# Patient Record
Sex: Female | Born: 1940
Health system: Southern US, Community
[De-identification: ages and names within clinical notes are randomized; demographics above are authoritative.]

## PROBLEM LIST (undated history)

## (undated) DIAGNOSIS — G63 Polyneuropathy in diseases classified elsewhere: Secondary | ICD-10-CM

## (undated) DIAGNOSIS — M2550 Pain in unspecified joint: Secondary | ICD-10-CM

## (undated) DIAGNOSIS — E785 Hyperlipidemia, unspecified: Secondary | ICD-10-CM

## (undated) DIAGNOSIS — I10 Essential (primary) hypertension: Secondary | ICD-10-CM

## (undated) DIAGNOSIS — I639 Cerebral infarction, unspecified: Secondary | ICD-10-CM

## (undated) DIAGNOSIS — G5600 Carpal tunnel syndrome, unspecified upper limb: Secondary | ICD-10-CM

## (undated) DIAGNOSIS — E039 Hypothyroidism, unspecified: Secondary | ICD-10-CM

## (undated) DIAGNOSIS — M1990 Unspecified osteoarthritis, unspecified site: Secondary | ICD-10-CM

## (undated) DIAGNOSIS — Z860100 Personal history of colon polyps, unspecified: Secondary | ICD-10-CM

## (undated) DIAGNOSIS — K6289 Other specified diseases of anus and rectum: Secondary | ICD-10-CM

## (undated) DIAGNOSIS — M129 Arthropathy, unspecified: Secondary | ICD-10-CM

## (undated) DIAGNOSIS — K222 Esophageal obstruction: Secondary | ICD-10-CM

## (undated) DIAGNOSIS — J4489 Other specified chronic obstructive pulmonary disease: Secondary | ICD-10-CM

## (undated) DIAGNOSIS — G4762 Sleep related leg cramps: Secondary | ICD-10-CM

## (undated) DIAGNOSIS — K219 Gastro-esophageal reflux disease without esophagitis: Secondary | ICD-10-CM

## (undated) DIAGNOSIS — F411 Generalized anxiety disorder: Secondary | ICD-10-CM

## (undated) DIAGNOSIS — D126 Benign neoplasm of colon, unspecified: Secondary | ICD-10-CM

## (undated) DIAGNOSIS — C50919 Malignant neoplasm of unspecified site of unspecified female breast: Secondary | ICD-10-CM

## (undated) DIAGNOSIS — K648 Other hemorrhoids: Secondary | ICD-10-CM

## (undated) DIAGNOSIS — G4730 Sleep apnea, unspecified: Secondary | ICD-10-CM

## (undated) DIAGNOSIS — Z5189 Encounter for other specified aftercare: Secondary | ICD-10-CM

## (undated) DIAGNOSIS — J449 Chronic obstructive pulmonary disease, unspecified: Secondary | ICD-10-CM

## (undated) DIAGNOSIS — R413 Other amnesia: Secondary | ICD-10-CM

## (undated) DIAGNOSIS — H269 Unspecified cataract: Secondary | ICD-10-CM

## (undated) DIAGNOSIS — B029 Zoster without complications: Secondary | ICD-10-CM

## (undated) DIAGNOSIS — M8580 Other specified disorders of bone density and structure, unspecified site: Secondary | ICD-10-CM

## (undated) HISTORY — PX: UPPER GASTROINTESTINAL ENDOSCOPY: SHX188

## (undated) HISTORY — PX: BACK SURGERY: SHX140

## (undated) HISTORY — DX: Pain in unspecified joint: M25.50

## (undated) HISTORY — PX: THYROIDECTOMY: SHX17

## (undated) HISTORY — PX: MASTECTOMY: SHX3

## (undated) HISTORY — DX: Other specified chronic obstructive pulmonary disease: J44.89

## (undated) HISTORY — DX: Other specified disorders of bone density and structure, unspecified site: M85.80

## (undated) HISTORY — DX: Unspecified cataract: H26.9

## (undated) HISTORY — DX: Benign neoplasm of colon, unspecified: D12.6

## (undated) HISTORY — DX: Other amnesia: R41.3

## (undated) HISTORY — DX: Chronic obstructive pulmonary disease, unspecified: J44.9

## (undated) HISTORY — DX: Gastro-esophageal reflux disease without esophagitis: K21.9

## (undated) HISTORY — DX: Hypothyroidism, unspecified: E03.9

## (undated) HISTORY — DX: Other hemorrhoids: K64.8

## (undated) HISTORY — PX: TUBAL LIGATION: SHX77

## (undated) HISTORY — DX: Personal history of colon polyps, unspecified: Z86.0100

## (undated) HISTORY — DX: Personal history of colonic polyps: Z86.010

## (undated) HISTORY — DX: Generalized anxiety disorder: F41.1

## (undated) HISTORY — PX: BREAST LUMPECTOMY: SHX2

## (undated) HISTORY — DX: Carpal tunnel syndrome, unspecified upper limb: G56.00

## (undated) HISTORY — DX: Arthropathy, unspecified: M12.9

## (undated) HISTORY — DX: Zoster without complications: B02.9

## (undated) HISTORY — DX: Hyperlipidemia, unspecified: E78.5

## (undated) HISTORY — DX: Malignant neoplasm of unspecified site of unspecified female breast: C50.919

## (undated) HISTORY — PX: EYE SURGERY: SHX253

## (undated) HISTORY — DX: Other specified diseases of anus and rectum: K62.89

## (undated) HISTORY — DX: Esophageal obstruction: K22.2

## (undated) HISTORY — DX: Polyneuropathy in diseases classified elsewhere: G63

## (undated) HISTORY — PX: POLYPECTOMY: SHX149

## (undated) HISTORY — DX: Encounter for other specified aftercare: Z51.89

---
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
## 1997-06-04 ENCOUNTER — Ambulatory Visit (HOSPITAL_COMMUNITY): Admission: RE | Admit: 1997-06-04 | Discharge: 1997-06-04 | Payer: Self-pay | Admitting: Oncology

## 1997-06-17 ENCOUNTER — Ambulatory Visit (HOSPITAL_COMMUNITY): Admission: RE | Admit: 1997-06-17 | Discharge: 1997-06-17 | Payer: Self-pay | Admitting: Oncology

## 1998-01-15 ENCOUNTER — Ambulatory Visit (HOSPITAL_BASED_OUTPATIENT_CLINIC_OR_DEPARTMENT_OTHER): Admission: RE | Admit: 1998-01-15 | Discharge: 1998-01-15 | Payer: Self-pay | Admitting: *Deleted

## 1998-05-16 ENCOUNTER — Other Ambulatory Visit: Admission: RE | Admit: 1998-05-16 | Discharge: 1998-05-16 | Payer: Self-pay | Admitting: *Deleted

## 1999-06-15 ENCOUNTER — Encounter (INDEPENDENT_AMBULATORY_CARE_PROVIDER_SITE_OTHER): Payer: Self-pay | Admitting: Specialist

## 1999-06-15 ENCOUNTER — Ambulatory Visit (HOSPITAL_COMMUNITY): Admission: RE | Admit: 1999-06-15 | Discharge: 1999-06-15 | Payer: Self-pay | Admitting: Gastroenterology

## 2000-06-06 ENCOUNTER — Other Ambulatory Visit: Admission: RE | Admit: 2000-06-06 | Discharge: 2000-06-06 | Payer: Self-pay | Admitting: Family Medicine

## 2000-07-12 ENCOUNTER — Encounter: Admission: RE | Admit: 2000-07-12 | Discharge: 2000-07-12 | Payer: Self-pay | Admitting: Family Medicine

## 2000-07-12 ENCOUNTER — Ambulatory Visit (HOSPITAL_COMMUNITY): Admission: RE | Admit: 2000-07-12 | Discharge: 2000-07-12 | Payer: Self-pay | Admitting: Family Medicine

## 2000-07-12 ENCOUNTER — Encounter: Payer: Self-pay | Admitting: Family Medicine

## 2001-07-13 ENCOUNTER — Encounter: Admission: RE | Admit: 2001-07-13 | Discharge: 2001-07-13 | Payer: Self-pay | Admitting: Family Medicine

## 2001-07-13 ENCOUNTER — Encounter: Payer: Self-pay | Admitting: Family Medicine

## 2001-08-30 ENCOUNTER — Other Ambulatory Visit: Admission: RE | Admit: 2001-08-30 | Discharge: 2001-08-30 | Payer: Self-pay | Admitting: Family Medicine

## 2002-01-09 ENCOUNTER — Ambulatory Visit (HOSPITAL_COMMUNITY): Admission: RE | Admit: 2002-01-09 | Discharge: 2002-01-09 | Payer: Self-pay | Admitting: Gastroenterology

## 2002-04-10 ENCOUNTER — Encounter: Payer: Self-pay | Admitting: Family Medicine

## 2002-04-10 ENCOUNTER — Ambulatory Visit (HOSPITAL_COMMUNITY): Admission: RE | Admit: 2002-04-10 | Discharge: 2002-04-10 | Payer: Self-pay | Admitting: Family Medicine

## 2002-09-07 ENCOUNTER — Other Ambulatory Visit: Admission: RE | Admit: 2002-09-07 | Discharge: 2002-09-07 | Payer: Self-pay | Admitting: Family Medicine

## 2003-09-23 ENCOUNTER — Other Ambulatory Visit: Admission: RE | Admit: 2003-09-23 | Discharge: 2003-09-23 | Payer: Self-pay | Admitting: Family Medicine

## 2004-02-03 ENCOUNTER — Ambulatory Visit: Payer: Self-pay | Admitting: Oncology

## 2004-03-05 ENCOUNTER — Ambulatory Visit (HOSPITAL_COMMUNITY): Admission: RE | Admit: 2004-03-05 | Discharge: 2004-03-05 | Payer: Self-pay | Admitting: Family Medicine

## 2004-03-13 ENCOUNTER — Ambulatory Visit (HOSPITAL_COMMUNITY): Admission: RE | Admit: 2004-03-13 | Discharge: 2004-03-13 | Payer: Self-pay | Admitting: Family Medicine

## 2004-08-05 ENCOUNTER — Ambulatory Visit: Payer: Self-pay | Admitting: Oncology

## 2004-10-07 ENCOUNTER — Other Ambulatory Visit: Admission: RE | Admit: 2004-10-07 | Discharge: 2004-10-07 | Payer: Self-pay | Admitting: Family Medicine

## 2005-08-03 ENCOUNTER — Ambulatory Visit: Payer: Self-pay | Admitting: Oncology

## 2005-09-29 ENCOUNTER — Ambulatory Visit: Payer: Self-pay | Admitting: Oncology

## 2005-10-01 LAB — CBC WITH DIFFERENTIAL/PLATELET
BASO%: 0.3 % (ref 0.0–2.0)
EOS%: 2.7 % (ref 0.0–7.0)
HCT: 36.4 % (ref 34.8–46.6)
LYMPH%: 29.1 % (ref 14.0–48.0)
MCH: 31.4 pg (ref 26.0–34.0)
MCHC: 34.3 g/dL (ref 32.0–36.0)
MCV: 91.4 fL (ref 81.0–101.0)
MONO#: 0.4 10*3/uL (ref 0.1–0.9)
NEUT%: 60.9 % (ref 39.6–76.8)
Platelets: 199 10*3/uL (ref 145–400)

## 2005-10-01 LAB — COMPREHENSIVE METABOLIC PANEL
ALT: 14 U/L (ref 0–40)
CO2: 27 mEq/L (ref 19–32)
Creatinine, Ser: 0.63 mg/dL (ref 0.40–1.20)
Total Bilirubin: 0.5 mg/dL (ref 0.3–1.2)

## 2005-10-01 LAB — LACTATE DEHYDROGENASE: LDH: 190 U/L (ref 94–250)

## 2005-11-03 ENCOUNTER — Other Ambulatory Visit: Admission: RE | Admit: 2005-11-03 | Discharge: 2005-11-03 | Payer: Self-pay | Admitting: Family Medicine

## 2006-09-29 ENCOUNTER — Ambulatory Visit: Payer: Self-pay | Admitting: Oncology

## 2006-09-30 LAB — CBC WITH DIFFERENTIAL/PLATELET
Basophils Absolute: 0 10*3/uL (ref 0.0–0.1)
Eosinophils Absolute: 0.2 10*3/uL (ref 0.0–0.5)
HCT: 36.3 % (ref 34.8–46.6)
HGB: 12.8 g/dL (ref 11.6–15.9)
MCH: 31.1 pg (ref 26.0–34.0)
MONO#: 0.4 10*3/uL (ref 0.1–0.9)
NEUT%: 61.4 % (ref 39.6–76.8)
WBC: 5.4 10*3/uL (ref 3.9–10.0)
lymph#: 1.4 10*3/uL (ref 0.9–3.3)

## 2006-09-30 LAB — LACTATE DEHYDROGENASE: LDH: 189 U/L (ref 94–250)

## 2006-09-30 LAB — COMPREHENSIVE METABOLIC PANEL
BUN: 14 mg/dL (ref 6–23)
CO2: 25 mEq/L (ref 19–32)
Calcium: 8.8 mg/dL (ref 8.4–10.5)
Chloride: 107 mEq/L (ref 96–112)
Creatinine, Ser: 0.57 mg/dL (ref 0.40–1.20)
Glucose, Bld: 97 mg/dL (ref 70–99)

## 2007-01-08 ENCOUNTER — Encounter: Payer: Self-pay | Admitting: Gastroenterology

## 2007-02-07 ENCOUNTER — Ambulatory Visit: Payer: Self-pay | Admitting: Gastroenterology

## 2007-02-07 LAB — CONVERTED CEMR LAB
ALT: 17 units/L (ref 0–35)
AST: 23 units/L (ref 0–37)
BUN: 15 mg/dL (ref 6–23)
Basophils Absolute: 0.1 10*3/uL (ref 0.0–0.1)
Bilirubin, Direct: 0.2 mg/dL (ref 0.0–0.3)
Calcium: 9.6 mg/dL (ref 8.4–10.5)
Creatinine, Ser: 0.6 mg/dL (ref 0.4–1.2)
Folate: >20 ng/mL
Glucose, Bld: 89 mg/dL (ref 70–99)
HCT: 39.7 % (ref 36.0–46.0)
Hemoglobin: 13.1 g/dL (ref 12.0–15.0)
Iron: 72 ug/dL (ref 42–145)
Monocytes Absolute: 0.5 10*3/uL (ref 0.2–0.7)
Monocytes Relative: 7.8 % (ref 3.0–11.0)
Neutrophils Relative %: 53.6 % (ref 43.0–77.0)
Platelets: 190 10*3/uL (ref 150–400)
Sodium: 140 meq/L (ref 135–145)
Transferrin: 261 mg/dL (ref >=212.0)

## 2007-02-15 ENCOUNTER — Ambulatory Visit: Payer: Self-pay | Admitting: Gastroenterology

## 2007-09-27 ENCOUNTER — Ambulatory Visit: Payer: Self-pay | Admitting: Oncology

## 2007-09-29 LAB — CBC WITH DIFFERENTIAL/PLATELET
BASO%: 0.6 % (ref 0.0–2.0)
LYMPH%: 35.6 % (ref 14.0–48.0)
MCHC: 34.3 g/dL (ref 32.0–36.0)
MCV: 90.5 fL (ref 81.0–101.0)
MONO%: 8.5 % (ref 0.0–13.0)
Platelets: 184 10*3/uL (ref 145–400)
RBC: 4.19 10*6/uL (ref 3.70–5.32)
RDW: 13.7 % (ref 11.3–14.5)
WBC: 4.1 10*3/uL (ref 3.9–10.0)

## 2007-09-29 LAB — COMPREHENSIVE METABOLIC PANEL
ALT: 15 U/L (ref 0–35)
AST: 19 U/L (ref 0–37)
Alkaline Phosphatase: 39 U/L (ref 39–117)
Sodium: 140 mEq/L (ref 135–145)
Total Bilirubin: 0.8 mg/dL (ref 0.3–1.2)
Total Protein: 6.7 g/dL (ref 6.0–8.3)

## 2007-10-16 ENCOUNTER — Encounter: Admission: RE | Admit: 2007-10-16 | Discharge: 2007-10-16 | Payer: Self-pay | Admitting: Orthopedic Surgery

## 2007-11-06 ENCOUNTER — Ambulatory Visit: Payer: Self-pay | Admitting: Cardiology

## 2007-12-13 ENCOUNTER — Ambulatory Visit: Payer: Self-pay | Admitting: Cardiology

## 2008-05-18 ENCOUNTER — Encounter: Payer: Self-pay | Admitting: Gastroenterology

## 2008-05-20 ENCOUNTER — Encounter: Payer: Self-pay | Admitting: Gastroenterology

## 2008-05-21 ENCOUNTER — Telehealth: Payer: Self-pay | Admitting: Gastroenterology

## 2008-05-22 DIAGNOSIS — R51 Headache: Secondary | ICD-10-CM | POA: Insufficient documentation

## 2008-05-22 DIAGNOSIS — E785 Hyperlipidemia, unspecified: Secondary | ICD-10-CM | POA: Insufficient documentation

## 2008-05-22 DIAGNOSIS — R1319 Other dysphagia: Secondary | ICD-10-CM | POA: Insufficient documentation

## 2008-05-22 DIAGNOSIS — K648 Other hemorrhoids: Secondary | ICD-10-CM | POA: Insufficient documentation

## 2008-05-22 DIAGNOSIS — K219 Gastro-esophageal reflux disease without esophagitis: Secondary | ICD-10-CM | POA: Insufficient documentation

## 2008-05-22 DIAGNOSIS — Z860100 Personal history of colon polyps, unspecified: Secondary | ICD-10-CM | POA: Insufficient documentation

## 2008-05-22 DIAGNOSIS — C50412 Malignant neoplasm of upper-outer quadrant of left female breast: Secondary | ICD-10-CM

## 2008-05-22 DIAGNOSIS — R519 Headache, unspecified: Secondary | ICD-10-CM | POA: Insufficient documentation

## 2008-05-24 ENCOUNTER — Ambulatory Visit: Payer: Self-pay | Admitting: Gastroenterology

## 2008-05-24 DIAGNOSIS — M129 Arthropathy, unspecified: Secondary | ICD-10-CM | POA: Insufficient documentation

## 2008-05-24 DIAGNOSIS — J449 Chronic obstructive pulmonary disease, unspecified: Secondary | ICD-10-CM

## 2008-05-24 DIAGNOSIS — K5900 Constipation, unspecified: Secondary | ICD-10-CM | POA: Insufficient documentation

## 2008-05-24 DIAGNOSIS — F411 Generalized anxiety disorder: Secondary | ICD-10-CM | POA: Insufficient documentation

## 2008-05-24 DIAGNOSIS — M818 Other osteoporosis without current pathological fracture: Secondary | ICD-10-CM | POA: Insufficient documentation

## 2008-05-29 ENCOUNTER — Ambulatory Visit: Payer: Self-pay | Admitting: Gastroenterology

## 2008-09-25 ENCOUNTER — Ambulatory Visit: Payer: Self-pay | Admitting: Oncology

## 2008-09-27 ENCOUNTER — Encounter: Payer: Self-pay | Admitting: Gastroenterology

## 2008-09-27 ENCOUNTER — Telehealth (INDEPENDENT_AMBULATORY_CARE_PROVIDER_SITE_OTHER): Payer: Self-pay | Admitting: *Deleted

## 2008-09-27 LAB — COMPREHENSIVE METABOLIC PANEL
AST: 19 U/L (ref 0–37)
Albumin: 4.5 g/dL (ref 3.5–5.2)
Alkaline Phosphatase: 46 U/L (ref 39–117)
Calcium: 9.2 mg/dL (ref 8.4–10.5)
Chloride: 106 mEq/L (ref 96–112)
Glucose, Bld: 78 mg/dL (ref 70–99)
Potassium: 4.3 mEq/L (ref 3.5–5.3)
Sodium: 140 mEq/L (ref 135–145)
Total Protein: 6.8 g/dL (ref 6.0–8.3)

## 2008-09-27 LAB — CBC WITH DIFFERENTIAL/PLATELET
Eosinophils Absolute: 0.2 10*3/uL (ref 0.0–0.5)
HGB: 12.5 g/dL (ref 11.6–15.9)
MCV: 89.7 fL (ref 79.5–101.0)
MONO#: 0.5 10*3/uL (ref 0.1–0.9)
MONO%: 9.3 % (ref 0.0–14.0)
NEUT#: 2.8 10*3/uL (ref 1.5–6.5)
RBC: 4.01 10*6/uL (ref 3.70–5.45)
RDW: 13.6 % (ref 11.2–14.5)
WBC: 4.9 10*3/uL (ref 3.9–10.3)
lymph#: 1.5 10*3/uL (ref 0.9–3.3)

## 2008-11-22 ENCOUNTER — Ambulatory Visit (HOSPITAL_BASED_OUTPATIENT_CLINIC_OR_DEPARTMENT_OTHER): Admission: RE | Admit: 2008-11-22 | Discharge: 2008-11-22 | Payer: Self-pay | Admitting: Family Medicine

## 2008-11-24 ENCOUNTER — Ambulatory Visit: Payer: Self-pay | Admitting: Internal Medicine

## 2009-03-25 ENCOUNTER — Encounter: Admission: RE | Admit: 2009-03-25 | Discharge: 2009-03-25 | Payer: Self-pay | Admitting: Family Medicine

## 2009-05-08 ENCOUNTER — Ambulatory Visit: Payer: Self-pay | Admitting: Oncology

## 2009-05-09 ENCOUNTER — Encounter: Payer: Self-pay | Admitting: Gastroenterology

## 2009-05-23 ENCOUNTER — Encounter (HOSPITAL_COMMUNITY): Admission: RE | Admit: 2009-05-23 | Discharge: 2009-08-21 | Payer: Self-pay | Admitting: Oncology

## 2009-09-25 ENCOUNTER — Ambulatory Visit: Payer: Self-pay | Admitting: Oncology

## 2009-10-27 ENCOUNTER — Ambulatory Visit: Payer: Self-pay | Admitting: Oncology

## 2009-10-27 ENCOUNTER — Encounter: Payer: Self-pay | Admitting: Gastroenterology

## 2009-10-27 LAB — COMPREHENSIVE METABOLIC PANEL
AST: 25 U/L (ref 0–37)
BUN: 19 mg/dL (ref 6–23)
CO2: 27 mEq/L (ref 19–32)
Chloride: 102 mEq/L (ref 96–112)
Potassium: 4.1 mEq/L (ref 3.5–5.3)
Total Protein: 6.8 g/dL (ref 6.0–8.3)

## 2009-10-27 LAB — CBC WITH DIFFERENTIAL/PLATELET
BASO%: 0.5 % (ref 0.0–2.0)
Basophils Absolute: 0 10*3/uL (ref 0.0–0.1)
HCT: 39.2 % (ref 34.8–46.6)
HGB: 13.4 g/dL (ref 11.6–15.9)
MCH: 31.2 pg (ref 25.1–34.0)
MCV: 91.3 fL (ref 79.5–101.0)
MONO%: 7.1 % (ref 0.0–14.0)
NEUT#: 3.5 10*3/uL (ref 1.5–6.5)
RDW: 13.5 % (ref 11.2–14.5)
lymph#: 1.6 10*3/uL (ref 0.9–3.3)

## 2010-01-13 ENCOUNTER — Encounter
Admission: RE | Admit: 2010-01-13 | Discharge: 2010-01-13 | Payer: Self-pay | Source: Home / Self Care | Attending: Orthopedic Surgery | Admitting: Orthopedic Surgery

## 2010-02-03 NOTE — Letter (Signed)
Summary: [facility]  [facility]   Imported By: Sherian Rein 11/13/2009 08:45:32  _____________________________________________________________________  External Attachment:    Type:   Image     Comment:   External Document

## 2010-02-03 NOTE — Letter (Signed)
[facility]   Imported By: Sherian Rein 06/04/2009 14:57:47  _____________________________________________________________________  External Attachment:    Type:   Image     Comment:   External Document

## 2010-02-18 ENCOUNTER — Ambulatory Visit: Payer: Medicare Other | Attending: Orthopedic Surgery | Admitting: Physical Therapy

## 2010-02-18 DIAGNOSIS — R5381 Other malaise: Secondary | ICD-10-CM | POA: Insufficient documentation

## 2010-02-18 DIAGNOSIS — M25619 Stiffness of unspecified shoulder, not elsewhere classified: Secondary | ICD-10-CM | POA: Insufficient documentation

## 2010-02-18 DIAGNOSIS — R293 Abnormal posture: Secondary | ICD-10-CM | POA: Insufficient documentation

## 2010-02-18 DIAGNOSIS — IMO0001 Reserved for inherently not codable concepts without codable children: Secondary | ICD-10-CM | POA: Insufficient documentation

## 2010-02-18 DIAGNOSIS — M25519 Pain in unspecified shoulder: Secondary | ICD-10-CM | POA: Insufficient documentation

## 2010-02-26 ENCOUNTER — Ambulatory Visit: Payer: Medicare Other | Admitting: Physical Therapy

## 2010-03-05 ENCOUNTER — Ambulatory Visit: Payer: Medicare Other | Attending: Orthopedic Surgery | Admitting: Physical Therapy

## 2010-03-05 DIAGNOSIS — R293 Abnormal posture: Secondary | ICD-10-CM | POA: Insufficient documentation

## 2010-03-05 DIAGNOSIS — M25519 Pain in unspecified shoulder: Secondary | ICD-10-CM | POA: Insufficient documentation

## 2010-03-05 DIAGNOSIS — M25619 Stiffness of unspecified shoulder, not elsewhere classified: Secondary | ICD-10-CM | POA: Insufficient documentation

## 2010-03-05 DIAGNOSIS — IMO0001 Reserved for inherently not codable concepts without codable children: Secondary | ICD-10-CM | POA: Insufficient documentation

## 2010-03-05 DIAGNOSIS — R5381 Other malaise: Secondary | ICD-10-CM | POA: Insufficient documentation

## 2010-04-28 ENCOUNTER — Other Ambulatory Visit: Payer: Self-pay | Admitting: Family Medicine

## 2010-04-28 DIAGNOSIS — Z1231 Encounter for screening mammogram for malignant neoplasm of breast: Secondary | ICD-10-CM

## 2010-05-18 ENCOUNTER — Other Ambulatory Visit: Payer: Self-pay | Admitting: Family Medicine

## 2010-05-18 ENCOUNTER — Ambulatory Visit
Admission: RE | Admit: 2010-05-18 | Discharge: 2010-05-18 | Disposition: A | Discharge status: Home / Self Care | Payer: Medicare Other | Source: Ambulatory Visit | Attending: Family Medicine | Admitting: Family Medicine

## 2010-05-18 DIAGNOSIS — Z1231 Encounter for screening mammogram for malignant neoplasm of breast: Secondary | ICD-10-CM

## 2010-05-19 NOTE — Assessment & Plan Note (Signed)
[facility]                            CARDIOLOGY OFFICE NOTE   NAME:[name], [name]                         MRN:          [id number]  DATE:12/13/2007                            DOB:          02-12-1940    PRIMARY CARE PHYSICIAN:  [name], MD   REASON FOR PRESENTATION:  Evaluate the patient with bradycardia and  fatigue.   HISTORY OF PRESENT ILLNESS:  The patient presents for followup of the  above.  At his last appointment, I did stop her atenolol to see if the  above symptoms would improve.  She states she is still fatigued.  However, she is not having any of the weak episodes which she was  having.  She thinks she is clearly improved.  Her blood pressure  actually has been going up.  She thinks it is averaging in the 166s  systolic and mid 40J diastolic.  She has not been having any  palpitations, presyncope, or syncope.  She has not been having any chest  discomfort, neck or arm discomfort.  She has had no new shortness of  breath.  Denies any PND or orthopnea.  She says that she will fall  asleep very quickly.  She does snore at night.   PAST MEDICAL HISTORY:  Hypothyroidism, breast cancer treated in 1988  with radiation chemotherapy and surgery, hyperlipidemia,  gastroesophageal reflux disease, hypertension recently diagnosed, back  surgery, mastectomy, thyroid resection as a teenager, and tubal  ligation.   ALLERGIES/INTOLERANCE:  DEMEROL, SULFA, and LIPITOR.   MEDICATIONS:  1. Synthroid 150 mcg daily.  2. Aspirin 81 mg daily.  3. Hydrochlorothiazide 12.5 mg daily.  4. Crestor 5 mg daily.  5. Actonel.  6. Calcium.  7. Fish oil.  8. Multivitamin.   REVIEW OF SYSTEMS:  As stated in the HPI and otherwise negative for  other systems.   PHYSICAL EXAMINATION:  GENERAL:  The patient is pleasant and in no  distress.  VITAL SIGNS:  Blood pressure 130/86, heart rate 73 and regular, weight  173 pounds, and body mass index 26.  HEENT:  Eyes unremarkable; pupils equal, round, and reactive to light;  fundi not visualized; oral mucosa unremarkable.  NECK:  No jugular venous distention at 45 degrees; carotid upstroke  brisk and symmetric; no bruits, no thyromegaly.  LYMPHATICS:  No cervical, axillary, or inguinal adenopathy.  LUNGS:  Clear to auscultation bilaterally.  BACK:  No costovertebral angle tenderness.  CHEST:  Unremarkable.  HEART:  PMI not displaced or sustained; S1 and S2 within normal limits;  no S3, no S4; no clicks, no rubs, no murmurs.  ABDOMEN:  Flat; positive bowel sounds, normal in frequency and pitch; no  bruits, no rebound, no guarding, no midline pulsatile mass; no  organomegaly.  SKIN:  No rashes, no nodules.  EXTREMITIES:  Pulses 2+ throughout; no edema, no cyanosis, and no  clubbing.  NEURO:  Oriented to person, place, and time; cranial nerves II through  XII grossly intact; motor grossly intact.   ASSESSMENT AND PLAN:  1. Weakness and bradycardia.  She is fatigued but  she is no longer      having the weak spells.  Her heart rate is increased.  I think      discontinuing the atenolol did help.  However, as expected, she has      had increase in her blood pressure.  2. Hypertension.  I will add lisinopril 10 mg daily.  We talked about      cough and angioedema.  She will get her BMET in 2 weeks.  3. Dyslipidemia per Dr. Christell Constant.  4. Fatigue.  The patient has fatigue.  She has daytime somnolence.      She snores.  Given all of this, a sleep study is indicated.  5. Hypothyroidism per Dr. Christell Constant.  6. Followup.  I would like to see her back in about 6 weeks or sooner      if needed.     Rollene Rotunda, MD, [facility]  Electronically Signed    JH/MedQ  DD: 12/13/2007  DT: 12/14/2007  Job #: [id number]   cc:   [name], M.D.

## 2010-05-19 NOTE — Assessment & Plan Note (Signed)
[facility]                            CARDIOLOGY OFFICE NOTE   NAME:[name], [name]                         MRN:          [id number]  DATE:11/06/2007                            DOB:          May 28, 1940    PRIMARY:  Leslie Penna, MD   REASON FOR PRESENTATION:  Bradycardia, fatigue.   HISTORY OF PRESENT ILLNESS:  The patient is a pleasant 70 year old white  female who I saw in 2004 for chest discomfort.  She had a stress  perfusion study which was negative at that time with an EF of 62%.  Apparently, she had some chest discomfort earlier this Spring and was  sent to [facility].  There she had a stress  perfusion study which she reports was normal.  She also had an  echocardiogram and said there were no significant abnormalities, though  I do not have these reports.  She was being considered for a sleep study  though I am not sure of the cause other than fatigue.   She is now requested to come back to [place].  She is most bothered by  feeling some episodes of weakness.  These will happen sporadically.  She  will feel weak all over.  The legs will be weak.  Seems to happen at  rest and is not reproducible.  She has not had any syncope, though about  a month ago she felt like she could have passed out.  Again, these are  sporadic.  Not happy in the particular time and the day.  They are  happening couple times a week.  She does not feel any rapid heart rates  though she will have an occasional skip.  She has been noted to have  persistently low heart rates.  Her blood pressures have actually been  creeping up.   The patient is active trying to walk and do yard work.  She is fatigued,  but denies any chest pressure, neck, or arm discomfort.  She has had no  shortness breath, PND, or orthopnea.   PAST MEDICAL HISTORY:  Hypothyroidism, breast cancer, diagnosed in 1998,  treated with chemotherapy, radiation, and surgery,  hyperlipidemia,  gastroesophageal reflux disease, hypertension recently.   PAST SURGICAL HISTORY:  Back surgery, mastectomy, thyroid resection as a  teenager, tubal ligation.   ALLERGIES:  Intolerance to DEMEROL, SULFA, and LIPITOR.   MEDICATIONS:  1. Synthroid 150 mcg daily.  2. Aspirin 81 mg daily.  3. Atenolol 25 mg b.i.d.  4. Crestor 5 mg daily.  5. Actonel 150 mg daily.  6. Calcium.  7. Fish oil.  8. Multivitamin.   SOCIAL HISTORY:  The patient is retired.  She is divorced.  She has 2  children.  She has never smoked cigarettes.  She does not drink alcohol.   FAMILY HISTORY:  Noncontributory for early coronary artery disease.   REVIEW OF SYSTEMS:  As stated in the HPI and otherwise negative for  other systems.   PHYSICAL EXAMINATION:  GENERAL:  The patient is in no distress.  VITAL SIGNS:  Blood pressure 156/78,  heart rate 58 and regular, weight  177 pounds, body mass index 26.  HEENT:  Eyes unremarkable.  Pupils equal, round, and reactive to light,  fundi within normal limits, oral mucosa unremarkable.  NECK:  No jugular venous distention 45 degrees, carotid upstroke brisk  and symmetric, no bruits, no thyromegaly.  LYMPHATICS:  No cervical, axillary, inguinal adenopathy.  LUNGS:  Clear to auscultation bilaterally.  BACK:  No costovertebral angle tenderness.  CHEST:  Unremarkable.  HEART:  PMI not displaced or sustained, S1 and S2 within normal limits,  no S3, no S4, no clicks, no rubs, no murmurs.  ABDOMEN:  Flat, positive bowel sounds normal in frequency and pitch, no  bruits, rebound, guarding, or midline pulsatile mass.  No hepatomegaly,  no splenomegaly.  SKIN:  No rashes, no nodules.  EXTREMITIES:  2+ pulses throughout, no edema, no cyanosis, no clubbing.  NEURO:  Oriented to person, place, and time.  Cranial nerves II through  XII grossly intact, motor grossly intact.   EKG sinus rhythm, rate 57, axis within normal limits, intervals within  normal limits,  no acute ST-wave changes.   ASSESSMENT AND PLAN:  1. Weakness and bradycardia.  The first thing to do is get rid of her      beta-blocker.  She is going to need something for blood pressure.      We will adjust as below.  When she has been off the beta-blocker      for about a month, if she is still weak, I will apply a monitor.      She will call me in the interim to let me know if she is improving      or not.  I will also check her labs done at [facility] to      make sure these include her thyroid, magnesium, and potassium.  2. Hypertension.  Her blood pressure is running slightly high.  I am      going to start hydrochlorothiazide 12.5 mg daily.  The patient back      will order a BMET.  She is going to increase potassium containing      foods.  3. Dyslipidemia per Dr. Christell Constant.  4. Hypothyroidism.  She says this has been followed and she is      euthyroid.  I will confirm this      by looking at her labs as above.  5. Followup.  We will see the patient back in [place] 1 month or      sooner if needed.     Rollene Rotunda, MD, [facility]  Electronically Signed    JH/MedQ  DD: 11/06/2007  DT: 11/07/2007  Job #: [id number]   cc:   [name], M.D.

## 2010-05-19 NOTE — Assessment & Plan Note (Signed)
[facility]                         GASTROENTEROLOGY OFFICE NOTE   NAME:[name], Agresti                         MRN:          [id number]  DATE:02/07/2007                            DOB:          1940/04/03    [name] is a 70 year old white female retiree referred through the  courtesy of Dr. Christell Constant for evaluation of chronic acid reflux with recent-  onset dysphagia.   [name] has had years of acid reflux managed with over-the-counter  Zantac.  For the past 6 months she has had recurrent episodes of rather  severe chest pain and has been evaluated by Dr. Domingo Sep in [place]  with a nuclear stress test and cardiac evaluation.  This has Spence been  normal.  She has been felt to have acid reflux and has recently been on  Prilosec with some mild improvement.  However, she continues with  regurgitation, acid reflux and pain radiating to her back.  She has had  progressive dysphagia for solids but has not had a food impaction.  She  had no anorexia or weight loss.  She does have mild chronic constipation  and occasionally will use laxatives.  She has been followed by [name].  [name], M.D., and has had several colonoscopies and due for follow-up  exam at this time.  She denies melena or hematochezia or any specific  food intolerances.  She gives no history of gallbladder disease or  hepatobiliary problems, prior hepatitis or pancreatitis.   PAST MEDICAL HISTORY:  1. The patient had breast carcinoma and prior mastectomy with chemo-      radiation therapy in 1996.  She is followed by Dr. Arline Spence and has      been in remission for some time.  2. She does have chronic sinusitis and headaches.  3. Hypercholesterolemia.  4. Had a thyroidectomy some 50 years ago and has been on chronic      Synthroid therapy.   MEDICATIONS:  1. Synthroid 150 mcg a day.  2. Atenolol 50 mg a day.  3. Aspirin 81 mg a day.  4. Actonel 35 mg a week.  5. Zantac 75 mg p.r.n.  6.  Prilosec 20 mg a day.   She in the past has had reactions to DEMEROL, SULFA, and STATINS.   FAMILY HISTORY:  Remarkable for breast cancer.  There are no other known  familial illnesses.   SOCIAL HISTORY:  She is divorced and lives by herself and is retired.  Does not smoke or use ethanol.   REVIEW OF SYSTEMS:  Remarkable for some minor complaints such as chronic  low back pain, shortness of breath on exertion, mild urinary  incontinence.  She has not menstruated since 1996.  She denies current  cardiovascular or pulmonary complaints otherwise.  Review of systems  otherwise noncontributory.   EXAMINATION:  She is a healthy-appearing white female appearing younger  than her stated age.  She is 5 feet 9-1/2 and weighs 177 pounds.  Blood pressure 150/84 and  pulse is 56 and regular.  I cannot appreciate stigmata of chronic liver disease, thyromegaly or  lymphadenopathy.  CHEST:  Clear.  She is in a regular rhythm without murmurs, gallops or rubs.  There is no hepatosplenomegaly, abdominal masses or tenderness.  Bowel  sounds are normal.  Both extremities are unremarkable.  Mental status is clear.  RECTAL:  Deferred.   ASSESSMENT:  1. Chronic gastroesophageal reflux disease with probable peptic      stricture of the esophagus, rule out Barrett's mucosa.  2. History of chronic functional constipation.  3. History of recurrent colon polyps.  4. Chronic thyroid dysfunction with previous thyroidectomy, with      chronic Synthroid use.  5. Status post mastectomy for breast carcinoma with chemo-radiation in      1996.  6. History of mild osteoporosis, on Actonel.   RECOMMENDATIONS:  1. Reflux regimen reviewed with the patient and we will place her on      Aciphex 20 mg twice a day 30 minutes before breakfast and supper.  2. Outpatient endoscopy and probable dilatation.  3. Follow up colonoscopy exam at her convenience.  4. Check screening laboratory parameters as baseline.  5.  Continue other medications as per Dr. Christell Constant.     [name]. Leslie Motto, MD, [name], FAGA  Electronically Signed    DRP/MedQ  DD: 02/07/2007  DT: 02/08/2007  Job #: [id number]   cc:   [name], M.D.  Samul Dada, M.D.

## 2010-05-22 NOTE — Op Note (Signed)
   NAME:  [name], [name]                          ACCOUNT NO.:  [id number]   MEDICAL RECORD NO.:  [id number]                   PATIENT TYPE:  AMB   LOCATION:  ENDO                                 FACILITY:  [facility]   PHYSICIAN:  John C. Madilyn Fireman, M.D.                 DATE OF BIRTH:  1940/06/27   DATE OF PROCEDURE:  01/09/2002  DATE OF DISCHARGE:                                 OPERATIVE REPORT   PROCEDURE:  Colonoscopy.   INDICATION FOR PROCEDURE:  Heme-positive stools in a patient with history of  adenomatous colon polyps.   DESCRIPTION OF PROCEDURE:  The patient was placed in the left lateral  decubitus position and placed on the pulse monitor with continuous low-flow  oxygen delivered by nasal cannula.  She was sedated with 25 mcg IV fentanyl  and 4 mg IV Versed in addition to the medicine given for the previous EGD.  The Olympus video colonoscope was inserted into the rectum and advanced to  the cecum, confirmed by transillumination at McBurney's point and  visualization of the ileocecal valve and appendiceal orifice.  The prep was  excellent.  The cecum, ascending, transverse, descending, and sigmoid colon  all appeared normal with no masses, polyps, diverticula, or other mucosal  abnormalities.  The rectum likewise appeared normal, and retroflexed view of  the anus revealed no obvious internal hemorrhoids.  The colonoscope was then  withdrawn, and the patient returned to the recovery room in stable  condition.  She tolerated the procedure well, and there were no immediate  complications.   IMPRESSION:  Normal colonoscopy.   PLAN:  Repeat colonoscopy in five years.                                               John C. Madilyn Fireman, M.D.    JCH/MEDQ  D:  01/09/2002  T:  01/09/2002  Job:  [id number]   cc:   [name], M.D.  [address] [place]  [place] [address]  Fax: [phone]

## 2010-05-22 NOTE — Op Note (Signed)
   NAME:  [name], [name]                          ACCOUNT NO.:  [id number]   MEDICAL RECORD NO.:  [id number]                   PATIENT TYPE:  AMB   LOCATION:  ENDO                                 FACILITY:  [facility]   PHYSICIAN:  John C. Madilyn Fireman, M.D.                 DATE OF BIRTH:  1940/08/21   DATE OF PROCEDURE:  01/09/2002  DATE OF DISCHARGE:                                 OPERATIVE REPORT   PROCEDURE:  Esophagogastroduodenoscopy.   INDICATION FOR PROCEDURE:  Heme-positive stools.   DESCRIPTION OF PROCEDURE:  The patient was placed in the left lateral  decubitus position and placed on the pulse monitor with continuous low-flow  oxygen delivered by nasal cannula.  She was sedated with 75 mcg IV fentanyl  and 6 mg IV Versed.  The Olympus video endoscope was advanced under direct  vision into the oropharynx and esophagus.  The esophagus was straight and of  normal caliber with the squamocolumnar line at 38 cm above a 1 cm hiatal  hernia.  There was no visible ring, stricture, esophagitis, or other  abnormality of the GE junction.  The stomach was entered, and a small amount  of liquid secretions were suctioned from the fundus.  Retroflexed view of  the cardia confirmed the small hiatal hernia and was otherwise unremarkable.  The fundus and body appeared normal.  The antrum showed some diffuse  erythema and granularity with no focal erosions or exudate consistent with  mild antral gastritis.  A CLOtest was obtained.  The pylorus was nondeformed  and easily allowed passage of the endoscope tip in the duodenum.  Both the  bulb and second portion were well-inspected and appeared to be within normal  limits.  The scope was then withdrawn, and the patient returned to the  recovery room in stable condition.  She tolerated the procedure well, and  there were no immediate complications.   IMPRESSION:  1. Mild antral gastritis.  2. A 1 cm hiatal hernia.   PLAN:  Await CLOtest and will proceed  with colonoscopy as scheduled.                                               John C. Madilyn Fireman, M.D.    JCH/MEDQ  D:  01/09/2002  T:  01/09/2002  Job:  [id number]   cc:   [name], M.D.  [address] [place]  [place] [address]  Fax: [phone]

## 2010-05-22 NOTE — Op Note (Signed)
[facility]  Patient:    [name], [name]                       MRN: [id number] Proc. Date: 06/15/99 Adm. Date:  [id number] Disc. Date: [id number] Attending:  Louie Bun CC:         Monica Becton, M.D.             Samul Dada, M.D.                           Operative Report  PROCEDURE:  Colonoscopy with polypectomy.  INDICATIONS:  History of adenomatous colon polyps on initial colonoscopy three years ago.  DESCRIPTION OF PROCEDURE:  The patient was placed in the left lateral decubitus  position and placed on the pulse monitor with continuous low flow oxygen delivered by nasal cannula.  She was sedated with 75 mcg of IV fentanyl and 6 mg of IV Versed. The Olympus video colonoscope was inserted into the rectum and advanced to the cecum, confirmed by transillumination of McBurneys and visualization of the  ileocecal valve and appendiceal orifice.  The prep was excellent.  The cecum appeared normal.  In the ascending colon, there was seen 8 x 4 mm sessile polyp  which was fulgurated by hot biopsy.  About 5 cm distal to this in the distal ascending colon was seen a 1 cm sessile polyp which was removed by snare.  The remainder of the ascending, transverse, descending, sigmoid, and rectum appeared normal without further polyps, masses, diverticuli, or other mucosal abnormalities. Retroflexed view of the anus revealed no obvious internal hemorrhoids.  The colonoscope was then withdrawn and the patient returned to the recovery room in  stable condition.  She tolerated the procedure well and there were no immediate  complications.  IMPRESSION:  Two ascending colon polyps removed with a combination of snare and  hot biopsy.  PLAN:  Await histology and will probably recommend repeat colonoscopy in three years. DD:  06/15/99 TD:  06/17/99 Job: [id number]

## 2010-06-16 ENCOUNTER — Other Ambulatory Visit: Payer: Self-pay | Admitting: Family Medicine

## 2010-06-16 DIAGNOSIS — R1011 Right upper quadrant pain: Secondary | ICD-10-CM

## 2010-06-18 LAB — IFOBT (OCCULT BLOOD): IFOBT: NEGATIVE

## 2010-06-30 ENCOUNTER — Ambulatory Visit (INDEPENDENT_AMBULATORY_CARE_PROVIDER_SITE_OTHER): Payer: Medicare Other | Admitting: Gastroenterology

## 2010-06-30 ENCOUNTER — Encounter: Payer: Self-pay | Admitting: Gastroenterology

## 2010-06-30 ENCOUNTER — Ambulatory Visit
Admission: RE | Admit: 2010-06-30 | Discharge: 2010-06-30 | Disposition: A | Discharge status: Home / Self Care | Payer: Medicare Other | Source: Ambulatory Visit | Attending: Family Medicine | Admitting: Family Medicine

## 2010-06-30 DIAGNOSIS — H9201 Otalgia, right ear: Secondary | ICD-10-CM

## 2010-06-30 DIAGNOSIS — R1011 Right upper quadrant pain: Secondary | ICD-10-CM

## 2010-06-30 DIAGNOSIS — H9209 Otalgia, unspecified ear: Secondary | ICD-10-CM

## 2010-06-30 DIAGNOSIS — R1310 Dysphagia, unspecified: Secondary | ICD-10-CM

## 2010-06-30 MED ORDER — HYOSCYAMINE SULFATE 0.125 MG SL SUBL: 0.1250 mg | SUBLINGUAL_TABLET | SUBLINGUAL | Status: AC | PRN

## 2010-06-30 NOTE — Patient Instructions (Signed)
We have made you an appt with Dr Hermelinda Medicus on 07/14/2010 you need to arrive at 12:45pm for a 1:00pm appt. His office number is [phone]. The address is [address] [place] [address]. Make sure you take a complete medication list, your insurance card and a copay if you have one.  Your Ferne Coe is scheduled for 07/03/2010 at [facility] you should arrive at 8:45am for a 9:00am appt and have nothing to eat or drink after midnight.  I have sent in Levsin to your pharmacy please take one by mouth as needed for abdominal pain. Stop by the front desk and a make a follow up appt to come back and see Dr Jarold Motto in 3 weeks.

## 2010-06-30 NOTE — Progress Notes (Signed)
This is a 70-year-old Caucasian female with continued dysphagia for solids and liquids. Been treated vigorously for acid reflux without response, and denies true reflux symptoms, and had a previous negative endoscopy several years ago. She also complains of pain in her right ear radiating into her right neck area. She otherwise has minor medical problem is doing well without anorexia or weight loss. She denies lower gastrointestinal or hepatobiliary complaints. Plan gallbladder ultrasound today which showed one 11 mm gallstone. I cannot get any symptoms of symptomatic cholelithiasis.   Current Medications, Allergies, Past Medical History, Past Surgical History, Family History and Social History were reviewed in [employer] record.  Pertinent Review of Systems Negative   Physical Exam: Awake alert no acute distress. Examination of the neck and oropharynx is unremarkable. Chest is clear cardiac exam is unremarkable. There is no hepatosplenomegaly, abdominal masses or tenderness. Bowel sounds are normal. There no gross neurological deficits. Peripheral extremities unremarkable. Mental status is normal.    Assessment and Plan: Dysphagia for solids and liquids certainly suggest a neuromuscular dysfunction of her oropharynx or upper esophagus. I've scheduled cine esophagram-barium swallow and ENT referral. She may need esophageal manometry and 24-hour pH probe testing. In the interim I will continue Prevacid with when necessary antacid use.  Please copy her primary care physician, referring physician, and pertinent subspecialists. Encounter Diagnoses  Name Primary?  Marland Kitchen Dysphagia Yes  . Right ear pain

## 2010-07-03 ENCOUNTER — Ambulatory Visit (HOSPITAL_COMMUNITY)
Admission: RE | Admit: 2010-07-03 | Discharge: 2010-07-03 | Disposition: A | Discharge status: Home / Self Care | Payer: Medicare Other | Source: Ambulatory Visit | Attending: Gastroenterology | Admitting: Gastroenterology

## 2010-07-03 DIAGNOSIS — K449 Diaphragmatic hernia without obstruction or gangrene: Secondary | ICD-10-CM | POA: Insufficient documentation

## 2010-07-03 DIAGNOSIS — R1310 Dysphagia, unspecified: Secondary | ICD-10-CM | POA: Insufficient documentation

## 2010-07-06 ENCOUNTER — Telehealth: Payer: Self-pay | Admitting: *Deleted

## 2010-07-06 NOTE — Telephone Encounter (Signed)
lmom for pt to call back

## 2010-07-06 NOTE — Telephone Encounter (Signed)
Message copied by Florene Glen on Mon Jul 06, 2010  3:54 PM ------      Message from: Mardella Layman      Created: Fri Jul 03, 2010  3:08 PM       NEEDS MANOMETRY AND 24H PH

## 2010-07-07 ENCOUNTER — Telehealth: Payer: Self-pay | Admitting: *Deleted

## 2010-07-07 NOTE — Telephone Encounter (Signed)
Pt scheduled for ESO. MANOMETRY and 24HR PH PROBE for 08/03/10 at 9am at [facility], npo after midnight, hold Prevacid. Arlys John Booking # [phone]. Notified pt and mailed her the instructions; pt stated understanding.

## 2010-07-07 NOTE — Telephone Encounter (Signed)
Message copied by Florene Glen on Tue Jul 07, 2010  2:43 PM ------      Message from: Jarold Motto, DAVID R      Created: Fri Jul 03, 2010  3:08 PM       NEEDS MANOMETRY AND 24H PH

## 2010-07-23 ENCOUNTER — Encounter: Payer: Self-pay | Admitting: Gastroenterology

## 2010-07-23 ENCOUNTER — Ambulatory Visit (INDEPENDENT_AMBULATORY_CARE_PROVIDER_SITE_OTHER): Payer: Medicare Other | Admitting: Gastroenterology

## 2010-07-23 DIAGNOSIS — R1310 Dysphagia, unspecified: Secondary | ICD-10-CM

## 2010-07-23 DIAGNOSIS — K5901 Slow transit constipation: Secondary | ICD-10-CM

## 2010-07-23 DIAGNOSIS — K219 Gastro-esophageal reflux disease without esophagitis: Secondary | ICD-10-CM

## 2010-07-23 DIAGNOSIS — R51 Headache: Secondary | ICD-10-CM

## 2010-07-23 NOTE — Progress Notes (Signed)
History of Present Illness: This is a 70 year old Caucasian female with right facial pain being evaluated by Dr. Hermelinda Medicus , ENT. The patient does have postherpetic neuralgia, and has a planned CT scan of her head. Her other problems revolve around continued dysphagia despite a negative endoscopy and barium swallow. She is on chronic PPI therapy for GERD, and denies acid reflux symptoms, anorexia or weight loss. Previous ultrasound the abdomen showed a small 11 mm gallstone it is felt to be asymptomatic. Her constipation is managed with MiraLax 8 ounces at bedtime. She is on thyroid replacement therapy and a variety of multivitamins. There is no history of anorexia, weight loss, other neurological symptomatology.    Current Medications, Allergies, Past Medical History, Past Surgical History, Family History and Social History were reviewed in [employer] record.   Assessment and plan: Mild dysphagia for pills probably reflecting a nonspecific esophageal motility disorder. Parents while it did not show any evidence of achalasia. I've scheduled her for esophageal manometry and 24-hour pH probe testing. She is to continue her ENT-neurological evaluation with Dr. Haroldine Laws. She may need neurological referral for consideration of Neurontin or Lyrica trials for her postherpetic neuralgia in her face.   Please copy her primary care physician, referring physician, and pertinent subspecialists. No diagnosis found.

## 2010-07-23 NOTE — Patient Instructions (Signed)
Please keep your scheduled appointment for esophageal manometry. We will make further disposition after that test has been completed.

## 2010-07-29 ENCOUNTER — Ambulatory Visit
Admission: RE | Admit: 2010-07-29 | Discharge: 2010-07-29 | Disposition: A | Discharge status: Home / Self Care | Payer: Medicare Other | Source: Ambulatory Visit | Attending: Otolaryngology | Admitting: Otolaryngology

## 2010-07-29 ENCOUNTER — Other Ambulatory Visit: Payer: Self-pay | Admitting: Otolaryngology

## 2010-08-03 ENCOUNTER — Ambulatory Visit (HOSPITAL_COMMUNITY)
Admission: RE | Admit: 2010-08-03 | Discharge: 2010-08-03 | Disposition: A | Discharge status: Home / Self Care | Payer: Medicare Other | Source: Ambulatory Visit | Attending: Gastroenterology | Admitting: Gastroenterology

## 2010-08-03 DIAGNOSIS — Z5309 Procedure and treatment not carried out because of other contraindication: Secondary | ICD-10-CM | POA: Insufficient documentation

## 2010-08-03 DIAGNOSIS — R1310 Dysphagia, unspecified: Secondary | ICD-10-CM | POA: Insufficient documentation

## 2011-01-01 ENCOUNTER — Other Ambulatory Visit: Payer: Self-pay | Admitting: Urology

## 2011-01-21 ENCOUNTER — Other Ambulatory Visit: Payer: Self-pay | Admitting: Physical Medicine and Rehabilitation

## 2011-01-21 DIAGNOSIS — M549 Dorsalgia, unspecified: Secondary | ICD-10-CM

## 2011-01-29 ENCOUNTER — Other Ambulatory Visit: Payer: Medicare Other

## 2011-02-02 ENCOUNTER — Ambulatory Visit
Admission: RE | Admit: 2011-02-02 | Discharge: 2011-02-02 | Disposition: A | Discharge status: Home / Self Care | Payer: Medicare Other | Source: Ambulatory Visit | Attending: Physical Medicine and Rehabilitation | Admitting: Physical Medicine and Rehabilitation

## 2011-02-02 DIAGNOSIS — M549 Dorsalgia, unspecified: Secondary | ICD-10-CM

## 2011-02-02 MED ORDER — GADOBENATE DIMEGLUMINE 529 MG/ML IV SOLN
15.0000 mL | Freq: Once | INTRAVENOUS | Status: AC | PRN
  Administered 2011-02-02: 15 mL via INTRAVENOUS

## 2011-03-27 ENCOUNTER — Emergency Department (HOSPITAL_COMMUNITY): Payer: Medicare Other

## 2011-03-27 ENCOUNTER — Emergency Department (HOSPITAL_COMMUNITY)
Admission: EM | Admit: 2011-03-27 | Discharge: 2011-03-27 | Disposition: A | Discharge status: Home / Self Care | Payer: Medicare Other | Attending: Emergency Medicine | Admitting: Emergency Medicine

## 2011-03-27 ENCOUNTER — Encounter (HOSPITAL_COMMUNITY): Payer: Self-pay | Admitting: Emergency Medicine

## 2011-03-27 DIAGNOSIS — IMO0002 Reserved for concepts with insufficient information to code with codable children: Secondary | ICD-10-CM | POA: Insufficient documentation

## 2011-03-27 DIAGNOSIS — R296 Repeated falls: Secondary | ICD-10-CM | POA: Insufficient documentation

## 2011-03-27 DIAGNOSIS — S73101A Unspecified sprain of right hip, initial encounter: Secondary | ICD-10-CM

## 2011-03-27 DIAGNOSIS — M79609 Pain in unspecified limb: Secondary | ICD-10-CM | POA: Insufficient documentation

## 2011-03-27 LAB — URINE MICROSCOPIC-ADD ON

## 2011-03-27 LAB — URINALYSIS, ROUTINE W REFLEX MICROSCOPIC
Nitrite: NEGATIVE
Protein, ur: NEGATIVE mg/dL
Specific Gravity, Urine: <1.005 — ABNORMAL LOW (ref 1.005–1.030)
Urobilinogen, UA: 0.2 mg/dL (ref 0.0–1.0)

## 2011-03-27 MED ORDER — ACETAMINOPHEN-CODEINE #3 300-30 MG PO TABS: 1.0000 | ORAL_TABLET | Freq: Four times a day (QID) | ORAL | Status: AC | PRN

## 2011-03-27 MED ORDER — ONDANSETRON HCL 4 MG PO TABS: 4.0000 mg | ORAL_TABLET | Freq: Four times a day (QID) | ORAL | Status: AC

## 2011-03-27 NOTE — ED Provider Notes (Signed)
Pt well appearing no distress Reports recent mechanical fall with pain in right LE No deformity noted BP 116/68  Pulse 74  Temp(Src) 97.6 F (36.4 C) (Oral)  Resp 20  Ht 5' 9.5" (1.765 m)  Wt 170 lb (77.111 kg)  BMI 24.74 kg/m2  SpO2 100% Xray pending at this time  Joya Gaskins, MD 03/27/11 1312

## 2011-03-27 NOTE — ED Notes (Signed)
Pt states she tripped and fell last week and the last couple of days has been c/o rt lower leg pain.

## 2011-03-27 NOTE — Discharge Instructions (Signed)
Hip Pain The hips join the upper legs to the lower pelvis. The bones, cartilage, tendons, and muscles of the hip joint perform a lot of work each day holding your body weight and allowing you to move around. Hip pain is a common symptom. It can range from a minor ache to severe pain on 1 or both hips. Pain may be felt on the inside of the hip joint near the groin, or the outside near the buttocks and upper thigh. There may be swelling or stiffness as well. It occurs more often when a person walks or performs activity. There are many reasons hip pain can develop. CAUSES  It is important to work with your caregiver to identify the cause since many conditions can impact the bones, cartilage, muscles, and tendons of the hips. Causes for hip pain include:  Broken (fractured) bones.   Separation of the thighbone from the hip socket (dislocation).   Torn cartilage of the hip joint.   Swelling (inflammation) of a tendon (tendonitis), the sac within the hip joint (bursitis), or a joint.   A weakening in the abdominal wall (hernia), affecting the nerves to the hip.   Arthritis in the hip joint or lining of the hip joint.   Pinched nerves in the back, hip, or upper thigh.   A bulging disc in the spine (herniated disc).   Rarely, bone infection or cancer.  DIAGNOSIS  The location of your hip pain will help your caregiver understand what may be causing the pain. A diagnosis is based on your medical history, your symptoms, results from your physical exam, and results from diagnostic tests. Diagnostic tests may include X-ray exams, a computerized magnetic scan (magnetic resonance imaging, MRI), or bone scan. TREATMENT  Treatment will depend on the cause of your hip pain. Treatment may include:  Limiting activities and resting until symptoms improve.   Crutches or other walking supports (a cane or brace).   Ice, elevation, and compression.   Physical therapy or home exercises.   Shoe inserts or  special shoes.   Losing weight.   Medications to reduce pain.   Undergoing surgery.  HOME CARE INSTRUCTIONS   Only take over-the-counter or prescription medicines for pain, discomfort, or fever as directed by your caregiver.   Put ice on the injured area:   Put ice in a plastic bag.   Place a towel between your skin and the bag.   Leave the ice on for 15 to 20 minutes at a time, 3 to 4 times a day.   Keep your leg raised (elevated) when possible to lessen swelling.   Avoid activities that cause pain.   Follow specific exercises as directed by your caregiver.   Sleep with a pillow between your legs on your most comfortable side.   Record how often you have hip pain, the location of the pain, and what it feels like. This information may be helpful to you and your caregiver.   Ask your caregiver about returning to work or sports and whether you should drive.   Follow up with your caregiver for further exams, therapy, or testing as directed.  SEEK MEDICAL CARE IF:   Your pain or swelling continues or worsens after 1 week.   You are feeling unwell or have chills.   You have increasing difficulty with walking.   You have a loss of sensation or other new symptoms.   You have questions or concerns.  SEEK IMMEDIATE MEDICAL CARE IF:   You   cannot put weight on the affected hip.   You have fallen.   You have a sudden increase in pain and swelling in your hip.   You have a fever.  MAKE SURE YOU:   Understand these instructions.   Will watch your condition.   Will get help right away if you are not doing well or get worse.  Document Released: 06/10/2009 Document Revised: 12/10/2010 Document Reviewed: 06/10/2009 [facility] Patient Information 2012 [place], [place].Hip Injury You have a been diagnosed with a hip injury. Falls can cause fractures to the pelvis and hip as well as very painful bruises. These are called 'hip pointers'. Muscle injuries, arthritis, sciatica, and  bursitis and can also cause severe pelvic or hip pain. An x-ray exam will usually show a fractured bone, but sometimes other studies like a CT scan or MRI may be needed to be certain about the diagnosis. All painful hip injuries should be treated like there might be a fracture until they are better or determined not to be fractures. Rest in bed over the next 3-4 days, or as long as you have severe pain. You should not bear weight on your injured hip. Use crutches or a walker. Ice packs can be applied to the injury site for 20 minutes every 2-4 hours for several days. Pain medicine may be needed to help you rest, especially at night.  A follow-up exam and further studies to determine the cause of your pain are important if your symptoms do not improve rapidly over the next week.  SEEK IMMEDIATE MEDICAL CARE IF:  You re-injure your hip.   You develop more pain, a fever, inability to walk, or other problems.  MAKE SURE YOU:   Understand these instructions.   Will watch your condition.   Will get help right away if you are not doing well or get worse.  Document Released: 01/29/2004 Document Revised: 12/10/2010 Document Reviewed: 04/11/2008 [facility] Patient Information 2012 [place], [place].

## 2011-04-02 ENCOUNTER — Emergency Department (HOSPITAL_COMMUNITY): Payer: Medicare Other

## 2011-04-02 ENCOUNTER — Emergency Department (HOSPITAL_COMMUNITY)
Admission: EM | Admit: 2011-04-02 | Discharge: 2011-04-02 | Disposition: A | Discharge status: Home / Self Care | Payer: Medicare Other | Attending: Emergency Medicine | Admitting: Emergency Medicine

## 2011-04-02 ENCOUNTER — Encounter (HOSPITAL_COMMUNITY): Payer: Self-pay | Admitting: General Practice

## 2011-04-02 DIAGNOSIS — K219 Gastro-esophageal reflux disease without esophagitis: Secondary | ICD-10-CM | POA: Insufficient documentation

## 2011-04-02 DIAGNOSIS — R51 Headache: Secondary | ICD-10-CM | POA: Insufficient documentation

## 2011-04-02 DIAGNOSIS — Z79899 Other long term (current) drug therapy: Secondary | ICD-10-CM | POA: Insufficient documentation

## 2011-04-02 DIAGNOSIS — R42 Dizziness and giddiness: Secondary | ICD-10-CM | POA: Insufficient documentation

## 2011-04-02 DIAGNOSIS — Z7982 Long term (current) use of aspirin: Secondary | ICD-10-CM | POA: Insufficient documentation

## 2011-04-02 DIAGNOSIS — I10 Essential (primary) hypertension: Secondary | ICD-10-CM | POA: Insufficient documentation

## 2011-04-02 DIAGNOSIS — J449 Chronic obstructive pulmonary disease, unspecified: Secondary | ICD-10-CM | POA: Insufficient documentation

## 2011-04-02 DIAGNOSIS — E785 Hyperlipidemia, unspecified: Secondary | ICD-10-CM | POA: Insufficient documentation

## 2011-04-02 DIAGNOSIS — E039 Hypothyroidism, unspecified: Secondary | ICD-10-CM | POA: Insufficient documentation

## 2011-04-02 DIAGNOSIS — J4489 Other specified chronic obstructive pulmonary disease: Secondary | ICD-10-CM | POA: Insufficient documentation

## 2011-04-02 DIAGNOSIS — G500 Trigeminal neuralgia: Secondary | ICD-10-CM

## 2011-04-02 DIAGNOSIS — Z853 Personal history of malignant neoplasm of breast: Secondary | ICD-10-CM | POA: Insufficient documentation

## 2011-04-02 LAB — COMPREHENSIVE METABOLIC PANEL
ALT: 18 U/L (ref 0–35)
AST: 24 U/L (ref 0–37)
Albumin: 4.1 g/dL (ref 3.5–5.2)
CO2: 29 mEq/L (ref 19–32)
Calcium: 9.8 mg/dL (ref 8.4–10.5)
Creatinine, Ser: 0.63 mg/dL (ref 0.50–1.10)
GFR calc non Af Amer: 89 mL/min — ABNORMAL LOW (ref >90)
Sodium: 137 mEq/L (ref 135–145)
Total Protein: 6.9 g/dL (ref 6.0–8.3)

## 2011-04-02 LAB — DIFFERENTIAL
Basophils Absolute: 0 10*3/uL (ref 0.0–0.1)
Basophils Relative: 0 % (ref 0–1)
Eosinophils Absolute: 0.2 10*3/uL (ref 0.0–0.7)
Eosinophils Relative: 3 % (ref 0–5)
Monocytes Absolute: 0.5 10*3/uL (ref 0.1–1.0)

## 2011-04-02 LAB — CBC
HCT: 39.5 % (ref 36.0–46.0)
MCH: 31.3 pg (ref 26.0–34.0)
MCHC: 34.7 g/dL (ref 30.0–36.0)
MCV: 90.2 fL (ref 78.0–100.0)
Platelets: 237 10*3/uL (ref 150–400)
RDW: 13.1 % (ref 11.5–15.5)
WBC: 7 10*3/uL (ref 4.0–10.5)

## 2011-04-02 MED ORDER — GABAPENTIN 300 MG PO CAPS: 300.0000 mg | ORAL_CAPSULE | Freq: Every day | ORAL | Status: DC

## 2011-04-02 NOTE — Discharge Instructions (Signed)

## 2011-04-02 NOTE — ED Notes (Signed)
Patient transported to CT 

## 2011-04-02 NOTE — Consult Note (Signed)
Requesting physician: Dr. Geoffery Lyons  Primary Care Physician: Rudi Heap, MD, MD  Reason for consultation: Headache, dizziness, evaluate for potential admission.   History of Present Illness: Very pleasant 71 y/o white woman with PMH significant for hypothyroidism, COPD, GERD and breast cancer presents to the hospital today for the above mentioned complaints. These episodes have been happening for about 1 year, but have gotten a lot worse over the winter. She says that they are very difficult to describe, but that several times a week she will have severe, sharp pain most times over her right ear going down into her jaw and sometimes around the eye that will only last for a few seconds. This only happens over the right side. She has noticed that they have become more frequent during this winter when she has been outside. She finally ended up going to the dentist because the pain in her jaw was so severe and after several visits they ended up doing a root canal in one of her teeth. Pain persisted and she returned to her dentist who told her that her pain was not coming from her teeth. Her PCP later referred her to an ENT, who told her that the problem was not in her ears either. She had an upcoming appointment with a neurologist in [facility] that she cancelled because she would prefer to keep all of her providers in [place].  When these episodes happen her blood pressure becomes very high and she feels like her heart is racing at times, especially when she is feeling very anxious. Occasionally she has become dizzy with these episodes as well. One of these episodes occurred yesterday and she finally came in to the hospital today to seek further evaluation. In the ED all of her workup has been negative, including normal labs, CT head, orthostatic vital signs. The EDP was going to send her home, however the family feels uncomfortable with this and is requesting a second opinion.  Allergies:     Allergies  Allergen Reactions  . Atorvastatin Other (See Comments)    Muscle cramps  . Meperidine Hcl Nausea And Vomiting    Makes me 'deathly' sick  . Statins     REACTION: Patient cannot tolerate due cramps  . Sulfonamide Derivatives Other (See Comments)    Unknown reaction      Past Medical History  Diagnosis Date  . Arthropathy, unspecified, site unspecified   . Anxiety state, unspecified   . Chronic airway obstruction, not elsewhere classified   . Headache   . Other and unspecified hyperlipidemia   . Malignant neoplasm of breast (female), unspecified site   . Unspecified hypothyroidism   . Personal history of colonic polyps     adenomatous  . Internal hemorrhoids without mention of complication   . Other dysphagia   . Esophageal reflux   . Pain in joint, multiple sites   . Anal or rectal pain   . Shingles   . Hiatal hernia   . Esophageal stricture     Past Surgical History  Procedure Date  . Back surgery     X2  . Mastectomy     left  . Breast lumpectomy   . Thyroidectomy   . Tubal ligation     Scheduled Meds:   Continuous Infusions:   PRN Meds:.    Social History:  reports that she has never smoked. She has never used smokeless tobacco. She reports that she does not drink alcohol or use illicit drugs.  History reviewed. No pertinent family history.  Review of Systems:  Negative except as mentioned in HPI.  Physical Exam: Blood pressure 136/81, pulse 71, temperature 98.7 F (37.1 C), temperature source Oral, resp. rate 16, weight 77.111 kg (170 lb), SpO2 96.00%. Gen: AAOx3, no acute distress. HEENT: [place], AT, PERRL, EOMI, moist mucous membranes. Neck: supple, no JVD, no lymphadenopathy, no bruits, no goiter. CV: RRR, no M/R/G. Lungs: CTA B Abdomen: S/NT/ND/+BS/no masses or organomegaly noted. Ext: no C/C/E, positive pedal pulses. Neuro: grossly intact and nonfocal.  Labs on Admission:  Results for orders placed during the hospital  encounter of 04/02/11 (from the past 48 hour(s))  CBC     Status: Normal   Collection Time   04/02/11 12:01 PM      Component Value Range Comment   WBC 7.0  4.0 - 10.5 (K/uL)    RBC 4.38  3.87 - 5.11 (MIL/uL)    Hemoglobin 13.7  12.0 - 15.0 (g/dL)    HCT 45.4  09.8 - 11.9 (%)    MCV 90.2  78.0 - 100.0 (fL)    MCH 31.3  26.0 - 34.0 (pg)    MCHC 34.7  30.0 - 36.0 (g/dL)    RDW 14.7  82.9 - 56.2 (%)    Platelets 237  150 - 400 (K/uL)   DIFFERENTIAL     Status: Normal   Collection Time   04/02/11 12:01 PM      Component Value Range Comment   Neutrophils Relative 69  43 - 77 (%)    Neutro Abs 4.8  1.7 - 7.7 (K/uL)    Lymphocytes Relative 21  12 - 46 (%)    Lymphs Abs 1.5  0.7 - 4.0 (K/uL)    Monocytes Relative 7  3 - 12 (%)    Monocytes Absolute 0.5  0.1 - 1.0 (K/uL)    Eosinophils Relative 3  0 - 5 (%)    Eosinophils Absolute 0.2  0.0 - 0.7 (K/uL)    Basophils Relative 0  0 - 1 (%)    Basophils Absolute 0.0  0.0 - 0.1 (K/uL)   COMPREHENSIVE METABOLIC PANEL     Status: Abnormal   Collection Time   04/02/11 12:01 PM      Component Value Range Comment   Sodium 137  135 - 145 (mEq/L)    Potassium 4.3  3.5 - 5.1 (mEq/L)    Chloride 102  96 - 112 (mEq/L)    CO2 29  19 - 32 (mEq/L)    Glucose, Bld 93  70 - 99 (mg/dL)    BUN 15  6 - 23 (mg/dL)    Creatinine, Ser 1.30  0.50 - 1.10 (mg/dL)    Calcium 9.8  8.4 - 10.5 (mg/dL)    Total Protein 6.9  6.0 - 8.3 (g/dL)    Albumin 4.1  3.5 - 5.2 (g/dL)    AST 24  0 - 37 (U/L)    ALT 18  0 - 35 (U/L)    Alkaline Phosphatase 53  39 - 117 (U/L)    Total Bilirubin 0.5  0.3 - 1.2 (mg/dL)    GFR calc non Af Amer 89 (*) >90 (mL/min)    GFR calc Af Amer >90  >90 (mL/min)     Radiological Exams on Admission: Ct Head Wo Contrast  04/02/2011  *RADIOLOGY REPORT*  Clinical Data: Nausea, dizziness, right facial/arm numbness  CT HEAD WITHOUT CONTRAST  Technique:  Contiguous axial images were obtained from the base of  the skull through the vertex without  contrast.  Comparison: None.  Findings: No evidence of parenchymal hemorrhage or extra-axial fluid collection. No mass lesion, mass effect, or midline shift.  No CT evidence of acute infarction.  Cerebral volume is age appropriate.  No ventriculomegaly.  Mild intracranial atherosclerosis.  The visualized paranasal sinuses are essentially clear. The mastoid air cells are unopacified.  No evidence of calvarial fracture.  IMPRESSION: No evidence of acute intracranial abnormality.  Original Report Authenticated By: Charline Bills, M.D.    Assessment/Plan  1. Trigeminal Neuralgia: I think this diagnosis most likely explains her episodes. The fact that they have worsened in the winter with exposure to cold air is also key in this diagnosis. She needs to followup with neurology. She would likely benefit from carbamazepine or a similar anticonvulsant, but I would feel more comfortable if neurology prescribed this. In the meantime, I will give her a prescription for neurontin. I believe it is safe to discharge her home from the ED today. Patient knows about importance of close followup with neurology. I have also given her some patient information from [employer] to Date about Trigeminal Neuralgia, and after she has read it, she really thinks this is her diagnosis as well.  Time Spent on Consultation: 60 minutes.  HERNANDEZ ACOSTA,Shuayb Schepers [facility]  [phone] 04/02/2011, 4:04 PM

## 2011-04-02 NOTE — ED Provider Notes (Addendum)
History     CSN: [id number]  Arrival date & time 04/02/11  1126   First MD Initiated Contact with Patient 04/02/11 1147      Chief Complaint  Patient presents with  . Dizziness    Nauses, dizziness noon yesterday, Bp 190s, Vomiting, Hip Pain, Took Asiprin and Ibuprofen and felt better, TODAY- leg sore, head/jaw pain "sharp pain from temple, 141/88 HR 70s, 9am acute onset of right side weakness, dizziness, nausea    (Consider location/radiation/quality/duration/timing/severity/associated sxs/prior treatment) HPI Comments: Patient states for the past year she has episodes of headache, dizziness, hypertension.  She has seen ent for this but did not give reason.  Has an upcoming appointment with Neurology.    Patient is a 71 y.o. female presenting with headaches. The history is provided by the patient.  Headache  This is a recurrent problem. The current episode started 1 to 2 hours ago. Episode frequency: once weekly or so. The problem has been resolved. Associated with: Pain in the right side of the head.    Past Medical History  Diagnosis Date  . Arthropathy, unspecified, site unspecified   . Anxiety state, unspecified   . Chronic airway obstruction, not elsewhere classified   . Headache   . Other and unspecified hyperlipidemia   . Malignant neoplasm of breast (female), unspecified site   . Unspecified hypothyroidism   . Personal history of colonic polyps     adenomatous  . Internal hemorrhoids without mention of complication   . Other dysphagia   . Esophageal reflux   . Pain in joint, multiple sites   . Anal or rectal pain   . Shingles   . Hiatal hernia   . Esophageal stricture     Past Surgical History  Procedure Date  . Back surgery     X2  . Mastectomy     left  . Breast lumpectomy   . Thyroidectomy   . Tubal ligation     History reviewed. No pertinent family history.  History  Substance Use Topics  . Smoking status: Never Smoker   . Smokeless tobacco:  Never Used  . Alcohol Use: No    OB History    Grav Para Term Preterm Abortions TAB SAB Ect Mult Living                  Review of Systems  Neurological: Positive for headaches.  All other systems reviewed and are negative.    Allergies  Atorvastatin; Meperidine hcl; Statins; and Sulfonamide derivatives  Home Medications   Current Outpatient Rx  Name Route Sig Dispense Refill  . ACETAMINOPHEN-CODEINE #3 300-30 MG PO TABS Oral Take 1-2 tablets by mouth every 6 (six) hours as needed for pain. 24 tablet 0  . ASPIRIN 81 MG PO TABS Oral Take 81 mg by mouth daily.      Marland Kitchen CALCIUM CARBONATE-VITAMIN D 500-125 MG-UNIT PO TABS Oral Take 1 capsule by mouth daily.      Marland Kitchen VITAMIN D3 2000 UNITS PO CAPS Oral Take 2,000 Units by mouth daily.      Marland Kitchen EZETIMIBE 10 MG PO TABS Oral Take 10 mg by mouth daily.      Marland Kitchen HYDROCHLOROTHIAZIDE 12.5 MG PO CAPS Oral Take 12.5 mg by mouth daily.      Marland Kitchen LANSOPRAZOLE 15 MG PO CPDR Oral Take 15 mg by mouth as needed. For heartburn    . LEVOTHYROXINE SODIUM 125 MCG PO TABS Oral Take 125 mcg by mouth daily.      Marland Kitchen  MULTIVITAMINS PO CAPS Oral Take 1 capsule by mouth daily.      Marland Kitchen FISH OIL 1000 MG PO CAPS Oral Take 1 capsule by mouth daily.      Marland Kitchen ONDANSETRON HCL 4 MG PO TABS Oral Take 1 tablet (4 mg total) by mouth every 6 (six) hours. Prn nausea 12 tablet 0  . POLYETHYLENE GLYCOL 3350 PO POWD Oral Take 17 g by mouth daily.        BP 136/70  Pulse 67  Temp(Src) 98.7 F (37.1 C) (Oral)  Resp 17  Wt 170 lb (77.111 kg)  SpO2 100%  Physical Exam  Nursing note and vitals reviewed. Constitutional: She is oriented to person, place, and time. She appears well-developed and well-nourished. No distress.  HENT:  Head: Normocephalic and atraumatic.  Right Ear: External ear normal.  Left Ear: External ear normal.  Neck: Normal range of motion. Neck supple.  Cardiovascular: Normal rate and regular rhythm.  Exam reveals no gallop and no friction rub.   No murmur  heard. Pulmonary/Chest: Effort normal and breath sounds normal. No respiratory distress. She has no wheezes.  Abdominal: Soft. Bowel sounds are normal. She exhibits no distension. There is no tenderness.  Musculoskeletal: Normal range of motion.  Neurological: She is alert and oriented to person, place, and time. No cranial nerve deficit. She exhibits normal muscle tone. Coordination normal.  Skin: Skin is warm and dry. She is not diaphoretic.    ED Course  Procedures (including critical care time)   Labs Reviewed  CBC  DIFFERENTIAL  COMPREHENSIVE METABOLIC PANEL   No results found.   No diagnosis found.    MDM  The CT looks okay.  There are no abnormalities with the labs.  I discussed this situation with the family and they are very uncomfortable with her returning home.  I have consulted medicine for their opinion regarding whether she requires admission or not.    She was evaluated by Medicine and they suspect trigeminal neuralgia.  They will prescribe neurontin and she will be discharged as I had originally planned.        Geoffery Lyons, MD 04/02/11 1356  Geoffery Lyons, MD 04/03/11 0111

## 2011-04-02 NOTE — ED Notes (Signed)
MD at bedside. 

## 2011-04-02 NOTE — ED Notes (Signed)
Pt returned from CT °

## 2011-04-02 NOTE — ED Notes (Signed)
ZOX:[id number]<AV> Expected date:04/02/11<BR> Expected time:<BR> Means of arrival:<BR> Comments:<BR> EMS AC - n/v/breast cancer

## 2011-04-22 NOTE — ED Provider Notes (Signed)
History     CSN: [id number]  Arrival date & time 03/27/11  1059   First MD Initiated Contact with Patient 03/27/11 1156      Chief Complaint  Patient presents with  . Leg Pain    (Consider location/radiation/quality/duration/timing/severity/associated sxs/prior treatment) HPI Comments: Patient c/o pain to the right hip and leg for several days.  States pain began after she fell, landing on her hip.  Pain is worse with weight bearing and movement and improves with rest.  She denies numbness or weakness, back or neck pain or LOC.  Patient is a 71 y.o. female presenting with leg pain. The history is provided by the patient.  Leg Pain  The incident occurred more than 1 week ago. The incident occurred at home. The injury mechanism was a fall. The pain is present in the right hip and right leg. The quality of the pain is described as aching. The pain is moderate. The pain has been constant since onset. Pertinent negatives include no numbness, no inability to bear weight, no loss of motion, no muscle weakness, no loss of sensation and no tingling. She reports no foreign bodies present. The symptoms are aggravated by activity, palpation and bearing weight. She has tried nothing for the symptoms. The treatment provided no relief.    Past Medical History  Diagnosis Date  . Arthropathy, unspecified, site unspecified   . Anxiety state, unspecified   . Chronic airway obstruction, not elsewhere classified   . Headache   . Other and unspecified hyperlipidemia   . Malignant neoplasm of breast (female), unspecified site   . Unspecified hypothyroidism   . Personal history of colonic polyps     adenomatous  . Internal hemorrhoids without mention of complication   . Other dysphagia   . Esophageal reflux   . Pain in joint, multiple sites   . Anal or rectal pain   . Shingles   . Hiatal hernia   . Esophageal stricture     Past Surgical History  Procedure Date  . Back surgery     X2  .  Mastectomy     left  . Breast lumpectomy   . Thyroidectomy   . Tubal ligation     History reviewed. No pertinent family history.  History  Substance Use Topics  . Smoking status: Never Smoker   . Smokeless tobacco: Never Used  . Alcohol Use: No    OB History    Grav Para Term Preterm Abortions TAB SAB Ect Mult Living                  Review of Systems  Constitutional: Negative for fever.  HENT: Negative for neck pain and neck stiffness.   Musculoskeletal: Positive for arthralgias. Negative for myalgias, back pain and joint swelling.  Skin: Negative.   Neurological: Negative for tingling, weakness, numbness and headaches.  All other systems reviewed and are negative.    Allergies  Atorvastatin; Meperidine hcl; Statins; and Sulfonamide derivatives  Home Medications   Current Outpatient Rx  Name Route Sig Dispense Refill  . ASPIRIN 81 MG PO TABS Oral Take 81 mg by mouth daily.      Marland Kitchen CALCIUM CARBONATE-VITAMIN D 500-125 MG-UNIT PO TABS Oral Take 1 capsule by mouth daily.      Marland Kitchen VITAMIN D3 2000 UNITS PO CAPS Oral Take 2,000 Units by mouth daily.      Marland Kitchen EZETIMIBE 10 MG PO TABS Oral Take 10 mg by mouth daily.      Marland Kitchen  HYDROCHLOROTHIAZIDE 12.5 MG PO CAPS Oral Take 12.5 mg by mouth daily.      Marland Kitchen LANSOPRAZOLE 15 MG PO CPDR Oral Take 15 mg by mouth as needed. For heartburn    . LEVOTHYROXINE SODIUM 125 MCG PO TABS Oral Take 125 mcg by mouth daily.      . MULTIVITAMINS PO CAPS Oral Take 1 capsule by mouth daily.      Marland Kitchen FISH OIL 1000 MG PO CAPS Oral Take 1 capsule by mouth daily.      Marland Kitchen POLYETHYLENE GLYCOL 3350 PO POWD Oral Take 17 g by mouth daily.      . ASPIRIN 325 MG PO TABS Oral Take 750 mg by mouth daily.    Marland Kitchen GABAPENTIN 300 MG PO CAPS Oral Take 1 capsule (300 mg total) by mouth at bedtime. 30 capsule 1  . GABAPENTIN 300 MG PO CAPS Oral Take 1 capsule (300 mg total) by mouth at bedtime. 30 capsule 1  . IBUPROFEN 200 MG PO TABS Oral Take 400 mg by mouth every 6 (six) hours  as needed.      BP 117/72  Pulse 77  Temp(Src) 97.6 F (36.4 C) (Oral)  Resp 18  Ht 5' 9.5" (1.765 m)  Wt 170 lb (77.111 kg)  BMI 24.74 kg/m2  SpO2 100%  Physical Exam  Nursing note and vitals reviewed. Constitutional: She is oriented to person, place, and time. She appears well-developed and well-nourished. No distress.  HENT:  Head: Normocephalic and atraumatic.  Neck: Normal range of motion. Neck supple.  Cardiovascular: Normal rate, regular rhythm and intact distal pulses.   No murmur heard. Pulmonary/Chest: Effort normal and breath sounds normal.  Musculoskeletal: She exhibits tenderness. She exhibits no edema.       Right hip: She exhibits tenderness. She exhibits normal range of motion, normal strength, no bony tenderness, no swelling, no crepitus, no deformity and no laceration.       Legs: Neurological: She is alert and oriented to person, place, and time. No cranial nerve deficit or sensory deficit. She exhibits normal muscle tone. Coordination normal.  Reflex Scores:      Patellar reflexes are 2+ on the right side and 2+ on the left side.      Achilles reflexes are 2+ on the right side and 2+ on the left side. Skin: Skin is warm and dry.    ED Course  Procedures (including critical care time)  Results for orders placed during the hospital encounter of 03/27/11  URINALYSIS, ROUTINE W REFLEX MICROSCOPIC      Component Value Range   Color, Urine YELLOW  YELLOW    APPearance CLEAR  CLEAR    Specific Gravity, Urine <1.005 (*) 1.005 - 1.030    pH 6.5  5.0 - 8.0    Glucose, UA NEGATIVE  NEGATIVE (mg/dL)   Hgb urine dipstick NEGATIVE  NEGATIVE    Bilirubin Urine NEGATIVE  NEGATIVE    Ketones, ur NEGATIVE  NEGATIVE (mg/dL)   Protein, ur NEGATIVE  NEGATIVE (mg/dL)   Urobilinogen, UA 0.2  0.0 - 1.0 (mg/dL)   Nitrite NEGATIVE  NEGATIVE    Leukocytes, UA SMALL (*) NEGATIVE   URINE MICROSCOPIC-ADD ON      Component Value Range   Squamous Epithelial / LPF RARE  RARE      WBC, UA 3-6  <3 (WBC/hpf)    Dg Pelvis 1-2 Views  03/27/2011  *RADIOLOGY REPORT*  Clinical Data: Fall 1 week ago.  Hip pain, especially with weightbearing.  PELVIS - 1-2 VIEW  Comparison: Right hip films same date.  Lumbar spine films of 05/23/2009.  Findings: Both femoral heads are located.  Mild joint space narrowing involves the weightbearing surface of bilateral hips. Sacroiliac joints are symmetric.  Degenerative disc disease is eccentric left L4-L5.  IMPRESSION: No acute osseous abnormality.  Original Report Authenticated By: Consuello Bossier, M.D.   Dg Hip Complete Right  03/27/2011  *RADIOLOGY REPORT*  Clinical Data: Trauma 1 week ago with pain.  RIGHT HIP - COMPLETE 2+ VIEW  Comparison: Pelvic films same date  Findings: No acute fracture.  Soft tissue calcification projects lateral of the greater trochanter of the proximal femur.  Mild joint space and involves the weightbearing surface of the hip.  IMPRESSION: No acute osseous abnormality.  Original Report Authenticated By: Consuello Bossier, M.D.    1. Sprain of right hip       MDM    Patient is ambulates with a slow but steady gait.  No focal neuro deficits.  Agrees to f/u with her PMD or return here if needed   Patient / Family / Caregiver understand and agree with initial ED impression and plan with expectations set for ED visit. Pt stable in ED with no significant deterioration in condition. Pt feels improved after observation and/or treatment in ED.     Eileen Kangas L. Lindzy Rupert, [place] 04/22/11 1353

## 2011-04-23 NOTE — ED Provider Notes (Signed)
Medical screening examination/treatment/procedure(s) were conducted as a shared visit with non-physician practitioner(s) and myself.  I personally evaluated the patient during the encounter   Joya Gaskins, MD 04/23/11 [id number]

## 2011-05-12 ENCOUNTER — Other Ambulatory Visit: Payer: Self-pay | Admitting: Family Medicine

## 2011-05-12 DIAGNOSIS — Z139 Encounter for screening, unspecified: Secondary | ICD-10-CM

## 2011-05-20 ENCOUNTER — Ambulatory Visit (HOSPITAL_COMMUNITY)
Admission: RE | Admit: 2011-05-20 | Discharge: 2011-05-20 | Disposition: A | Discharge status: Home / Self Care | Payer: Medicare Other | Source: Ambulatory Visit | Attending: Family Medicine | Admitting: Family Medicine

## 2011-05-20 DIAGNOSIS — Z1231 Encounter for screening mammogram for malignant neoplasm of breast: Secondary | ICD-10-CM | POA: Insufficient documentation

## 2011-05-20 DIAGNOSIS — Z139 Encounter for screening, unspecified: Secondary | ICD-10-CM

## 2012-04-13 ENCOUNTER — Other Ambulatory Visit: Payer: Self-pay | Admitting: Family Medicine

## 2012-04-14 NOTE — Telephone Encounter (Signed)
Patient last seen for chronic health check on 10-27-11. Last acute visit on 01-10-12. Please advise. No follow up appt currently scheuled. Thank you

## 2012-04-14 NOTE — Telephone Encounter (Signed)
Please give patient an appointment to be seen when possible.

## 2012-04-25 ENCOUNTER — Telehealth: Payer: Self-pay | Admitting: Family Medicine

## 2012-04-26 ENCOUNTER — Ambulatory Visit (INDEPENDENT_AMBULATORY_CARE_PROVIDER_SITE_OTHER): Payer: Medicare Other

## 2012-04-26 DIAGNOSIS — E538 Deficiency of other specified B group vitamins: Secondary | ICD-10-CM

## 2012-04-26 MED ORDER — CYANOCOBALAMIN 1000 MCG/ML IJ SOLN
1000.0000 ug | Freq: Once | INTRAMUSCULAR | Status: AC
  Administered 2012-04-26: 1000 ug via INTRAMUSCULAR

## 2012-04-26 NOTE — Telephone Encounter (Signed)
Bilateral lower extremity swelling and tingling since last week.  No improvement with elevation.   Denies dyspnea.   Takes HCTZ 12.5mg  daily.   Has follow-up appt with Dr. Christell Constant on 05/17/12.  She wanted to know if there is anything else that can be done.

## 2012-04-27 NOTE — Telephone Encounter (Signed)
Returned [employer].Klingel Gross not available i advised she would call her back

## 2012-04-28 NOTE — Telephone Encounter (Signed)
LMOM

## 2012-05-01 ENCOUNTER — Other Ambulatory Visit: Payer: Self-pay | Admitting: Family Medicine

## 2012-05-01 DIAGNOSIS — Z139 Encounter for screening, unspecified: Secondary | ICD-10-CM

## 2012-05-03 NOTE — Telephone Encounter (Signed)
LMOM

## 2012-05-17 ENCOUNTER — Encounter: Payer: Self-pay | Admitting: Family Medicine

## 2012-05-17 ENCOUNTER — Ambulatory Visit (INDEPENDENT_AMBULATORY_CARE_PROVIDER_SITE_OTHER): Payer: Medicare Other | Admitting: Family Medicine

## 2012-05-17 VITALS — BP 127/79 | HR 86 | Temp 97.0°F | Ht 69.5 in | Wt 174.2 lb

## 2012-05-17 DIAGNOSIS — G629 Polyneuropathy, unspecified: Secondary | ICD-10-CM

## 2012-05-17 DIAGNOSIS — E785 Hyperlipidemia, unspecified: Secondary | ICD-10-CM

## 2012-05-17 DIAGNOSIS — M2550 Pain in unspecified joint: Secondary | ICD-10-CM

## 2012-05-17 DIAGNOSIS — E039 Hypothyroidism, unspecified: Secondary | ICD-10-CM

## 2012-05-17 DIAGNOSIS — G589 Mononeuropathy, unspecified: Secondary | ICD-10-CM

## 2012-05-17 DIAGNOSIS — D72829 Elevated white blood cell count, unspecified: Secondary | ICD-10-CM

## 2012-05-17 DIAGNOSIS — E559 Vitamin D deficiency, unspecified: Secondary | ICD-10-CM

## 2012-05-17 LAB — HEPATIC FUNCTION PANEL
AST: 28 U/L (ref 0–37)
Alkaline Phosphatase: 65 U/L (ref 39–117)
Bilirubin, Direct: 0.1 mg/dL (ref 0.0–0.3)
Indirect Bilirubin: 0.4 mg/dL (ref 0.0–0.9)
Total Bilirubin: 0.5 mg/dL (ref 0.3–1.2)

## 2012-05-17 LAB — BASIC METABOLIC PANEL WITH GFR
BUN: 27 mg/dL — ABNORMAL HIGH (ref 6–23)
CO2: 27 mEq/L (ref 19–32)
Calcium: 9.6 mg/dL (ref 8.4–10.5)
Chloride: 101 mEq/L (ref 96–112)
Creat: 0.59 mg/dL (ref 0.50–1.10)

## 2012-05-17 LAB — POCT CBC
Lymph, poc: 1.4 (ref 0.6–3.4)
MCH, POC: 30.7 pg (ref 27–31.2)
MCHC: 33.6 g/dL (ref 31.8–35.4)
MCV: 91.3 fL (ref 80–97)
Platelet Count, POC: 286 10*3/uL (ref 142–424)
RBC: 4.1 M/uL (ref 4.04–5.48)

## 2012-05-17 LAB — THYROID PANEL WITH TSH
Free Thyroxine Index: 4.5 — ABNORMAL HIGH (ref 1.0–3.9)
T3 Uptake: 39 % — ABNORMAL HIGH (ref 22.5–37.0)
TSH: 0.252 u[IU]/mL — ABNORMAL LOW (ref 0.350–4.500)

## 2012-05-17 NOTE — Progress Notes (Signed)
Subjective:    Patient ID: [name], female    DOB: November 30, 1940, 72 y.o.   MRN: [id number]  HPI This patient presents for recheck of multiple medical problems. No one accompanies the patient today.  Patient Active Problem List   Diagnosis Date Noted  . Trigeminal neuralgia 04/02/2011  . Dysphagia 07/23/2010  . GERD (gastroesophageal reflux disease) 07/23/2010  . Right facial pain 07/23/2010  . ANXIETY 05/24/2008  . COPD 05/24/2008  . CONSTIPATION 05/24/2008  . ARTHRITIS 05/24/2008  . IDIOPATHIC OSTEOPOROSIS 05/24/2008  . ADENOCARCINOMA, BREAST 05/22/2008  . HYPOTHYROIDISM 05/22/2008  . HYPERLIPIDEMIA 05/22/2008  . INTERNAL HEMORRHOIDS 05/22/2008  . GERD 05/22/2008  . HEADACHE 05/22/2008  . OTHER DYSPHAGIA 05/22/2008  . COLONIC POLYPS, ADENOMATOUS, HX OF 05/22/2008    In addition, she complains of allergy symptoms and myalgia. She recently we had a flare of her trigeminal neuralgia and at the same time developed an abscessed tooth on the right. She sees a neurologist in [place]. The dentist is following the abscessed tooth. She is currently on an antibiotic for the tooth.  The allergies, current medications, past medical history, surgical history, family and social history are reviewed.  Immunizations reviewed.  Health maintenance reviewed.  The following items are outstanding: T-Dap       Review of Systems  HENT: Positive for ear pain, congestion and postnasal drip.   Eyes: Positive for redness (R eye).  Respiratory: Positive for cough (dry).   Cardiovascular: Positive for leg swelling (bilateral).  Endocrine: Negative.   Genitourinary: Positive for frequency. Negative for dysuria.  Musculoskeletal: Positive for myalgias (lower legs, cramping), back pain (cervical , LBP) and arthralgias (knees).  Neurological: Negative.   Psychiatric/Behavioral: Positive for sleep disturbance (due to meds).       Objective:   Physical Exam BP 127/79  Pulse 86   Temp(Src) 97 F (36.1 C) (Oral)  Ht 5' 9.5" (1.765 m)  Wt 174 lb 3.2 oz (79.017 kg)  BMI 25.36 kg/m2  The patient appeared well nourished and normally developed, alert and oriented to time and place. Speech, behavior and judgement appear normal. Vital signs as documented.  Head exam is unremarkable. No scleral icterus or pallor noted. She has an abscessed tooth that has recently been drained her right lower gum. She had difficulty opening her mouth because of this abscess. There is head congestion bilaterally.  Neck is without jugular venous distension, thyromegally, or carotid bruits. Carotid upstrokes are brisk bilaterally. No cervical adenopathy. Lungs are clear anteriorly and posteriorly to auscultation. Normal respiratory effort. No lymph nodes palpable in the axillary regions bilaterally. Cardiac exam reveals regular rate and rhythm at 72 per minute. First and second heart sounds normal.  No murmurs, rubs or gallops.  Abdominal exam reveals normal bowl sounds, no masses, no organomegaly and no aortic enlargement. No inguinal adenopathy. Extremities are nonedematous and both femoral and pedal pulses are normal. Skin without pallor or jaundice.  Warm and dry, without rash. Neurologic exam reveals normal deep tendon reflexes and normal sensation.          Assessment & Plan:  1. Hypothyroid - Thyroid Panel With TSH  2. Neuropathy - BASIC METABOLIC PANEL WITH GFR; Standing - BASIC METABOLIC PANEL WITH GFR  3. Hyperlipemia - NMR Lipoprofile with Lipids; Standing - Hepatic function panel; Standing - NMR Lipoprofile with Lipids - Hepatic function panel  4. Arthralgia - BASIC METABOLIC PANEL WITH GFR; Standing - BASIC METABOLIC PANEL WITH GFR  5. Elevated white blood cell count -  POCT CBC; Standing - POCT CBC  6. Vitamin D deficiency - Vitamin D 25 hydroxy; Standing - Vitamin D 25 hydroxy  Patient Instructions  Consider trying Nasacort AQ over-the-counter 1-2 sprays  each nostril daily Can also use either Allegra, Zyrtec, or Claritin Always be careful and don't climb so you won't fall You continue therapeutic lifestyle changes as aggressive as possible, this means diet and exercise

## 2012-05-17 NOTE — Patient Instructions (Signed)
Consider trying Nasacort AQ over-the-counter 1-2 sprays each nostril daily Can also use either Allegra, Zyrtec, or Claritin Always be careful and don't climb so you won't fall You continue therapeutic lifestyle changes as aggressive as possible, this means diet and exercise

## 2012-05-19 LAB — NMR LIPOPROFILE WITH LIPIDS
HDL Particle Number: 35.8 umol/L (ref >=30.5)
HDL-C: 55 mg/dL (ref >=40)
Large HDL-P: 8.6 umol/L (ref >=4.8)
Triglycerides: 88 mg/dL (ref <150)

## 2012-05-25 ENCOUNTER — Ambulatory Visit (HOSPITAL_COMMUNITY): Payer: Medicare Other

## 2012-05-30 ENCOUNTER — Ambulatory Visit (HOSPITAL_COMMUNITY)
Admission: RE | Admit: 2012-05-30 | Discharge: 2012-05-30 | Disposition: A | Discharge status: Home / Self Care | Payer: Medicare Other | Source: Ambulatory Visit | Attending: Family Medicine | Admitting: Family Medicine

## 2012-05-30 DIAGNOSIS — Z1231 Encounter for screening mammogram for malignant neoplasm of breast: Secondary | ICD-10-CM | POA: Insufficient documentation

## 2012-05-30 DIAGNOSIS — Z139 Encounter for screening, unspecified: Secondary | ICD-10-CM

## 2012-06-05 ENCOUNTER — Other Ambulatory Visit: Payer: Self-pay | Admitting: Medical Oncology

## 2012-06-05 ENCOUNTER — Telehealth: Payer: Self-pay | Admitting: Family Medicine

## 2012-06-05 DIAGNOSIS — C50919 Malignant neoplasm of unspecified site of unspecified female breast: Secondary | ICD-10-CM

## 2012-06-05 NOTE — Progress Notes (Signed)
LMOM

## 2012-06-06 ENCOUNTER — Telehealth: Payer: Self-pay | Admitting: Oncology

## 2012-06-06 ENCOUNTER — Telehealth: Payer: Self-pay | Admitting: *Deleted

## 2012-06-06 NOTE — Telephone Encounter (Signed)
Pt notified of lab results Will decrease thyroid med to 1 tablet daily except 1/2 on Sunday and recheck thyroid panel in 6 weeks

## 2012-06-06 NOTE — Telephone Encounter (Signed)
S/W THE PT AND SHE IS AWARE OF HER July 25TH APPTS

## 2012-06-06 NOTE — Telephone Encounter (Signed)
Message copied by Bearl Mulberry on Tue Jun 06, 2012 10:02 AM ------      Message from: Magdalene River      Created: Mon Jun 05, 2012  3:14 PM         lmtcb- [id number] (told to ask for kay)                        ----- Message -----         From: Azalee Course, CMA         Sent: 05/25/2012   8:25 AM           To: Leilani Merl, LPN                   ------

## 2012-06-20 ENCOUNTER — Ambulatory Visit (INDEPENDENT_AMBULATORY_CARE_PROVIDER_SITE_OTHER): Payer: Medicare Other

## 2012-06-20 DIAGNOSIS — E538 Deficiency of other specified B group vitamins: Secondary | ICD-10-CM

## 2012-06-20 MED ORDER — CYANOCOBALAMIN 1000 MCG/ML IJ SOLN
1000.0000 ug | Freq: Once | INTRAMUSCULAR | Status: AC
  Administered 2012-06-20: 1000 ug via INTRAMUSCULAR

## 2012-06-20 NOTE — Patient Instructions (Signed)
Vitamin B12 Injections Every person needs vitamin B12. A deficiency develops when the body does not get enough of it. One way to overcome this is by getting B12 shots (injections). A B12 shot puts the vitamin directly into muscle tissue. This avoids any problems your body might have in absorbing it from food or a pill. In some people, the body has trouble using the vitamin correctly. This can cause a B12 deficiency. Not consuming enough of the vitamin can also cause a deficiency. Getting enough vitamin B12 can be hard for elderly people. Sometimes, they do not eat a well-balanced diet. The elderly are also more likely than younger people to have medical conditions or take medications that can lead to a deficiency. WHAT DOES VITAMIN B12 DO? Vitamin B12 does many things to help the body work right:  It helps the body make healthy red blood cells.  It helps maintain nerve cells.  It is involved in the body's process of converting food into energy (metabolism).  It is needed to make the genetic material in all cells (DNA). VITAMIN B12 FOOD SOURCES Most people get plenty of vitamin B12 through the foods they eat. It is present in:  Meat, fish, poultry, and eggs.  Milk and milk products.  It also is added when certain foods are made, including some breads, cereals and yogurts. The food is then called "fortified". CAUSES The most common causes of vitamin B12 deficiency are:  Pernicious anemia. The condition develops when the body cannot make enough healthy red blood cells. This stems from a lack of a protein made in the stomach (intrinsic factor). People without this protein cannot absorb enough vitamin B12 from food.  Malabsorption. This is when the body cannot absorb the vitamin. It can be caused by:  Pernicious anemia.  Surgery to remove part or all of the stomach can lead to malabsorption. Removal of part or all of the small intestine can also cause malabsorption.  Vegetarian diet.  People who are strict about not eating foods from animals could have trouble taking in enough vitamin B12 from diet alone.  Medications. Some medicines have been linked to B12 deficiency, such as Metformin (a drug prescribed for type 2 diabetes). Long-term use of stomach acid suppressants also can keep the vitamin from being absorbed.  Intestinal problems such as inflammatory bowel disease. If there are problems in the digestive tract, vitamin B12 may not be absorbed in good enough amounts. SYMPTOMS People who do not get enough B12 can develop problems. These can include:  Anemia. This is when the body has too few red blood cells. Red blood cells carry oxygen to the rest of the body. Without a healthy supply of red blood cells, people can feel:  Tired (fatigued).  Weak.  Severe anemia can cause:  Shortness of breath.  Dizziness.  Rapid heart rate.  Paleness.  Other Vitamin B12 deficiency symptoms include:  Diarrhea.  Numbness or tingling in the hands or feet.  Loss of appetite.  Confusion.  Sores on the tongue or in the mouth. LET YOUR CAREGIVER KNOW ABOUT:  Any allergies. It is very important to know if you are allergic or sensitive to cobalt. Vitamin B12 contains cobalt.  Any history of kidney disease.  All medications you are taking. Include prescription and over-the-counter medicines, herbs and creams.  Whether you are pregnant or breast-feeding.  If you have Leber's disease, a hereditary eye condition, vitamin B12 could make it worse. RISKS AND COMPLICATIONS Reactions to an injection are   usually temporary. They might include:  Pain at the injection site.  Redness, swelling or tenderness at the site.  Headache, dizziness or weakness.  Nausea, upset stomach or diarrhea.  Numbness or tingling.  Fever.  Joint pain.  Itching or rash. If a reaction does not go away in a short while, talk with your healthcare provider. A change in the way the shots are  given, or where they are given, might need to be made. BEFORE AN INJECTION To decide whether B12 injections are right for you, your healthcare provider will probably:  Ask about your medical history.  Ask questions about your diet.  Ask about symptoms such as:  Have you felt weak?  Do you feel unusually tired?  Do you get dizzy?  Order blood tests. These may include a test to:  Check the level of red cells in your blood.  Measure B12 levels.  Check for the presence of intrinsic factor. VITAMIN B12 INJECTIONS How often you will need a vitamin B12 injection will depend on how severe your deficiency is. This also will affect how long you will need to get them. People with pernicious anemia usually get injections for their entire life. Others might get them for a shorter period. For many people, injections are given daily or weekly for several weeks. Then, once B12 levels are normal, injections are given just once a month. If the cause of the deficiency can be fixed, the injections can be stopped. Talk with your healthcare provider about what you should expect. For an injection:  The injection site will be cleaned with an alcohol swab.  Your healthcare provider will insert a needle directly into a muscle. Most any muscle can be used. Most often, an arm muscle is used. A buttocks muscle can also be used. Many people say shots in that area are less painful.  A small adhesive bandage may be put over the injection site. It usually can be taken off in an hour or less. Injections can be given by your healthcare provider. In some cases, family members give them. Sometimes, people give them to themselves. Talk with your healthcare provider about what would be best for you. If someone other than your healthcare provider will be giving the shots, the person will need to be trained to give them correctly. HOME CARE INSTRUCTIONS   You can remove the adhesive bandage within an hour of getting a  shot.  You should be able to go about your normal activities right away.  Avoid drinking large amounts of alcohol while taking vitamin B12 shots. Alcohol can interfere with the body's use of the vitamin. SEEK MEDICAL CARE IF:   Pain, redness, swelling or tenderness at the injection site does not get better or gets worse.  Headache, dizziness or weakness does not go away.  You develop a fever of more than 100.5 F (38.1 C). SEEK IMMEDIATE MEDICAL CARE IF:   You have chest pain.  You develop shortness of breath.  You have muscle weakness that gets worse.  You develop numbness, weakness or tingling on one side or one area of the body.  You have symptoms of an allergic reaction, such as:  Hives.  Difficulty breathing.  Swelling of the lips, face, tongue or throat.  You develop a fever of more than 102.0 F (38.9 C). MAKE SURE YOU:   Understand these instructions.  Will watch your condition.  Will get help right away if you are not doing well or get worse. Document   Released: 03/19/2008 Document Revised: 03/15/2011 Document Reviewed: 03/19/2008 [employer] Patient Information 2014 [employer].  

## 2012-06-20 NOTE — Progress Notes (Signed)
Tolerated injection well. 

## 2012-07-10 ENCOUNTER — Ambulatory Visit (INDEPENDENT_AMBULATORY_CARE_PROVIDER_SITE_OTHER): Payer: Medicare Other

## 2012-07-10 ENCOUNTER — Encounter: Payer: Self-pay | Admitting: Family Medicine

## 2012-07-10 ENCOUNTER — Ambulatory Visit (INDEPENDENT_AMBULATORY_CARE_PROVIDER_SITE_OTHER): Payer: Medicare Other | Admitting: Family Medicine

## 2012-07-10 VITALS — BP 160/92 | HR 85 | Temp 97.0°F | Wt 178.0 lb

## 2012-07-10 DIAGNOSIS — R221 Localized swelling, mass and lump, neck: Secondary | ICD-10-CM

## 2012-07-10 DIAGNOSIS — R059 Cough, unspecified: Secondary | ICD-10-CM

## 2012-07-10 DIAGNOSIS — R05 Cough: Secondary | ICD-10-CM

## 2012-07-10 DIAGNOSIS — E039 Hypothyroidism, unspecified: Secondary | ICD-10-CM

## 2012-07-10 DIAGNOSIS — R220 Localized swelling, mass and lump, head: Secondary | ICD-10-CM

## 2012-07-10 LAB — POCT CBC
Granulocyte percent: 64.8 %G (ref 37–80)
HCT, POC: 38.2 % (ref 37.7–47.9)
Hemoglobin: 13.7 g/dL (ref 12.2–16.2)
Lymph, poc: 1.9 (ref 0.6–3.4)
MCH, POC: 31.8 pg — AB (ref 27–31.2)
MCHC: 35.8 g/dL — AB (ref 31.8–35.4)
MCV: 88.9 fL (ref 80–97)
MPV: 8.8 fL (ref 0–99.8)
POC Granulocyte: 4.1 (ref 2–6.9)
POC LYMPH PERCENT: 30.5 %L (ref 10–50)
Platelet Count, POC: 193 10*3/uL (ref 142–424)
RBC: 4.3 M/uL (ref 4.04–5.48)
RDW, POC: 14.6 %
WBC: 6.3 10*3/uL (ref 4.6–10.2)

## 2012-07-10 NOTE — Progress Notes (Signed)
Patient ID: [name], female   DOB: 01/20/1940, 72 y.o.   MRN: [id number] SUBJECTIVE: CC: Chief Complaint  Patient presents with  . Acute Visit    sees dr Magnus Sinning but he is retiring but has swollen area on neck and c/o hoarseness    HPI: H/o of breast cancer since 1996. Hoarseness and  Swelling and congestion of the throat. Swelling is located under the left jaw. Noticed this in the Spring with a  Red spot and  Swelling since and  Doesn't feel well. Has an appointment with oncology but wont get to see Dr Arline Asp because he is retiring. Weight gain no weight loss. No swollen glands elsewhere. Some night sweats. Cough and hoarseness. Hoarseness not new. Had radiation therapy years ago and the hoarseness was there. No chest pain.some  Chest congestion.  Past Medical History  Diagnosis Date  . Arthropathy, unspecified, site unspecified   . Anxiety state, unspecified   . Chronic airway obstruction, not elsewhere classified   . Headache(784.0)   . Other and unspecified hyperlipidemia   . Malignant neoplasm of breast (female), unspecified site   . Unspecified hypothyroidism   . Personal history of colonic polyps     adenomatous  . Internal hemorrhoids without mention of complication   . Other dysphagia   . Esophageal reflux   . Pain in joint, multiple sites   . Anal or rectal pain   . Shingles   . Hiatal hernia   . Esophageal stricture    Past Surgical History  Procedure Laterality Date  . Back surgery      X2  . Mastectomy      left  . Breast lumpectomy    . Thyroidectomy    . Tubal ligation     History   Social History  . Marital Status: Divorced    Spouse Name: N/A    Number of Children: 2  . Years of Education: N/A   Occupational History  . retired    Social History Main Topics  . Smoking status: Never Smoker   . Smokeless tobacco: Never Used  . Alcohol Use: No  . Drug Use: No  . Sexually Active: Not on file   Other Topics Concern  . Not on file    Social History Narrative  . No narrative on file   No family history on file. Current Outpatient Prescriptions on File Prior to Visit  Medication Sig Dispense Refill  . aspirin 81 MG tablet Take 81 mg by mouth daily.        . Calcium Carbonate-Vitamin D (CALCIUM 500/D) 500-125 MG-UNIT TABS Take 1 capsule by mouth daily.        . Cholecalciferol (VITAMIN D3) 2000 UNITS capsule Take 4,000 Units by mouth daily.       Marland Kitchen ezetimibe (ZETIA) 10 MG tablet Take 10 mg by mouth daily.        . hydrochlorothiazide (,MICROZIDE/HYDRODIURIL,) 12.5 MG capsule Take 12.5 mg by mouth daily.        Marland Kitchen ibuprofen (ADVIL,MOTRIN) 200 MG tablet Take 400 mg by mouth every 6 (six) hours as needed.      . lansoprazole (PREVACID) 15 MG capsule Take 15 mg by mouth as needed. For heartburn      . levothyroxine (SYNTHROID, LEVOTHROID) 125 MCG tablet Take 125 mcg by mouth daily.        Marland Kitchen lisinopril-hydrochlorothiazide (PRINZIDE,ZESTORETIC) 10-12.5 MG per tablet TAKE (1) TABLET BY MOUTH DAILY.  30 tablet  2  . Multiple Vitamin (  MULTIVITAMIN) capsule Take 1 capsule by mouth daily.        . nortriptyline (PAMELOR) 25 MG capsule Take 3 capsules by mouth at bedtime and may repeat dose one time if needed.      . Omega-3 Fatty Acids (FISH OIL) 1000 MG CAPS Take 1 capsule by mouth daily.        . polyethylene glycol powder (MIRALAX) powder Take 17 g by mouth daily.        . clindamycin (CLEOCIN) 300 MG capsule Take 1 capsule by mouth 3 (three) times daily.      Marland Kitchen gabapentin (NEURONTIN) 300 MG capsule Take 1 capsule (300 mg total) by mouth at bedtime.  30 capsule  1   No current facility-administered medications on file prior to visit.   Allergies  Allergen Reactions  . Atorvastatin Other (See Comments)    Muscle cramps  . Meperidine Hcl Nausea And Vomiting    Makes me 'deathly' sick  . Statins     REACTION: Patient cannot tolerate due cramps  . Sulfonamide Derivatives Other (See Comments)    Unknown reaction    There  is no immunization history on file for this patient. Prior to Admission medications   Medication Sig Start Date End Date Taking? Authorizing Provider  aspirin 81 MG tablet Take 81 mg by mouth daily.     Yes Historical Provider, MD  Calcium Carbonate-Vitamin D (CALCIUM 500/D) 500-125 MG-UNIT TABS Take 1 capsule by mouth daily.     Yes Historical Provider, MD  Cholecalciferol (VITAMIN D3) 2000 UNITS capsule Take 4,000 Units by mouth daily.    Yes Historical Provider, MD  ezetimibe (ZETIA) 10 MG tablet Take 10 mg by mouth daily.     Yes Historical Provider, MD  hydrochlorothiazide (,MICROZIDE/HYDRODIURIL,) 12.5 MG capsule Take 12.5 mg by mouth daily.     Yes Historical Provider, MD  ibuprofen (ADVIL,MOTRIN) 200 MG tablet Take 400 mg by mouth every 6 (six) hours as needed.   Yes Historical Provider, MD  lansoprazole (PREVACID) 15 MG capsule Take 15 mg by mouth as needed. For heartburn   Yes Historical Provider, MD  levothyroxine (SYNTHROID, LEVOTHROID) 125 MCG tablet Take 125 mcg by mouth daily.     Yes Historical Provider, MD  lisinopril-hydrochlorothiazide (PRINZIDE,ZESTORETIC) 10-12.5 MG per tablet TAKE (1) TABLET BY MOUTH DAILY. 04/13/12  Yes [name], MD  Multiple Vitamin (MULTIVITAMIN) capsule Take 1 capsule by mouth daily.     Yes Historical Provider, MD  nortriptyline (PAMELOR) 25 MG capsule Take 3 capsules by mouth at bedtime and may repeat dose one time if needed. 05/11/12  Yes Historical Provider, MD  Omega-3 Fatty Acids (FISH OIL) 1000 MG CAPS Take 1 capsule by mouth daily.     Yes Historical Provider, MD  polyethylene glycol powder (MIRALAX) powder Take 17 g by mouth daily.     Yes Historical Provider, MD  clindamycin (CLEOCIN) 300 MG capsule Take 1 capsule by mouth 3 (three) times daily. 05/15/12   Historical Provider, MD  gabapentin (NEURONTIN) 300 MG capsule Take 1 capsule (300 mg total) by mouth at bedtime. 04/02/11 05/17/12  Henderson Cloud, MD     ROS: As above in the  HPI. All other systems are stable or negative.  OBJECTIVE: APPEARANCE:  Patient in no acute distress.The patient appeared well nourished and normally developed. Acyanotic. Waist: VITAL SIGNS:BP 160/92  Pulse 85  Temp(Src) 97 F (36.1 C) (Oral)  Wt 178 lb (80.74 kg)  BMI 25.92 kg/m2 WF  BP 135/82 recheck Anxious about swelling  SKIN: warm and  Dry without overt rashes, tattoos and scars  HEAD and Neck: without JVD, Head and scalp: normal Eyes:No scleral icterus. Fundi normal, eye movements normal. Ears: Auricle normal, canal normal, Tympanic membranes normal, insufflation normal. Nose: normal Throat: normal Neck & thyroid:soft tissue swelling the left submandibular  Area.  CHEST & LUNGS: Chest wall: normal Lungs: Clear  CVS: Reveals the PMI to be normally located. Regular rhythm, First and Second Heart sounds are normal,  absence of murmurs, rubs or gallops. Peripheral vasculature: Radial pulses: normal Dorsal pedis pulses: normal Posterior pulses: normal  ABDOMEN:  Appearance: normal Benign, no organomegaly, no masses, no Abdominal Aortic enlargement. No Guarding , no rebound. No Bruits. Bowel sounds: normal  RECTAL: N/A GU: N/A  EXTREMETIES: nonedematous. Both Femoral and Pedal pulses are normal.  MUSCULOSKELETAL:  Spine: normal Joints: intact  NEUROLOGIC: oriented to time,place and person; nonfocal. Strength is normal Sensory is normal Reflexes are normal Cranial Nerves are normal.  ASSESSMENT: Cough - Plan: DG Chest 2 View  Neck mass - Plan: COMPLETE METABOLIC PANEL WITH GFR, POCT CBC, Amylase, CT Soft Tissue Neck W Wo Contrast  Unspecified hypothyroidism - Plan: TSH  elevated BP due to anxiety.  PLAN: [facility] reading (PRIMARY) by  Dr. Modesto Charon: no pulmonary lesions seen to account for cough.                          Orders Placed This Encounter  Procedures  . DG Chest 2 View    Standing Status: Future     Number of Occurrences: 1     Standing  Expiration Date: 09/09/2013    Order Specific Question:  Reason for Exam (SYMPTOM  OR DIAGNOSIS REQUIRED)    Answer:  congestion    Order Specific Question:  Preferred imaging location?    Answer:  Internal  . CT Soft Tissue Neck W Wo Contrast    Standing Status: Future     Number of Occurrences:      Standing Expiration Date: 10/10/2013    Order Specific Question:  Reason for Exam (SYMPTOM  OR DIAGNOSIS REQUIRED)    Answer:  h/o breast cancer and h/p thyroidectomy, has a left submandibular soft tissue mass.    Order Specific Question:  Preferred imaging location?    Answer:  [facility]  . TSH  . COMPLETE METABOLIC PANEL WITH GFR  . Amylase  . POCT CBC   No orders of the defined types were placed in this encounter.   Discussed with patient about need for  Further evaluation of the sift tissue  Swelling of  A couple of months in light of the history of Breast cancer and the h/o thyroidectomy with no available  Record of the thyroid pathology which was apparently lost in the past at [facility] ( per patient).  Follow up pending the CT. Patient aware to call in 1 week if she has not heard back from me.  otherwise pending the findings whether she will need to see oncology or  ENT.  Await labs and  CT results.  Kilah Drahos P. Modesto Charon, M.D.

## 2012-07-11 LAB — TSH: TSH: 0.663 u[IU]/mL (ref 0.350–4.500)

## 2012-07-11 LAB — COMPLETE METABOLIC PANEL WITH GFR
ALT: 45 U/L — ABNORMAL HIGH (ref 0–35)
AST: 30 U/L (ref 0–37)
Albumin: 4.6 g/dL (ref 3.5–5.2)
Alkaline Phosphatase: 74 U/L (ref 39–117)
BUN: 13 mg/dL (ref 6–23)
CO2: 29 mEq/L (ref 19–32)
Calcium: 9.5 mg/dL (ref 8.4–10.5)
Chloride: 103 mEq/L (ref 96–112)
Creat: 0.5 mg/dL (ref 0.50–1.10)
GFR, Est African American: >89 mL/min
GFR, Est Non African American: >89 mL/min
Glucose, Bld: 107 mg/dL — ABNORMAL HIGH (ref 70–99)
Potassium: 4.1 mEq/L (ref 3.5–5.3)
Sodium: 139 mEq/L (ref 135–145)
Total Bilirubin: 0.9 mg/dL (ref 0.3–1.2)
Total Protein: 6.9 g/dL (ref 6.0–8.3)

## 2012-07-11 LAB — AMYLASE: Amylase: 29 U/L (ref 0–105)

## 2012-07-19 ENCOUNTER — Other Ambulatory Visit: Payer: Self-pay | Admitting: Family Medicine

## 2012-07-20 ENCOUNTER — Ambulatory Visit (HOSPITAL_COMMUNITY)
Admission: RE | Admit: 2012-07-20 | Discharge: 2012-07-20 | Disposition: A | Discharge status: Home / Self Care | Payer: Medicare Other | Source: Ambulatory Visit | Attending: Family Medicine | Admitting: Family Medicine

## 2012-07-20 DIAGNOSIS — R220 Localized swelling, mass and lump, head: Secondary | ICD-10-CM | POA: Insufficient documentation

## 2012-07-20 DIAGNOSIS — R221 Localized swelling, mass and lump, neck: Secondary | ICD-10-CM

## 2012-07-20 MED ORDER — IOHEXOL 300 MG/ML  SOLN
75.0000 mL | Freq: Once | INTRAMUSCULAR | Status: AC | PRN
  Administered 2012-07-20: 75 mL via INTRAVENOUS

## 2012-07-22 NOTE — Progress Notes (Signed)
Quick Note:  Call patient. CT is negative for any mass or tumor or abnormality. Nothing further to do. ______

## 2012-07-27 ENCOUNTER — Other Ambulatory Visit: Payer: Medicare Other | Admitting: Lab

## 2012-07-28 ENCOUNTER — Telehealth: Payer: Self-pay | Admitting: Hematology and Oncology

## 2012-07-28 ENCOUNTER — Ambulatory Visit (HOSPITAL_BASED_OUTPATIENT_CLINIC_OR_DEPARTMENT_OTHER): Payer: Medicare Other | Admitting: Hematology and Oncology

## 2012-07-28 ENCOUNTER — Other Ambulatory Visit (HOSPITAL_BASED_OUTPATIENT_CLINIC_OR_DEPARTMENT_OTHER): Payer: Medicare Other | Admitting: Lab

## 2012-07-28 VITALS — BP 150/86 | HR 87 | Temp 98.0°F | Resp 18 | Ht 69.0 in | Wt 179.4 lb

## 2012-07-28 DIAGNOSIS — C50919 Malignant neoplasm of unspecified site of unspecified female breast: Secondary | ICD-10-CM

## 2012-07-28 LAB — CBC WITH DIFFERENTIAL/PLATELET
Eosinophils Absolute: 0.5 10*3/uL (ref 0.0–0.5)
HCT: 40.1 % (ref 34.8–46.6)
LYMPH%: 31.1 % (ref 14.0–49.7)
MONO#: 0.6 10*3/uL (ref 0.1–0.9)
NEUT#: 4.3 10*3/uL (ref 1.5–6.5)
NEUT%: 53.8 % (ref 38.4–76.8)
Platelets: ADEQUATE 10*3/uL (ref 145–400)
RBC: 4.43 10*6/uL (ref 3.70–5.45)
WBC: 8 10*3/uL (ref 3.9–10.3)
lymph#: 2.5 10*3/uL (ref 0.9–3.3)

## 2012-07-28 LAB — LACTATE DEHYDROGENASE (CC13): LDH: 228 U/L (ref 125–245)

## 2012-07-28 LAB — COMPREHENSIVE METABOLIC PANEL (CC13)
ALT: 45 U/L (ref 0–55)
AST: 39 U/L — ABNORMAL HIGH (ref 5–34)
Albumin: 3.9 g/dL (ref 3.5–5.0)
CO2: 25 mEq/L (ref 22–29)
Calcium: 9.5 mg/dL (ref 8.4–10.4)
Chloride: 106 mEq/L (ref 98–109)
Creatinine: 0.6 mg/dL (ref 0.6–1.1)
Potassium: 3.6 mEq/L (ref 3.5–5.1)

## 2012-07-28 NOTE — Telephone Encounter (Signed)
Gave pt appt for MD on January 2015 and mammogran on June 2015 @[facility] 

## 2012-07-28 NOTE — Progress Notes (Signed)
Patient ID: [name], female   DOB: October 30, 1940, 72 y.o.   MRN: [id number] SUBJECTIVE: CC: No chief complaint on file.  HPI: H/o of breast cancer diagnosed in 1996, stage IIB T3, N0 adenocarcinoma of the left breast. Status post left mastectomy with pathology revealing high-grade histology negative lymph node negative hormone receptors (this is from prior note, I do not have the actual pathology report for review) patient underwent adjuvant treatment with 4 cycle of Adriamycin and Cytoxan completed on October of 1996,  followed by external rotation the completed in January of 1997. Since then she remained free of disease and she has no evidence of disease recurrence. She continues to do well. Last mammogram was done on 05/28/2012 was negative. Weight gain no weight loss. No swollen glands elsewhere. No chest pain.some  Chest congestion.   Past Medical History  Diagnosis Date  . Arthropathy, unspecified, site unspecified   . Anxiety state, unspecified   . Chronic airway obstruction, not elsewhere classified   . Headache(784.0)   . Other and unspecified hyperlipidemia   . Malignant neoplasm of breast (female), unspecified site   . Unspecified hypothyroidism   . Personal history of colonic polyps     adenomatous  . Internal hemorrhoids without mention of complication   . Other dysphagia   . Esophageal reflux   . Pain in joint, multiple sites   . Anal or rectal pain   . Shingles   . Hiatal hernia   . Esophageal stricture    Past Surgical History  Procedure Laterality Date  . Back surgery      X2  . Mastectomy      left  . Breast lumpectomy    . Thyroidectomy    . Tubal ligation     History   Social History  . Marital Status: Divorced    Spouse Name: N/A    Number of Children: 2  . Years of Education: N/A   Occupational History  . retired    Social History Main Topics  . Smoking status: Never Smoker   . Smokeless tobacco: Never Used  . Alcohol Use: No  . Drug Use:  No  . Sexually Active: Not on file   Other Topics Concern  . Not on file   Social History Narrative  . No narrative on file   No family history on file. Current Outpatient Prescriptions on File Prior to Visit  Medication Sig Dispense Refill  . aspirin 81 MG tablet Take 81 mg by mouth daily.        . Calcium Carbonate-Vitamin D (CALCIUM 500/D) 500-125 MG-UNIT TABS Take 1 capsule by mouth daily.        . Cholecalciferol (VITAMIN D3) 2000 UNITS capsule Take 4,000 Units by mouth daily.       Marland Kitchen ezetimibe (ZETIA) 10 MG tablet Take 10 mg by mouth daily.        Marland Kitchen ibuprofen (ADVIL,MOTRIN) 200 MG tablet Take 400 mg by mouth every 6 (six) hours as needed.      Marland Kitchen levothyroxine (SYNTHROID, LEVOTHROID) 125 MCG tablet Take 125 mcg by mouth daily.        Marland Kitchen lisinopril-hydrochlorothiazide (PRINZIDE,ZESTORETIC) 10-12.5 MG per tablet TAKE (1) TABLET BY MOUTH DAILY.  30 tablet  3  . Multiple Vitamin (MULTIVITAMIN) capsule Take 1 capsule by mouth daily.        . nortriptyline (PAMELOR) 25 MG capsule Take 3 capsules by mouth at bedtime and may repeat dose one time if needed.      Marland Kitchen  Omega-3 Fatty Acids (FISH OIL) 1000 MG CAPS Take 1 capsule by mouth daily.        . lansoprazole (PREVACID) 15 MG capsule Take 15 mg by mouth as needed. For heartburn       No current facility-administered medications on file prior to visit.   Allergies  Allergen Reactions  . Atorvastatin Other (See Comments)    Muscle cramps  . Meperidine Hcl Nausea And Vomiting    Makes me 'deathly' sick  . Statins     REACTION: Patient cannot tolerate due cramps  . Sulfonamide Derivatives Other (See Comments)    Unknown reaction    There is no immunization history on file for this patient. Prior to Admission medications   Medication Sig Start Date End Date Taking? Authorizing Provider  aspirin 81 MG tablet Take 81 mg by mouth daily.     Yes Historical Provider, MD  Calcium Carbonate-Vitamin D (CALCIUM 500/D) 500-125 MG-UNIT TABS Take  1 capsule by mouth daily.     Yes Historical Provider, MD  Cholecalciferol (VITAMIN D3) 2000 UNITS capsule Take 4,000 Units by mouth daily.    Yes Historical Provider, MD  ezetimibe (ZETIA) 10 MG tablet Take 10 mg by mouth daily.     Yes Historical Provider, MD  hydrochlorothiazide (,MICROZIDE/HYDRODIURIL,) 12.5 MG capsule Take 12.5 mg by mouth daily.     Yes Historical Provider, MD  ibuprofen (ADVIL,MOTRIN) 200 MG tablet Take 400 mg by mouth every 6 (six) hours as needed.   Yes Historical Provider, MD  lansoprazole (PREVACID) 15 MG capsule Take 15 mg by mouth as needed. For heartburn   Yes Historical Provider, MD  levothyroxine (SYNTHROID, LEVOTHROID) 125 MCG tablet Take 125 mcg by mouth daily.     Yes Historical Provider, MD  lisinopril-hydrochlorothiazide (PRINZIDE,ZESTORETIC) 10-12.5 MG per tablet TAKE (1) TABLET BY MOUTH DAILY. 04/13/12  Yes [name], MD  Multiple Vitamin (MULTIVITAMIN) capsule Take 1 capsule by mouth daily.     Yes Historical Provider, MD  nortriptyline (PAMELOR) 25 MG capsule Take 3 capsules by mouth at bedtime and may repeat dose one time if needed. 05/11/12  Yes Historical Provider, MD  Omega-3 Fatty Acids (FISH OIL) 1000 MG CAPS Take 1 capsule by mouth daily.     Yes Historical Provider, MD  polyethylene glycol powder (MIRALAX) powder Take 17 g by mouth daily.     Yes Historical Provider, MD  clindamycin (CLEOCIN) 300 MG capsule Take 1 capsule by mouth 3 (three) times daily. 05/15/12   Historical Provider, MD  gabapentin (NEURONTIN) 300 MG capsule Take 1 capsule (300 mg total) by mouth at bedtime. 04/02/11 05/17/12  Henderson Cloud, MD     ROS: As above in the HPI. All other systems are stable or negative.  OBJECTIVE: APPEARANCE:  Patient in no acute distress.The patient appeared well nourished and normally developed. Acyanotic. Waist: VITAL SIGNS:BP 150/86  Pulse 87  Temp(Src) 98 F (36.7 C) (Oral)  Resp 18  Ht 5\' 9"  (1.753 m)  Wt 179 lb 6.4 oz  (81.375 kg)  BMI 26.48 kg/m2 WF BP 135/82 recheck Anxious about swelling  SKIN: warm and  Dry without overt rashes, tattoos and scars  HEAD and Neck: without JVD, Head and scalp: normal Neck & thyroid:soft tissue swelling the left submandibular  Area. CHEST & LUNGS: Chest wall: normal Left the breast is surgically absent is no palpable masses, right breast without mass or abnormal finding. Lungs: Clear CVS: Reveals the PMI to be normally located. Regular  rhythm, First and Second Heart sounds are normal,  absence of murmurs, rubs or gallops. ABDOMEN:  Appearance: normal Benign, no organomegaly, no masses, no Abdominal Aortic enlargement. No Guarding , no rebound. No Bruits. Bowel sounds: normal  EXTREMETIES: trace edema. Both Femoral and Pedal pulses are normal.  MUSCULOSKELETAL:  Spine: normal Joints: intact   ASSESSMENT:  Ms. Ammi Hutt is a 72 yrs old female with hx of breast cancer diagnosed in 1996, stage IIB, T3, N0, adenocarcinoma of the left breast. Status post left mastectomy with no evidence of disease relapse. Last mammogram as mentioned above was done in me as this year which was negative. Continue active surveillance with clinic visits and yearly mammography. Patient was advised to keep her clinic appointment. Ms. Poet Hineman to call us in the interim if any issues or problem arise prior to her next clinic visit.  Zachery Dakins, MD 07/28/2012 3:54 PM

## 2012-08-23 ENCOUNTER — Ambulatory Visit (INDEPENDENT_AMBULATORY_CARE_PROVIDER_SITE_OTHER): Payer: Medicare Other | Admitting: *Deleted

## 2012-08-23 DIAGNOSIS — E538 Deficiency of other specified B group vitamins: Secondary | ICD-10-CM

## 2012-08-23 MED ORDER — EZETIMIBE 10 MG PO TABS: 10.0000 mg | ORAL_TABLET | Freq: Every day | ORAL | Status: DC

## 2012-08-23 MED ORDER — VITAMIN B-12 100 MCG PO TABS: 1000.0000 ug | ORAL_TABLET | Freq: Every day | ORAL | Status: DC

## 2012-08-23 MED ORDER — CYANOCOBALAMIN 1000 MCG/ML IJ SOLN
1000.0000 ug | INTRAMUSCULAR | Status: AC
  Administered 2012-08-23 – 2013-01-16 (×5): 1000 ug via INTRAMUSCULAR

## 2012-08-23 NOTE — Patient Instructions (Signed)
Vitamin B12 Injections Every person needs vitamin B12. A deficiency develops when the body does not get enough of it. One way to overcome this is by getting B12 shots (injections). A B12 shot puts the vitamin directly into muscle tissue. This avoids any problems your body might have in absorbing it from food or a pill. In some people, the body has trouble using the vitamin correctly. This can cause a B12 deficiency. Not consuming enough of the vitamin can also cause a deficiency. Getting enough vitamin B12 can be hard for elderly people. Sometimes, they do not eat a well-balanced diet. The elderly are also more likely than younger people to have medical conditions or take medications that can lead to a deficiency. WHAT DOES VITAMIN B12 DO? Vitamin B12 does many things to help the body work right:  It helps the body make healthy red blood cells.  It helps maintain nerve cells.  It is involved in the body's process of converting food into energy (metabolism).  It is needed to make the genetic material in all cells (DNA). VITAMIN B12 FOOD SOURCES Most people get plenty of vitamin B12 through the foods they eat. It is present in:  Meat, fish, poultry, and eggs.  Milk and milk products.  It also is added when certain foods are made, including some breads, cereals and yogurts. The food is then called "fortified". CAUSES The most common causes of vitamin B12 deficiency are:  Pernicious anemia. The condition develops when the body cannot make enough healthy red blood cells. This stems from a lack of a protein made in the stomach (intrinsic factor). People without this protein cannot absorb enough vitamin B12 from food.  Malabsorption. This is when the body cannot absorb the vitamin. It can be caused by:  Pernicious anemia.  Surgery to remove part or all of the stomach can lead to malabsorption. Removal of part or all of the small intestine can also cause malabsorption.  Vegetarian diet.  People who are strict about not eating foods from animals could have trouble taking in enough vitamin B12 from diet alone.  Medications. Some medicines have been linked to B12 deficiency, such as Metformin (a drug prescribed for type 2 diabetes). Long-term use of stomach acid suppressants also can keep the vitamin from being absorbed.  Intestinal problems such as inflammatory bowel disease. If there are problems in the digestive tract, vitamin B12 may not be absorbed in good enough amounts. SYMPTOMS People who do not get enough B12 can develop problems. These can include:  Anemia. This is when the body has too few red blood cells. Red blood cells carry oxygen to the rest of the body. Without a healthy supply of red blood cells, people can feel:  Tired (fatigued).  Weak.  Severe anemia can cause:  Shortness of breath.  Dizziness.  Rapid heart rate.  Paleness.  Other Vitamin B12 deficiency symptoms include:  Diarrhea.  Numbness or tingling in the hands or feet.  Loss of appetite.  Confusion.  Sores on the tongue or in the mouth. LET YOUR CAREGIVER KNOW ABOUT:  Any allergies. It is very important to know if you are allergic or sensitive to cobalt. Vitamin B12 contains cobalt.  Any history of kidney disease.  All medications you are taking. Include prescription and over-the-counter medicines, herbs and creams.  Whether you are pregnant or breast-feeding.  If you have Leber's disease, a hereditary eye condition, vitamin B12 could make it worse. RISKS AND COMPLICATIONS Reactions to an injection are   usually temporary. They might include:  Pain at the injection site.  Redness, swelling or tenderness at the site.  Headache, dizziness or weakness.  Nausea, upset stomach or diarrhea.  Numbness or tingling.  Fever.  Joint pain.  Itching or rash. If a reaction does not go away in a short while, talk with your healthcare provider. A change in the way the shots are  given, or where they are given, might need to be made. BEFORE AN INJECTION To decide whether B12 injections are right for you, your healthcare provider will probably:  Ask about your medical history.  Ask questions about your diet.  Ask about symptoms such as:  Have you felt weak?  Do you feel unusually tired?  Do you get dizzy?  Order blood tests. These may include a test to:  Check the level of red cells in your blood.  Measure B12 levels.  Check for the presence of intrinsic factor. VITAMIN B12 INJECTIONS How often you will need a vitamin B12 injection will depend on how severe your deficiency is. This also will affect how long you will need to get them. People with pernicious anemia usually get injections for their entire life. Others might get them for a shorter period. For many people, injections are given daily or weekly for several weeks. Then, once B12 levels are normal, injections are given just once a month. If the cause of the deficiency can be fixed, the injections can be stopped. Talk with your healthcare provider about what you should expect. For an injection:  The injection site will be cleaned with an alcohol swab.  Your healthcare provider will insert a needle directly into a muscle. Most any muscle can be used. Most often, an arm muscle is used. A buttocks muscle can also be used. Many people say shots in that area are less painful.  A small adhesive bandage may be put over the injection site. It usually can be taken off in an hour or less. Injections can be given by your healthcare provider. In some cases, family members give them. Sometimes, people give them to themselves. Talk with your healthcare provider about what would be best for you. If someone other than your healthcare provider will be giving the shots, the person will need to be trained to give them correctly. HOME CARE INSTRUCTIONS   You can remove the adhesive bandage within an hour of getting a  shot.  You should be able to go about your normal activities right away.  Avoid drinking large amounts of alcohol while taking vitamin B12 shots. Alcohol can interfere with the body's use of the vitamin. SEEK MEDICAL CARE IF:   Pain, redness, swelling or tenderness at the injection site does not get better or gets worse.  Headache, dizziness or weakness does not go away.  You develop a fever of more than 100.5 F (38.1 C). SEEK IMMEDIATE MEDICAL CARE IF:   You have chest pain.  You develop shortness of breath.  You have muscle weakness that gets worse.  You develop numbness, weakness or tingling on one side or one area of the body.  You have symptoms of an allergic reaction, such as:  Hives.  Difficulty breathing.  Swelling of the lips, face, tongue or throat.  You develop a fever of more than 102.0 F (38.9 C). MAKE SURE YOU:   Understand these instructions.  Will watch your condition.  Will get help right away if you are not doing well or get worse. Document   Released: 03/19/2008 Document Revised: 03/15/2011 Document Reviewed: 03/19/2008 [employer] Patient Information 2014 [employer].  

## 2012-08-23 NOTE — Progress Notes (Signed)
Patient tolerated well.

## 2012-09-27 ENCOUNTER — Ambulatory Visit (INDEPENDENT_AMBULATORY_CARE_PROVIDER_SITE_OTHER): Payer: Medicare Other | Admitting: Family Medicine

## 2012-09-27 ENCOUNTER — Encounter: Payer: Self-pay | Admitting: Family Medicine

## 2012-09-27 VITALS — BP 157/78 | HR 67 | Temp 97.7°F | Ht 69.0 in | Wt 174.0 lb

## 2012-09-27 DIAGNOSIS — K219 Gastro-esophageal reflux disease without esophagitis: Secondary | ICD-10-CM

## 2012-09-27 DIAGNOSIS — J4489 Other specified chronic obstructive pulmonary disease: Secondary | ICD-10-CM

## 2012-09-27 DIAGNOSIS — Z780 Asymptomatic menopausal state: Secondary | ICD-10-CM

## 2012-09-27 DIAGNOSIS — G3184 Mild cognitive impairment, so stated: Secondary | ICD-10-CM

## 2012-09-27 DIAGNOSIS — E785 Hyperlipidemia, unspecified: Secondary | ICD-10-CM

## 2012-09-27 DIAGNOSIS — M129 Arthropathy, unspecified: Secondary | ICD-10-CM

## 2012-09-27 DIAGNOSIS — R413 Other amnesia: Secondary | ICD-10-CM

## 2012-09-27 DIAGNOSIS — F411 Generalized anxiety disorder: Secondary | ICD-10-CM

## 2012-09-27 DIAGNOSIS — E039 Hypothyroidism, unspecified: Secondary | ICD-10-CM

## 2012-09-27 DIAGNOSIS — J449 Chronic obstructive pulmonary disease, unspecified: Secondary | ICD-10-CM

## 2012-09-27 DIAGNOSIS — E538 Deficiency of other specified B group vitamins: Secondary | ICD-10-CM

## 2012-09-27 LAB — POCT CBC
Hemoglobin: 13.8 g/dL (ref 12.2–16.2)
MCH, POC: 30.5 pg (ref 27–31.2)
MCHC: 33.9 g/dL (ref 31.8–35.4)
MPV: 8.2 fL (ref 0–99.8)
POC Granulocyte: 3 (ref 2–6.9)
RBC: 4.5 M/uL (ref 4.04–5.48)
WBC: 5.5 10*3/uL (ref 4.6–10.2)

## 2012-09-27 NOTE — Progress Notes (Signed)
Subjective:    Patient ID: [name], female    DOB: [birth year], 72 y.o.   MRN: [id number]  HPI Pt here for follow up and management of chronic medical problems. Home health maintenance parameters it appears that she is due a T-dap and a flu shot .  Patient Active Problem List   Diagnosis Date Noted  . Trigeminal neuralgia 04/02/2011  . Dysphagia 07/23/2010  . GERD (gastroesophageal reflux disease) 07/23/2010  . Right facial pain 07/23/2010  . ANXIETY 05/24/2008  . COPD 05/24/2008  . CONSTIPATION 05/24/2008  . ARTHRITIS 05/24/2008  . IDIOPATHIC OSTEOPOROSIS 05/24/2008  . ADENOCARCINOMA, BREAST 05/22/2008  . HYPOTHYROIDISM 05/22/2008  . HYPERLIPIDEMIA 05/22/2008  . INTERNAL HEMORRHOIDS 05/22/2008  . GERD 05/22/2008  . HEADACHE 05/22/2008  . OTHER DYSPHAGIA 05/22/2008  . COLONIC POLYPS, ADENOMATOUS, HX OF 05/22/2008   Outpatient Encounter Prescriptions as of 09/27/2012  Medication Sig Dispense Refill  . aspirin 81 MG tablet Take 81 mg by mouth daily.        . Calcium Carbonate-Vitamin D (CALCIUM 500/D) 500-125 MG-UNIT TABS Take 1 capsule by mouth daily.        . Cholecalciferol (VITAMIN D3) 2000 UNITS capsule Take 4,000 Units by mouth daily.       Marland Kitchen donepezil (ARICEPT) 10 MG tablet Take 10 mg by mouth daily.      Marland Kitchen ezetimibe (ZETIA) 10 MG tablet Take 1 tablet (10 mg total) by mouth daily.  35 tablet  0  . gabapentin (NEURONTIN) 300 MG capsule Takes 1 tab in am and 2 tabs at night      . ibuprofen (ADVIL,MOTRIN) 200 MG tablet Take 400 mg by mouth every 6 (six) hours as needed.      . lansoprazole (PREVACID) 15 MG capsule Take 15 mg by mouth as needed. For heartburn      . levothyroxine (SYNTHROID, LEVOTHROID) 125 MCG tablet Take 125 mcg by mouth daily.        Marland Kitchen lisinopril-hydrochlorothiazide (PRINZIDE,ZESTORETIC) 10-12.5 MG per tablet TAKE (1) TABLET BY MOUTH DAILY.  30 tablet  3  . Multiple Vitamin (MULTIVITAMIN) capsule Take 1 capsule by mouth daily.        . nortriptyline  (PAMELOR) 25 MG capsule Take 3 capsules by mouth at bedtime and may repeat dose one time if needed.      . Omega-3 Fatty Acids (FISH OIL) 1000 MG CAPS Take 1 capsule by mouth daily.        Marland Kitchen senna-docusate (SENOKOT S) 8.6-50 MG per tablet Take 1 tablet by mouth daily as needed for constipation.       Facility-Administered Encounter Medications as of 09/27/2012  Medication Dose Route Frequency Provider Last Rate Last Dose  . cyanocobalamin ((VITAMIN B-12)) injection 1,000 mcg  1,000 mcg Intramuscular Q30 days [name], MD   1,000 mcg at 08/23/12 1659      Review of Systems  Constitutional: Negative.   HENT: Positive for postnasal drip.   Eyes: Negative.   Respiratory: Negative.   Cardiovascular: Negative.   Gastrointestinal: Negative.   Endocrine: Negative.   Genitourinary: Negative.   Musculoskeletal: Negative.   Skin: Negative.   Allergic/Immunologic: Negative.   Neurological: Positive for dizziness.  Hematological: Negative.   Psychiatric/Behavioral: Negative.    Patient still sees the neurologist in [place]. He follows her for the trigeminal neuralgia and recently got her started on Aricept. She is post mastectomy since 1996 on the left side. She still sees the oncologist in [place].    Objective:  Physical Exam  Nursing note and vitals reviewed. Constitutional: She is oriented to person, place, and time. She appears well-developed and well-nourished. No distress.  HENT:  Head: Normocephalic and atraumatic.  Right Ear: External ear normal.  Left Ear: External ear normal.  Nose: Nose normal.  Mouth/Throat: Oropharynx is clear and moist. No oropharyngeal exudate.  Eyes: Conjunctivae and EOM are normal. Pupils are equal, round, and reactive to light. Right eye exhibits no discharge. Left eye exhibits no discharge. No scleral icterus.  Neck: Normal range of motion. Neck supple. No thyromegaly present.  Cardiovascular: Normal rate, regular rhythm and normal heart  sounds.   No murmur heard. At 72 per minute  Pulmonary/Chest: Effort normal and breath sounds normal. No respiratory distress. She has no wheezes. She has no rales.  Abdominal: Soft. Bowel sounds are normal. She exhibits no mass. There is no tenderness. There is no rebound and no guarding.  Musculoskeletal: Normal range of motion. She exhibits no edema.  Lymphadenopathy:    She has no cervical adenopathy.  Neurological: She is alert and oriented to person, place, and time. She has normal reflexes.  Skin: Skin is warm and dry.  Psychiatric: She has a normal mood and affect. Her behavior is normal. Judgment and thought content normal.   BP 157/78  Pulse 67  Temp(Src) 97.7 F (36.5 C) (Oral)  Ht 5\' 9"  (1.753 m)  Wt 174 lb (78.926 kg)  BMI 25.68 kg/m2        Assessment & Plan:   1. HYPOTHYROIDISM   2. HYPERLIPIDEMIA   3. ANXIETY   4. COPD   5. GERD   6. ARTHRITIS   7. B12 deficiency   8. Postmenopausal status (age-related) (natural)   9. Mild cognitive impairment with memory loss    Orders Placed This Encounter  Procedures  . DG Bone Density    Standing Status: Future     Number of Occurrences:      Standing Expiration Date: 11/27/2013    Order Specific Question:  Reason for Exam (SYMPTOM  OR DIAGNOSIS REQUIRED)    Answer:  postmenapausal state    Order Specific Question:  Preferred imaging location?    Answer:  Internal  . Hepatic function panel  . BMP8+EGFR  . NMR, lipoprofile  . Vit D  25 hydroxy (rtn osteoporosis monitoring)  . POCT CBC   Meds ordered this encounter  Medications  . donepezil (ARICEPT) 10 MG tablet    Sig: Take 10 mg by mouth daily.   Patient Instructions  Check with insurance to see if you can get your tetanus shot as is his past due We will give you a flu shot today Continue aggressive therapeutic lifestyle changes which include diet and exercise Do not put herself at risk for falls Return to clinic as planned for her pelvic exam Do not  forget to get your eye exam   Nyra Capes MD

## 2012-09-27 NOTE — Patient Instructions (Signed)
Check with insurance to see if you can get your tetanus shot as is his past due We will give you a flu shot today Continue aggressive therapeutic lifestyle changes which include diet and exercise Do not put herself at risk for falls Return to clinic as planned for her pelvic exam Do not forget to get your eye exam

## 2012-09-29 LAB — HEPATIC FUNCTION PANEL
ALT: 22 IU/L (ref 0–32)
Alkaline Phosphatase: 62 IU/L (ref 39–117)
Bilirubin, Direct: 0.12 mg/dL (ref 0.00–0.40)
Total Bilirubin: 0.4 mg/dL (ref 0.0–1.2)
Total Protein: 6.3 g/dL (ref 6.0–8.5)

## 2012-09-29 LAB — NMR, LIPOPROFILE
Cholesterol: 243 mg/dL — ABNORMAL HIGH (ref <200)
HDL Cholesterol by NMR: 71 mg/dL (ref >=40)
LDL Particle Number: 2212 nmol/L — ABNORMAL HIGH (ref <1000)
LDL Size: 20.7 nm (ref >20.5)
Small LDL Particle Number: 886 nmol/L — ABNORMAL HIGH (ref <=527)
Triglycerides by NMR: 126 mg/dL (ref <150)

## 2012-09-29 LAB — BMP8+EGFR
Calcium: 9.3 mg/dL (ref 8.6–10.2)
Chloride: 100 mmol/L (ref 97–108)
GFR calc Af Amer: 107 mL/min/{1.73_m2} (ref >59)
GFR calc non Af Amer: 93 mL/min/{1.73_m2} (ref >59)
Potassium: 4.2 mmol/L (ref 3.5–5.2)
Sodium: 143 mmol/L (ref 134–144)

## 2012-10-06 ENCOUNTER — Other Ambulatory Visit (INDEPENDENT_AMBULATORY_CARE_PROVIDER_SITE_OTHER): Payer: Medicare Other

## 2012-10-06 ENCOUNTER — Telehealth: Payer: Self-pay | Admitting: Family Medicine

## 2012-10-06 DIAGNOSIS — Z1212 Encounter for screening for malignant neoplasm of rectum: Secondary | ICD-10-CM

## 2012-10-06 NOTE — Telephone Encounter (Signed)
I do not see Antivert on her med list in EMR or paper chart. Left message on patient's home phone for her to return my call.

## 2012-10-16 NOTE — Telephone Encounter (Signed)
Unable to reach patient by phone 

## 2012-10-17 ENCOUNTER — Encounter: Payer: Self-pay | Admitting: *Deleted

## 2012-10-18 ENCOUNTER — Other Ambulatory Visit: Payer: Medicare Other

## 2012-10-18 ENCOUNTER — Ambulatory Visit: Payer: Medicare Other

## 2012-10-19 ENCOUNTER — Ambulatory Visit: Payer: Medicare Other

## 2012-10-24 ENCOUNTER — Ambulatory Visit (INDEPENDENT_AMBULATORY_CARE_PROVIDER_SITE_OTHER): Payer: Medicare Other | Admitting: Nurse Practitioner

## 2012-10-24 ENCOUNTER — Encounter: Payer: Self-pay | Admitting: Nurse Practitioner

## 2012-10-24 VITALS — BP 165/85 | HR 89 | Temp 97.0°F | Ht 69.0 in | Wt 176.0 lb

## 2012-10-24 DIAGNOSIS — Z01419 Encounter for gynecological examination (general) (routine) without abnormal findings: Secondary | ICD-10-CM

## 2012-10-24 DIAGNOSIS — Z124 Encounter for screening for malignant neoplasm of cervix: Secondary | ICD-10-CM

## 2012-10-24 DIAGNOSIS — Z23 Encounter for immunization: Secondary | ICD-10-CM

## 2012-10-24 LAB — POCT URINALYSIS DIPSTICK
Bilirubin, UA: NEGATIVE
Ketones, UA: NEGATIVE
Nitrite, UA: NEGATIVE
Urobilinogen, UA: NEGATIVE
pH, UA: 7.5

## 2012-10-24 LAB — POCT UA - MICROSCOPIC ONLY
Casts, Ur, LPF, POC: NEGATIVE
Mucus, UA: NEGATIVE
Yeast, UA: NEGATIVE

## 2012-10-24 NOTE — Patient Instructions (Signed)

## 2012-10-24 NOTE — Addendum Note (Signed)
Addended by: Roselyn Reef on: 10/24/2012 04:35 PM   Modules accepted: Orders

## 2012-10-24 NOTE — Progress Notes (Signed)
  Subjective:    Patient ID: [name], female    DOB: 1940/05/07, 72 y.o.   MRN: [id number]  HPI [name]  Is a regular patient of Dr. Christell Constant that was sent to me today for PAP only- She recently saw him for follow up about 3 weeks ago- [facility] is doing well today with no complaints.    Review of Systems  Constitutional: Negative.   HENT: Negative.   Respiratory: Negative.   Cardiovascular: Negative.   Gastrointestinal: Negative.   Musculoskeletal: Negative.        Objective:   Physical Exam  Constitutional: She is oriented to person, place, and time. She appears well-developed and well-nourished.  HENT:  Head: Normocephalic.  Right Ear: Hearing, tympanic membrane, external ear and ear canal normal.  Left Ear: Hearing, tympanic membrane, external ear and ear canal normal.  Nose: Nose normal.  Mouth/Throat: Uvula is midline and oropharynx is clear and moist.  Eyes: Conjunctivae and EOM are normal. Pupils are equal, round, and reactive to light.  Neck: Normal range of motion and full passive range of motion without pain. Neck supple. No JVD present. Carotid bruit is not present. No mass and no thyromegaly present.  Cardiovascular: Normal rate, normal heart sounds and intact distal pulses.   No murmur heard. Pulmonary/Chest: Effort normal and breath sounds normal. Right breast exhibits no inverted nipple, no mass, no nipple discharge, no skin change and no tenderness. Left breast exhibits no inverted nipple, no mass, no nipple discharge, no skin change and no tenderness.  Abdominal: Soft. Bowel sounds are normal. She exhibits no mass. There is no tenderness.  Genitourinary: Vagina normal and uterus normal. No breast swelling, tenderness, discharge or bleeding.  bimanual exam-No adnexal masses or tenderness. Cervix parous and pink- no discharge  Musculoskeletal: Normal range of motion.  Lymphadenopathy:    She has no cervical adenopathy.  Neurological: She is alert and oriented to  person, place, and time.  Skin: Skin is warm and dry.  Psychiatric: She has a normal mood and affect. Her behavior is normal. Judgment and thought content normal.    BP 165/85  Pulse 89  Temp(Src) 97 F (36.1 C) (Oral)  Ht 5\' 9"  (1.753 m)  Wt 176 lb (79.833 kg)  BMI 25.98 kg/m2       Assessment & Plan:   1. Encounter for routine gynecological examination    Orders Placed This Encounter  Procedures  . POCT UA - Microscopic Only  . POCT urinalysis dipstick    Continue all meds Labs pending Diet and exercise encouraged Health maintenance reviewed Keep follow up appointment with Dr. Cristela Felt, FNP

## 2012-10-25 ENCOUNTER — Ambulatory Visit (INDEPENDENT_AMBULATORY_CARE_PROVIDER_SITE_OTHER): Payer: Medicare Other

## 2012-10-25 ENCOUNTER — Ambulatory Visit (INDEPENDENT_AMBULATORY_CARE_PROVIDER_SITE_OTHER): Payer: Medicare Other | Admitting: Pharmacist

## 2012-10-25 VITALS — Ht 67.0 in | Wt 176.8 lb

## 2012-10-25 DIAGNOSIS — Z780 Asymptomatic menopausal state: Secondary | ICD-10-CM

## 2012-10-25 DIAGNOSIS — M8580 Other specified disorders of bone density and structure, unspecified site: Secondary | ICD-10-CM | POA: Insufficient documentation

## 2012-10-25 DIAGNOSIS — M899 Disorder of bone, unspecified: Secondary | ICD-10-CM

## 2012-10-25 NOTE — Progress Notes (Signed)
Patient ID: [name], female   DOB: 1940/03/08, 72 y.o.   MRN: [id number] [facility] Current Height: Height: 5\' 7"  (170.2 cm)      Max Lifetime Height:  5\' 10"  Current Weight: Weight: 176 lb 12 oz (80.173 kg)       Ethnicity:Caucasian    HPI: Does pt already have a diagnosis of:  Osteopenia?  Yes Osteoporosis?  No  Back Pain?  Yes - has had two back surgeries    Kyphosis?  No Prior fracture?  No Med(s) for Osteoporosis/Osteopenia:  Calcium and viatmin D Med(s) previously tried for Osteoporosis/Osteopenia:  none                                                             PMH: Age at menopause:  72yo Hysterectomy?  No Oophorectomy?  No HRT? Yes - Former.  Type/duration: estrogen for 1 year Steroid Use?  No Thyroid med?  Yes History of cancer?  Yes - breast cancer History of digestive disorders (ie Crohn's)?  Yes - GERD.  Takes prevacid prn, occasionally esophagus stretched Current or previous eating disorders?  No Last Vitamin D Result:  45.1 (09/2012) Last GFR Result:  93 (09/2012)   FH/SH: Family history of osteoporosis?  No Parent with history of hip fracture?  No Family history of breast cancer?  No Exercise?  Yes - walking and yardwork Smoking?  No Alcohol?  No    Calcium Assessment Calcium Intake  # of servings/day  Calcium mg  Milk (8 oz) 1 - 2  x  300  = 300mg  - 600mg   Yogurt (4 oz) 0.5 x  200 = 100mg   Cheese (1 oz) 0 x  200 = 0  Other Calcium sources   250mg   Ca supplement 1 calcium and 1 MVI = 1000mg    Estimated calcium intake per day 1600mg  -1900mg     DEXA Results Date of Test T-Score for AP Spine L1-L4 T-Score for Total Left Hip T-Score for Total Right Hip  10/25/2012 -0.3 -0.4 -0.3  01/30/2007 -0.8 -0.3 0.0  03/01/2005 -1.4 -0.4 -0.1  10/01/2002 -1.6 -0.4 --   T-Score for neck of right hip was -1.2 today T-Score for neck of left hip was -1.1 today  FRAX 10 year estimate: Total FX risk:  10%  (consider medication if >/= 20%) Hip FX  risk:  1.7%  (consider medication if >/= 3%)  Assessment: Osteopenia - low estimated FRAX estimate  Recommendations: 1.  Discussed DEXA results and fracture risk 2.  recommend calcium 1200mg  daily through supplementation or diet.  3.  recommend weight bearing exercise - 30 minutes at least 4 days per week.   4.  Counseled and educated about fall risk and prevention.  Recheck DEXA:  2 years  Time spent counseling patient:  20 minutes  Henrene Pastor, PharmD, CPP

## 2012-10-25 NOTE — Patient Instructions (Signed)

## 2012-10-27 LAB — PAP IG (IMAGE GUIDED): PAP Smear Comment: 0

## 2012-11-02 ENCOUNTER — Ambulatory Visit: Payer: Medicare Other

## 2012-11-03 ENCOUNTER — Ambulatory Visit (INDEPENDENT_AMBULATORY_CARE_PROVIDER_SITE_OTHER): Payer: Medicare Other | Admitting: *Deleted

## 2012-11-03 DIAGNOSIS — E538 Deficiency of other specified B group vitamins: Secondary | ICD-10-CM

## 2012-11-23 ENCOUNTER — Other Ambulatory Visit: Payer: Self-pay | Admitting: Family Medicine

## 2012-12-06 ENCOUNTER — Ambulatory Visit (INDEPENDENT_AMBULATORY_CARE_PROVIDER_SITE_OTHER): Payer: Medicare Other | Admitting: *Deleted

## 2012-12-06 DIAGNOSIS — E538 Deficiency of other specified B group vitamins: Secondary | ICD-10-CM

## 2012-12-06 NOTE — Patient Instructions (Signed)
Vitamin B12 Injections Every person needs vitamin B12. A deficiency develops when the body does not get enough of it. One way to overcome this is by getting B12 shots (injections). A B12 shot puts the vitamin directly into muscle tissue. This avoids any problems your body might have in absorbing it from food or a pill. In some people, the body has trouble using the vitamin correctly. This can cause a B12 deficiency. Not consuming enough of the vitamin can also cause a deficiency. Getting enough vitamin B12 can be hard for elderly people. Sometimes, they do not eat a well-balanced diet. The elderly are also more likely than younger people to have medical conditions or take medications that can lead to a deficiency. WHAT DOES VITAMIN B12 DO? Vitamin B12 does many things to help the body work right:  It helps the body make healthy red blood cells.  It helps maintain nerve cells.  It is involved in the body's process of converting food into energy (metabolism).  It is needed to make the genetic material in all cells (DNA). VITAMIN B12 FOOD SOURCES Most people get plenty of vitamin B12 through the foods they eat. It is present in:  Meat, fish, poultry, and eggs.  Milk and milk products.  It also is added when certain foods are made, including some breads, cereals and yogurts. The food is then called "fortified". CAUSES The most common causes of vitamin B12 deficiency are:  Pernicious anemia. The condition develops when the body cannot make enough healthy red blood cells. This stems from a lack of a protein made in the stomach (intrinsic factor). People without this protein cannot absorb enough vitamin B12 from food.  Malabsorption. This is when the body cannot absorb the vitamin. It can be caused by:  Pernicious anemia.  Surgery to remove part or all of the stomach can lead to malabsorption. Removal of part or all of the small intestine can also cause malabsorption.  Vegetarian diet.  People who are strict about not eating foods from animals could have trouble taking in enough vitamin B12 from diet alone.  Medications. Some medicines have been linked to B12 deficiency, such as Metformin (a drug prescribed for type 2 diabetes). Long-term use of stomach acid suppressants also can keep the vitamin from being absorbed.  Intestinal problems such as inflammatory bowel disease. If there are problems in the digestive tract, vitamin B12 may not be absorbed in good enough amounts. SYMPTOMS People who do not get enough B12 can develop problems. These can include:  Anemia. This is when the body has too few red blood cells. Red blood cells carry oxygen to the rest of the body. Without a healthy supply of red blood cells, people can feel:  Tired (fatigued).  Weak.  Severe anemia can cause:  Shortness of breath.  Dizziness.  Rapid heart rate.  Paleness.  Other Vitamin B12 deficiency symptoms include:  Diarrhea.  Numbness or tingling in the hands or feet.  Loss of appetite.  Confusion.  Sores on the tongue or in the mouth. LET YOUR CAREGIVER KNOW ABOUT:  Any allergies. It is very important to know if you are allergic or sensitive to cobalt. Vitamin B12 contains cobalt.  Any history of kidney disease.  All medications you are taking. Include prescription and over-the-counter medicines, herbs and creams.  Whether you are pregnant or breast-feeding.  If you have Leber's disease, a hereditary eye condition, vitamin B12 could make it worse. RISKS AND COMPLICATIONS Reactions to an injection are   usually temporary. They might include:  Pain at the injection site.  Redness, swelling or tenderness at the site.  Headache, dizziness or weakness.  Nausea, upset stomach or diarrhea.  Numbness or tingling.  Fever.  Joint pain.  Itching or rash. If a reaction does not go away in a short while, talk with your healthcare provider. A change in the way the shots are  given, or where they are given, might need to be made. BEFORE AN INJECTION To decide whether B12 injections are right for you, your healthcare provider will probably:  Ask about your medical history.  Ask questions about your diet.  Ask about symptoms such as:  Have you felt weak?  Do you feel unusually tired?  Do you get dizzy?  Order blood tests. These may include a test to:  Check the level of red cells in your blood.  Measure B12 levels.  Check for the presence of intrinsic factor. VITAMIN B12 INJECTIONS How often you will need a vitamin B12 injection will depend on how severe your deficiency is. This also will affect how long you will need to get them. People with pernicious anemia usually get injections for their entire life. Others might get them for a shorter period. For many people, injections are given daily or weekly for several weeks. Then, once B12 levels are normal, injections are given just once a month. If the cause of the deficiency can be fixed, the injections can be stopped. Talk with your healthcare provider about what you should expect. For an injection:  The injection site will be cleaned with an alcohol swab.  Your healthcare provider will insert a needle directly into a muscle. Most any muscle can be used. Most often, an arm muscle is used. A buttocks muscle can also be used. Many people say shots in that area are less painful.  A small adhesive bandage may be put over the injection site. It usually can be taken off in an hour or less. Injections can be given by your healthcare provider. In some cases, family members give them. Sometimes, people give them to themselves. Talk with your healthcare provider about what would be best for you. If someone other than your healthcare provider will be giving the shots, the person will need to be trained to give them correctly. HOME CARE INSTRUCTIONS   You can remove the adhesive bandage within an hour of getting a  shot.  You should be able to go about your normal activities right away.  Avoid drinking large amounts of alcohol while taking vitamin B12 shots. Alcohol can interfere with the body's use of the vitamin. SEEK MEDICAL CARE IF:   Pain, redness, swelling or tenderness at the injection site does not get better or gets worse.  Headache, dizziness or weakness does not go away.  You develop a fever of more than 100.5 F (38.1 C). SEEK IMMEDIATE MEDICAL CARE IF:   You have chest pain.  You develop shortness of breath.  You have muscle weakness that gets worse.  You develop numbness, weakness or tingling on one side or one area of the body.  You have symptoms of an allergic reaction, such as:  Hives.  Difficulty breathing.  Swelling of the lips, face, tongue or throat.  You develop a fever of more than 102.0 F (38.9 C). MAKE SURE YOU:   Understand these instructions.  Will watch your condition.  Will get help right away if you are not doing well or get worse. Document   Released: 03/19/2008 Document Revised: 03/15/2011 Document Reviewed: 03/19/2008 [employer] Patient Information 2014 [employer].  

## 2012-12-06 NOTE — Progress Notes (Signed)
VITAMIN B12 INJECTION GIVEN AND TOLERATED WELL.

## 2012-12-20 ENCOUNTER — Other Ambulatory Visit: Payer: Self-pay | Admitting: Family Medicine

## 2013-01-02 ENCOUNTER — Ambulatory Visit: Payer: Medicare Other | Admitting: Family Medicine

## 2013-01-11 ENCOUNTER — Ambulatory Visit (INDEPENDENT_AMBULATORY_CARE_PROVIDER_SITE_OTHER): Payer: Medicare Other | Admitting: Family Medicine

## 2013-01-11 ENCOUNTER — Encounter: Payer: Self-pay | Admitting: Family Medicine

## 2013-01-11 VITALS — BP 157/78 | HR 92 | Temp 97.1°F | Ht 67.0 in | Wt 175.0 lb

## 2013-01-11 DIAGNOSIS — Z23 Encounter for immunization: Secondary | ICD-10-CM

## 2013-01-11 DIAGNOSIS — R490 Dysphonia: Secondary | ICD-10-CM

## 2013-01-11 DIAGNOSIS — K219 Gastro-esophageal reflux disease without esophagitis: Secondary | ICD-10-CM

## 2013-01-11 DIAGNOSIS — E039 Hypothyroidism, unspecified: Secondary | ICD-10-CM

## 2013-01-11 DIAGNOSIS — J449 Chronic obstructive pulmonary disease, unspecified: Secondary | ICD-10-CM

## 2013-01-11 DIAGNOSIS — E785 Hyperlipidemia, unspecified: Secondary | ICD-10-CM

## 2013-01-11 NOTE — Addendum Note (Signed)
Addended by: Zannie Cove on: 01/11/2013 05:12 PM   Modules accepted: Orders

## 2013-01-11 NOTE — Patient Instructions (Addendum)
Continue current medications. Continue good therapeutic lifestyle changes which include good diet and exercise. Fall precautions discussed with patient. Schedule your flu vaccine if you haven't had it yet If you are over 72 years old - you may need Prevnar 50 or the adult Pneumonia vaccine. The Prevnar vaccine may make your arm sore. We will arrange a visit for you to see the ear nose and throat specialist regarding your persistent hoarseness. Please take a copy of the chest x-ray report reviewed with the visit When your lab results are returned we would also like to take a copy of that report to your visit also Drink plenty of fluids Continue to use a cool mist humidifier Discuss with the neurologist about the possibility of reducing the Pamelor

## 2013-01-11 NOTE — Addendum Note (Signed)
Addended by: Zannie Cove on: 01/11/2013 05:05 PM   Modules accepted: Orders

## 2013-01-11 NOTE — Progress Notes (Signed)
Subjective:    Patient ID: [name], female    DOB: 1940/04/28, 73 y.o.   MRN: [id number]  HPI Pt here for follow up and management of chronic medical problems. Patient complains of some mild hoarseness and voice change. All of her health maintenance parameters appear to be up-to-date except for her knee of 18 gap and a Prevnar. She does take a try cyclic antidepressant at bedtime and this could be part of the reason for her hoarseness and her dry mouth.      Patient Active Problem List   Diagnosis Date Noted  . Osteopenia 10/25/2012  . Mild cognitive impairment with memory loss 09/27/2012  . Trigeminal neuralgia 04/02/2011  . Dysphagia 07/23/2010  . GERD (gastroesophageal reflux disease) 07/23/2010  . Right facial pain 07/23/2010  . ANXIETY 05/24/2008  . COPD 05/24/2008  . CONSTIPATION 05/24/2008  . ARTHRITIS 05/24/2008  . IDIOPATHIC OSTEOPOROSIS 05/24/2008  . ADENOCARCINOMA, BREAST 05/22/2008  . HYPOTHYROIDISM 05/22/2008  . HYPERLIPIDEMIA 05/22/2008  . INTERNAL HEMORRHOIDS 05/22/2008  . GERD 05/22/2008  . HEADACHE 05/22/2008  . OTHER DYSPHAGIA 05/22/2008  . COLONIC POLYPS, ADENOMATOUS, HX OF 05/22/2008   Outpatient Encounter Prescriptions as of 01/11/2013  Medication Sig  . aspirin 81 MG tablet Take 81 mg by mouth daily.    . Calcium Carbonate-Vitamin D (CALCIUM 500/D) 500-125 MG-UNIT TABS Take 1 capsule by mouth daily.    . Cholecalciferol (VITAMIN D3) 2000 UNITS capsule Take 4,000 Units by mouth daily.   Marland Kitchen donepezil (ARICEPT) 10 MG tablet Take 10 mg by mouth daily.  Marland Kitchen ezetimibe (ZETIA) 10 MG tablet Take 1 tablet (10 mg total) by mouth daily.  Marland Kitchen gabapentin (NEURONTIN) 300 MG capsule Takes 1 tab in am and 2 tabs at night  . ibuprofen (ADVIL,MOTRIN) 200 MG tablet Take 400 mg by mouth every 6 (six) hours as needed.  . lansoprazole (PREVACID) 15 MG capsule Take 15 mg by mouth as needed. For heartburn  . levothyroxine (SYNTHROID, LEVOTHROID) 125 MCG tablet TAKE (1) TABLET  BY MOUTH DAILY.  Marland Kitchen lisinopril-hydrochlorothiazide (PRINZIDE,ZESTORETIC) 10-12.5 MG per tablet TAKE (1) TABLET BY MOUTH DAILY.  . Multiple Vitamin (MULTIVITAMIN) capsule Take 1 capsule by mouth daily.    . nortriptyline (PAMELOR) 25 MG capsule Take 3 capsules by mouth at bedtime and may repeat dose one time if needed.  . Omega-3 Fatty Acids (FISH OIL) 1000 MG CAPS Take 1 capsule by mouth daily.    Marland Kitchen senna-docusate (SENOKOT S) 8.6-50 MG per tablet Take 1 tablet by mouth daily as needed for constipation.      Review of Systems  Constitutional: Negative.   HENT: Positive for voice change (hoarseness).   Eyes: Negative.   Respiratory: Negative.   Cardiovascular: Negative.   Gastrointestinal: Negative.   Endocrine: Negative.   Genitourinary: Negative.   Musculoskeletal: Negative.   Skin: Negative.   Allergic/Immunologic: Negative.   Neurological: Negative.   Hematological: Negative.   Psychiatric/Behavioral: Negative.        Objective:   Physical Exam  Nursing note and vitals reviewed. Constitutional: She is oriented to person, place, and time. She appears well-developed and well-nourished. No distress.  HENT:  Head: Normocephalic and atraumatic.  Right Ear: External ear normal.  Left Ear: External ear normal.  Nose: Nose normal.  Mouth/Throat: Oropharynx is clear and moist. No oropharyngeal exudate.  Eyes: Conjunctivae and EOM are normal. Pupils are equal, round, and reactive to light. Right eye exhibits no discharge. Left eye exhibits no discharge. No scleral icterus.  Neck:  Normal range of motion. Neck supple. No thyromegaly present.  No carotid bruits  Cardiovascular: Normal rate, regular rhythm, normal heart sounds and intact distal pulses.  Exam reveals no gallop and no friction rub.   No murmur heard. At 72 per minute  Pulmonary/Chest: Effort normal and breath sounds normal. No respiratory distress. She has no wheezes. She has no rales. She exhibits no tenderness.  Dry  cough  Abdominal: Soft. Bowel sounds are normal. She exhibits no mass. There is no tenderness. There is no rebound and no guarding.  Musculoskeletal: Normal range of motion. She exhibits no edema and no tenderness.  Lymphadenopathy:    She has no cervical adenopathy.  Neurological: She is alert and oriented to person, place, and time. She has normal reflexes.  Patient says her memory seems to be stable.  Skin: Skin is warm and dry. No rash noted.  Psychiatric: She has a normal mood and affect. Her behavior is normal. Judgment and thought content normal.   BP 157/78  Pulse 92  Temp(Src) 97.1 F (36.2 C) (Oral)  Ht 5\' 7"  (1.702 m)  Wt 175 lb (79.379 kg)  BMI 27.40 kg/m2        Assessment & Plan:  1. COPD  2. GERD  3. HYPERLIPIDEMIA  4. HYPOTHYROIDISM  5. Hoarseness of voice  Lab work was drawn this morning A referral will be done to the ear nose and throat specialist for her hoarseness  Patient Instructions  Continue current medications. Continue good therapeutic lifestyle changes which include good diet and exercise. Fall precautions discussed with patient. Schedule your flu vaccine if you haven't had it yet If you are over 53 years old - you may need Prevnar 33 or the adult Pneumonia vaccine. The Prevnar vaccine may make your arm sore. We will arrange a visit for you to see the ear nose and throat specialist regarding your persistent hoarseness. Please take a copy of the chest x-ray report reviewed with the visit When your lab results are returned we would also like to take a copy of that report to your visit also Drink plenty of fluids Continue to use a cool mist humidifier Discuss with the neurologist about the possibility of reducing the Pamelor    Arrie Senate MD

## 2013-01-16 ENCOUNTER — Ambulatory Visit (INDEPENDENT_AMBULATORY_CARE_PROVIDER_SITE_OTHER): Payer: Medicare Other

## 2013-01-16 DIAGNOSIS — E538 Deficiency of other specified B group vitamins: Secondary | ICD-10-CM

## 2013-01-18 ENCOUNTER — Other Ambulatory Visit: Payer: Self-pay | Admitting: Family Medicine

## 2013-01-26 ENCOUNTER — Telehealth: Payer: Self-pay | Admitting: Oncology

## 2013-01-26 NOTE — Telephone Encounter (Signed)
pt called to r/s appt to later d.t. done...pt ok adn aware

## 2013-01-29 ENCOUNTER — Ambulatory Visit: Payer: Medicare Other | Admitting: Oncology

## 2013-01-29 ENCOUNTER — Telehealth: Payer: Self-pay | Admitting: Family Medicine

## 2013-01-29 DIAGNOSIS — R490 Dysphonia: Secondary | ICD-10-CM

## 2013-01-29 NOTE — Telephone Encounter (Signed)
appt is with Dr Redmond Pulling 02/02/13 at 915 am  [place] [id number]   Busy line - cant reach pt-jhb

## 2013-02-05 ENCOUNTER — Other Ambulatory Visit (INDEPENDENT_AMBULATORY_CARE_PROVIDER_SITE_OTHER): Payer: Medicare Other

## 2013-02-05 ENCOUNTER — Ambulatory Visit: Payer: Medicare Other | Admitting: Family Medicine

## 2013-02-05 DIAGNOSIS — E559 Vitamin D deficiency, unspecified: Secondary | ICD-10-CM

## 2013-02-05 DIAGNOSIS — E785 Hyperlipidemia, unspecified: Secondary | ICD-10-CM

## 2013-02-05 DIAGNOSIS — I10 Essential (primary) hypertension: Secondary | ICD-10-CM

## 2013-02-05 LAB — POCT CBC
Granulocyte percent: 67 %G (ref 37–80)
HCT, POC: 42.2 % (ref 37.7–47.9)
Hemoglobin: 13.6 g/dL (ref 12.2–16.2)
LYMPH, POC: 1.8 (ref 0.6–3.4)
MCH: 29.4 pg (ref 27–31.2)
MCHC: 32.2 g/dL (ref 31.8–35.4)
MCV: 91.1 fL (ref 80–97)
MPV: 9.5 fL (ref 0–99.8)
PLATELET COUNT, POC: 239 10*3/uL (ref 142–424)
POC GRANULOCYTE: 4.1 (ref 2–6.9)
POC LYMPH %: 30.1 % (ref 10–50)
RBC: 4.6 M/uL (ref 4.04–5.48)
RDW, POC: 14.4 %
WBC: 6.1 10*3/uL (ref 4.6–10.2)

## 2013-02-05 NOTE — Progress Notes (Signed)
Patient came in for labs only.

## 2013-02-07 LAB — NMR, LIPOPROFILE
Cholesterol: 228 mg/dL — ABNORMAL HIGH (ref <200)
HDL Cholesterol by NMR: 56 mg/dL (ref >=40)
HDL Particle Number: 37.8 umol/L (ref >=30.5)
LDL PARTICLE NUMBER: 2474 nmol/L — AB (ref <1000)
LDL Size: 20.7 nm (ref >20.5)
LDLC SERPL CALC-MCNC: 148 mg/dL — AB (ref <100)
LP-IR Score: 39 (ref <=45)
Small LDL Particle Number: 1214 nmol/L — ABNORMAL HIGH (ref <=527)
Triglycerides by NMR: 121 mg/dL (ref <150)

## 2013-02-07 LAB — HEPATIC FUNCTION PANEL
ALBUMIN: 4.4 g/dL (ref 3.5–4.8)
ALT: 23 IU/L (ref 0–32)
AST: 25 IU/L (ref 0–40)
Alkaline Phosphatase: 67 IU/L (ref 39–117)
Bilirubin, Direct: 0.09 mg/dL (ref 0.00–0.40)
TOTAL PROTEIN: 6.2 g/dL (ref 6.0–8.5)
Total Bilirubin: 0.3 mg/dL (ref 0.0–1.2)

## 2013-02-07 LAB — BMP8+EGFR
BUN/Creatinine Ratio: 37 — ABNORMAL HIGH (ref 11–26)
BUN: 22 mg/dL (ref 8–27)
CALCIUM: 9.5 mg/dL (ref 8.7–10.3)
CO2: 26 mmol/L (ref 18–29)
Chloride: 101 mmol/L (ref 97–108)
Creatinine, Ser: 0.59 mg/dL (ref 0.57–1.00)
GFR calc Af Amer: 107 mL/min/{1.73_m2} (ref >59)
GFR calc non Af Amer: 93 mL/min/{1.73_m2} (ref >59)
Glucose: 103 mg/dL — ABNORMAL HIGH (ref 65–99)
POTASSIUM: 4.2 mmol/L (ref 3.5–5.2)
Sodium: 141 mmol/L (ref 134–144)

## 2013-02-07 LAB — VITAMIN D 25 HYDROXY (VIT D DEFICIENCY, FRACTURES): Vit D, 25-Hydroxy: 74.4 ng/mL (ref 30.0–100.0)

## 2013-03-07 ENCOUNTER — Ambulatory Visit: Payer: Medicare Other | Admitting: Oncology

## 2013-03-15 ENCOUNTER — Telehealth: Payer: Self-pay | Admitting: *Deleted

## 2013-03-15 NOTE — Telephone Encounter (Signed)
Pt called requesting an appt. i gv her the fist avail which was 05/09/13. Pt refused and stated she has been put off twice and have not been into our office in over a year. i offered her to sw the nurse and try to get a sooner appt. Pt refused and stated she will call back later...td

## 2013-03-27 ENCOUNTER — Other Ambulatory Visit: Payer: Self-pay | Admitting: *Deleted

## 2013-03-27 ENCOUNTER — Telehealth: Payer: Self-pay | Admitting: Oncology

## 2013-03-27 NOTE — Telephone Encounter (Signed)
, °

## 2013-04-03 ENCOUNTER — Telehealth: Payer: Self-pay | Admitting: Family Medicine

## 2013-04-03 NOTE — Telephone Encounter (Signed)
Please have patient come in, get EKG, and schedule visit with Dr. Percival Spanish as soon as possible

## 2013-04-03 NOTE — Telephone Encounter (Signed)
Patient needs an appointment with a provider so that the EKG can be interpreted.  Appt sheduled for 4/1 with Aleen Sells, FNP. Dr. Laurance Flatten is also requesting that we place a cardiology referral.  Patient aware.

## 2013-04-04 ENCOUNTER — Encounter: Payer: Self-pay | Admitting: General Practice

## 2013-04-04 ENCOUNTER — Ambulatory Visit (INDEPENDENT_AMBULATORY_CARE_PROVIDER_SITE_OTHER): Payer: Medicare Other | Admitting: General Practice

## 2013-04-04 VITALS — BP 126/77 | HR 88 | Temp 97.9°F | Ht 67.0 in | Wt 172.4 lb

## 2013-04-04 DIAGNOSIS — I499 Cardiac arrhythmia, unspecified: Secondary | ICD-10-CM

## 2013-04-04 DIAGNOSIS — E538 Deficiency of other specified B group vitamins: Secondary | ICD-10-CM

## 2013-04-04 NOTE — Patient Instructions (Signed)
Electrocardiography Electrocardiography is an important test that records the electrical impulses of the heart. It assesses many aspects of heart health, including:  Heart function.  Heart rhythm.  Heart muscle thickness. Electrocardiography can be done as a routine part of a physical exam. It can also be done to evaluate symptoms such as severe chest pain and heart palpitations. PROCEDURE   Electrocardiography is simple, safe, and painless. It takes only a few minutes to perform. No electricity goes through your body during the procedure.  You will be asked to remove your clothes from the waist up and lie on your back for the test.  Sticky patches (electrodes) will be placed on your chest, arms, and legs. The electrodes will be attached by wires to the electrocardiography machine.  You will be asked to relax and lie still for a few seconds while the electrocardiography machine records the electrical activity of your heart. AFTER THE PROCEDURE  If electrocardiography is part of a routine physical exam, you may return to normal activities as told by your caregiver.  Your caregiver or a heart doctor (cardiologist) will interpret the recording.  The test result may not be available during your visit. If your test result is not back during the visit, make an appointment with your caregiver to find out the result. It is important for you to follow up on your test result. Document Released: 12/19/1999 Document Revised: 06/22/2011 Document Reviewed: 05/03/2011 [facility] Patient Information 2014 [place], [place].

## 2013-04-04 NOTE — Progress Notes (Signed)
   Subjective:    Patient ID: [name], female    DOB: Jan 10, 1940, 73 y.o.   MRN: [id number]  HPI Patient presents today for EKG and cardiology referral. She had a sleep study recently and decreased heart rate noted.    Review of Systems  Constitutional: Negative for fever and chills.  Respiratory: Negative for chest tightness and shortness of breath.   Cardiovascular: Negative for chest pain and palpitations.       Objective:   Physical Exam  Constitutional: She is oriented to person, place, and time. She appears well-developed and well-nourished.  Cardiovascular: Normal rate, regular rhythm and normal heart sounds.   Pulmonary/Chest: Effort normal and breath sounds normal. No respiratory distress. She exhibits no tenderness.  Neurological: She is alert and oriented to person, place, and time.  Skin: Skin is warm and dry.  Psychiatric: She has a normal mood and affect.          Assessment & Plan:  1. Irregular heart rate  - EKG 12-Lead - Ambulatory referral to Cardiology  2. B12 deficiency  - Vitamin B12 -RTO prn Patient verbalized understanding Erby Pian, FNP-C

## 2013-04-05 LAB — VITAMIN B12: Vitamin B-12: 282 pg/mL (ref 211–946)

## 2013-04-10 ENCOUNTER — Ambulatory Visit (INDEPENDENT_AMBULATORY_CARE_PROVIDER_SITE_OTHER): Payer: Medicare Other | Admitting: *Deleted

## 2013-04-10 DIAGNOSIS — E538 Deficiency of other specified B group vitamins: Secondary | ICD-10-CM

## 2013-04-10 MED ORDER — CYANOCOBALAMIN 1000 MCG/ML IJ SOLN
1000.0000 ug | INTRAMUSCULAR | Status: DC
Start: 2013-04-10 — End: 2014-06-07
  Administered 2013-04-10 – 2014-01-14 (×8): 1000 ug via INTRAMUSCULAR

## 2013-04-10 MED ORDER — CYANOCOBALAMIN 1000 MCG/ML IJ SOLN: 1000.0000 ug | Freq: Once | INTRAMUSCULAR | Status: DC

## 2013-04-20 ENCOUNTER — Other Ambulatory Visit: Payer: Self-pay | Admitting: *Deleted

## 2013-04-20 DIAGNOSIS — C50919 Malignant neoplasm of unspecified site of unspecified female breast: Secondary | ICD-10-CM

## 2013-04-23 ENCOUNTER — Ambulatory Visit (HOSPITAL_BASED_OUTPATIENT_CLINIC_OR_DEPARTMENT_OTHER): Payer: Medicare Other | Admitting: Oncology

## 2013-04-23 ENCOUNTER — Other Ambulatory Visit (HOSPITAL_BASED_OUTPATIENT_CLINIC_OR_DEPARTMENT_OTHER): Payer: Medicare Other

## 2013-04-23 ENCOUNTER — Other Ambulatory Visit: Payer: Self-pay | Admitting: Family Medicine

## 2013-04-23 VITALS — BP 150/75 | HR 93 | Temp 98.2°F | Resp 18 | Ht 67.0 in | Wt 175.2 lb

## 2013-04-23 DIAGNOSIS — M818 Other osteoporosis without current pathological fracture: Secondary | ICD-10-CM

## 2013-04-23 DIAGNOSIS — Z853 Personal history of malignant neoplasm of breast: Secondary | ICD-10-CM

## 2013-04-23 DIAGNOSIS — C50919 Malignant neoplasm of unspecified site of unspecified female breast: Secondary | ICD-10-CM

## 2013-04-23 DIAGNOSIS — M899 Disorder of bone, unspecified: Secondary | ICD-10-CM

## 2013-04-23 DIAGNOSIS — M949 Disorder of cartilage, unspecified: Secondary | ICD-10-CM

## 2013-04-23 LAB — CBC WITH DIFFERENTIAL/PLATELET
BASO%: 0.6 % (ref 0.0–2.0)
BASOS ABS: 0 10*3/uL (ref 0.0–0.1)
EOS%: 3.5 % (ref 0.0–7.0)
Eosinophils Absolute: 0.3 10*3/uL (ref 0.0–0.5)
HCT: 38.5 % (ref 34.8–46.6)
HEMOGLOBIN: 13 g/dL (ref 11.6–15.9)
LYMPH%: 21.7 % (ref 14.0–49.7)
MCH: 30.9 pg (ref 25.1–34.0)
MCHC: 33.8 g/dL (ref 31.5–36.0)
MCV: 91.5 fL (ref 79.5–101.0)
MONO#: 0.6 10*3/uL (ref 0.1–0.9)
MONO%: 8.1 % (ref 0.0–14.0)
NEUT%: 66.1 % (ref 38.4–76.8)
NEUTROS ABS: 5.1 10*3/uL (ref 1.5–6.5)
PLATELETS: 235 10*3/uL (ref 145–400)
RBC: 4.2 10*6/uL (ref 3.70–5.45)
RDW: 14.1 % (ref 11.2–14.5)
WBC: 7.7 10*3/uL (ref 3.9–10.3)
lymph#: 1.7 10*3/uL (ref 0.9–3.3)

## 2013-04-23 LAB — COMPREHENSIVE METABOLIC PANEL (CC13)
ALBUMIN: 4.1 g/dL (ref 3.5–5.0)
ALT: 32 U/L (ref 0–55)
AST: 31 U/L (ref 5–34)
Alkaline Phosphatase: 74 U/L (ref 40–150)
Anion Gap: 10 mEq/L (ref 3–11)
BUN: 20.1 mg/dL (ref 7.0–26.0)
CALCIUM: 10.3 mg/dL (ref 8.4–10.4)
CHLORIDE: 109 meq/L (ref 98–109)
CO2: 25 meq/L (ref 22–29)
Creatinine: 0.7 mg/dL (ref 0.6–1.1)
Glucose: 97 mg/dl (ref 70–140)
POTASSIUM: 3.5 meq/L (ref 3.5–5.1)
Sodium: 144 mEq/L (ref 136–145)
Total Bilirubin: 0.53 mg/dL (ref 0.20–1.20)
Total Protein: 6.8 g/dL (ref 6.4–8.3)

## 2013-04-23 NOTE — Progress Notes (Signed)
[place]  [phone] [phone]) [phone]     ID: [name] OB: 11-16-1940  MR#: [id number]  DJS#:[id number]  PCP: Leslie Gainer, MD GYN:   SU:  OTHER MD: [name], [name], [name]  CHIEF COMPLAINT: "I had breast cancer"  BREAST CANCER HISTORY: From a Dr. Rene Spence and since 02/04/2003 note, which is the earliest record I can't find on this patient:  "I saw [name] today for followup of her carcinoma of the left breast, T3 N0, stage IIB with high-grade histology, negative lymph nodes and negative hormone receptors.  Diagnosis goes back to June 1996.  I do not believe Her2/neu was ever carried out.  [name] underwent a leftsided modified radical mastectomy followed by four cycles of adjuvant chemotherapy with Adriamycin and Cytoxan from July 1996 through October 1996, followed by radiation treatments from November 1996 through January 1997.  [name] has done extremely well with no evidence for breast cancer recurrence.  [name] seems to have recovered from her left pelvic fracture.  There are no symptoms to suggest recurrent breast cancer.  [name] did undergo CT scans of the abdomen and pelvis on April 10, 2002.  She had a 4 cm right uterine fibroid.  She had a bone scan on April 10, 2002, which showed a healing left inferior pubic ramus fracture.  Additional studies include a chest x-ray from September 07, 2002 showing mild cardiomegaly and possible early chronic obstructive pulmonary disease.  Comparison was made to the chest x-ray of August 30, 2001.  [name] had a mammogram of her right breast on October 02, 2002, which was negative."  The patient's earlier records have been purged, accordingly I am taking her oncologic history from the node just reproduced, confirmed by discussion with the patient.  INTERVAL HISTORY: [name] is establishing herself in my service today. She was formerly followed by Drs [name], [name] and [name], all of whom  have retired.   REVIEW OF SYSTEMS: [name] denies any active breast cancer problems. She has seasonal allergies. Her dentures do not fit well. She has some hoarseness at times. She complains of palpitations, followed by cardiology. She has stress urinary incontinence. She has arthritis, "here and there", but not more intense or persistent than before. She has occasional headaches. She has numbness in her fingers at times. She feels forgetful. She denies depression or anxiety. She has not noted any adenopathy, any change in her breast, and denies fever, rash, or bleeding. A detailed review of systems today was otherwise noncontributory  PAST MEDICAL HISTORY: Past Medical History  Diagnosis Date  . Arthropathy, unspecified, site unspecified   . Anxiety state, unspecified   . Chronic airway obstruction, not elsewhere classified   . Headache(784.0)   . Other and unspecified hyperlipidemia   . Malignant neoplasm of breast (female), unspecified site   . Unspecified hypothyroidism   . Personal history of colonic polyps     adenomatous  . Internal hemorrhoids without mention of complication   . Other dysphagia   . Esophageal reflux   . Pain in joint, multiple sites   . Anal or rectal pain   . Shingles   . Hiatal hernia   . Esophageal stricture     PAST SURGICAL HISTORY: Past Surgical History  Procedure Laterality Date  . Back surgery      X2  . Mastectomy      left  . Breast lumpectomy    . Thyroidectomy    . Tubal ligation  FAMILY HISTORY No family history on file. The patient did not know her father. Her mother died in an automobile accident at the age of 108. The patient had one brother, 3 sisters. There is no history of breast or ovarian cancer in the family to her knowledge.   GYNECOLOGIC HISTORY:  Menarche age 16, first live birth age 64, she is [place] P2. 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 She is divorced the lives by herself, with no pets. [name] is in [place] where he works as a Engineer, maintenance (IT). [name] lives in [place]. The patient has 2 grandchildren.    ADVANCED DIRECTIVES: Not in place   HEALTH MAINTENANCE: History  Substance Use Topics  . Smoking status: Never Smoker   . Smokeless tobacco: Never Used  . Alcohol Use: No     Colonoscopy:Does not recall date   PAP:2015   Bone density:2014   Lipid panel:  Allergies  Allergen Reactions  . Atorvastatin Other (See Comments)    Muscle cramps  . Meperidine Hcl Nausea And Vomiting    Makes me 'deathly' sick  . Statins     REACTION: Patient cannot tolerate due cramps  . Sulfonamide Derivatives Other (See Comments)    Unknown reaction    Current Outpatient Prescriptions  Medication Sig Dispense Refill  . aspirin 81 MG tablet Take 81 mg by mouth daily.        . Calcium Carbonate-Vitamin D (CALCIUM 500/D) 500-125 MG-UNIT TABS Take 1 capsule by mouth daily.        . Cholecalciferol (VITAMIN D3) 2000 UNITS capsule Take 4,000 Units by mouth daily.       Marland Kitchen donepezil (ARICEPT) 10 MG tablet Take 10 mg by mouth daily.      Marland Kitchen ezetimibe (ZETIA) 10 MG tablet Take 1 tablet (10 mg total) by mouth daily.  35 tablet  0  . gabapentin (NEURONTIN) 300 MG capsule Takes 1 tab in am and 2 tabs at night      . ibuprofen (ADVIL,MOTRIN) 200 MG tablet Take 400 mg by mouth every 6 (six) hours as needed.      . lansoprazole (PREVACID) 15 MG capsule Take 15 mg by mouth as needed. For heartburn      . levothyroxine (SYNTHROID, LEVOTHROID) 125 MCG tablet TAKE (1) TABLET BY MOUTH DAILY.  30 tablet  6  . lisinopril-hydrochlorothiazide (PRINZIDE,ZESTORETIC) 10-12.5 MG per tablet TAKE (1) TABLET BY MOUTH DAILY.  30 tablet  3  . Multiple Vitamin (MULTIVITAMIN) capsule Take 1 capsule by mouth daily.        . nortriptyline (PAMELOR) 25 MG capsule Take 3  capsules by mouth at bedtime and may repeat dose one time if needed.      . Omega-3 Fatty Acids (FISH OIL) 1000 MG CAPS Take 1 capsule by mouth daily.        Marland Kitchen senna-docusate (SENOKOT S) 8.6-50 MG per tablet Take 1 tablet by mouth daily as needed for constipation.       Current Facility-Administered Medications  Medication Dose Route Frequency Provider Last Rate Last Dose  . cyanocobalamin ((VITAMIN B-12)) injection 1,000 mcg  1,000 mcg Intramuscular Q30 days Erby Pian, FNP   1,000 mcg at 04/10/13 1524    OBJECTIVE: Older white woman who appears stated age 73 Vitals:   04/23/13 1633  BP: 150/75  Pulse:  93  Temp: 98.2 F (36.8 C)  Resp: 18     Body mass index is 27.43 kg/(m^2).    ECOG FS:1 - Symptomatic but completely ambulatory  Ocular: Sclerae unicteric, pupils round and equal Ear-nose-throat: Oropharynx clea and moist Lymphatic: No cervical or supraclavicular adenopathy Lungs no rales or rhonchi, good excursion bilaterally Heart regular rate and rhythm, no murmur appreciated Abd soft, nontender, positive bowel sounds MSK no focal spinal tenderness, no joint edema Neuro: non-focal, well-oriented, appropriate affect Breasts: The right breast is unremarkable. There are no skin or nipple changes of concern. The right axilla is benign. The left breast is surgically absent. The left chest wall shows no evidence of local recurrence. The left axilla is benign.   LAB RESULTS:  CMP     Component Value Date/Time   NA 144 04/23/2013 1549   NA 141 02/05/2013 0902   NA 139 07/10/2012 1056   K 3.5 04/23/2013 1549   K 4.2 02/05/2013 0902   CL 101 02/05/2013 0902   CO2 25 04/23/2013 1549   CO2 26 02/05/2013 0902   GLUCOSE 97 04/23/2013 1549   GLUCOSE 103* 02/05/2013 0902   GLUCOSE 107* 07/10/2012 1056   BUN 20.1 04/23/2013 1549   BUN 22 02/05/2013 0902   BUN 13 07/10/2012 1056   CREATININE 0.7 04/23/2013 1549   CREATININE 0.59 02/05/2013 0902   CREATININE 0.50 07/10/2012 1056   CALCIUM 10.3  04/23/2013 1549   CALCIUM 9.5 02/05/2013 0902   PROT 6.8 04/23/2013 1549   PROT 6.2 02/05/2013 0902   PROT 6.9 07/10/2012 1056   ALBUMIN 4.1 04/23/2013 1549   ALBUMIN 4.6 07/10/2012 1056   AST 31 04/23/2013 1549   AST 25 02/05/2013 0902   ALT 32 04/23/2013 1549   ALT 23 02/05/2013 0902   ALKPHOS 74 04/23/2013 1549   ALKPHOS 67 02/05/2013 0902   BILITOT 0.53 04/23/2013 1549   BILITOT 0.3 02/05/2013 0902   GFRNONAA 93 02/05/2013 0902   GFRNONAA >89 07/10/2012 1056   GFRAA 107 02/05/2013 0902   GFRAA >89 07/10/2012 1056    I No results found for this basename: SPEP,  UPEP,   kappa and lambda light chains    Lab Results  Component Value Date   WBC 7.7 04/23/2013   NEUTROABS 5.1 04/23/2013   HGB 13.0 04/23/2013   HCT 38.5 04/23/2013   MCV 91.5 04/23/2013   PLT 235 04/23/2013      Chemistry      Component Value Date/Time   NA 144 04/23/2013 1549   NA 141 02/05/2013 0902   NA 139 07/10/2012 1056   K 3.5 04/23/2013 1549   K 4.2 02/05/2013 0902   CL 101 02/05/2013 0902   CO2 25 04/23/2013 1549   CO2 26 02/05/2013 0902   BUN 20.1 04/23/2013 1549   BUN 22 02/05/2013 0902   BUN 13 07/10/2012 1056   CREATININE 0.7 04/23/2013 1549   CREATININE 0.59 02/05/2013 0902   CREATININE 0.50 07/10/2012 1056      Component Value Date/Time   CALCIUM 10.3 04/23/2013 1549   CALCIUM 9.5 02/05/2013 0902   ALKPHOS 74 04/23/2013 1549   ALKPHOS 67 02/05/2013 0902   AST 31 04/23/2013 1549   AST 25 02/05/2013 0902   ALT 32 04/23/2013 1549   ALT 23 02/05/2013 0902   BILITOT 0.53 04/23/2013 1549   BILITOT 0.3 02/05/2013 0902       No results found for this basename: LABCA2    No components found with  this basename: EPNTB505    No results found for this basename: INR,  in the last 168 hours  Urinalysis    Component Value Date/Time   COLORURINE YELLOW 03/27/2011 1210   APPEARANCEUR CLEAR 03/27/2011 1210   LABSPEC <1.005* 03/27/2011 1210   PHURINE 6.5 03/27/2011 1210   GLUCOSEU NEGATIVE 03/27/2011 1210   HGBUR NEGATIVE 03/27/2011 1210   BILIRUBINUR  neg 10/24/2012 1630   BILIRUBINUR NEGATIVE 03/27/2011 1210   KETONESUR NEGATIVE 03/27/2011 1210   PROTEINUR neg 10/24/2012 1630   PROTEINUR NEGATIVE 03/27/2011 1210   UROBILINOGEN negative 10/24/2012 1630   UROBILINOGEN 0.2 03/27/2011 1210   NITRITE neg 10/24/2012 1630   NITRITE NEGATIVE 03/27/2011 1210   LEUKOCYTESUR moderate (2+) 10/24/2012 1630    STUDIES: Bone density 10/25/2012 shows mild osteopenia with a T score of -1.2 as the lowest reading. Next mammogram is due may 2015  ASSESSMENT: 73 y.o. [place], [place] woman status post left modified radical mastectomy 1996 for a pT3 pN0, stage IIB invasive ductal carcinoma, grade 3, estrogen and progesterone receptor negative  (1) status post doxorubicin and cyclophosphamide x4 completed in October of 1996  (2) status post adjuvant radiation completed January of 1997  PLAN: Amma is nearly 20 years out from her surgery for node-negative breast cancer with no evidence of recurrence. I reassured her that estrogen receptor negative cancers, if you're going to recur, tend to recur early. Recurrence of these tumors after 7 years is unusual.  Accordingly I am comfortable releasing her to her primary care physician's care. She understands that I have gone ahead and done at full intake today, and then we will keep the current data in our records for an additional 10 years. During that time if there are any questions or concerns she can call or if she wishes schedule a visit. I am glad to be her oncologist, I just don't think she needs to see an oncologist routinely at this point  She does need a yearly physician breast exam and yearly mammography. She assures me she will obtain these through her primary care physician.   I will be glad to see [place] at any point in the future as needed, but as of now no further routine appointments are being made for her here.    Chauncey Cruel, MD   04/28/2013 3:03 PM

## 2013-04-30 ENCOUNTER — Telehealth: Payer: Self-pay | Admitting: Oncology

## 2013-04-30 NOTE — Telephone Encounter (Signed)
Sent letter to [place] office from Dr. Jana Hakim

## 2013-05-15 ENCOUNTER — Ambulatory Visit (INDEPENDENT_AMBULATORY_CARE_PROVIDER_SITE_OTHER): Payer: Medicare Other | Admitting: Family Medicine

## 2013-05-15 ENCOUNTER — Encounter: Payer: Self-pay | Admitting: Family Medicine

## 2013-05-15 VITALS — BP 128/66 | HR 94 | Temp 98.6°F | Ht 67.0 in | Wt 173.0 lb

## 2013-05-15 DIAGNOSIS — G4730 Sleep apnea, unspecified: Secondary | ICD-10-CM

## 2013-05-15 DIAGNOSIS — M949 Disorder of cartilage, unspecified: Secondary | ICD-10-CM

## 2013-05-15 DIAGNOSIS — E538 Deficiency of other specified B group vitamins: Secondary | ICD-10-CM

## 2013-05-15 DIAGNOSIS — I10 Essential (primary) hypertension: Secondary | ICD-10-CM | POA: Insufficient documentation

## 2013-05-15 DIAGNOSIS — Z853 Personal history of malignant neoplasm of breast: Secondary | ICD-10-CM

## 2013-05-15 DIAGNOSIS — M899 Disorder of bone, unspecified: Secondary | ICD-10-CM

## 2013-05-15 DIAGNOSIS — E785 Hyperlipidemia, unspecified: Secondary | ICD-10-CM

## 2013-05-15 DIAGNOSIS — G500 Trigeminal neuralgia: Secondary | ICD-10-CM

## 2013-05-15 DIAGNOSIS — E559 Vitamin D deficiency, unspecified: Secondary | ICD-10-CM

## 2013-05-15 DIAGNOSIS — R002 Palpitations: Secondary | ICD-10-CM

## 2013-05-15 DIAGNOSIS — F411 Generalized anxiety disorder: Secondary | ICD-10-CM

## 2013-05-15 DIAGNOSIS — E890 Postprocedural hypothyroidism: Secondary | ICD-10-CM | POA: Insufficient documentation

## 2013-05-15 DIAGNOSIS — M8580 Other specified disorders of bone density and structure, unspecified site: Secondary | ICD-10-CM

## 2013-05-15 DIAGNOSIS — E039 Hypothyroidism, unspecified: Secondary | ICD-10-CM

## 2013-05-15 NOTE — Patient Instructions (Addendum)
Medicare Annual Wellness Visit  [facility] and the medical providers at [facility] strive to bring you the best medical care.  In doing so we not only want to address your current medical conditions and concerns but also to detect new conditions early and prevent illness, disease and health-related problems.    Medicare offers a yearly Wellness Visit which allows our clinical staff to assess your need for preventative services including immunizations, lifestyle education, counseling to decrease risk of preventable diseases and screening for fall risk and other medical concerns.    This visit is provided free of charge (no copay) for all Medicare recipients. The clinical pharmacists at [facility] have begun to conduct these Wellness Visits which will also include a thorough review of all your medications.    As you primary medical provider recommend that you make an appointment for your Annual Wellness Visit if you have not done so already this year.  You may set up this appointment before you leave today or you may call back ([phone]) and schedule an appointment.  Please make sure when you call that you mention that you are scheduling your Annual Wellness Visit with the clinical pharmacist so that the appointment may be made for the proper length of time.      Continue current medications. Continue good therapeutic lifestyle changes which include good diet and exercise. Fall precautions discussed with patient. If an FOBT was given today- please return it to our front desk. If you are over 56 years old - you may need Prevnar 68 or the adult Pneumonia vaccine. The Prevnar vaccine may make your arm sore Please reduce your caffeine intake

## 2013-05-15 NOTE — Progress Notes (Signed)
Subjective:    Patient ID: [name], female    DOB: [birth year], 73 y.o.   MRN: [id number]  HPI Patient comes in today for 3 month follow up on chronic medical conditions. The patient is still having problems adjusting to her CPAP and mask. She has some chronic joint problems that have been going on for a good while. Another thing that she has noticed is that she bruises easily. She is also due to receive a Prevnar and DTaP. We will give her the Prevnar today and have her return for the Dtap. It is important to the breast surgeon who did her breast surgery for breast cancer says he does not need to see her back anymore.    Review of Systems  Constitutional: Negative.   HENT: Negative.   Eyes: Negative.   Respiratory: Negative.   Cardiovascular: Negative for chest pain and palpitations. Leg swelling: No more than normal.  Gastrointestinal: Negative.   Endocrine: Negative.   Genitourinary: Negative.   Musculoskeletal: Positive for arthralgias (chronic).  Skin: Negative.   Allergic/Immunologic: Negative.   Neurological: Negative.   Hematological: Bruises/bleeds easily.  Psychiatric/Behavioral: Positive for sleep disturbance (adjusting to CPAP). The patient is nervous/anxious.    the patient indicates that her memory deficit is unchanged. shee feels that it is good at this time.     Objective:   Physical Exam  Nursing note and vitals reviewed. Constitutional: She is oriented to person, place, and time. She appears well-developed and well-nourished. No distress.  Pleasant and cooperative  HENT:  Head: Normocephalic and atraumatic.  Right Ear: External ear normal.  Left Ear: External ear normal.  Nose: Nose normal.  Mouth/Throat: Oropharynx is clear and moist.  Eyes: Conjunctivae and EOM are normal. Pupils are equal, round, and reactive to light. Right eye exhibits no discharge. Left eye exhibits no discharge. No scleral icterus.  Neck: Normal range of motion. Neck supple. No  thyromegaly present.  Cardiovascular: Normal rate, regular rhythm, normal heart sounds and intact distal pulses.   No murmur heard. At 96 per minute  Pulmonary/Chest: Effort normal and breath sounds normal. No respiratory distress. She has no wheezes. She has no rales. She exhibits no tenderness.  Abdominal: Soft. Bowel sounds are normal. She exhibits no mass. There is no tenderness. There is no rebound and no guarding.  Musculoskeletal: Normal range of motion. She exhibits no edema and no tenderness.  Lymphadenopathy:    She has no cervical adenopathy.  Neurological: She is alert and oriented to person, place, and time. She has normal reflexes. No cranial nerve deficit.  Skin: Skin is warm and dry. No rash noted.  Psychiatric: She has a normal mood and affect. Her behavior is normal. Judgment and thought content normal.   BP 128/66  Pulse 94  Temp(Src) 98.6 F (37 C) (Oral)  Ht 5\' 7"  (1.702 m)  Wt 173 lb (78.472 kg)  BMI 27.09 kg/m2        Assessment & Plan:  1. Hyperlipidemia - Lipid panel  2. Vitamin D deficiency - Vit D  25 hydroxy (rtn osteoporosis monitoring)  3. HTN (hypertension) -Continue current medication  4. Osteopenia  5. Need for pneumococcal vaccination - Pneumococcal conjugate vaccine 13-valent  6. Hypothyroid  7. Sleep apnea  8. ANXIETY  9. Palpitations -Keep appointment with a cardiologist  10. History of breast cancer  11. Trigeminal neuralgia -Continue to follow up with the neurologist  Patient Instructions  Medicare Annual Wellness Visit  [facility] and the medical providers at [facility] strive to bring you the best medical care.  In doing so we not only want to address your current medical conditions and concerns but also to detect new conditions early and prevent illness, disease and health-related problems.    Medicare offers a yearly Wellness Visit which allows our clinical staff to  assess your need for preventative services including immunizations, lifestyle education, counseling to decrease risk of preventable diseases and screening for fall risk and other medical concerns.    This visit is provided free of charge (no copay) for all Medicare recipients. The clinical pharmacists at [facility] have begun to conduct these Wellness Visits which will also include a thorough review of all your medications.    As you primary medical provider recommend that you make an appointment for your Annual Wellness Visit if you have not done so already this year.  You may set up this appointment before you leave today or you may call back ([phone]) and schedule an appointment.  Please make sure when you call that you mention that you are scheduling your Annual Wellness Visit with the clinical pharmacist so that the appointment may be made for the proper length of time.      Continue current medications. Continue good therapeutic lifestyle changes which include good diet and exercise. Fall precautions discussed with patient. If an FOBT was given today- please return it to our front desk. If you are over 67 years old - you may need Prevnar 21 or the adult Pneumonia vaccine. The Prevnar vaccine may make your arm sore Please reduce your caffeine intake  Pneumococcal Vaccine, Polyvalent suspension for injection What is this medicine? PNEUMOCOCCAL VACCINE, POLYVALENT (NEU mo KOK al vak SEEN, pol ee VEY luhnt) is a vaccine to prevent pneumococcus bacteria infection. These bacteria are a major cause of ear infections, 'Strep throat' infections, and serious pneumonia, meningitis, or blood infections worldwide. These vaccines help the body to produce antibodies (protective substances) that help your body defend against these bacteria. This vaccine is recommended for infants and young children. This vaccine will not treat an infection. This medicine may be used for other  purposes; ask your health care provider or pharmacist if you have questions. COMMON BRAND NAME(S): Prevnar 13 , Prevnar What should I tell my health care provider before I take this medicine? They need to know if you have any of these conditions: -bleeding problems -fever -immune system problems -low platelet count in the blood -seizures -an unusual or allergic reaction to pneumococcal vaccine, diphtheria toxoid, other vaccines, latex, other medicines, foods, dyes, or preservatives -pregnant or trying to get pregnant -breast-feeding How should I use this medicine? This vaccine is for injection into a muscle. It is given by a health care professional. A copy of Vaccine Information Statements will be given before each vaccination. Read this sheet carefully each time. The sheet may change frequently. Talk to your pediatrician regarding the use of this medicine in children. While this drug may be prescribed for children as young as 72 weeks old for selected conditions, precautions do apply. Overdosage: If you think you have taken too much of this medicine contact a poison control center or emergency room at once. NOTE: This medicine is only for you. Do not share this medicine with others. What if I miss a dose? It is important not to miss your dose. Call your doctor or health care professional  if you are unable to keep an appointment. What may interact with this medicine? -medicines for cancer chemotherapy -medicines that suppress your immune function -medicines that treat or prevent blood clots like warfarin, enoxaparin, and dalteparin -steroid medicines like prednisone or cortisone This list may not describe all possible interactions. Give your health care provider a list of all the medicines, herbs, non-prescription drugs, or dietary supplements you use. Also tell them if you smoke, drink alcohol, or use illegal drugs. Some items may interact with your medicine. What should I watch for while  using this medicine? Mild fever and pain should go away in 3 days or less. Report any unusual symptoms to your doctor or health care professional. What side effects may I notice from receiving this medicine? Side effects that you should report to your doctor or health care professional as soon as possible: -allergic reactions like skin rash, itching or hives, swelling of the face, lips, or tongue -breathing problems -confused -fever over 102 degrees F -pain, tingling, numbness in the hands or feet -seizures -unusual bleeding or bruising -unusual muscle weakness Side effects that usually do not require medical attention (report to your doctor or health care professional if they continue or are bothersome): -aches and pains -diarrhea -fever of 102 degrees F or less -headache -irritable -loss of appetite -pain, tender at site where injected -trouble sleeping This list may not describe all possible side effects. Call your doctor for medical advice about side effects. You may report side effects to FDA at 1-800-FDA-1088. Where should I keep my medicine? This does not apply. This vaccine is given in a clinic, pharmacy, doctor's office, or other health care setting and will not be stored at home. NOTE: This sheet is a summary. It may not cover all possible information. If you have questions about this medicine, talk to your doctor, pharmacist, or health care provider.  2014, Elsevier/Gold Standard. (2008-03-05 10:17:22)    Arrie Senate MD

## 2013-05-17 LAB — LIPID PANEL
CHOL/HDL RATIO: 4.4 ratio (ref 0.0–4.4)
Cholesterol, Total: 206 mg/dL — ABNORMAL HIGH (ref 100–199)
HDL: 47 mg/dL (ref >39)
LDL Calculated: 126 mg/dL — ABNORMAL HIGH (ref 0–99)
Triglycerides: 163 mg/dL — ABNORMAL HIGH (ref 0–149)
VLDL Cholesterol Cal: 33 mg/dL (ref 5–40)

## 2013-05-17 LAB — VITAMIN D 25 HYDROXY (VIT D DEFICIENCY, FRACTURES): Vit D, 25-Hydroxy: 67.3 ng/mL (ref 30.0–100.0)

## 2013-05-18 ENCOUNTER — Telehealth: Payer: Self-pay | Admitting: Family Medicine

## 2013-05-18 NOTE — Telephone Encounter (Signed)
Message copied by Cline Crock on Fri May 18, 2013  2:46 PM ------      Message from: Chipper Herb      Created: Thu May 17, 2013  7:19 AM       With traditional testing and not fasting the LDL C. cholesterol remains elevated at 126. It should be less than 100. The triglycerides are also elevated at 163 but this would be expected non-fasting----the patient is intolerant to statins. Please encourage her to be more physically active and watch her diet more closely. She should continue the zetia      The vitamin D level is good, continue current treatment ------

## 2013-05-30 ENCOUNTER — Ambulatory Visit (INDEPENDENT_AMBULATORY_CARE_PROVIDER_SITE_OTHER): Payer: Medicare Other | Admitting: *Deleted

## 2013-05-30 ENCOUNTER — Ambulatory Visit (INDEPENDENT_AMBULATORY_CARE_PROVIDER_SITE_OTHER): Payer: Medicare Other | Admitting: Cardiology

## 2013-05-30 ENCOUNTER — Encounter: Payer: Self-pay | Admitting: Cardiology

## 2013-05-30 VITALS — BP 131/74 | HR 90 | Ht 70.0 in | Wt 177.0 lb

## 2013-05-30 DIAGNOSIS — I498 Other specified cardiac arrhythmias: Secondary | ICD-10-CM

## 2013-05-30 DIAGNOSIS — I10 Essential (primary) hypertension: Secondary | ICD-10-CM

## 2013-05-30 DIAGNOSIS — R001 Bradycardia, unspecified: Secondary | ICD-10-CM

## 2013-05-30 MED ORDER — EZETIMIBE 10 MG PO TABS: 10.0000 mg | ORAL_TABLET | Freq: Every day | ORAL | Status: DC

## 2013-05-30 NOTE — Progress Notes (Signed)
HPI The patient presents as a new patient.  She has no prior cardiac history.  She was apparently noted to have bradycardia during a sleep study.  I do not have these results.  However, she does not really have any symptoms related to this.  She does her chores of daily living.  With this she denies any cardiovascular symptoms.  The patient denies any new symptoms such as chest discomfort, neck or arm discomfort. There has been no new shortness of breath, PND or orthopnea. There have been no reported palpitations, presyncope or syncope.    Allergies  Allergen Reactions  . Atorvastatin Other (See Comments)    Muscle cramps  . Meperidine Hcl Nausea And Vomiting    Makes me 'deathly' sick  . Statins     REACTION: Patient cannot tolerate due cramps  . Sulfonamide Derivatives Other (See Comments)    Unknown reaction    Current Outpatient Prescriptions  Medication Sig Dispense Refill  . aspirin 81 MG tablet Take 81 mg by mouth daily.        . Calcium Carbonate-Vitamin D (CALCIUM 500/D) 500-125 MG-UNIT TABS Take 1 capsule by mouth daily.        . Cholecalciferol (VITAMIN D3) 2000 UNITS capsule Take 4,000 Units by mouth daily.       Marland Kitchen donepezil (ARICEPT) 10 MG tablet Take 10 mg by mouth daily.      Marland Kitchen ezetimibe (ZETIA) 10 MG tablet Take 1 tablet (10 mg total) by mouth daily.  35 tablet  0  . gabapentin (NEURONTIN) 300 MG capsule Takes 1 tab in am and 2 tabs at night      . ibuprofen (ADVIL,MOTRIN) 200 MG tablet Take 400 mg by mouth every 6 (six) hours as needed.      . lansoprazole (PREVACID) 15 MG capsule Take 15 mg by mouth as needed. For heartburn      . levothyroxine (SYNTHROID, LEVOTHROID) 125 MCG tablet TAKE (1) TABLET BY MOUTH DAILY.  30 tablet  6  . lisinopril-hydrochlorothiazide (PRINZIDE,ZESTORETIC) 10-12.5 MG per tablet TAKE (1) TABLET BY MOUTH DAILY.  30 tablet  3  . Multiple Vitamin (MULTIVITAMIN) capsule Take 1 capsule by mouth daily.        . nortriptyline (PAMELOR) 25 MG  capsule Take 3 capsules by mouth at bedtime and may repeat dose one time if needed.      . Omega-3 Fatty Acids (FISH OIL) 1000 MG CAPS Take 1 capsule by mouth daily.        Marland Kitchen senna-docusate (SENOKOT S) 8.6-50 MG per tablet Take 1 tablet by mouth daily as needed for constipation.       Current Facility-Administered Medications  Medication Dose Route Frequency Provider Last Rate Last Dose  . cyanocobalamin ((VITAMIN B-12)) injection 1,000 mcg  1,000 mcg Intramuscular Q30 days Erby Pian, FNP   1,000 mcg at 05/15/13 1642    Past Medical History  Diagnosis Date  . Arthropathy, unspecified, site unspecified   . Anxiety state, unspecified   . Chronic airway obstruction, not elsewhere classified   . Headache(784.0)   . Other and unspecified hyperlipidemia   . Malignant neoplasm of breast (female), unspecified site   . Unspecified hypothyroidism   . Personal history of colonic polyps     adenomatous  . Internal hemorrhoids without mention of complication   . Other dysphagia   . Esophageal reflux   . Pain in joint, multiple sites   . Anal or rectal pain   . Shingles   .  Hiatal hernia   . Esophageal stricture     Past Surgical History  Procedure Laterality Date  . Back surgery      X2  . Mastectomy      left  . Breast lumpectomy    . Thyroidectomy    . Tubal ligation      FH:  She did not know her father.  Her mother died in her 51s in an MVA.  She has two half siblings who she does not think have medical problems.   History   Social History  . Marital Status: Divorced    Spouse Name: N/A    Number of Children: 2  . Years of Education: N/A   Occupational History  . retired    Social History Main Topics  . Smoking status: Never Smoker   . Smokeless tobacco: Never Used  . Alcohol Use: No  . Drug Use: No  . Sexual Activity: Not on file   Other Topics Concern  . Not on file   Social History Narrative  . No narrative on file    ROS:  Positive for vertigo,  headache, leg cramps, pain in her hands.  Otherwise as stated in the HPI and negative for all other systems.   PHYSICAL EXAM BP 131/74  Pulse 90  Ht 5\' 10"  (1.778 m)  Wt 177 lb (80.287 kg)  BMI 25.40 kg/m2 GENERAL:  Well appearing HEENT:  Pupils equal round and reactive, fundi not visualized, oral mucosa unremarkable NECK:  No jugular venous distention, waveform within normal limits, carotid upstroke brisk and symmetric, no bruits, no thyromegaly LYMPHATICS:  No cervical, inguinal adenopathy LUNGS:  Clear to auscultation bilaterally BACK:  No CVA tenderness CHEST:  Unremarkable, left breast surgey. HEART:  PMI not displaced or sustained,S1 and S2 within normal limits, no S3, no S4, no clicks, no rubs, no murmurs ABD:  Flat, positive bowel sounds normal in frequency in pitch, no bruits, no rebound, no guarding, no midline pulsatile mass, no hepatomegaly, no splenomegaly EXT:  2 plus pulses throughout, no edema, no cyanosis no clubbing SKIN:  No rashes no nodules NEURO:  Cranial nerves II through XII grossly intact, motor grossly intact throughout PSYCH:  Cognitively intact, oriented to person place and time  EKG:  NSR, rate 92, axis WNL, intervals WNL, no acute ST T wave changes.  Her QT was slightly prolonged.  04/04/13  ASSESSMENT AND PLAN  BRADYCARDIA:  I will check a Holter.  I suspect that this is related to her sleep apnea however.  I doubt that further cardiovascular testing will be indicated.    HTN:  The blood pressure is at target. No change in medications is indicated. We will continue with therapeutic lifestyle changes (TLC).

## 2013-05-30 NOTE — Progress Notes (Signed)
holter monitor placed and patient aware to return to pur office tomorrow after 4:00pm

## 2013-05-30 NOTE — Patient Instructions (Addendum)
The current medical regimen is effective;  continue present plan and medications.  Your physician has recommended that you wear a holter monitor. Holter monitors are medical devices that record the heart's electrical activity. Doctors most often use these monitors to diagnose arrhythmias. Arrhythmias are problems with the speed or rhythm of the heartbeat. The monitor is a small, portable device. You can wear one while you do your normal daily activities. This is usually used to diagnose what is causing palpitations/syncope (passing out).  Follow up as needed.

## 2013-06-04 ENCOUNTER — Ambulatory Visit (HOSPITAL_COMMUNITY)
Admission: RE | Admit: 2013-06-04 | Discharge: 2013-06-04 | Disposition: A | Discharge status: Home / Self Care | Payer: Medicare Other | Source: Ambulatory Visit | Attending: Hematology and Oncology | Admitting: Hematology and Oncology

## 2013-06-04 DIAGNOSIS — Z1231 Encounter for screening mammogram for malignant neoplasm of breast: Secondary | ICD-10-CM | POA: Insufficient documentation

## 2013-06-04 DIAGNOSIS — C50919 Malignant neoplasm of unspecified site of unspecified female breast: Secondary | ICD-10-CM

## 2013-06-05 ENCOUNTER — Telehealth: Payer: Self-pay | Admitting: *Deleted

## 2013-06-05 NOTE — Telephone Encounter (Signed)
LM on patients answering machine that we have her holter monitor results and we will be sending them to Dr. Warren Lacy and that his office should be calling her with results.

## 2013-06-26 ENCOUNTER — Ambulatory Visit (INDEPENDENT_AMBULATORY_CARE_PROVIDER_SITE_OTHER): Payer: Medicare Other | Admitting: *Deleted

## 2013-06-26 DIAGNOSIS — E538 Deficiency of other specified B group vitamins: Secondary | ICD-10-CM

## 2013-06-26 MED ORDER — EZETIMIBE 10 MG PO TABS: 10.0000 mg | ORAL_TABLET | Freq: Every day | ORAL | Status: DC

## 2013-06-26 NOTE — Progress Notes (Signed)
Vitamin b12 given and tolerated well. 

## 2013-06-26 NOTE — Patient Instructions (Signed)
Vitamin B12 Injections Every person needs vitamin B12. A deficiency develops when the body does not get enough of it. One way to overcome this is by getting B12 shots (injections). A B12 shot puts the vitamin directly into muscle tissue. This avoids any problems your body might have in absorbing it from food or a pill. In some people, the body has trouble using the vitamin correctly. This can cause a B12 deficiency. Not consuming enough of the vitamin can also cause a deficiency. Getting enough vitamin B12 can be hard for elderly people. Sometimes, they do not eat a well-balanced diet. The elderly are also more likely than younger people to have medical conditions or take medications that can lead to a deficiency. WHAT DOES VITAMIN B12 DO? Vitamin B12 does many things to help the body work right:  It helps the body make healthy red blood cells.  It helps maintain nerve cells.  It is involved in the body's process of converting food into energy (metabolism).  It is needed to make the genetic material in all cells (DNA). VITAMIN B12 FOOD SOURCES Most people get plenty of vitamin B12 through the foods they eat. It is present in:  Meat, fish, poultry, and eggs.  Milk and milk products.  It also is added when certain foods are made, including some breads, cereals and yogurts. The food is then called "fortified". CAUSES The most common causes of vitamin B12 deficiency are:  Pernicious anemia. The condition develops when the body cannot make enough healthy red blood cells. This stems from a lack of a protein made in the stomach (intrinsic factor). People without this protein cannot absorb enough vitamin B12 from food.  Malabsorption. This is when the body cannot absorb the vitamin. It can be caused by:  Pernicious anemia.  Surgery to remove part or all of the stomach can lead to malabsorption. Removal of part or all of the small intestine can also cause malabsorption.  Vegetarian diet.  People who are strict about not eating foods from animals could have trouble taking in enough vitamin B12 from diet alone.  Medications. Some medicines have been linked to B12 deficiency, such as Metformin (a drug prescribed for type 2 diabetes). Long-term use of stomach acid suppressants also can keep the vitamin from being absorbed.  Intestinal problems such as inflammatory bowel disease. If there are problems in the digestive tract, vitamin B12 may not be absorbed in good enough amounts. SYMPTOMS People who do not get enough B12 can develop problems. These can include:  Anemia. This is when the body has too few red blood cells. Red blood cells carry oxygen to the rest of the body. Without a healthy supply of red blood cells, people can feel:  Tired (fatigued).  Weak.  Severe anemia can cause:  Shortness of breath.  Dizziness.  Rapid heart rate.  Paleness.  Other Vitamin B12 deficiency symptoms include:  Diarrhea.  Numbness or tingling in the hands or feet.  Loss of appetite.  Confusion.  Sores on the tongue or in the mouth. LET YOUR CAREGIVER KNOW ABOUT:  Any allergies. It is very important to know if you are allergic or sensitive to cobalt. Vitamin B12 contains cobalt.  Any history of kidney disease.  All medications you are taking. Include prescription and over-the-counter medicines, herbs and creams.  Whether you are pregnant or breast-feeding.  If you have Leber's disease, a hereditary eye condition, vitamin B12 could make it worse. RISKS AND COMPLICATIONS Reactions to an injection are   usually temporary. They might include:  Pain at the injection site.  Redness, swelling or tenderness at the site.  Headache, dizziness or weakness.  Nausea, upset stomach or diarrhea.  Numbness or tingling.  Fever.  Joint pain.  Itching or rash. If a reaction does not go away in a short while, talk with your healthcare provider. A change in the way the shots are  given, or where they are given, might need to be made. BEFORE AN INJECTION To decide whether B12 injections are right for you, your healthcare provider will probably:  Ask about your medical history.  Ask questions about your diet.  Ask about symptoms such as:  Have you felt weak?  Do you feel unusually tired?  Do you get dizzy?  Order blood tests. These may include a test to:  Check the level of red cells in your blood.  Measure B12 levels.  Check for the presence of intrinsic factor. VITAMIN B12 INJECTIONS How often you will need a vitamin B12 injection will depend on how severe your deficiency is. This also will affect how long you will need to get them. People with pernicious anemia usually get injections for their entire life. Others might get them for a shorter period. For many people, injections are given daily or weekly for several weeks. Then, once B12 levels are normal, injections are given just once a month. If the cause of the deficiency can be fixed, the injections can be stopped. Talk with your healthcare provider about what you should expect. For an injection:  The injection site will be cleaned with an alcohol swab.  Your healthcare provider will insert a needle directly into a muscle. Most any muscle can be used. Most often, an arm muscle is used. A buttocks muscle can also be used. Many people say shots in that area are less painful.  A small adhesive bandage may be put over the injection site. It usually can be taken off in an hour or less. Injections can be given by your healthcare provider. In some cases, family members give them. Sometimes, people give them to themselves. Talk with your healthcare provider about what would be best for you. If someone other than your healthcare provider will be giving the shots, the person will need to be trained to give them correctly. HOME CARE INSTRUCTIONS   You can remove the adhesive bandage within an hour of getting a  shot.  You should be able to go about your normal activities right away.  Avoid drinking large amounts of alcohol while taking vitamin B12 shots. Alcohol can interfere with the body's use of the vitamin. SEEK MEDICAL CARE IF:   Pain, redness, swelling or tenderness at the injection site does not get better or gets worse.  Headache, dizziness or weakness does not go away.  You develop a fever of more than 100.5 F (38.1 C). SEEK IMMEDIATE MEDICAL CARE IF:   You have chest pain.  You develop shortness of breath.  You have muscle weakness that gets worse.  You develop numbness, weakness or tingling on one side or one area of the body.  You have symptoms of an allergic reaction, such as:  Hives.  Difficulty breathing.  Swelling of the lips, face, tongue or throat.  You develop a fever of more than 102.0 F (38.9 C). MAKE SURE YOU:   Understand these instructions.  Will watch your condition.  Will get help right away if you are not doing well or get worse. Document   Released: 03/19/2008 Document Revised: 03/15/2011 Document Reviewed: 03/19/2008 Exit[employer] Patient Information 2015 [employer]. This information is not intended to replace advice given to you by your health care provider. Make sure you discuss any questions you have with your health care provider.  

## 2013-07-09 ENCOUNTER — Ambulatory Visit (INDEPENDENT_AMBULATORY_CARE_PROVIDER_SITE_OTHER): Payer: Medicare Other | Admitting: Family Medicine

## 2013-07-09 ENCOUNTER — Encounter: Payer: Self-pay | Admitting: Family Medicine

## 2013-07-09 ENCOUNTER — Telehealth: Payer: Self-pay | Admitting: Family Medicine

## 2013-07-09 ENCOUNTER — Ambulatory Visit (INDEPENDENT_AMBULATORY_CARE_PROVIDER_SITE_OTHER): Payer: Medicare Other

## 2013-07-09 VITALS — BP 105/69 | HR 94 | Temp 97.6°F | Ht 70.0 in | Wt 172.4 lb

## 2013-07-09 DIAGNOSIS — W57XXXA Bitten or stung by nonvenomous insect and other nonvenomous arthropods, initial encounter: Secondary | ICD-10-CM

## 2013-07-09 DIAGNOSIS — S30861A Insect bite (nonvenomous) of abdominal wall, initial encounter: Secondary | ICD-10-CM

## 2013-07-09 DIAGNOSIS — E039 Hypothyroidism, unspecified: Secondary | ICD-10-CM

## 2013-07-09 DIAGNOSIS — J209 Acute bronchitis, unspecified: Secondary | ICD-10-CM

## 2013-07-09 DIAGNOSIS — S30860A Insect bite (nonvenomous) of lower back and pelvis, initial encounter: Secondary | ICD-10-CM

## 2013-07-09 DIAGNOSIS — R5381 Other malaise: Secondary | ICD-10-CM

## 2013-07-09 DIAGNOSIS — R5383 Other fatigue: Secondary | ICD-10-CM

## 2013-07-09 LAB — POCT CBC
Granulocyte percent: 62 %G (ref 37–80)
HCT, POC: 41.5 % (ref 37.7–47.9)
Hemoglobin: 13.3 g/dL (ref 12.2–16.2)
LYMPH, POC: 1.8 (ref 0.6–3.4)
MCH: 29.3 pg (ref 27–31.2)
MCHC: 32.1 g/dL (ref 31.8–35.4)
MCV: 91.3 fL (ref 80–97)
MPV: 8.6 fL (ref 0–99.8)
PLATELET COUNT, POC: 200 10*3/uL (ref 142–424)
POC GRANULOCYTE: 3.4 (ref 2–6.9)
POC LYMPH PERCENT: 32.8 %L (ref 10–50)
RBC: 4.5 M/uL (ref 4.04–5.48)
RDW, POC: 14.5 %
WBC: 5.5 10*3/uL (ref 4.6–10.2)

## 2013-07-09 MED ORDER — DOXYCYCLINE HYCLATE 100 MG PO TABS: ORAL_TABLET | ORAL | Status: DC

## 2013-07-09 NOTE — Telephone Encounter (Signed)
Appt given for today per patients request 

## 2013-07-09 NOTE — Progress Notes (Signed)
   Subjective:    Patient ID: [name], female    DOB: October 26, 1940, 73 y.o.   MRN: [id number]  HPI Pt is here today for sinus pressure, drainage, cough and fatigue. Pt has also had 2 tick bites in the last month.   Review of Systems  Constitutional: Positive for fever.  HENT: Positive for sinus pressure. Negative for ear pain and sore throat.   Respiratory: Positive for cough.   Musculoskeletal: Positive for myalgias.  Neurological: Positive for headaches (sinus pressure).             Objective:   Physical Exam  Nursing note and vitals reviewed. Constitutional: She is oriented to person, place, and time. She appears well-developed and well-nourished. No distress.  HENT:  Head: Normocephalic and atraumatic.  Right Ear: External ear normal.  Left Ear: External ear normal.  Mouth/Throat: Oropharynx is clear and moist.  Nasal congestion bilaterally  Eyes: Conjunctivae and EOM are normal. Pupils are equal, round, and reactive to light. Right eye exhibits no discharge. Left eye exhibits no discharge. No scleral icterus.  Neck: Normal range of motion. Neck supple. No thyromegaly present.  Cardiovascular: Normal rate, regular rhythm, normal heart sounds and intact distal pulses.  Exam reveals no gallop and no friction rub.   No murmur heard. Pulmonary/Chest: Effort normal. No respiratory distress. She has no wheezes. She has no rales. She exhibits no tenderness.  Congested cough no rales or wheezes  Abdominal: Soft. Bowel sounds are normal. She exhibits no mass. There is no tenderness. There is no rebound and no guarding.  Musculoskeletal: Normal range of motion. She exhibits no edema and no tenderness.  Lymphadenopathy:    She has no cervical adenopathy.  Neurological: She is alert and oriented to person, place, and time.  Skin: Skin is warm and dry. No rash noted.  Psychiatric: She has a normal mood and affect. Her behavior is normal. Judgment and thought content normal.    BP 105/69  Pulse 94  Temp(Src) 97.6 F (36.4 C) (Oral)  Ht $R'5\' 10"'wU$  (1.778 m)  Wt 172 lb 6.4 oz (78.2 kg)  BMI 24.74 kg/m2  [facility] reading (PRIMARY) by  DrMoore-chest x-ray  ---no active disease                                      Assessment & Plan:  1. Other fatigue - POCT CBC - Thyroid Panel With TSH - BMP8+EGFR  2. Tick bite of abdomen, initial encounter - Lyme Ab/Western Blot Reflex - Rocky mtn spotted fvr abs pnl(IgG+IgM) - doxycycline (VIBRA-TABS) 100 MG tablet; 1 twice daily with food  Dispense: 28 tablet; Refill: 0  3. Acute bronchitis, unspecified organism - DG Chest 2 View; Future - doxycycline (VIBRA-TABS) 100 MG tablet; 1 twice daily with food  Dispense: 28 tablet; Refill: 0  4. Hypothyroidism, unspecified hypothyroidism type  Patient Instructions  Take Mucinex maximum strength, blue and white in color, 1 twice daily with a large glass of water as needed for cough and congestion Take antibiotic as directed We will call you with the results of the lab work and a chest x-ray report is soon as those results are available Drink plenty of fluids Take Tylenol as needed for aches pains and fever   Arrie Senate MD

## 2013-07-09 NOTE — Patient Instructions (Signed)
Take Mucinex maximum strength, blue and white in color, 1 twice daily with a large glass of water as needed for cough and congestion Take antibiotic as directed We will call you with the results of the lab work and a chest x-ray report is soon as those results are available Drink plenty of fluids Take Tylenol as needed for aches pains and fever

## 2013-07-10 ENCOUNTER — Telehealth: Payer: Self-pay

## 2013-07-10 NOTE — Telephone Encounter (Signed)
Message copied by Koren Bound on Tue Jul 10, 2013  9:08 AM ------      Message from: Chipper Herb      Created: Mon Jul 09, 2013  4:48 PM       As per radiology report---emphysematous changes, but no significant change from previous x-ray t ------

## 2013-07-10 NOTE — Telephone Encounter (Signed)
Pt aware of CXR results.

## 2013-07-11 LAB — BMP8+EGFR
BUN/Creatinine Ratio: 16 (ref 11–26)
BUN: 11 mg/dL (ref 8–27)
CO2: 26 mmol/L (ref 18–29)
CREATININE: 0.67 mg/dL (ref 0.57–1.00)
Calcium: 9.2 mg/dL (ref 8.7–10.3)
Chloride: 105 mmol/L (ref 97–108)
GFR calc non Af Amer: 88 mL/min/{1.73_m2} (ref >59)
GFR, EST AFRICAN AMERICAN: 102 mL/min/{1.73_m2} (ref >59)
Glucose: 104 mg/dL — ABNORMAL HIGH (ref 65–99)
POTASSIUM: 4 mmol/L (ref 3.5–5.2)
SODIUM: 148 mmol/L — AB (ref 134–144)

## 2013-07-11 LAB — ROCKY MTN SPOTTED FVR ABS PNL(IGG+IGM)
RMSF IgG: NEGATIVE
RMSF IgM: 0.27 index (ref 0.00–0.89)

## 2013-07-11 LAB — LYME AB/WESTERN BLOT REFLEX: LYME DISEASE AB, QUANT, IGM: <0.8 index (ref 0.00–0.79)

## 2013-07-24 ENCOUNTER — Telehealth: Payer: Self-pay | Admitting: *Deleted

## 2013-07-24 MED ORDER — LEVOTHYROXINE SODIUM 125 MCG PO TABS: ORAL_TABLET | ORAL | Status: DC

## 2013-07-24 NOTE — Telephone Encounter (Signed)
Was thyroid panel done? Looks like it was ordered but not sure if it was done. Please advise

## 2013-08-06 ENCOUNTER — Ambulatory Visit (INDEPENDENT_AMBULATORY_CARE_PROVIDER_SITE_OTHER): Payer: Medicare Other | Admitting: *Deleted

## 2013-08-06 DIAGNOSIS — E538 Deficiency of other specified B group vitamins: Secondary | ICD-10-CM

## 2013-08-06 MED ORDER — EZETIMIBE 10 MG PO TABS: 10.0000 mg | ORAL_TABLET | Freq: Every day | ORAL | Status: DC

## 2013-08-06 NOTE — Patient Instructions (Signed)
Vitamin B12 Injections Every person needs vitamin B12. A deficiency develops when the body does not get enough of it. One way to overcome this is by getting B12 shots (injections). A B12 shot puts the vitamin directly into muscle tissue. This avoids any problems your body might have in absorbing it from food or a pill. In some people, the body has trouble using the vitamin correctly. This can cause a B12 deficiency. Not consuming enough of the vitamin can also cause a deficiency. Getting enough vitamin B12 can be hard for elderly people. Sometimes, they do not eat a well-balanced diet. The elderly are also more likely than younger people to have medical conditions or take medications that can lead to a deficiency. WHAT DOES VITAMIN B12 DO? Vitamin B12 does many things to help the body work right:  It helps the body make healthy red blood cells.  It helps maintain nerve cells.  It is involved in the body's process of converting food into energy (metabolism).  It is needed to make the genetic material in all cells (DNA). VITAMIN B12 FOOD SOURCES Most people get plenty of vitamin B12 through the foods they eat. It is present in:  Meat, fish, poultry, and eggs.  Milk and milk products.  It also is added when certain foods are made, including some breads, cereals and yogurts. The food is then called "fortified". CAUSES The most common causes of vitamin B12 deficiency are:  Pernicious anemia. The condition develops when the body cannot make enough healthy red blood cells. This stems from a lack of a protein made in the stomach (intrinsic factor). People without this protein cannot absorb enough vitamin B12 from food.  Malabsorption. This is when the body cannot absorb the vitamin. It can be caused by:  Pernicious anemia.  Surgery to remove part or all of the stomach can lead to malabsorption. Removal of part or all of the small intestine can also cause malabsorption.  Vegetarian diet.  People who are strict about not eating foods from animals could have trouble taking in enough vitamin B12 from diet alone.  Medications. Some medicines have been linked to B12 deficiency, such as Metformin (a drug prescribed for type 2 diabetes). Long-term use of stomach acid suppressants also can keep the vitamin from being absorbed.  Intestinal problems such as inflammatory bowel disease. If there are problems in the digestive tract, vitamin B12 may not be absorbed in good enough amounts. SYMPTOMS People who do not get enough B12 can develop problems. These can include:  Anemia. This is when the body has too few red blood cells. Red blood cells carry oxygen to the rest of the body. Without a healthy supply of red blood cells, people can feel:  Tired (fatigued).  Weak.  Severe anemia can cause:  Shortness of breath.  Dizziness.  Rapid heart rate.  Paleness.  Other Vitamin B12 deficiency symptoms include:  Diarrhea.  Numbness or tingling in the hands or feet.  Loss of appetite.  Confusion.  Sores on the tongue or in the mouth. LET YOUR CAREGIVER KNOW ABOUT:  Any allergies. It is very important to know if you are allergic or sensitive to cobalt. Vitamin B12 contains cobalt.  Any history of kidney disease.  All medications you are taking. Include prescription and over-the-counter medicines, herbs and creams.  Whether you are pregnant or breast-feeding.  If you have Leber's disease, a hereditary eye condition, vitamin B12 could make it worse. RISKS AND COMPLICATIONS Reactions to an injection are   usually temporary. They might include:  Pain at the injection site.  Redness, swelling or tenderness at the site.  Headache, dizziness or weakness.  Nausea, upset stomach or diarrhea.  Numbness or tingling.  Fever.  Joint pain.  Itching or rash. If a reaction does not go away in a short while, talk with your healthcare provider. A change in the way the shots are  given, or where they are given, might need to be made. BEFORE AN INJECTION To decide whether B12 injections are right for you, your healthcare provider will probably:  Ask about your medical history.  Ask questions about your diet.  Ask about symptoms such as:  Have you felt weak?  Do you feel unusually tired?  Do you get dizzy?  Order blood tests. These may include a test to:  Check the level of red cells in your blood.  Measure B12 levels.  Check for the presence of intrinsic factor. VITAMIN B12 INJECTIONS How often you will need a vitamin B12 injection will depend on how severe your deficiency is. This also will affect how long you will need to get them. People with pernicious anemia usually get injections for their entire life. Others might get them for a shorter period. For many people, injections are given daily or weekly for several weeks. Then, once B12 levels are normal, injections are given just once a month. If the cause of the deficiency can be fixed, the injections can be stopped. Talk with your healthcare provider about what you should expect. For an injection:  The injection site will be cleaned with an alcohol swab.  Your healthcare provider will insert a needle directly into a muscle. Most any muscle can be used. Most often, an arm muscle is used. A buttocks muscle can also be used. Many people say shots in that area are less painful.  A small adhesive bandage may be put over the injection site. It usually can be taken off in an hour or less. Injections can be given by your healthcare provider. In some cases, family members give them. Sometimes, people give them to themselves. Talk with your healthcare provider about what would be best for you. If someone other than your healthcare provider will be giving the shots, the person will need to be trained to give them correctly. HOME CARE INSTRUCTIONS   You can remove the adhesive bandage within an hour of getting a  shot.  You should be able to go about your normal activities right away.  Avoid drinking large amounts of alcohol while taking vitamin B12 shots. Alcohol can interfere with the body's use of the vitamin. SEEK MEDICAL CARE IF:   Pain, redness, swelling or tenderness at the injection site does not get better or gets worse.  Headache, dizziness or weakness does not go away.  You develop a fever of more than 100.5 F (38.1 C). SEEK IMMEDIATE MEDICAL CARE IF:   You have chest pain.  You develop shortness of breath.  You have muscle weakness that gets worse.  You develop numbness, weakness or tingling on one side or one area of the body.  You have symptoms of an allergic reaction, such as:  Hives.  Difficulty breathing.  Swelling of the lips, face, tongue or throat.  You develop a fever of more than 102.0 F (38.9 C). MAKE SURE YOU:   Understand these instructions.  Will watch your condition.  Will get help right away if you are not doing well or get worse. Document   Released: 03/19/2008 Document Revised: 03/15/2011 Document Reviewed: 03/19/2008 Exit[employer] Patient Information 2015 [employer]. This information is not intended to replace advice given to you by your health care provider. Make sure you discuss any questions you have with your health care provider.  

## 2013-08-06 NOTE — Progress Notes (Signed)
Vitamin b12 given and tolerated well. 

## 2013-08-22 ENCOUNTER — Other Ambulatory Visit: Payer: Self-pay | Admitting: Family Medicine

## 2013-08-24 ENCOUNTER — Other Ambulatory Visit: Payer: Self-pay | Admitting: *Deleted

## 2013-08-24 MED ORDER — LISINOPRIL-HYDROCHLOROTHIAZIDE 10-12.5 MG PO TABS: ORAL_TABLET | ORAL | Status: DC

## 2013-09-05 ENCOUNTER — Ambulatory Visit: Payer: Medicare Other

## 2013-09-06 ENCOUNTER — Ambulatory Visit (INDEPENDENT_AMBULATORY_CARE_PROVIDER_SITE_OTHER): Payer: Medicare Other | Admitting: *Deleted

## 2013-09-06 DIAGNOSIS — E538 Deficiency of other specified B group vitamins: Secondary | ICD-10-CM

## 2013-09-06 NOTE — Progress Notes (Signed)
Patient tolerated well.

## 2013-09-07 ENCOUNTER — Ambulatory Visit: Payer: Medicare Other

## 2013-09-12 ENCOUNTER — Other Ambulatory Visit: Payer: Self-pay | Admitting: Family Medicine

## 2013-09-18 ENCOUNTER — Encounter: Payer: Self-pay | Admitting: Family Medicine

## 2013-09-18 ENCOUNTER — Ambulatory Visit (INDEPENDENT_AMBULATORY_CARE_PROVIDER_SITE_OTHER): Payer: Medicare Other | Admitting: Family Medicine

## 2013-09-18 VITALS — BP 139/76 | HR 87 | Temp 97.4°F | Ht 70.0 in | Wt 171.0 lb

## 2013-09-18 DIAGNOSIS — E559 Vitamin D deficiency, unspecified: Secondary | ICD-10-CM

## 2013-09-18 DIAGNOSIS — K219 Gastro-esophageal reflux disease without esophagitis: Secondary | ICD-10-CM

## 2013-09-18 DIAGNOSIS — E039 Hypothyroidism, unspecified: Secondary | ICD-10-CM

## 2013-09-18 DIAGNOSIS — J449 Chronic obstructive pulmonary disease, unspecified: Secondary | ICD-10-CM

## 2013-09-18 DIAGNOSIS — E785 Hyperlipidemia, unspecified: Secondary | ICD-10-CM

## 2013-09-18 DIAGNOSIS — G500 Trigeminal neuralgia: Secondary | ICD-10-CM

## 2013-09-18 DIAGNOSIS — E8881 Metabolic syndrome: Secondary | ICD-10-CM

## 2013-09-18 DIAGNOSIS — I10 Essential (primary) hypertension: Secondary | ICD-10-CM

## 2013-09-18 MED ORDER — LEVOTHYROXINE SODIUM 125 MCG PO TABS: ORAL_TABLET | ORAL | Status: DC

## 2013-09-18 NOTE — Patient Instructions (Addendum)
Medicare Annual Wellness Visit  [facility] and the medical providers at [facility] strive to bring you the best medical care.  In doing so we not only want to address your current medical conditions and concerns but also to detect new conditions early and prevent illness, disease and health-related problems.    Medicare offers a yearly Wellness Visit which allows our clinical staff to assess your need for preventative services including immunizations, lifestyle education, counseling to decrease risk of preventable diseases and screening for fall risk and other medical concerns.    This visit is provided free of charge (no copay) for all Medicare recipients. The clinical pharmacists at [facility] have begun to conduct these Wellness Visits which will also include a thorough review of all your medications.    As you primary medical provider recommend that you make an appointment for your Annual Wellness Visit if you have not done so already this year.  You may set up this appointment before you leave today or you may call back ([phone]) and schedule an appointment.  Please make sure when you call that you mention that you are scheduling your Annual Wellness Visit with the clinical pharmacist so that the appointment may be made for the proper length of time.     Continue current medications. Continue good therapeutic lifestyle changes which include good diet and exercise. Fall precautions discussed with patient. If an FOBT was given today- please return it to our front desk. If you are over 57 years old - you may need Prevnar 107 or the adult Pneumonia vaccine.  Flu Shots will be available at our office starting mid- September. Please call and schedule a FLU CLINIC APPOINTMENT.   We will do a referral to Dr. Floyde Parkins, a neurologist in [place], to further evaluate your trigeminal neuralgia

## 2013-09-18 NOTE — Addendum Note (Signed)
Addended by: Zannie Cove on: 09/18/2013 11:39 AM   Modules accepted: Orders

## 2013-09-18 NOTE — Progress Notes (Signed)
Subjective:    Patient ID: [name], female    DOB: 01-07-1940, 73 y.o.   MRN: [id number]  HPI Pt here for follow up and management of chronic medical problems. The patient is to get fasting lab work. She is also needing a refill on her thyroid medication. She indicates that she would like to shift her neurological care for her trigeminal neuralgia to [facility]. This is an easier location for her to get to. We will help her with this by making a referral to a neurologist in [place].        Patient Active Problem List   Diagnosis Date Noted  . Sleep apnea 05/15/2013  . Hypothyroid 05/15/2013  . HTN (hypertension) 05/15/2013  . Vitamin D deficiency 05/15/2013  . Hyperlipidemia 05/15/2013  . Osteopenia 10/25/2012  . Mild cognitive impairment with memory loss 09/27/2012  . Trigeminal neuralgia 04/02/2011  . Dysphagia 07/23/2010  . GERD (gastroesophageal reflux disease) 07/23/2010  . Right facial pain 07/23/2010  . ANXIETY 05/24/2008  . COPD 05/24/2008  . CONSTIPATION 05/24/2008  . ARTHRITIS 05/24/2008  . Idiopathic osteoporosis 05/24/2008  . ADENOCARCINOMA, BREAST 05/22/2008  . INTERNAL HEMORRHOIDS 05/22/2008  . COLONIC POLYPS, ADENOMATOUS, HX OF 05/22/2008   Outpatient Encounter Prescriptions as of 09/18/2013  Medication Sig  . aspirin 81 MG tablet Take 81 mg by mouth daily.    . Calcium Carbonate-Vitamin D (CALCIUM 500/D) 500-125 MG-UNIT TABS Take 1 capsule by mouth daily.    . Cholecalciferol (VITAMIN D3) 2000 UNITS capsule Take 4,000 Units by mouth daily.   Marland Kitchen donepezil (ARICEPT) 10 MG tablet Take 10 mg by mouth daily.  Marland Kitchen gabapentin (NEURONTIN) 300 MG capsule Takes 1 tab in am and 2 tabs at night  . ibuprofen (ADVIL,MOTRIN) 200 MG tablet Take 400 mg by mouth every 6 (six) hours as needed.  . lansoprazole (PREVACID) 15 MG capsule Take 15 mg by mouth as needed. For heartburn  . levothyroxine (SYNTHROID, LEVOTHROID) 125 MCG tablet TAKE (1) TABLET BY MOUTH DAILY.  Marland Kitchen  lisinopril-hydrochlorothiazide (PRINZIDE,ZESTORETIC) 10-12.5 MG per tablet TAKE (1) TABLET BY MOUTH DAILY.  . Multiple Vitamin (MULTIVITAMIN) capsule Take 1 capsule by mouth daily.    . nortriptyline (PAMELOR) 25 MG capsule Take 3 capsules by mouth at bedtime and may repeat dose one time if needed.  . Omega-3 Fatty Acids (FISH OIL) 1000 MG CAPS Take 1 capsule by mouth daily.    Marland Kitchen senna-docusate (SENOKOT S) 8.6-50 MG per tablet Take 1 tablet by mouth daily as needed for constipation.  Marland Kitchen ZETIA 10 MG tablet TAKE (1) TABLET BY MOUTH DAILY.  . [DISCONTINUED] doxycycline (VIBRA-TABS) 100 MG tablet 1 twice daily with food    Review of Systems  Constitutional: Negative.   HENT: Negative.   Eyes: Negative.   Respiratory: Negative.   Cardiovascular: Negative.   Gastrointestinal: Negative.   Endocrine: Negative.   Genitourinary: Negative.   Musculoskeletal: Negative.   Skin: Negative.   Allergic/Immunologic: Negative.   Neurological: Negative.   Hematological: Negative.   Psychiatric/Behavioral: Negative.        Objective:   Physical Exam  Nursing note and vitals reviewed. Constitutional: She is oriented to person, place, and time. She appears well-developed and well-nourished. No distress.  The patient is pleasant, alert, and cooperative.  HENT:  Head: Normocephalic and atraumatic.  Right Ear: External ear normal.  Left Ear: External ear normal.  Mouth/Throat: Oropharynx is clear and moist. No oropharyngeal exudate.  There is nasal congestion bilaterally  Eyes: Conjunctivae and EOM  are normal. Pupils are equal, round, and reactive to light. Right eye exhibits no discharge. Left eye exhibits no discharge. No scleral icterus.  Neck: Normal range of motion. Neck supple. No JVD present. No thyromegaly present.  There are no carotid bruits  Cardiovascular: Normal rate, regular rhythm, normal heart sounds and intact distal pulses.  Exam reveals no gallop and no friction rub.   No murmur  heard. At 72 per minute  Pulmonary/Chest: Effort normal and breath sounds normal. No respiratory distress. She has no wheezes. She has no rales. She exhibits no tenderness.  The lungs are now clear anteriorly and posteriorly  Abdominal: Soft. Bowel sounds are normal. She exhibits no mass. There is no tenderness. There is no rebound and no guarding.  Musculoskeletal: Normal range of motion. She exhibits no edema and no tenderness.  Lymphadenopathy:    She has no cervical adenopathy.  Neurological: She is alert and oriented to person, place, and time. She has normal reflexes. No cranial nerve deficit.  Skin: Skin is warm and dry. No rash noted. No pallor.  Psychiatric: She has a normal mood and affect. Her behavior is normal. Judgment and thought content normal.   BP 139/76  Pulse 87  Temp(Src) 97.4 F (36.3 C) (Oral)  Ht '5\' 10"'  (1.778 m)  Wt 171 lb (77.565 kg)  BMI 24.54 kg/m2        Assessment & Plan:   1. COPD - POCT CBC; Future  2. Gastroesophageal reflux disease, esophagitis presence not specified - POCT CBC; Future  3. Essential hypertension - POCT CBC; Future - BMP8+EGFR; Future - Hepatic function panel; Future  4. Hyperlipidemia - POCT CBC; Future - NMR, lipoprofile; Future  5. Hypothyroidism, unspecified hypothyroidism type - POCT CBC; Future  6. Vitamin D deficiency - POCT CBC; Future - Vit D  25 hydroxy (rtn osteoporosis monitoring); Future  7. Metabolic syndrome - POCT glycosylated hemoglobin (Hb A1C); Future  8. Trigeminal neuralgia  9. Hoarseness -This patient has seen an ENT in the past and that evaluation was negative -Some of this may be medication related  Meds ordered this encounter  Medications  . levothyroxine (SYNTHROID, LEVOTHROID) 125 MCG tablet    Sig: TAKE (1) TABLET BY MOUTH DAILY.    Dispense:  90 tablet    Refill:  3   Patient Instructions                       Medicare Annual Wellness Visit  [facility] and the medical  providers at [facility] strive to bring you the best medical care.  In doing so we not only want to address your current medical conditions and concerns but also to detect new conditions early and prevent illness, disease and health-related problems.    Medicare offers a yearly Wellness Visit which allows our clinical staff to assess your need for preventative services including immunizations, lifestyle education, counseling to decrease risk of preventable diseases and screening for fall risk and other medical concerns.    This visit is provided free of charge (no copay) for all Medicare recipients. The clinical pharmacists at [facility] have begun to conduct these Wellness Visits which will also include a thorough review of all your medications.    As you primary medical provider recommend that you make an appointment for your Annual Wellness Visit if you have not done so already this year.  You may set up this appointment before you leave today or  you may call back ([phone]) and schedule an appointment.  Please make sure when you call that you mention that you are scheduling your Annual Wellness Visit with the clinical pharmacist so that the appointment may be made for the proper length of time.     Continue current medications. Continue good therapeutic lifestyle changes which include good diet and exercise. Fall precautions discussed with patient. If an FOBT was given today- please return it to our front desk. If you are over 94 years old - you may need Prevnar 65 or the adult Pneumonia vaccine.  Flu Shots will be available at our office starting mid- September. Please call and schedule a FLU CLINIC APPOINTMENT.   We will do a referral to Dr. Floyde Parkins, a neurologist in [place], to further evaluate your trigeminal neuralgia   Arrie Senate MD

## 2013-09-24 ENCOUNTER — Encounter: Payer: Self-pay | Admitting: Neurology

## 2013-09-24 ENCOUNTER — Ambulatory Visit (INDEPENDENT_AMBULATORY_CARE_PROVIDER_SITE_OTHER): Payer: Medicare Other | Admitting: Neurology

## 2013-09-24 VITALS — BP 150/80 | HR 64 | Ht 69.5 in | Wt 173.0 lb

## 2013-09-24 DIAGNOSIS — R413 Other amnesia: Secondary | ICD-10-CM

## 2013-09-24 DIAGNOSIS — G500 Trigeminal neuralgia: Secondary | ICD-10-CM

## 2013-09-24 DIAGNOSIS — G63 Polyneuropathy in diseases classified elsewhere: Secondary | ICD-10-CM

## 2013-09-24 HISTORY — DX: Other amnesia: R41.3

## 2013-09-24 HISTORY — DX: Polyneuropathy in diseases classified elsewhere: G63

## 2013-09-24 NOTE — Progress Notes (Signed)
Reason for visit: Trigeminal neuralgia  [name] is a 73 y.o. female  History of present illness:  [name] is a 73 year old right-handed white female with a history of right sided trigeminal neuralgia involving the V2 and V1 distributions. The patient indicates that she began having right ear discomfort four years ago, but the actual neuralgia pain into the face did not occur until 2013. The patient began having pain into the maxillary area, and pain into the right forehead. The patient describes discomfort as an electrical shock sensation. The patient believes that there is some numbness and tingling on the right face as well. The patient denies any difficulty with double vision, loss of vision, difficulty with chewing or swallowing. The patient has been followed by Dr. Rosezella Florida from neurology, and the patient is been treated with nortriptyline and gabapentin with good success. The patient has been told she has a mild peripheral neuropathy, but she also has chronic neck and low back pain. The patient has been receiving trigger point injections, but she indicates that these injections oftentimes cause headaches for her. The patient tries to stretch on a regular basis. The patient also indicates that she has some issues with memory. The patient has been on Aricept. The patient recently has noted that her voice is changing, becoming somewhat raspy. The patient is on Prevacid in low dose, but she does not take this on a regular basis. The patient comes to this office for an evaluation. The patient indicates that she also has a history of bilateral carpal tunnel syndrome. She has had nerve conduction studies in the past, she has had a CAT scan of the brain, but she has never had MRI evaluation of the brain.  Past Medical History  Diagnosis Date  . Arthropathy, unspecified, site unspecified   . Anxiety state, unspecified   . Chronic airway obstruction, not elsewhere classified   . Headache(784.0)    . Other and unspecified hyperlipidemia   . Unspecified hypothyroidism   . Personal history of colonic polyps     adenomatous  . Internal hemorrhoids without mention of complication   . Other dysphagia   . Esophageal reflux   . Pain in joint, multiple sites   . Anal or rectal pain   . Shingles   . Hiatal hernia   . Esophageal stricture   . Malignant neoplasm of breast (female), unspecified site   . Polyneuropathy in other diseases classified elsewhere 09/24/2013  . Memory disorder 09/24/2013  . Carpal tunnel syndrome     Bilateral    Past Surgical History  Procedure Laterality Date  . Back surgery      X2  . Mastectomy      left  . Breast lumpectomy    . Thyroidectomy    . Tubal ligation      Family History  Problem Relation Age of Onset  . Bipolar disorder Sister     Social history:  reports that she has never smoked. She has never used smokeless tobacco. She reports that she does not drink alcohol or use illicit drugs.  Medications:  Current Outpatient Prescriptions on File Prior to Visit  Medication Sig Dispense Refill  . aspirin 81 MG tablet Take 81 mg by mouth daily.        . Calcium Carbonate-Vitamin D (CALCIUM 500/D) 500-125 MG-UNIT TABS Take 1 capsule by mouth daily.        . Cholecalciferol (VITAMIN D3) 2000 UNITS capsule Take 4,000 Units by mouth daily.       Marland Kitchen  donepezil (ARICEPT) 10 MG tablet Take 10 mg by mouth daily.      Marland Kitchen gabapentin (NEURONTIN) 300 MG capsule Takes 1 tab in am and 2 tabs at night      . ibuprofen (ADVIL,MOTRIN) 200 MG tablet Take 400 mg by mouth every 6 (six) hours as needed.      . lansoprazole (PREVACID) 15 MG capsule Take 15 mg by mouth as needed. For heartburn      . levothyroxine (SYNTHROID, LEVOTHROID) 125 MCG tablet TAKE (1) TABLET BY MOUTH DAILY.  90 tablet  3  . lisinopril-hydrochlorothiazide (PRINZIDE,ZESTORETIC) 10-12.5 MG per tablet TAKE (1) TABLET BY MOUTH DAILY.  30 tablet  5  . Multiple Vitamin (MULTIVITAMIN) capsule Take  1 capsule by mouth daily.        . nortriptyline (PAMELOR) 25 MG capsule Take 3 capsules by mouth at bedtime and may repeat dose one time if needed.      . Omega-3 Fatty Acids (FISH OIL) 1000 MG CAPS Take 1 capsule by mouth daily.        Marland Kitchen senna-docusate (SENOKOT S) 8.6-50 MG per tablet Take 1 tablet by mouth daily as needed for constipation.      Marland Kitchen ZETIA 10 MG tablet TAKE (1) TABLET BY MOUTH DAILY.  30 tablet  2   Current Facility-Administered Medications on File Prior to Visit  Medication Dose Route Frequency Provider Last Rate Last Dose  . cyanocobalamin ((VITAMIN B-12)) injection 1,000 mcg  1,000 mcg Intramuscular Q30 days Erby Pian, FNP   1,000 mcg at 09/06/13 1442      Allergies  Allergen Reactions  . Atorvastatin Other (See Comments)    Muscle cramps  . Meperidine Hcl Nausea And Vomiting    Makes me 'deathly' sick  . Statins     REACTION: Patient cannot tolerate due cramps  . Sulfonamide Derivatives Other (See Comments)    Unknown reaction    ROS:  Out of a complete 14 system review of symptoms, the patient complains only of the following symptoms, and all other reviewed systems are negative.  Excessive sweating Leg swelling Excessive thirst Restless legs Muscle cramps, neck stiffness  Blood pressure 150/80, pulse 64, height 5' 9.5" (1.765 m), weight 173 lb (78.472 kg).  Physical Exam  General: The patient is alert and cooperative at the time of the examination.  Eyes: Pupils are equal, round, and reactive to light. Discs are flat bilaterally.  Neck: The neck is supple, no carotid bruits are noted.  Respiratory: The respiratory examination is clear.  Cardiovascular: The cardiovascular examination reveals a regular rate and rhythm, no obvious murmurs or rubs are noted.  Skin: Extremities are without significant edema.  Neurologic Exam  Mental status: The patient is alert and oriented x 3 at the time of the examination. The patient has apparent normal  recent and remote memory, with an apparently normal attention span and concentration ability. Mini-Mental status examination done today shows a total score 29/30.  Cranial nerves: Facial symmetry is present. There is good sensation of the face to pinprick and soft touch bilaterally, with the exception that there is a mild decrease in pinprick and soft touch sensation on the forehead on the right. The strength of the facial muscles and the muscles to head turning and shoulder shrug are normal bilaterally. Speech is well enunciated, no aphasia or dysarthria is noted. Extraocular movements are full. Visual fields are full. The tongue is midline, and the patient has symmetric elevation of the soft palate. No  obvious hearing deficits are noted.  Motor: The motor testing reveals 5 over 5 strength of all 4 extremities, with exception that there is 4 plus/5 strength proximally in both legs with hip flexion. Good symmetric motor tone is noted throughout.  Sensory: Sensory testing is intact to pinprick, soft touch, vibration sensation, and position sense on all 4 extremities. No evidence of a stocking pattern pinprick sensory deficit is noted in the legs. No evidence of extinction is noted.  Coordination: Cerebellar testing reveals good finger-nose-finger and heel-to-shin bilaterally.  Gait and station: Gait is normal. Tandem gait is slightly unsteady. Romberg is negative. No drift is seen.  Reflexes: Deep tendon reflexes are symmetric, but are depressed bilaterally. Toes are downgoing bilaterally.   Assessment/Plan:  1. Right trigeminal neuralgia, V1 and V2 distribution  2. Memory disturbance  3. Peripheral neuropathy  4. Chronic neck and low back pain  5. History of bilateral carpal tunnel syndrome   The patient is getting good pain control currently with gabapentin taking 300 mg the morning and 600 mg in the evening. She is also on 75 mg at night of nortriptyline. The patient however, may be  having problems with reflux issues, and she also reports difficulty with memory. The nortriptyline may need to be tapered off and discontinued in the future for this reason. The patient has never undergone MRI evaluation of the brain, and this will be set up given the history of memory problems and the trigeminal neuralgia. The study will be done with and without gadolinium enhancement. The patient will followup in about 4 months. She will contact our office if new issues arise. The patient does not wish to continue trigger point injections at this time.  Jill Alexanders MD 09/24/2013 7:43 PM  [facility] [address] [place], [place] [address]  Phone [phone] Fax [phone]

## 2013-09-24 NOTE — Patient Instructions (Signed)
Trigeminal Neuralgia  Trigeminal neuralgia is a nerve disorder that causes sudden attacks of severe facial pain. It is caused by damage to the trigeminal nerve, a major nerve in the face. It is more common in women and in the elderly, although it can also happen in younger patients. Attacks last from a few seconds to several minutes and can occur from a couple of times per year to several times per day. Trigeminal neuralgia can be a very distressing and disabling condition. Surgery may be needed in very severe cases if medical treatment does not give relief.  HOME CARE INSTRUCTIONS    If your caregiver prescribed medication to help prevent attacks, take as directed.   To help prevent attacks:   Chew on the unaffected side of the mouth.   Avoid touching your face.   Avoid blasts of hot or cold air.   Men may wish to grow a beard to avoid having to shave.  SEEK IMMEDIATE MEDICAL CARE IF:   Pain is unbearable and your medicine does not help.   You develop new, unexplained symptoms (problems).   You have problems that may be related to a medication you are taking.  Document Released: 12/19/1999 Document Revised: 03/15/2011 Document Reviewed: 10/18/2008  Exit[employer] Patient Information 2015 [employer]. This information is not intended to replace advice given to you by your health care provider. Make sure you discuss any questions you have with your health care provider.

## 2013-09-28 ENCOUNTER — Other Ambulatory Visit (INDEPENDENT_AMBULATORY_CARE_PROVIDER_SITE_OTHER): Payer: Medicare Other

## 2013-09-28 DIAGNOSIS — E039 Hypothyroidism, unspecified: Secondary | ICD-10-CM

## 2013-09-28 DIAGNOSIS — J449 Chronic obstructive pulmonary disease, unspecified: Secondary | ICD-10-CM

## 2013-09-28 DIAGNOSIS — E8881 Metabolic syndrome: Secondary | ICD-10-CM

## 2013-09-28 DIAGNOSIS — E785 Hyperlipidemia, unspecified: Secondary | ICD-10-CM

## 2013-09-28 DIAGNOSIS — E559 Vitamin D deficiency, unspecified: Secondary | ICD-10-CM

## 2013-09-28 DIAGNOSIS — I10 Essential (primary) hypertension: Secondary | ICD-10-CM

## 2013-09-28 DIAGNOSIS — K219 Gastro-esophageal reflux disease without esophagitis: Secondary | ICD-10-CM

## 2013-09-28 LAB — POCT CBC
GRANULOCYTE PERCENT: 64.8 % (ref 37–80)
HCT, POC: 39.9 % (ref 37.7–47.9)
Hemoglobin: 13.1 g/dL (ref 12.2–16.2)
LYMPH, POC: 1.9 (ref 0.6–3.4)
MCH, POC: 30 pg (ref 27–31.2)
MCHC: 32.9 g/dL (ref 31.8–35.4)
MCV: 91.3 fL (ref 80–97)
MPV: 8.6 fL (ref 0–99.8)
POC GRANULOCYTE: 3.7 (ref 2–6.9)
POC LYMPH PERCENT: 32.7 %L (ref 10–50)
Platelet Count, POC: 186 10*3/uL (ref 142–424)
RBC: 4.4 M/uL (ref 4.04–5.48)
RDW, POC: 14.3 %
WBC: 5.7 10*3/uL (ref 4.6–10.2)

## 2013-09-28 LAB — POCT GLYCOSYLATED HEMOGLOBIN (HGB A1C): HEMOGLOBIN A1C: 5.2

## 2013-09-28 NOTE — Progress Notes (Signed)
Lab only 

## 2013-09-29 LAB — BMP8+EGFR
BUN/Creatinine Ratio: 20 (ref 11–26)
BUN: 12 mg/dL (ref 8–27)
CALCIUM: 9.4 mg/dL (ref 8.7–10.3)
CHLORIDE: 100 mmol/L (ref 97–108)
CO2: 26 mmol/L (ref 18–29)
Creatinine, Ser: 0.61 mg/dL (ref 0.57–1.00)
GFR calc Af Amer: 105 mL/min/{1.73_m2} (ref >59)
GFR calc non Af Amer: 91 mL/min/{1.73_m2} (ref >59)
Glucose: 86 mg/dL (ref 65–99)
Potassium: 4.3 mmol/L (ref 3.5–5.2)
Sodium: 141 mmol/L (ref 134–144)

## 2013-09-29 LAB — NMR, LIPOPROFILE
Cholesterol: 238 mg/dL — ABNORMAL HIGH (ref 100–199)
HDL CHOLESTEROL BY NMR: 67 mg/dL (ref >39)
HDL PARTICLE NUMBER: 43 umol/L (ref >=30.5)
LDL Particle Number: 2106 nmol/L — ABNORMAL HIGH (ref <1000)
LDL Size: 21 nm (ref >20.5)
LDLC SERPL CALC-MCNC: 161 mg/dL — AB (ref 0–99)
LP-IR Score: 32 (ref <=45)
Small LDL Particle Number: 1005 nmol/L — ABNORMAL HIGH (ref <=527)
Triglycerides by NMR: 48 mg/dL (ref 0–149)

## 2013-09-29 LAB — HEPATIC FUNCTION PANEL
ALBUMIN: 4.4 g/dL (ref 3.5–4.8)
ALT: 19 IU/L (ref 0–32)
AST: 18 IU/L (ref 0–40)
Alkaline Phosphatase: 61 IU/L (ref 39–117)
Bilirubin, Direct: 0.15 mg/dL (ref 0.00–0.40)
TOTAL PROTEIN: 6.2 g/dL (ref 6.0–8.5)
Total Bilirubin: 0.8 mg/dL (ref 0.0–1.2)

## 2013-09-29 LAB — VITAMIN D 25 HYDROXY (VIT D DEFICIENCY, FRACTURES): VIT D 25 HYDROXY: 51.3 ng/mL (ref 30.0–100.0)

## 2013-10-01 ENCOUNTER — Telehealth: Payer: Self-pay | Admitting: Family Medicine

## 2013-10-01 NOTE — Telephone Encounter (Signed)
Message copied by Waverly Ferrari on Mon Oct 01, 2013 11:04 AM ------      Message from: Chipper Herb      Created: Sat Sep 29, 2013 10:59 AM       The blood sugar is good at 86. The creatinine the most important kidney function test is within normal limits. The electrolytes including potassium are within normal limits.      All liver function tests are within normal limits      Cholesterol numbers with advanced lipid testing had an LDL particle number that is elevated at 2106. The LDL C. is elevated at 161. The triglycerides are good. The HDL particle number, or the good cholesterol, is good. The patient is statin intolerant. Please schedule her for a visit with the clinical pharmacist to re address her elevated cholesterol with diet and other therapy that might help get this down more----i.e. maybe a statin 2 days a week low dose      The vitamin D level is good, continue current treatment ------

## 2013-10-01 NOTE — Telephone Encounter (Signed)
Pt aware of lab results.  She wants to wait  And try controlling cholesterol on her own before scheduling with pharmacist.  rs

## 2013-10-22 ENCOUNTER — Ambulatory Visit (INDEPENDENT_AMBULATORY_CARE_PROVIDER_SITE_OTHER): Payer: Medicare Other | Admitting: *Deleted

## 2013-10-22 DIAGNOSIS — E538 Deficiency of other specified B group vitamins: Secondary | ICD-10-CM

## 2013-10-24 ENCOUNTER — Other Ambulatory Visit: Payer: Self-pay | Admitting: Neurology

## 2013-10-24 DIAGNOSIS — G500 Trigeminal neuralgia: Secondary | ICD-10-CM

## 2013-10-26 ENCOUNTER — Other Ambulatory Visit: Payer: Medicare Other

## 2013-10-29 ENCOUNTER — Ambulatory Visit (INDEPENDENT_AMBULATORY_CARE_PROVIDER_SITE_OTHER): Payer: Medicare Other

## 2013-10-29 ENCOUNTER — Other Ambulatory Visit: Payer: Self-pay | Admitting: *Deleted

## 2013-10-29 DIAGNOSIS — E785 Hyperlipidemia, unspecified: Secondary | ICD-10-CM

## 2013-10-29 DIAGNOSIS — Z23 Encounter for immunization: Secondary | ICD-10-CM

## 2013-10-29 MED ORDER — EZETIMIBE 10 MG PO TABS: 10.0000 mg | ORAL_TABLET | Freq: Every day | ORAL | Status: DC

## 2013-11-02 ENCOUNTER — Ambulatory Visit
Admission: RE | Admit: 2013-11-02 | Discharge: 2013-11-02 | Disposition: A | Discharge status: Home / Self Care | Payer: Medicare Other | Source: Ambulatory Visit | Attending: Neurology | Admitting: Neurology

## 2013-11-02 DIAGNOSIS — G500 Trigeminal neuralgia: Secondary | ICD-10-CM

## 2013-11-02 DIAGNOSIS — R413 Other amnesia: Secondary | ICD-10-CM

## 2013-11-02 DIAGNOSIS — G63 Polyneuropathy in diseases classified elsewhere: Secondary | ICD-10-CM

## 2013-11-02 MED ORDER — GADOBENATE DIMEGLUMINE 529 MG/ML IV SOLN
15.0000 mL | Freq: Once | INTRAVENOUS | Status: AC | PRN
  Administered 2013-11-02: 15 mL via INTRAVENOUS

## 2013-11-06 ENCOUNTER — Telehealth: Payer: Self-pay | Admitting: Neurology

## 2013-11-06 NOTE — Telephone Encounter (Signed)
I called patient. MRI the brain was relatively unremarkable, some minimal small vessel disease, a small left parietal cortical chronic infarct. The patient will continue her medications for now, we will follow-up in office in the near future.   MRI brain results 11/06/2013:  IMPRESSION: Slightly abnormal MRI scan of the brain with thin sections through the brain stem showing mild changes of chronic microvascular ischemia and generalized cerebral atrophy.

## 2013-11-13 ENCOUNTER — Telehealth: Payer: Self-pay | Admitting: Family Medicine

## 2013-11-13 DIAGNOSIS — Z1211 Encounter for screening for malignant neoplasm of colon: Secondary | ICD-10-CM

## 2013-11-14 NOTE — Telephone Encounter (Signed)
Pt aware that referral to pyrtle has been placed

## 2013-11-27 ENCOUNTER — Encounter: Payer: Self-pay | Admitting: Internal Medicine

## 2013-12-03 ENCOUNTER — Ambulatory Visit (INDEPENDENT_AMBULATORY_CARE_PROVIDER_SITE_OTHER): Payer: Medicare Other

## 2013-12-03 DIAGNOSIS — E538 Deficiency of other specified B group vitamins: Secondary | ICD-10-CM

## 2013-12-18 ENCOUNTER — Other Ambulatory Visit: Payer: Self-pay | Admitting: Family Medicine

## 2014-01-14 ENCOUNTER — Ambulatory Visit (INDEPENDENT_AMBULATORY_CARE_PROVIDER_SITE_OTHER): Payer: Medicare Other | Admitting: *Deleted

## 2014-01-14 DIAGNOSIS — E538 Deficiency of other specified B group vitamins: Secondary | ICD-10-CM | POA: Diagnosis not present

## 2014-01-14 NOTE — Patient Instructions (Signed)

## 2014-01-14 NOTE — Progress Notes (Signed)
Vitamin B12 injection given and tolerated well   

## 2014-01-22 ENCOUNTER — Telehealth: Payer: Self-pay | Admitting: Family Medicine

## 2014-01-23 NOTE — Telephone Encounter (Signed)
Patient aware that there are no samples available at this time. She should check back next week.

## 2014-01-24 ENCOUNTER — Ambulatory Visit (INDEPENDENT_AMBULATORY_CARE_PROVIDER_SITE_OTHER): Payer: Medicare Other | Admitting: Adult Health

## 2014-01-24 ENCOUNTER — Encounter: Payer: Self-pay | Admitting: Adult Health

## 2014-01-24 ENCOUNTER — Ambulatory Visit: Payer: Medicare Other | Admitting: Family Medicine

## 2014-01-24 VITALS — BP 140/79 | HR 86 | Ht 69.5 in | Wt 180.0 lb

## 2014-01-24 DIAGNOSIS — G63 Polyneuropathy in diseases classified elsewhere: Secondary | ICD-10-CM

## 2014-01-24 DIAGNOSIS — R413 Other amnesia: Secondary | ICD-10-CM

## 2014-01-24 DIAGNOSIS — G500 Trigeminal neuralgia: Secondary | ICD-10-CM | POA: Diagnosis not present

## 2014-01-24 NOTE — Patient Instructions (Signed)
Continue Gabapentin and nortriptyline. No refills needed.  Continue Aricept 10 mg daily.  Your memory score is stable.  If your symptoms worsen or you develop new symptoms please let us know.

## 2014-01-24 NOTE — Progress Notes (Signed)
I have read the note, and I agree with the clinical assessment and plan.  Tomothy Eddins KEITH   

## 2014-01-24 NOTE — Progress Notes (Signed)
PATIENT: [name] DOB: [birth year]  REASON FOR VISIT: follow up- trigeminal neuralgia, peripheral neuropathy and memory loss  HISTORY FROM: patient  HISTORY OF PRESENT ILLNESS: [name] is a 74 year old female with a history of right-sided trigeminal neuralgia, peripheral neuropathy and memory loss. She returns today for follow-up. The patient is currently taking gabapentin 300 mg in a.m. and 600 mg in the p.m. as well as nortriptyline 75 mg at bedtime. She reports that this controls her discomfort. Peripheral neuropathy is located in the feet and extends to above the ankle. She reports burning and tingling- more noticeable at night. She does feel the medication is helping to control the discomfort. The patient reports that her memory has remained stable. She continues to take Aricept 10 mg daily. She is able to complete all ADLs independently. She lives at home alone. She is able to operate a motor vehicle without difficulty. Denies anything due to her memory.  HISTORY 11/06/13 [name]): [name] is a 74 year old right-handed white female with a history of right sided trigeminal neuralgia involving the V2 and V1 distributions. The patient indicates that she began having right ear discomfort four years ago, but the actual neuralgia pain into the face did not occur until 2013. The patient began having pain into the maxillary area, and pain into the right forehead. The patient describes discomfort as an electrical shock sensation. The patient believes that there is some numbness and tingling on the right face as well. The patient denies any difficulty with double vision, loss of vision, difficulty with chewing or swallowing. The patient has been followed by Dr. Rosezella Florida from neurology, and the patient is been treated with nortriptyline and gabapentin with good success. The patient has been told she has a mild peripheral neuropathy, but she also has chronic neck and low back pain. The patient has been  receiving trigger point injections, but she indicates that these injections oftentimes cause headaches for her. The patient tries to stretch on a regular basis. The patient also indicates that she has some issues with memory. The patient has been on Aricept. The patient recently has noted that her voice is changing, becoming somewhat raspy. The patient is on Prevacid in low dose, but she does not take this on a regular basis. The patient comes to this office for an evaluation. The patient indicates that she also has a history of bilateral carpal tunnel syndrome. She has had nerve conduction studies in the past, she has had a CAT scan of the brain, but she has never had MRI evaluation of the brain.  REVIEW OF SYSTEMS: Out of a complete 14 system review of symptoms, the patient complains only of the following symptoms, and all other reviewed systems are negative. Trouble swallowing, shortness of breath, heat intolerance, excessive thirst   ALLERGIES: Allergies  Allergen Reactions  . Atorvastatin Other (See Comments)    Muscle cramps  . Meperidine Hcl Nausea And Vomiting    Makes me 'deathly' sick  . Statins     REACTION: Patient cannot tolerate due cramps  . Sulfonamide Derivatives Other (See Comments)    Unknown reaction    HOME MEDICATIONS: Outpatient Prescriptions Prior to Visit  Medication Sig Dispense Refill  . aspirin 81 MG tablet Take 81 mg by mouth daily.      . Calcium Carbonate-Vitamin D (CALCIUM 500/D) 500-125 MG-UNIT TABS Take 1 capsule by mouth daily.      . Cholecalciferol (VITAMIN D3) 2000 UNITS capsule Take 4,000 Units by mouth  daily.     . donepezil (ARICEPT) 10 MG tablet Take 10 mg by mouth daily.    Marland Kitchen gabapentin (NEURONTIN) 300 MG capsule Takes 1 tab in am and 2 tabs at night    . ibuprofen (ADVIL,MOTRIN) 200 MG tablet Take 400 mg by mouth every 6 (six) hours as needed.    . lansoprazole (PREVACID) 15 MG capsule Take 15 mg by mouth as needed. For heartburn    .  levothyroxine (SYNTHROID, LEVOTHROID) 125 MCG tablet TAKE (1) TABLET BY MOUTH DAILY. 90 tablet 3  . lisinopril-hydrochlorothiazide (PRINZIDE,ZESTORETIC) 10-12.5 MG per tablet TAKE (1) TABLET BY MOUTH DAILY. 30 tablet 5  . Multiple Vitamin (MULTIVITAMIN) capsule Take 1 capsule by mouth daily.      . nortriptyline (PAMELOR) 25 MG capsule Take 3 capsules by mouth at bedtime and may repeat dose one time if needed.    . Omega-3 Fatty Acids (FISH OIL) 1000 MG CAPS Take 1 capsule by mouth daily.      Marland Kitchen senna-docusate (SENOKOT S) 8.6-50 MG per tablet Take 1 tablet by mouth daily as needed for constipation.    Marland Kitchen ZETIA 10 MG tablet TAKE (1) TABLET BY MOUTH DAILY. (Patient not taking: Reported on 01/24/2014) 30 tablet 2   Facility-Administered Medications Prior to Visit  Medication Dose Route Frequency Provider Last Rate Last Dose  . cyanocobalamin ((VITAMIN B-12)) injection 1,000 mcg  1,000 mcg Intramuscular Q30 days Erby Pian, FNP   1,000 mcg at 01/14/14 1546    PAST MEDICAL HISTORY: Past Medical History  Diagnosis Date  . Arthropathy, unspecified, site unspecified   . Anxiety state, unspecified   . Chronic airway obstruction, not elsewhere classified   . Headache(784.0)   . Other and unspecified hyperlipidemia   . Unspecified hypothyroidism   . Personal history of colonic polyps     adenomatous  . Internal hemorrhoids without mention of complication   . Other dysphagia   . Esophageal reflux   . Pain in joint, multiple sites   . Anal or rectal pain   . Shingles   . Hiatal hernia   . Esophageal stricture   . Malignant neoplasm of breast (female), unspecified site   . Polyneuropathy in other diseases classified elsewhere 09/24/2013  . Memory disorder 09/24/2013  . Carpal tunnel syndrome     Bilateral    PAST SURGICAL HISTORY: Past Surgical History  Procedure Laterality Date  . Back surgery      X2  . Mastectomy      left  . Breast lumpectomy    . Thyroidectomy    . Tubal  ligation     PHYSICAL EXAM  Filed Vitals:   01/24/14 1103  BP: 140/79  Pulse: 86  Height: 5' 9.5" (1.765 m)  Weight: 180 lb (81.647 kg)   Body mass index is 26.21 kg/(m^2).  Generalized: Well developed, in no acute distress   Neurological examination  Mentation: Alert oriented to time, place, history taking. Follows all commands speech and language fluent. MMSE 29/30 Cranial nerve II-XII: Pupils were equal round reactive to light. Extraocular movements were full, visual field were full on confrontational test. Facial sensation and strength were normal. Uvula tongue midline. Head turning and shoulder shrug  were normal and symmetric. Motor: The motor testing reveals 5 over 5 strength of all 4 extremities. Good symmetric motor tone is noted throughout.  Sensory: Sensory testing is intact to soft touch on all 4 extremities. No evidence of extinction is noted.  Coordination: Cerebellar testing reveals  good finger-nose-finger and heel-to-shin bilaterally.  Gait and station: Gait is normal. Tandem gait is normal. Romberg is negative. No drift is seen.  Reflexes: Deep tendon reflexes are symmetric and normal bilaterally.    DIAGNOSTIC DATA (LABS, IMAGING, TESTING) - I reviewed patient records, labs, notes, testing and imaging myself where available.  Lab Results  Component Value Date   WBC 5.7 09/28/2013   HGB 13.1 09/28/2013   HCT 39.9 09/28/2013   MCV 91.3 09/28/2013   PLT 235 04/23/2013      Component Value Date/Time   NA 141 09/28/2013 0823   NA 144 04/23/2013 1549   NA 139 07/10/2012 1056   K 4.3 09/28/2013 0823   K 3.5 04/23/2013 1549   CL 100 09/28/2013 0823   CO2 26 09/28/2013 0823   CO2 25 04/23/2013 1549   GLUCOSE 86 09/28/2013 0823   GLUCOSE 97 04/23/2013 1549   GLUCOSE 107* 07/10/2012 1056   BUN 12 09/28/2013 0823   BUN 20.1 04/23/2013 1549   BUN 13 07/10/2012 1056   CREATININE 0.61 09/28/2013 0823   CREATININE 0.7 04/23/2013 1549   CREATININE 0.50  07/10/2012 1056   CALCIUM 9.4 09/28/2013 0823   CALCIUM 10.3 04/23/2013 1549   PROT 6.2 09/28/2013 0823   PROT 6.8 04/23/2013 1549   PROT 6.9 07/10/2012 1056   ALBUMIN 4.1 04/23/2013 1549   ALBUMIN 4.6 07/10/2012 1056   AST 18 09/28/2013 0823   AST 31 04/23/2013 1549   ALT 19 09/28/2013 0823   ALT 32 04/23/2013 1549   ALKPHOS 61 09/28/2013 0823   ALKPHOS 74 04/23/2013 1549   BILITOT 0.8 09/28/2013 0823   BILITOT 0.53 04/23/2013 1549   GFRNONAA 91 09/28/2013 0823   GFRNONAA >89 07/10/2012 1056   GFRAA 105 09/28/2013 0823   GFRAA >89 07/10/2012 1056   Lab Results  Component Value Date   CHOL 238* 09/28/2013   HDL 67 09/28/2013   LDLCALC 161* 09/28/2013   TRIG 48 09/28/2013   CHOLHDL 4.4 05/15/2013   Lab Results  Component Value Date   HGBA1C 5.2 09/28/2013   Lab Results  Component Value Date   VITAMINB12 282 04/04/2013   Lab Results  Component Value Date   TSH 0.663 07/10/2012      ASSESSMENT AND PLAN 74 y.o. year old female  has a past medical history of Arthropathy, unspecified, site unspecified; Anxiety state, unspecified; Chronic airway obstruction, not elsewhere classified; Headache(784.0); Other and unspecified hyperlipidemia; Unspecified hypothyroidism; Personal history of colonic polyps; Internal hemorrhoids without mention of complication; Other dysphagia; Esophageal reflux; Pain in joint, multiple sites; Anal or rectal pain; Shingles; Hiatal hernia; Esophageal stricture; Malignant neoplasm of breast (female), unspecified site; Polyneuropathy in other diseases classified elsewhere (09/24/2013); Memory disorder (09/24/2013); and Carpal tunnel syndrome. here with    1. Trigeminal neuralgia 2. Peripheral neuropathy 3. Memory loss:  Overall patient is doing well. Her trigeminal neuralgia is with gabapentin 300 mg in the morning and 600 mg in the evening as well as nortriptyline 75 mg at bedtime. Her peripheral neuropathy discomfort is controlled.  Patient's memory  has remained stable. Her MMSE today is 29/30 was previously 29/30. She will continue taking Aricept 10 mg daily. If her symptoms worsen or she develops new symptoms she will let us know. Otherwise she will follow up in 6 months or sooner if needed.  Ward Givens, MSN, NP-C 01/24/2014, 11:08 AM [facility] [address], [place], [place] [address] [phone]  Note: This document was prepared  with digital dictation and possible smart phrase technology. Any transcriptional errors that result from this process are unintentional.

## 2014-02-04 ENCOUNTER — Ambulatory Visit (AMBULATORY_SURGERY_CENTER): Payer: Self-pay | Admitting: *Deleted

## 2014-02-04 VITALS — Ht 69.5 in | Wt 180.6 lb

## 2014-02-04 DIAGNOSIS — Z86010 Personal history of colonic polyps: Secondary | ICD-10-CM

## 2014-02-04 MED ORDER — MOVIPREP 100 G PO SOLR: 1.0000 | Freq: Once | ORAL | Status: DC

## 2014-02-04 NOTE — Progress Notes (Signed)
Denies allergies to eggs or soy products. Denies complications with sedation or anesthesia. Denies O2 use. Denies use of diet or weight loss medications.  Emmi instructions not given for colonoscopy, pt does not have access to the internet.

## 2014-02-12 ENCOUNTER — Other Ambulatory Visit: Payer: Self-pay

## 2014-02-12 ENCOUNTER — Ambulatory Visit (AMBULATORY_SURGERY_CENTER): Payer: Medicare Other | Admitting: Internal Medicine

## 2014-02-12 ENCOUNTER — Other Ambulatory Visit (INDEPENDENT_AMBULATORY_CARE_PROVIDER_SITE_OTHER): Payer: Medicare Other

## 2014-02-12 ENCOUNTER — Encounter: Payer: Self-pay | Admitting: Internal Medicine

## 2014-02-12 VITALS — BP 158/73 | HR 70 | Temp 98.4°F | Resp 20 | Ht 69.5 in | Wt 180.0 lb

## 2014-02-12 DIAGNOSIS — K639 Disease of intestine, unspecified: Secondary | ICD-10-CM

## 2014-02-12 DIAGNOSIS — I10 Essential (primary) hypertension: Secondary | ICD-10-CM | POA: Diagnosis not present

## 2014-02-12 DIAGNOSIS — D121 Benign neoplasm of appendix: Secondary | ICD-10-CM

## 2014-02-12 DIAGNOSIS — Z1211 Encounter for screening for malignant neoplasm of colon: Secondary | ICD-10-CM | POA: Diagnosis not present

## 2014-02-12 DIAGNOSIS — D123 Benign neoplasm of transverse colon: Secondary | ICD-10-CM

## 2014-02-12 DIAGNOSIS — D120 Benign neoplasm of cecum: Secondary | ICD-10-CM

## 2014-02-12 DIAGNOSIS — Z86010 Personal history of colonic polyps: Secondary | ICD-10-CM

## 2014-02-12 LAB — CREATININE, SERUM: CREATININE: 0.51 mg/dL (ref 0.40–1.20)

## 2014-02-12 LAB — BUN: BUN: 7 mg/dL (ref 6–23)

## 2014-02-12 MED ORDER — SODIUM CHLORIDE 0.9 % IV SOLN: 500.0000 mL | INTRAVENOUS | Status: DC

## 2014-02-12 NOTE — Progress Notes (Signed)
Contrast given to patient with instructions. Informed her that office would be calling with times to fill in for NPO and to drink contrast. Pt requested to do CT Scan at [facility] if possible. Sherry informed. Sent to lab for BUN and Creatinine.

## 2014-02-12 NOTE — Progress Notes (Signed)
Called to room to assist during endoscopic procedure.  Patient ID and intended procedure confirmed with present staff. Received instructions for my participation in the procedure from the performing physician.  

## 2014-02-12 NOTE — Progress Notes (Signed)
Stable to RR 

## 2014-02-12 NOTE — Patient Instructions (Signed)
Impressions/recommendations:  Polyp (handout given)  Submucosal bulge at appendiceal orifice  CT Scan Repeat colonoscopy pending pathology results.  YOU HAD AN ENDOSCOPIC PROCEDURE TODAY AT [facility]: Refer to the procedure report that was given to you for any specific questions about what was found during the examination.  If the procedure report does not answer your questions, please call your gastroenterologist to clarify.  If you requested that your care partner not be given the details of your procedure findings, then the procedure report has been included in a sealed envelope for you to review at your convenience later.  YOU SHOULD EXPECT: Some feelings of bloating in the abdomen. Passage of more gas than usual.  Walking can help get rid of the air that was put into your GI tract during the procedure and reduce the bloating. If you had a lower endoscopy (such as a colonoscopy or flexible sigmoidoscopy) you may notice spotting of blood in your stool or on the toilet paper. If you underwent a bowel prep for your procedure, then you may not have a normal bowel movement for a few days.  DIET: Your first meal following the procedure should be a light meal and then it is ok to progress to your normal diet.  A half-sandwich or bowl of soup is an example of a good first meal.  Heavy or fried foods are harder to digest and may make you feel nauseous or bloated.  Likewise meals heavy in dairy and vegetables can cause extra gas to form and this can also increase the bloating.  Drink plenty of fluids but you should avoid alcoholic beverages for 24 hours.  ACTIVITY: Your care partner should take you home directly after the procedure.  You should plan to take it easy, moving slowly for the rest of the day.  You can resume normal activity the day after the procedure however you should NOT DRIVE or use heavy machinery for 24 hours (because of the sedation medicines used during the test).     SYMPTOMS TO REPORT IMMEDIATELY: A gastroenterologist can be reached at any hour.  During normal business hours, 8:30 AM to 5:00 PM Monday through Friday, call [phone].  After hours and on weekends, please call the GI answering service at [phone] who will take a message and have the physician on call contact you.   Following lower endoscopy (colonoscopy or flexible sigmoidoscopy):  Excessive amounts of blood in the stool  Significant tenderness or worsening of abdominal pains  Swelling of the abdomen that is new, acute  Fever of 100F or higher  FOLLOW UP: If any biopsies were taken you will be contacted by phone or by letter within the next 1-3 weeks.  Call your gastroenterologist if you have not heard about the biopsies in 3 weeks.  Our staff will call the home number listed on your records the next business day following your procedure to check on you and address any questions or concerns that you may have at that time regarding the information given to you following your procedure. This is a courtesy call and so if there is no answer at the home number and we have not heard from you through the emergency physician on call, we will assume that you have returned to your regular daily activities without incident.  SIGNATURES/CONFIDENTIALITY: You and/or your care partner have signed paperwork which will be entered into your electronic medical record.  These signatures attest to the fact that that the information  above on your After Visit Summary has been reviewed and is understood.  Full responsibility of the confidentiality of this discharge information lies with you and/or your care-partner. 

## 2014-02-12 NOTE — Op Note (Signed)
[place]  [address] [place], [address]   COLONOSCOPY PROCEDURE REPORT  PATIENT: [name], [name]  MR#: [id number] BIRTHDATE: Jun 02, 1940 , 37  yrs. old GENDER: female ENDOSCOPIST: Jerene Bears, MD PROCEDURE DATE:  02/12/2014 PROCEDURE:   Colonoscopy with biopsy and Colonoscopy with snare polypectomy First Screening Colonoscopy - Avg.  risk and is 50 yrs.  old or older - No.  Prior Negative Screening - Now for repeat screening. N/A  History of Adenoma - Now for follow-up colonoscopy & has been > or = to 3 yrs.  Yes hx of adenoma.  Has been 3 or more years since last colonoscopy.  Polyps Removed Today? Yes. ASA CLASS:   Class II INDICATIONS:high risk patient with personal history of colonic polyps. MEDICATIONS: Monitored anesthesia care, Propofol 450 mg IV, and lidocaine 40 mg IV  DESCRIPTION OF PROCEDURE:   After the risks benefits and alternatives of the procedure were thoroughly explained, informed consent was obtained.  The digital rectal exam revealed several skin tags.   The [id number]  endoscope was introduced through the anus and advanced to the cecum, which was identified by both the appendix and ileocecal valve. No adverse events experienced.   The quality of the prep was good, using MoviPrep The instrument was then slowly withdrawn as the colon was fully examined.   COLON FINDINGS: Submucosal bulge at the appendiceal orifice, query submucosal lesion or extrinsic compression.  Multiple biopsies obtained from the appendiceal orifice.   A sessile polyp measuring 5 mm in size was found at the hepatic flexure.  A polypectomy was performed with a cold snare.  The resection was complete, the polyp tissue was completely retrieved and sent to histology.   The examination was otherwise normal.  Retroflexed views revealed internal hemorrhoids. The time to cecum=4 minutes 06 seconds. Withdrawal time=13 minutes 46 seconds.  The scope was withdrawn  and the procedure completed. COMPLICATIONS: There were no immediate complications.  ENDOSCOPIC IMPRESSION: 1.   Submucosal bulge at the appendiceal orifice, query submucosal lesion or extrinsic compression.  Multiple biopsies obtained from the appendiceal orifice 2.   Sessile polyp was found at the hepatic flexure; polypectomy was performed with a cold snare 3.   The examination was otherwise normal  RECOMMENDATIONS: 1.  Await pathology results 2.  My office will arrange for you to have a CT scan of abdomen and pelvis to better examine the appendix. 3.  Timing of repeat colonoscopy will be determined by pathology findings. 4.  You will receive a letter within 1-2 weeks with the results of your biopsy as well as final recommendations.  Please call my office if you have not received a letter after 3 weeks.  eSigned:  Jerene Bears, MD 02/12/2014 9:52 AM cc: Redge Gainer, MD and The Patient

## 2014-02-12 NOTE — Progress Notes (Signed)
Patient is scheduled for a CT at [facility] at her request for CT abd/pelvis at 4:00 on 02/14/14.  She is advised to have no solids after 12:00 and drink one bottle of contrast at 2:00 and the other at 3:00.  She did not think she could make that date and time, I provided her the number to radiology scheduling at [facility] to reschedule at her convenience.

## 2014-02-13 ENCOUNTER — Telehealth: Payer: Self-pay | Admitting: *Deleted

## 2014-02-13 NOTE — Telephone Encounter (Signed)
  Follow up Call-  Call back number 02/12/2014  Post procedure Call Back phone  # [phone]  Permission to leave phone message Yes     Patient questions:  Do you have a fever, pain , or abdominal swelling? No. Pain Score  0 *  Have you tolerated food without any problems? Yes.    Have you been able to return to your normal activities? Yes.    Do you have any questions about your discharge instructions: Diet   No. Medications  No. Follow up visit  No.  Do you have questions or concerns about your Care? No.  Actions: * If pain score is 4 or above: No action needed, pain <4.

## 2014-02-14 ENCOUNTER — Other Ambulatory Visit (HOSPITAL_COMMUNITY): Payer: Medicare Other

## 2014-02-15 ENCOUNTER — Ambulatory Visit (HOSPITAL_COMMUNITY)
Admission: RE | Admit: 2014-02-15 | Discharge: 2014-02-15 | Disposition: A | Discharge status: Home / Self Care | Payer: Medicare Other | Source: Ambulatory Visit | Attending: Internal Medicine | Admitting: Internal Medicine

## 2014-02-15 DIAGNOSIS — Z9221 Personal history of antineoplastic chemotherapy: Secondary | ICD-10-CM | POA: Diagnosis not present

## 2014-02-15 DIAGNOSIS — Z853 Personal history of malignant neoplasm of breast: Secondary | ICD-10-CM | POA: Diagnosis not present

## 2014-02-15 DIAGNOSIS — I700 Atherosclerosis of aorta: Secondary | ICD-10-CM | POA: Diagnosis not present

## 2014-02-15 DIAGNOSIS — R109 Unspecified abdominal pain: Secondary | ICD-10-CM | POA: Insufficient documentation

## 2014-02-15 DIAGNOSIS — Z923 Personal history of irradiation: Secondary | ICD-10-CM | POA: Diagnosis not present

## 2014-02-15 DIAGNOSIS — R932 Abnormal findings on diagnostic imaging of liver and biliary tract: Secondary | ICD-10-CM | POA: Insufficient documentation

## 2014-02-15 DIAGNOSIS — K388 Other specified diseases of appendix: Secondary | ICD-10-CM | POA: Diagnosis not present

## 2014-02-15 DIAGNOSIS — K639 Disease of intestine, unspecified: Secondary | ICD-10-CM

## 2014-02-15 MED ORDER — IOHEXOL 300 MG/ML  SOLN
100.0000 mL | Freq: Once | INTRAMUSCULAR | Status: AC | PRN
  Administered 2014-02-15: 100 mL via INTRAVENOUS

## 2014-02-19 ENCOUNTER — Encounter: Payer: Self-pay | Admitting: Internal Medicine

## 2014-02-20 ENCOUNTER — Other Ambulatory Visit (INDEPENDENT_AMBULATORY_CARE_PROVIDER_SITE_OTHER): Payer: Self-pay | Admitting: Surgery

## 2014-02-20 DIAGNOSIS — K388 Other specified diseases of appendix: Secondary | ICD-10-CM | POA: Diagnosis not present

## 2014-02-20 NOTE — Progress Notes (Signed)
There is no information regarding this note. Isn't something I need to know about this patient other than understand that she is seeing the surgeon for abdominal surgery.

## 2014-02-20 NOTE — H&P (Addendum)
[name] 02/20/2014 10:29 AM Location: [facility] Patient #: [id number] DOB: [birth year] Divorced / Language: Cleophus Molt / Race: White Female History of Present Illness Adin Hector MD; 02/20/2014 11:01 AM) Patient words: append.  The patient is a 74 year old female who presents with an abdominal mass. Patient sent by her gastroenterologist Dr. Kathe Becton over concerns of mucous seal the appendix. Pleasant active woman. History of colon polyps. Underwent follow-up colonoscopy. Tubular adenoma noted at hepatic flexure. Bulging seen at the appendiceal orifice. Biopsies not consistent with adenomatous tissue. CT scan shows swelling of the appendix suspicious for mucocele. Surgical consultation requested consider appendectomy. Patient comes today with her friend. She normally moves her bowels about every other day. Takes Senokot. Has some intermittent abdominal cramping and bloating but not severe. Been going on for decades. No bleeding. Can walk a half hour without difficulty. She had a tubal ligation but no other surgeries. No problems with eating certain foods. Some heartburn and reflux. Usually well controlled. Question of gallbladder sludge on CAT scan the past but denies any episodes of biliary colic to my history. No real nausea or vomiting. Mild fullness but no severe early satiety. Other Problems Elbert Ewings, CMA; 02/20/2014 10:29 AM) Breast Cancer Cholelithiasis Gastroesophageal Reflux Disease Hemorrhoids Sleep Apnea Thyroid Disease  Past Surgical History Elbert Ewings, CMA; 02/20/2014 10:29 AM) Cataract Surgery Left. Colon Polyp Removal - Colonoscopy Foot Surgery Left. Mastectomy Left. Thyroid Surgery  Diagnostic Studies History Elbert Ewings, [place]; 02/20/2014 10:29 AM) Colonoscopy within last year Mammogram within last year Pap Smear 1-5 years ago  Allergies Elbert Ewings, CMA; 02/20/2014 10:31 AM) Sulfa 10 *OPHTHALMIC  AGENTS*  Medication History Elbert Ewings, CMA; 02/20/2014 10:35 AM) Aspirin (81MG  Tablet, Oral) Active. Calcium Ascorbate (500MG  Tablet, Oral) Active. Donepezil HCl (10MG  Tablet Disperse, Oral) Active. Fish Oil + D3 (1000-1000MG -UNIT Capsule, Oral) Active. Gabapentin (300MG  Capsule, Oral) Active. Ibuprofen IB (200MG  Tablet, Oral) Active. Lansoprazole (15MG  Capsule DR, Oral) Active. Levothyroxine Sodium (125MCG Tablet, Oral) Active. Lisinopril-Hydrochlorothiazide (10-12.5MG  Tablet, Oral) Active. Multi Vitamin Daily (Oral) Active. Nortriptyline HCl (25MG  Capsule, Oral) Active. Senokot S (8.6-50MG  Tablet, Oral) Active. Vitamin B-12 (2000MCG Tablet, Oral) Active.  Social History Elbert Ewings, [place]; 02/20/2014 10:29 AM) Caffeine use Coffee. No alcohol use No drug use Tobacco use Never smoker.  Family History Elbert Ewings, [place]; 02/20/2014 10:29 AM) Family history unknown First Degree Relatives Heart disease in female family member before age 47  Pregnancy / Birth History Elbert Ewings, [place]; 02/20/2014 10:29 AM) Age at menarche 40 years. Age of menopause 11-55 Gravida 2 Maternal age 37-25 Para 2     Review of Systems Elbert Ewings CMA; 02/20/2014 10:29 AM) General Present- Fatigue. Not Present- Appetite Loss, Chills, Fever, Night Sweats, Weight Gain and Weight Loss. HEENT Present- Hoarseness. Not Present- Earache, Hearing Loss, Nose Bleed, Oral Ulcers, Ringing in the Ears, Seasonal Allergies, Sinus Pain, Sore Throat, Visual Disturbances, Wears glasses/contact lenses and Yellow Eyes. Cardiovascular Present- Leg Cramps. Not Present- Chest Pain, Difficulty Breathing Lying Down, Palpitations, Rapid Heart Rate, Shortness of Breath and Swelling of Extremities. Gastrointestinal Present- Hemorrhoids. Not Present- Abdominal Pain, Bloating, Bloody Stool, Change in Bowel Habits, Chronic diarrhea, Constipation, Difficulty Swallowing, Excessive gas, Gets full quickly at meals,  Indigestion, Nausea, Rectal Pain and Vomiting. Musculoskeletal Not Present- Back Pain, Joint Pain, Joint Stiffness, Muscle Pain, Muscle Weakness and Swelling of Extremities. Neurological Not Present- Decreased Memory, Fainting, Headaches, Numbness, Seizures, Tingling, Tremor, Trouble walking and Weakness. Psychiatric Not Present- Anxiety, Bipolar, Change in Sleep Pattern, Depression,  Fearful and Frequent crying. Endocrine Not Present- Cold Intolerance, Excessive Hunger, Hair Changes, Heat Intolerance, Hot flashes and New Diabetes. Hematology Not Present- Easy Bruising, Excessive bleeding, Gland problems, HIV and Persistent Infections.  Vitals Elbert Ewings CMA; 02/20/2014 10:36 AM) 02/20/2014 10:35 AM Weight: 179.8 lb Height: 69in Body Surface Area: 1.99 m Body Mass Index: 26.55 kg/m Temp.: 96.59F(Temporal)  Pulse: 60 (Regular)  Resp.: 16 (Unlabored)  BP: 122/60 (Sitting, Left Arm, Standard)     Physical Exam Adin Hector MD; 02/20/2014 11:02 AM)  General Mental Status-Alert. General Appearance-Not in acute distress, Not Sickly. Orientation-Oriented X3. Hydration-Well hydrated. Voice-Normal.  Integumentary Global Assessment Upon inspection and palpation of skin surfaces of the - Axillae: non-tender, no inflammation or ulceration, no drainage. and Distribution of scalp and body hair is normal. General Characteristics Temperature - normal warmth is noted.  Head and Neck Head-normocephalic, atraumatic with no lesions or palpable masses. Face Global Assessment - atraumatic, no absence of expression. Neck Global Assessment - no abnormal movements, no bruit auscultated on the right, no bruit auscultated on the left, no decreased range of motion, non-tender. Trachea-midline. Thyroid Gland Characteristics - non-tender.  Eye Eyeball - Left-Extraocular movements intact, No Nystagmus. Eyeball - Right-Extraocular movements intact, No  Nystagmus. Cornea - Left-No Hazy. Cornea - Right-No Hazy. Sclera/Conjunctiva - Left-No scleral icterus, No Discharge. Sclera/Conjunctiva - Right-No scleral icterus, No Discharge. Pupil - Left-Direct reaction to light normal. Pupil - Right-Direct reaction to light normal.  ENMT Ears Pinna - Left - no drainage observed, no generalized tenderness observed. Right - no drainage observed, no generalized tenderness observed. Nose and Sinuses External Inspection of the Nose - no destructive lesion observed. Inspection of the nares - Left - quiet respiration. Right - quiet respiration. Mouth and Throat Lips - Upper Lip - no fissures observed, no pallor noted. Lower Lip - no fissures observed, no pallor noted. Nasopharynx - no discharge present. Oral Cavity/Oropharynx - Tongue - no dryness observed. Oral Mucosa - no cyanosis observed. Hypopharynx - no evidence of airway distress observed.  Chest and Lung Exam Inspection Movements - Normal and Symmetrical. Accessory muscles - No use of accessory muscles in breathing. Palpation Palpation of the chest reveals - Non-tender. Auscultation Breath sounds - Normal and Clear.  Cardiovascular Auscultation Rhythm - Regular. Murmurs & Other Heart Sounds - Auscultation of the heart reveals - No Murmurs and No Systolic Clicks.  Abdomen Inspection Inspection of the abdomen reveals - No Visible peristalsis and No Abnormal pulsations. Umbilicus - No Bleeding, No Urine drainage. Palpation/Percussion Palpation and Percussion of the abdomen reveal - Soft, Non Tender, No Rebound tenderness, No Rigidity (guarding) and No Cutaneous hyperesthesia. Note: Slightly overweight but soft. Mild abdominal discomfort bilateral lower quadrants. No guarding. No rebound tenderness. Infraumbilical incision well healed. No hernias. No Murphy sign.   Female Genitourinary Sexual Maturity Tanner 5 - Adult hair pattern. Note: No vaginal bleeding nor  discharge   Peripheral Vascular Upper Extremity Inspection - Left - No Cyanotic nailbeds, Not Ischemic. Right - No Cyanotic nailbeds, Not Ischemic.  Neurologic Neurologic evaluation reveals -normal attention span and ability to concentrate, able to name objects and repeat phrases. Appropriate fund of knowledge , normal sensation and normal coordination. Mental Status Affect - not angry, not paranoid. Cranial Nerves-Normal Bilaterally. Gait-Normal.  Neuropsychiatric Mental status exam performed with findings of-able to articulate well with normal speech/language, rate, volume and coherence, thought content normal with ability to perform basic computations and apply abstract reasoning and no evidence of hallucinations, delusions, obsessions or  homicidal/suicidal ideation.  Musculoskeletal Global Assessment Spine, Ribs and Pelvis - no instability, subluxation or laxity. Right Upper Extremity - no instability, subluxation or laxity.  Lymphatic Head & Neck  General Head & Neck Lymphatics: Bilateral - Description - No Localized lymphadenopathy. Axillary  General Axillary Region: Bilateral - Description - No Localized lymphadenopathy. Femoral & Inguinal  Generalized Femoral & Inguinal Lymphatics: Left - Description - No Localized lymphadenopathy. Right - Description - No Localized lymphadenopathy.    Assessment & Plan Adin Hector MD; 02/20/2014 10:55 AM)  MUCOCELE, APPENDIX (543.9  K38.8) Impression: Incidentally noted on colonoscopy. I think she would benefit from laparoscopic cholecystectomy with partial sacrectomy. Hopefully could be outpatient procedure. Ileocecectomy less likely and less difficult to get good margins. Reasonable laparoscopic candidate. Patient on her friend had questions. There were answered. She agrees to proceed.  The anatomy & physiology of the digestive tract was discussed.  The pathophysiology of appendicitis and other appendiceal disorders were  discussed.  Natural history risks without surgery was discussed.   I feel the risks of no intervention will lead to serious problems that outweigh the operative risks; therefore, I recommended diagnostic laparoscopy with removal of appendix to remove the pathology.  Laparoscopic & open techniques were discussed.   I noted a good likelihood this will help address the problem.   Risks such as bleeding, infection, abscess, leak, reoperation, possible ostomy, hernia, heart attack, stroke, death, and other risks were discussed.  Goals of post-operative recovery were discussed as well.  We will work to minimize complications.  Questions were answered.  The patient expresses understanding & wishes to proceed with surgery.   Current Plans Schedule for Surgery Pt Education - [facility] Laparosopic Post Op HCI (Derick Seminara) Pt Education - [facility] Good Bowel Health (Lively Haberman) Pt Education - [facility] Pain Control (Xayden Linsey) Instructions:  Laparoscopic Appendectomy Appendectomy is surgery to remove the appendix. Laparoscopic surgery uses several small cuts (incisions) instead of one large incision. Laparoscopic surgery offers a shorter recovery time and less discomfort. LET YOUR CAREGIVER KNOW ABOUT:  Allergies to food or medicine.  Medicines taken, including vitamins, dietary supplements, herbs, eyedrops, over-the-counter medicines, and creams.  Use of steroids (by mouth or creams).  Previous problems with anesthetics or numbing medicines.  History of bleeding problems or blood clots.  Previous surgery.  Other health problems, including diabetes, heart problems, lung problems, and kidney problems.  Possibility of pregnancy, if this applies. RISKS AND COMPLICATIONS  Infection. A germ starts growing in the wound. This can usually be treated with antibiotics. In some cases, the wound will need to be opened and cleaned.  Bleeding.  Damage to other organs.  Sores (abscesses).  Chronic pain at the incision sites. This is  defined as pain that lasts for more than 3 months.  Blood clots in the legs that may rarely travel to the lungs.  Infection in the lungs (pneumonia). BEFORE THE PROCEDURE Appendectomy is usually performed immediately after an inflamed appendix (appendicitis) is diagnosed. No preparation is necessary ahead of this procedure. PROCEDURE You will be given medicine that makes you sleep (general anesthetic). After you are asleep, a flexible tube (catheter) may be inserted into your bladder to drain your urine during surgery. The tube is removed before you wake up after surgery. When you are asleep, carbon dioxide gas will be used to inflate your abdomen. This will allow your surgeon to see inside your abdomen and perform your surgery. Three small incisions will be made in your abdomen. Your surgeon will  insert a thin, lighted tube (laparoscope) through one of the incisions. Your surgeon will look through the laparoscope while performing the surgery. Other tools will be inserted through the other incisions. Laparoscopic procedures may not be appropriate when:  There is major scarring from a previous surgery.  The patient has bleeding disorders.  A pregnancy is near term.  There are other conditions which make the laparoscopic procedure impossible, such as an advanced infection or a ruptured appendix. If your surgeon feels it is not safe to continue with the laparoscopic procedure, he or she will perform an open surgery instead. This gives the surgeon a larger view and more space to work. Open surgery requires a longer recovery time. After your appendix is removed, your incisions will be closed with stitches (sutures) or skin adhesive. AFTER THE PROCEDURE You will be taken to a recovery room. When the anesthesia has worn off, you will be returned to your hospital room. You will be given pain medicines to keep you comfortable. Ask your caregiver how long your hospital stay will be. Document Released:  08/05/2003 Document Revised: 03/15/2011 Document Reviewed: 06/30/2010 [facility] Patient Information 2015 [place], [place]. This information is not intended to replace advice given to you by your health care provider. Make sure you discuss any questions you have with your health care provider  Adin Hector, M.D., F.A.C.S. Gastrointestinal and Minimally Invasive Surgery [facility] [address], [place], [place] [address] [phone] Main / Paging

## 2014-02-22 ENCOUNTER — Other Ambulatory Visit: Payer: Self-pay | Admitting: Family Medicine

## 2014-02-26 ENCOUNTER — Other Ambulatory Visit (HOSPITAL_COMMUNITY): Payer: Self-pay | Admitting: *Deleted

## 2014-02-26 NOTE — Patient Instructions (Addendum)
[name]  02/26/2014   Your procedure is scheduled on: Thursday February 25th, 2016  Report to [facility] Main  Entrance and follow signs to               [place] at 1015 AM.  Call this number if you have problems the morning of surgery [phone]   Remember:  Do not eat food or drink liquids :After Midnight.     Take these medicines the morning of surgery with A SIP OF WATER: Synthroid                                You may not have any metal on your body including hair pins and              piercings  Do not wear jewelry, make-up, lotions, powders or perfumes., deodorant.               Do not wear nail polish.  Do not shave  48 hours prior to surgery.                Do not bring valuables to the hospital. [place].  Contacts, dentures or bridgework may not be worn into surgery.  Leave suitcase in the car. After surgery it may be brought to your room.     Patients discharged the day of surgery will not be allowed to drive home.  Name and phone number of your driver:  Special Instructions: coughing and deep breathing exercises, leg exercises               Please read over the following fact sheets you were given: _____________________________________________________________________             [facility] - Preparing for Surgery Before surgery, you can play an important role.  Because skin is not sterile, your skin needs to be as free of germs as possible.  You can reduce the number of germs on your skin by washing with CHG (chlorahexidine gluconate) soap before surgery.  CHG is an antiseptic cleaner which kills germs and bonds with the skin to continue killing germs even after washing. Please DO NOT use if you have an allergy to CHG or antibacterial soaps.  If your skin becomes reddened/irritated stop using the CHG and inform your nurse when you arrive at Short Stay. Do not shave  (including legs and underarms) for at least 48 hours prior to the first CHG shower.  You may shave your face/neck. Please follow these instructions carefully:  1.  Shower with CHG Soap the night before surgery and the  morning of Surgery.  2.  If you choose to wash your hair, wash your hair first as usual with your  normal  shampoo.  3.  After you shampoo, rinse your hair and body thoroughly to remove the  shampoo.                           4.  Use CHG as you would any other liquid soap.  You can apply chg directly  to the skin and wash  Gently with a scrungie or clean washcloth.  5.  Apply the CHG Soap to your body ONLY FROM THE NECK DOWN.   Do not use on face/ open                           Wound or open sores. Avoid contact with eyes, ears mouth and genitals (private parts).                       Wash face,  Genitals (private parts) with your normal soap.             6.  Wash thoroughly, paying special attention to the area where your surgery  will be performed.  7.  Thoroughly rinse your body with warm water from the neck down.  8.  DO NOT shower/wash with your normal soap after using and rinsing off  the CHG Soap.                9.  Pat yourself dry with a clean towel.            10.  Wear clean pajamas.            11.  Place clean sheets on your bed the night of your first shower and do not  sleep with pets. Day of Surgery : Do not apply any lotions/deodorants the morning of surgery.  Please wear clean clothes to the hospital/surgery center.  FAILURE TO FOLLOW THESE INSTRUCTIONS MAY RESULT IN THE CANCELLATION OF YOUR SURGERY PATIENT SIGNATURE_________________________________  NURSE SIGNATURE__________________________________  ________________________________________________________________________

## 2014-02-27 ENCOUNTER — Encounter (HOSPITAL_COMMUNITY): Payer: Self-pay

## 2014-02-27 ENCOUNTER — Encounter (HOSPITAL_COMMUNITY)
Admission: RE | Admit: 2014-02-27 | Discharge: 2014-02-27 | Disposition: A | Discharge status: Home / Self Care | Payer: Medicare Other | Source: Ambulatory Visit | Attending: Surgery | Admitting: Surgery

## 2014-02-27 DIAGNOSIS — I10 Essential (primary) hypertension: Secondary | ICD-10-CM | POA: Diagnosis not present

## 2014-02-27 DIAGNOSIS — F419 Anxiety disorder, unspecified: Secondary | ICD-10-CM | POA: Diagnosis not present

## 2014-02-27 DIAGNOSIS — K219 Gastro-esophageal reflux disease without esophagitis: Secondary | ICD-10-CM | POA: Diagnosis not present

## 2014-02-27 DIAGNOSIS — K388 Other specified diseases of appendix: Secondary | ICD-10-CM | POA: Diagnosis not present

## 2014-02-27 DIAGNOSIS — K660 Peritoneal adhesions (postprocedural) (postinfection): Secondary | ICD-10-CM | POA: Diagnosis not present

## 2014-02-27 DIAGNOSIS — Z7982 Long term (current) use of aspirin: Secondary | ICD-10-CM | POA: Diagnosis not present

## 2014-02-27 DIAGNOSIS — G4730 Sleep apnea, unspecified: Secondary | ICD-10-CM | POA: Diagnosis not present

## 2014-02-27 DIAGNOSIS — E039 Hypothyroidism, unspecified: Secondary | ICD-10-CM | POA: Diagnosis not present

## 2014-02-27 HISTORY — DX: Unspecified osteoarthritis, unspecified site: M19.90

## 2014-02-27 HISTORY — DX: Essential (primary) hypertension: I10

## 2014-02-27 HISTORY — DX: Sleep apnea, unspecified: G47.30

## 2014-02-27 LAB — CBC
HCT: 40.2 % (ref 36.0–46.0)
Hemoglobin: 13.2 g/dL (ref 12.0–15.0)
MCH: 30.4 pg (ref 26.0–34.0)
MCHC: 32.8 g/dL (ref 30.0–36.0)
MCV: 92.6 fL (ref 78.0–100.0)
PLATELETS: 252 10*3/uL (ref 150–400)
RBC: 4.34 MIL/uL (ref 3.87–5.11)
RDW: 14.3 % (ref 11.5–15.5)
WBC: 6.2 10*3/uL (ref 4.0–10.5)

## 2014-02-27 LAB — BASIC METABOLIC PANEL
Anion gap: 9 (ref 5–15)
BUN: 11 mg/dL (ref 6–23)
CALCIUM: 9.6 mg/dL (ref 8.4–10.5)
CHLORIDE: 102 mmol/L (ref 96–112)
CO2: 29 mmol/L (ref 19–32)
CREATININE: 0.5 mg/dL (ref 0.50–1.10)
Glucose, Bld: 94 mg/dL (ref 70–99)
Potassium: 4 mmol/L (ref 3.5–5.1)
Sodium: 140 mmol/L (ref 135–145)

## 2014-02-27 NOTE — Progress Notes (Signed)
07/09/13 CXR in [facility]  EKG- 04/04/13 [facility]  Holter monitor- 06/2013 [facility]  LOV- with Neurology- 01/24/13 in [facility]  05/30/2013- LOV with Dr Percival Spanish in [place].

## 2014-02-27 NOTE — Progress Notes (Signed)
Patient aware to bring CPAP mask and tubing day of surgery.

## 2014-02-27 NOTE — Progress Notes (Signed)
Patient complained of right leg pain at preop appointment and right leg slightly swollen.  Patient states she has not reported to any physicians.  Pain in right groin and thigh area.  Pedal pulse present.  Right calf- 20inch circumference.  Left thigh- 19 1/4 inches in circumference.  .  Calf circumferences equal.  Instructed patient to inform office of Dr Johney [place].

## 2014-02-27 NOTE — Progress Notes (Signed)
Informed office of Dr Johney [place] of patient's right pain pain from groin to thigh..  Positive pulse in right foot .  Right thigh slight larger than left.  They will let him be aware.

## 2014-02-28 ENCOUNTER — Ambulatory Visit (HOSPITAL_COMMUNITY): Payer: Medicare Other | Admitting: Anesthesiology

## 2014-02-28 ENCOUNTER — Encounter (HOSPITAL_COMMUNITY): Payer: Self-pay | Admitting: *Deleted

## 2014-02-28 ENCOUNTER — Encounter (HOSPITAL_COMMUNITY)
Admission: RE | Disposition: A | Discharge status: Home / Self Care | Payer: Self-pay | Source: Ambulatory Visit | Attending: Surgery

## 2014-02-28 ENCOUNTER — Ambulatory Visit (HOSPITAL_COMMUNITY)
Admission: RE | Admit: 2014-02-28 | Discharge: 2014-02-28 | Disposition: A | Discharge status: Home / Self Care | Payer: Medicare Other | Source: Ambulatory Visit | Attending: Surgery | Admitting: Surgery

## 2014-02-28 DIAGNOSIS — K388 Other specified diseases of appendix: Secondary | ICD-10-CM | POA: Insufficient documentation

## 2014-02-28 DIAGNOSIS — G4730 Sleep apnea, unspecified: Secondary | ICD-10-CM | POA: Insufficient documentation

## 2014-02-28 DIAGNOSIS — E039 Hypothyroidism, unspecified: Secondary | ICD-10-CM | POA: Diagnosis not present

## 2014-02-28 DIAGNOSIS — M1990 Unspecified osteoarthritis, unspecified site: Secondary | ICD-10-CM | POA: Diagnosis not present

## 2014-02-28 DIAGNOSIS — K660 Peritoneal adhesions (postprocedural) (postinfection): Secondary | ICD-10-CM | POA: Insufficient documentation

## 2014-02-28 DIAGNOSIS — I10 Essential (primary) hypertension: Secondary | ICD-10-CM | POA: Diagnosis not present

## 2014-02-28 DIAGNOSIS — Z7982 Long term (current) use of aspirin: Secondary | ICD-10-CM | POA: Diagnosis not present

## 2014-02-28 DIAGNOSIS — F419 Anxiety disorder, unspecified: Secondary | ICD-10-CM | POA: Insufficient documentation

## 2014-02-28 DIAGNOSIS — K219 Gastro-esophageal reflux disease without esophagitis: Secondary | ICD-10-CM | POA: Insufficient documentation

## 2014-02-28 HISTORY — PX: LAPAROSCOPIC APPENDECTOMY: SHX408

## 2014-02-28 SURGERY — APPENDECTOMY, LAPAROSCOPIC
Anesthesia: General | Site: Abdomen

## 2014-02-28 MED ORDER — HYDROMORPHONE HCL 1 MG/ML IJ SOLN: 0.2500 mg | INTRAMUSCULAR | Status: DC | PRN

## 2014-02-28 MED ORDER — ONDANSETRON HCL 4 MG/2ML IJ SOLN
4.0000 mg | INTRAMUSCULAR | Status: AC
  Administered 2014-02-28: 4 mg via INTRAVENOUS
  Filled 2014-02-28: qty 2

## 2014-02-28 MED ORDER — FENTANYL CITRATE 0.05 MG/ML IJ SOLN
INTRAMUSCULAR | Status: DC | PRN
  Administered 2014-02-28: 50 ug via INTRAVENOUS
  Administered 2014-02-28: 100 ug via INTRAVENOUS

## 2014-02-28 MED ORDER — KETOROLAC TROMETHAMINE 30 MG/ML IJ SOLN
INTRAMUSCULAR | Status: DC | PRN
  Administered 2014-02-28: 30 mg via INTRAVENOUS

## 2014-02-28 MED ORDER — LIDOCAINE HCL (PF) 2 % IJ SOLN
INTRAMUSCULAR | Status: DC | PRN
  Administered 2014-02-28: 50 mg via INTRADERMAL

## 2014-02-28 MED ORDER — ROCURONIUM BROMIDE 100 MG/10ML IV SOLN
INTRAVENOUS | Status: DC | PRN
  Administered 2014-02-28: 25 mg via INTRAVENOUS
  Administered 2014-02-28: 5 mg via INTRAVENOUS

## 2014-02-28 MED ORDER — CEFOTETAN DISODIUM-DEXTROSE 2-2.08 GM-% IV SOLR
INTRAVENOUS | Status: AC
  Filled 2014-02-28: qty 50

## 2014-02-28 MED ORDER — BUPIVACAINE-EPINEPHRINE (PF) 0.25% -1:200000 IJ SOLN
INTRAMUSCULAR | Status: AC
  Filled 2014-02-28: qty 60

## 2014-02-28 MED ORDER — SUCCINYLCHOLINE CHLORIDE 20 MG/ML IJ SOLN
INTRAMUSCULAR | Status: DC | PRN
  Administered 2014-02-28: 100 mg via INTRAVENOUS

## 2014-02-28 MED ORDER — ONDANSETRON HCL 4 MG/2ML IJ SOLN
INTRAMUSCULAR | Status: AC
  Filled 2014-02-28: qty 2

## 2014-02-28 MED ORDER — CEFOTETAN DISODIUM-DEXTROSE 2-2.08 GM-% IV SOLR
2.0000 g | INTRAVENOUS | Status: AC
  Administered 2014-02-28: 2 g via INTRAVENOUS

## 2014-02-28 MED ORDER — STERILE WATER FOR IRRIGATION IR SOLN
Status: DC | PRN
  Administered 2014-02-28: 1000 mL

## 2014-02-28 MED ORDER — CHLORHEXIDINE GLUCONATE 4 % EX LIQD: 1.0000 "application " | Freq: Once | CUTANEOUS | Status: DC

## 2014-02-28 MED ORDER — FENTANYL CITRATE 0.05 MG/ML IJ SOLN
INTRAMUSCULAR | Status: AC
  Filled 2014-02-28: qty 5

## 2014-02-28 MED ORDER — LACTATED RINGERS IV SOLN
INTRAVENOUS | Status: DC
  Administered 2014-02-28: 12:00:00 via INTRAVENOUS
  Administered 2014-02-28: 1000 mL via INTRAVENOUS

## 2014-02-28 MED ORDER — EPHEDRINE SULFATE 50 MG/ML IJ SOLN
INTRAMUSCULAR | Status: AC
  Filled 2014-02-28: qty 1

## 2014-02-28 MED ORDER — PROPOFOL 10 MG/ML IV BOLUS
INTRAVENOUS | Status: DC | PRN
  Administered 2014-02-28: 130 mg via INTRAVENOUS

## 2014-02-28 MED ORDER — GLYCOPYRROLATE 0.2 MG/ML IJ SOLN
INTRAMUSCULAR | Status: AC
  Filled 2014-02-28: qty 2

## 2014-02-28 MED ORDER — ONDANSETRON HCL 4 MG/2ML IJ SOLN
INTRAMUSCULAR | Status: DC | PRN
  Administered 2014-02-28: 4 mg via INTRAVENOUS

## 2014-02-28 MED ORDER — DEXAMETHASONE SODIUM PHOSPHATE 10 MG/ML IJ SOLN
INTRAMUSCULAR | Status: AC
  Filled 2014-02-28: qty 1

## 2014-02-28 MED ORDER — OXYCODONE HCL 5 MG PO TABS: 5.0000 mg | ORAL_TABLET | ORAL | Status: DC | PRN

## 2014-02-28 MED ORDER — DEXAMETHASONE SODIUM PHOSPHATE 10 MG/ML IJ SOLN
INTRAMUSCULAR | Status: DC | PRN
  Administered 2014-02-28: 10 mg via INTRAVENOUS

## 2014-02-28 MED ORDER — LACTATED RINGERS IV SOLN: INTRAVENOUS | Status: DC

## 2014-02-28 MED ORDER — BUPIVACAINE-EPINEPHRINE 0.25% -1:200000 IJ SOLN
INTRAMUSCULAR | Status: DC | PRN
  Administered 2014-02-28: 60 mL

## 2014-02-28 MED ORDER — 0.9 % SODIUM CHLORIDE (POUR BTL) OPTIME
TOPICAL | Status: DC | PRN
  Administered 2014-02-28: 1000 mL

## 2014-02-28 MED ORDER — ROCURONIUM BROMIDE 100 MG/10ML IV SOLN
INTRAVENOUS | Status: AC
  Filled 2014-02-28: qty 1

## 2014-02-28 MED ORDER — KETOROLAC TROMETHAMINE 30 MG/ML IJ SOLN
INTRAMUSCULAR | Status: AC
  Filled 2014-02-28: qty 2

## 2014-02-28 MED ORDER — PROPOFOL 10 MG/ML IV BOLUS
INTRAVENOUS | Status: AC
  Filled 2014-02-28: qty 20

## 2014-02-28 MED ORDER — SODIUM CHLORIDE 0.9 % IJ SOLN
INTRAMUSCULAR | Status: AC
  Filled 2014-02-28: qty 10

## 2014-02-28 MED ORDER — GLYCOPYRROLATE 0.2 MG/ML IJ SOLN
INTRAMUSCULAR | Status: DC | PRN
  Administered 2014-02-28: 0.4 mg via INTRAVENOUS

## 2014-02-28 MED ORDER — NEOSTIGMINE METHYLSULFATE 10 MG/10ML IV SOLN
INTRAVENOUS | Status: AC
  Filled 2014-02-28: qty 1

## 2014-02-28 MED ORDER — NEOSTIGMINE METHYLSULFATE 10 MG/10ML IV SOLN
INTRAVENOUS | Status: DC | PRN
Start: 2014-02-28 — End: 2014-02-28
  Administered 2014-02-28: 3 mg via INTRAVENOUS

## 2014-02-28 SURGICAL SUPPLY — 42 items
APPLIER CLIP 5 13 M/L LIGAMAX5 (MISCELLANEOUS)
APPLIER CLIP ROT 10 11.4 M/L (STAPLE)
APR CLP MED LRG 11.4X10 (STAPLE)
APR CLP MED LRG 5 ANG JAW (MISCELLANEOUS)
BAG SPEC RTRVL LRG 6X4 10 (ENDOMECHANICALS) ×1
CLIP APPLIE 5 13 M/L LIGAMAX5 (MISCELLANEOUS) IMPLANT
CLIP APPLIE ROT 10 11.4 M/L (STAPLE) IMPLANT
CUTTER FLEX LINEAR 45M (STAPLE) IMPLANT
DECANTER SPIKE VIAL GLASS SM (MISCELLANEOUS) ×1 IMPLANT
DEVICE TROCAR PUNCTURE CLOSURE (ENDOMECHANICALS) IMPLANT
DRAPE LAPAROSCOPIC ABDOMINAL (DRAPES) ×2 IMPLANT
DRAPE WARM FLUID 44X44 (DRAPE) ×2 IMPLANT
DRSG TEGADERM 2-3/8X2-3/4 SM (GAUZE/BANDAGES/DRESSINGS) ×4 IMPLANT
DRSG TEGADERM 4X4.75 (GAUZE/BANDAGES/DRESSINGS) ×2 IMPLANT
ELECT REM PT RETURN 9FT ADLT (ELECTROSURGICAL) ×2
ELECTRODE REM PT RTRN 9FT ADLT (ELECTROSURGICAL) ×1 IMPLANT
ENDOLOOP SUT PDS II  0 18 (SUTURE)
ENDOLOOP SUT PDS II 0 18 (SUTURE) IMPLANT
GLOVE ECLIPSE 8.0 STRL XLNG CF (GLOVE) ×2 IMPLANT
GLOVE INDICATOR 8.0 STRL GRN (GLOVE) ×2 IMPLANT
GOWN STRL REUS W/TWL XL LVL3 (GOWN DISPOSABLE) ×6 IMPLANT
KIT BASIN OR (CUSTOM PROCEDURE TRAY) ×2 IMPLANT
PEN SKIN MARKING BROAD (MISCELLANEOUS) ×2 IMPLANT
POUCH SPECIMEN RETRIEVAL 10MM (ENDOMECHANICALS) ×1 IMPLANT
RELOAD 45 VASCULAR/THIN (ENDOMECHANICALS) IMPLANT
RELOAD STAPLE 45 2.5 WHT GRN (ENDOMECHANICALS) IMPLANT
RELOAD STAPLE 45 3.5 BLU ETS (ENDOMECHANICALS) IMPLANT
RELOAD STAPLE 60 3.6 BLU REG (STAPLE) IMPLANT
RELOAD STAPLE TA45 3.5 REG BLU (ENDOMECHANICALS) IMPLANT
RELOAD STAPLER BLUE 60MM (STAPLE) ×1 IMPLANT
SET IRRIG TUBING LAPAROSCOPIC (IRRIGATION / IRRIGATOR) ×2 IMPLANT
SHEARS HARMONIC ACE PLUS 36CM (ENDOMECHANICALS) IMPLANT
SLEEVE XCEL OPT CAN 5 100 (ENDOMECHANICALS) ×2 IMPLANT
STAPLE ECHEON FLEX 60 POW ENDO (STAPLE) ×1 IMPLANT
STAPLER RELOAD BLUE 60MM (STAPLE) ×2
SUT MNCRL AB 4-0 PS2 18 (SUTURE) ×2 IMPLANT
TOWEL OR 17X26 10 PK STRL BLUE (TOWEL DISPOSABLE) ×2 IMPLANT
TOWEL OR NON WOVEN STRL DISP B (DISPOSABLE) ×2 IMPLANT
TRAY FOLEY CATH 14FRSI W/METER (CATHETERS) ×1 IMPLANT
TRAY LAPAROSCOPIC (CUSTOM PROCEDURE TRAY) ×2 IMPLANT
TROCAR BLADELESS OPT 5 100 (ENDOMECHANICALS) ×2 IMPLANT
TROCAR XCEL 12X100 BLDLESS (ENDOMECHANICALS) ×2 IMPLANT

## 2014-02-28 NOTE — Anesthesia Preprocedure Evaluation (Addendum)
Anesthesia Evaluation  Patient identified by MRN, date of birth, ID band Patient awake    Reviewed: Allergy & Precautions, H&P , NPO status , Patient's Chart, lab work & pertinent test results  Airway Mallampati: III  TM Distance: >3 FB Neck ROM: full    Dental  (+) Edentulous Upper, Dental Advisory Given   Pulmonary sleep apnea and Continuous Positive Airway Pressure Ventilation ,  breath sounds clear to auscultation  Pulmonary exam normal       Cardiovascular Exercise Tolerance: Good hypertension, Pt. on medications Rhythm:regular Rate:Normal     Neuro/Psych Anxiety Memory problems. Polyneuropathy. Trigeminal neuralgia negative neurological ROS  negative psych ROS   GI/Hepatic Neg liver ROS, hiatal hernia, Esophageal stricture   Endo/Other  negative endocrine ROSHypothyroidism   Renal/GU negative Renal ROS  negative genitourinary   Musculoskeletal negative musculoskeletal ROS (+)   Abdominal   Peds negative pediatric ROS (+)  Hematology negative hematology ROS (+)   Anesthesia Other Findings   Reproductive/Obstetrics negative OB ROS                            Anesthesia Physical Anesthesia Plan  ASA: III  Anesthesia Plan: General   Post-op Pain Management:    Induction: Intravenous  Airway Management Planned: Oral ETT  Additional Equipment:   Intra-op Plan:   Post-operative Plan: Extubation in OR  Informed Consent: I have reviewed the patients History and Physical, chart, labs and discussed the procedure including the risks, benefits and alternatives for the proposed anesthesia with the patient or authorized representative who has indicated his/her understanding and acceptance.   Dental Advisory Given  Plan Discussed with: CRNA and Surgeon  Anesthesia Plan Comments:         Anesthesia Quick Evaluation

## 2014-02-28 NOTE — Anesthesia Postprocedure Evaluation (Signed)
  Anesthesia Post-op Note  Patient: [name]  Procedure(s) Performed: Procedure(s) (LRB): APPENDECTOMY LAPAROSCOPIC PARTIAL CECECTOMY FOR MUCOCELE OF APPENDIX  (N/A)  Patient Location: PACU  Anesthesia Type: General  Level of Consciousness: awake and alert   Airway and Oxygen Therapy: Patient Spontanous Breathing  Post-op Pain: mild  Post-op Assessment: Post-op Vital signs reviewed, Patient's Cardiovascular Status Stable, Respiratory Function Stable, Patent Airway and No signs of Nausea or vomiting  Last Vitals:  Filed Vitals:   02/28/14 1410  BP: 151/71  Pulse: 84  Temp: 36.3 C  Resp: 14    Post-op Vital Signs: stable   Complications: No apparent anesthesia complications

## 2014-02-28 NOTE — Discharge Instructions (Signed)
LAPAROSCOPIC SURGERY: POST OP INSTRUCTIONS ° °1. DIET: Follow a light bland diet the first 24 hours after arrival home, such as soup, liquids, crackers, etc.  Be sure to include lots of fluids daily.  Avoid fast food or heavy meals as your are more likely to get nauseated.  Eat a low fat the next few days after surgery.   °2. Take your usually prescribed home medications unless otherwise directed. °3. PAIN CONTROL: °a. Pain is best controlled by a usual combination of three different methods TOGETHER: °i. Ice/Heat °ii. Over the counter pain medication °iii. Prescription pain medication °b. Most patients will experience some swelling and bruising around the incisions.  Ice packs or heating pads (30-60 minutes up to 6 times a day) will help. Use ice for the first few days to help decrease swelling and bruising, then switch to heat to help relax tight/sore spots and speed recovery.  Some people prefer to use ice alone, heat alone, alternating between ice & heat.  Experiment to what works for you.  Swelling and bruising can take several weeks to resolve.   °c. It is helpful to take an over-the-counter pain medication regularly for the first few weeks.  Choose one of the following that works best for you: °i. Naproxen (Aleve, etc)  Two 220mg tabs twice a day °ii. Ibuprofen (Advil, etc) Three 200mg tabs four times a day (every meal & bedtime) °iii. Acetaminophen (Tylenol, etc) 500-650mg four times a day (every meal & bedtime) °d. A  prescription for pain medication (such as oxycodone, hydrocodone, etc) should be given to you upon discharge.  Take your pain medication as prescribed.  °i. If you are having problems/concerns with the prescription medicine (does not control pain, nausea, vomiting, rash, itching, etc), please call us [phone] to see if we need to switch you to a different pain medicine that will work better for you and/or control your side effect better. °ii. If you need a refill on your pain medication,  please contact your pharmacy.  They will contact our office to request authorization. Prescriptions will not be filled after 5 pm or on week-ends. °4. Avoid getting constipated.  Between the surgery and the pain medications, it is common to experience some constipation.  Increasing fluid intake and taking a fiber supplement (such as Metamucil, Citrucel, FiberCon, MiraLax, etc) 1-2 times a day regularly will usually help prevent this problem from occurring.  A mild laxative (prune juice, Milk of Magnesia, MiraLax, etc) should be taken according to package directions if there are no bowel movements after 48 hours.   °5. Watch out for diarrhea.  If you have many loose bowel movements, simplify your diet to bland foods & liquids for a few days.  Stop any stool softeners and decrease your fiber supplement.  Switching to mild anti-diarrheal medications (Kayopectate, Pepto Bismol) can help.  If this worsens or does not improve, please call us. °6. Wash / shower every day.  You may shower over the dressings as they are waterproof.  Continue to shower over incision(s) after the dressing is off. °7. Remove your waterproof bandages 5 days after surgery.  You may leave the incision open to air.  You may replace a dressing/Band-Aid to cover the incision for comfort if you wish.  °8. ACTIVITIES as tolerated:   °a. You may resume regular (light) daily activities beginning the next day--such as daily self-care, walking, climbing stairs--gradually increasing activities as tolerated.  If you can walk 30 minutes without difficulty, it   is safe to try more intense activity such as jogging, treadmill, bicycling, low-impact aerobics, swimming, etc. b. Save the most intensive and strenuous activity for last such as sit-ups, heavy lifting, contact sports, etc  Refrain from any heavy lifting or straining until you are off narcotics for pain control.   c. DO NOT PUSH THROUGH PAIN.  Let pain be your guide: If it hurts to do something, don't  do it.  Pain is your body warning you to avoid that activity for another week until the pain goes down. d. You may drive when you are no longer taking prescription pain medication, you can comfortably wear a seatbelt, and you can safely maneuver your car and apply brakes. e. Dennis Bast may have sexual intercourse when it is comfortable.  9. FOLLOW UP in our office a. Please call [facility] at [phone] to set up an appointment to see your surgeon in the office for a follow-up appointment approximately 2-3 weeks after your surgery. b. Make sure that you call for this appointment the day you arrive home to insure a convenient appointment time. 10. IF YOU HAVE DISABILITY OR FAMILY LEAVE FORMS, BRING THEM TO THE OFFICE FOR PROCESSING.  DO NOT GIVE THEM TO YOUR DOCTOR.   WHEN TO CALL us [phone]: 1. Poor pain control 2. Reactions / problems with new medications (rash/itching, nausea, etc)  3. Fever over 101.5 F (38.5 C) 4. Inability to urinate 5. Nausea and/or vomiting 6. Worsening swelling or bruising 7. Continued bleeding from incision. 8. Increased pain, redness, or drainage from the incision   The clinic staff is available to answer your questions during regular business hours (8:30am-5pm).  Please dont hesitate to call and ask to speak to one of our nurses for clinical concerns.   If you have a medical emergency, go to the nearest emergency room or call 911.  A surgeon from [facility] is always on call at the [facility], [place], [place], [place], [place]  [address] ? MAIN: [phone] ? TOLL FREE: [phone] ?  FAX [phone] [email or web address]  GETTING TO GOOD BOWEL HEALTH. Irregular bowel habits such as constipation and diarrhea can lead to many problems over time.  Having one soft bowel movement a day is the most important way to prevent further problems.  The anorectal canal is designed to handle  stretching and feces to safely manage our ability to get rid of solid waste (feces, poop, stool) out of our body.  BUT, hard constipated stools can act like ripping concrete bricks and diarrhea can be a burning fire to this very sensitive area of our body, causing inflamed hemorrhoids, anal fissures, increasing risk is perirectal abscesses, abdominal pain/bloating, an making irritable bowel worse.     The goal: ONE SOFT BOWEL MOVEMENT A DAY!  To have soft, regular bowel movements:   Drink at least 8 tall glasses of water a day.    Take plenty of fiber.  Fiber is the undigested part of plant food that passes into the colon, acting s natures broom to encourage bowel motility and movement.  Fiber can absorb and hold large amounts of water. This results in a larger, bulkier stool, which is soft and easier to pass. Work gradually over several weeks up to 6 servings a day of fiber (25g a day even more if needed) in the form of: o Vegetables -- Root (potatoes, carrots, turnips), leafy green (lettuce, salad greens, celery, spinach), or  cooked high residue (cabbage, broccoli, etc) o Fruit -- Fresh (unpeeled skin & pulp), Dried (prunes, apricots, cherries, etc ),  or stewed ( applesauce)  o Whole grain breads, pasta, etc (whole wheat)  o Bran cereals   Bulking Agents -- This type of water-retaining fiber generally is easily obtained each day by one of the following:  o Psyllium bran -- The psyllium plant is remarkable because its ground seeds can retain so much water. This product is available as Metamucil, Konsyl, Effersyllium, Per Diem Fiber, or the less expensive generic preparation in drug and health food stores. Although labeled a laxative, it really is not a laxative.  o Methylcellulose -- This is another fiber derived from wood which also retains water. It is available as Citrucel. o Polyethylene Glycol - and artificial fiber commonly called Miralax or Glycolax.  It is helpful for people with gassy or  bloated feelings with regular fiber o Flax Seed - a less gassy fiber than psyllium  No reading or other relaxing activity while on the toilet. If bowel movements take longer than 5 minutes, you are too constipated  AVOID CONSTIPATION.  High fiber and water intake usually takes care of this.  Sometimes a laxative is needed to stimulate more frequent bowel movements, but   Laxatives are not a good long-term solution as it can wear the colon out. o Osmotics (Milk of Magnesia, Fleets phosphosoda, Magnesium citrate, MiraLax, GoLytely) are safer than  o Stimulants (Senokot, Castor Oil, Dulcolax, Ex Lax)    o Do not take laxatives for more than 7days in a row.   IF SEVERELY CONSTIPATED, try a Bowel Retraining Program: o Do not use laxatives.  o Eat a diet high in roughage, such as bran cereals and leafy vegetables.  o Drink six (6) ounces of prune or apricot juice each morning.  o Eat two (2) large servings of stewed fruit each day.  o Take one (1) heaping tablespoon of a psyllium-based bulking agent twice a day. Use sugar-free sweetener when possible to avoid excessive calories.  o Eat a normal breakfast.  o Set aside 15 minutes after breakfast to sit on the toilet, but do not strain to have a bowel movement.  o If you do not have a bowel movement by the third day, use an enema and repeat the above steps.   Controlling diarrhea o Switch to liquids and simpler foods for a few days to avoid stressing your intestines further. o Avoid dairy products (especially milk & ice cream) for a short time.  The intestines often can lose the ability to digest lactose when stressed. o Avoid foods that cause gassiness or bloating.  Typical foods include beans and other legumes, cabbage, broccoli, and dairy foods.  Every person has some sensitivity to other foods, so listen to our body and avoid those foods that trigger problems for you. o Adding fiber (Citrucel, Metamucil, psyllium, Miralax) gradually can help  thicken stools by absorbing excess fluid and retrain the intestines to act more normally.  Slowly increase the dose over a few weeks.  Too much fiber too soon can backfire and cause cramping & bloating. o Probiotics (such as active yogurt, Align, etc) may help repopulate the intestines and colon with normal bacteria and calm down a sensitive digestive tract.  Most studies show it to be of mild help, though, and such products can be costly. o Medicines: - Bismuth subsalicylate (ex. Kayopectate, Pepto Bismol) every 30 minutes for up to 6 doses can help  control diarrhea.  Avoid if pregnant. - Loperamide (Immodium) can slow down diarrhea.  Start with two tablets (4mg  total) first and then try one tablet every 6 hours.  Avoid if you are having fevers or severe pain.  If you are not better or start feeling worse, stop all medicines and call your doctor for advice o Call your doctor if you are getting worse or not better.  Sometimes further testing (cultures, endoscopy, X-ray studies, bloodwork, etc) may be needed to help diagnose and treat the cause of the diarrhea.  Managing Pain  Pain after surgery or related to activity is often due to strain/injury to muscle, tendon, nerves and/or incisions.  This pain is usually short-term and will improve in a few months.   Many people find it helpful to do the following things TOGETHER to help speed the process of healing and to get back to regular activity more quickly:  1. Avoid heavy physical activity a.  no lifting greater than 20 pounds b. Do not push through the pain.  Listen to your body and avoid positions and maneuvers than reproduce the pain c. Walking is okay as tolerated, but go slowly and stop when getting sore.  d. Remember: If it hurts to do it, then dont do it! 2. Take Anti-inflammatory medication  a. Take with food/snack around the clock for 1-2 weeks i. This helps the muscle and nerve tissues become less irritable and calm down  faster b. Choose ONE of the following over-the-counter medications: i. Naproxen 220mg  tabs (ex. Aleve) 1-2 pills twice a day  ii. Ibuprofen 200mg  tabs (ex. Advil, Motrin) 3-4 pills with every meal and just before bedtime iii. Acetaminophen 500mg  tabs (Tylenol) 1-2 pills with every meal and just before bedtime 3. Use a Heating pad or Ice/Cold Pack a. 4-6 times a day b. May use warm bath/hottub  or showers 4. Try Gentle Massage and/or Stretching  a. at the area of pain many times a day b. stop if you feel pain - do not overdo it  Try these steps together to help you body heal faster and avoid making things get worse.  Doing just one of these things may not be enough.    If you are not getting better after two weeks or are noticing you are getting worse, contact our office for further advice; we may need to re-evaluate you & see what other things we can do to help.  Laparoscopic Appendectomy Appendectomy is surgery to remove the appendix. Laparoscopic surgery uses several small cuts (incisions) instead of one large incision. Laparoscopic surgery offers a shorter recovery time and less discomfort. LET YOUR CAREGIVER KNOW ABOUT:  Allergies to food or medicine.  Medicines taken, including vitamins, dietary supplements, herbs, eyedrops, over-the-counter medicines, and creams.  Use of steroids (by mouth or creams).  Previous problems with anesthetics or numbing medicines.  History of bleeding problems or blood clots.  Previous surgery.  Other health problems, including diabetes, heart problems, lung problems, and kidney problems.  Possibility of pregnancy, if this applies. RISKS AND COMPLICATIONS  Infection. A germ starts growing in the wound. This can usually be treated with antibiotics. In some cases, the wound will need to be opened and cleaned.  Bleeding.  Damage to other organs.  Sores (abscesses).  Chronic pain at the incision sites. This is defined as pain that lasts for  more than 3 months.  Blood clots in the legs that may rarely travel to the lungs.  Infection in the lungs (  pneumonia). BEFORE THE PROCEDURE Appendectomy is usually performed immediately after an inflamed appendix (appendicitis) is diagnosed. No preparation is necessary ahead of this procedure. PROCEDURE  You will be given medicine that makes you sleep (general anesthetic). After you are asleep, a flexible tube (catheter) may be inserted into your bladder to drain your urine during surgery. The tube is removed before you wake up after surgery. When you are asleep, carbondioxide gas will be used to inflate your abdomen. This will allow your surgeon to see inside your abdomen and perform your surgery. Three small incisions will be made in your abdomen. Your surgeon will insert a thin, lighted tube (laparoscope) through one of the incisions. Your surgeon will look through the laparoscope while performing the surgery. Other tools will be inserted through the other incisions. Laparoscopic procedures may not be appropriate when:  There is major scarring from a previous surgery.  The patient has bleeding disorders.  A pregnancy is near term.  There are other conditions which make the laparoscopic procedure impossible, such as an advanced infection or a ruptured appendix. If your surgeon feels it is not safe to continue with the laparoscopic procedure, he or she will perform an open surgery instead. This gives the surgeon a larger view and more space to work. Open surgery requires a longer recovery time. After your appendix is removed, your incisions will be closed with stitches (sutures) or skin adhesive. AFTER THE PROCEDURE You will be taken to a recovery room. When the anesthesia has worn off, you will be returned to your hospital room. You will be given pain medicines to keep you comfortable. Ask your caregiver how long your hospital stay will be. Document Released: 08/05/2003 Document Revised:  03/15/2011 Document Reviewed: 06/30/2010 [facility] Patient Information 2015 [place], [place]. This information is not intended to replace advice given to you by your health care provider. Make sure you discuss any questions you have with your health care provider.     General Anesthesia, Care After Refer to this sheet in the next few weeks. These instructions provide you with information on caring for yourself after your procedure. Your health care provider may also give you more specific instructions. Your treatment has been planned according to current medical practices, but problems sometimes occur. Call your health care provider if you have any problems or questions after your procedure. WHAT TO EXPECT AFTER THE PROCEDURE After the procedure, it is typical to experience:  Sleepiness.  Nausea and vomiting. HOME CARE INSTRUCTIONS  For the first 24 hours after general anesthesia:  Have a responsible person with you.  Do not drive a car. If you are alone, do not take public transportation.  Do not drink alcohol.  Do not take medicine that has not been prescribed by your health care provider.  Do not sign important papers or make important decisions.  You may resume a normal diet and activities as directed by your health care provider.  Change bandages (dressings) as directed.  If you have questions or problems that seem related to general anesthesia, call the hospital and ask for the anesthetist or anesthesiologist on call. SEEK MEDICAL CARE IF:  You have nausea and vomiting that continue the day after anesthesia.  You develop a rash. SEEK IMMEDIATE MEDICAL CARE IF:   You have difficulty breathing.  You have chest pain.  You have any allergic problems. Document Released: 03/29/2000 Document Revised: 12/26/2012 Document Reviewed: 07/06/2012 [facility] Patient Information 2015 [place], [place]. This information is not intended to replace advice  given to you by your health care  provider. Make sure you discuss any questions you have with your health care provider.

## 2014-02-28 NOTE — H&P (View-Only) (Signed)
[name] 02/20/2014 10:29 AM Location: [facility] Patient #: [id number] DOB: 05/24/1940 Divorced / Language: Cleophus Molt / Race: White Female History of Present Illness [name]; 02/20/2014 11:01 AM) Patient words: append.  The patient is a 74 year old female who presents with an abdominal mass. Patient sent by her gastroenterologist Dr. Kathe Becton over concerns of mucous seal the appendix. Pleasant active woman. History of colon polyps. Underwent follow-up colonoscopy. Tubular adenoma noted at hepatic flexure. Bulging seen at the appendiceal orifice. Biopsies not consistent with adenomatous tissue. CT scan shows swelling of the appendix suspicious for mucocele. Surgical consultation requested consider appendectomy. Patient comes today with her friend. She normally moves her bowels about every other day. Takes Senokot. Has some intermittent abdominal cramping and bloating but not severe. Been going on for decades. No bleeding. Can walk a half hour without difficulty. She had a tubal ligation but no other surgeries. No problems with eating certain foods. Some heartburn and reflux. Usually well controlled. Question of gallbladder sludge on CAT scan the past but denies any episodes of biliary colic to my history. No real nausea or vomiting. Mild fullness but no severe early satiety. Other Problems Elbert Ewings, CMA; 02/20/2014 10:29 AM) Breast Cancer Cholelithiasis Gastroesophageal Reflux Disease Hemorrhoids Sleep Apnea Thyroid Disease  Past Surgical History Elbert Ewings, CMA; 02/20/2014 10:29 AM) Cataract Surgery Left. Colon Polyp Removal - Colonoscopy Foot Surgery Left. Mastectomy Left. Thyroid Surgery  Diagnostic Studies History Elbert Ewings, [place]; 02/20/2014 10:29 AM) Colonoscopy within last year Mammogram within last year Pap Smear 1-5 years ago  Allergies Elbert Ewings, CMA; 02/20/2014 10:31 AM) Sulfa 10 *OPHTHALMIC  AGENTS*  Medication History Elbert Ewings, CMA; 02/20/2014 10:35 AM) Aspirin (81MG  Tablet, Oral) Active. Calcium Ascorbate (500MG  Tablet, Oral) Active. Donepezil HCl (10MG  Tablet Disperse, Oral) Active. Fish Oil + D3 (1000-1000MG -UNIT Capsule, Oral) Active. Gabapentin (300MG  Capsule, Oral) Active. Ibuprofen IB (200MG  Tablet, Oral) Active. Lansoprazole (15MG  Capsule DR, Oral) Active. Levothyroxine Sodium (125MCG Tablet, Oral) Active. Lisinopril-Hydrochlorothiazide (10-12.5MG  Tablet, Oral) Active. Multi Vitamin Daily (Oral) Active. Nortriptyline HCl (25MG  Capsule, Oral) Active. Senokot S (8.6-50MG  Tablet, Oral) Active. Vitamin B-12 (2000MCG Tablet, Oral) Active.  Social History Elbert Ewings, [place]; 02/20/2014 10:29 AM) Caffeine use Coffee. No alcohol use No drug use Tobacco use Never smoker.  Family History Elbert Ewings, [place]; 02/20/2014 10:29 AM) Family history unknown First Degree Relatives Heart disease in female family member before age 17  Pregnancy / Birth History Elbert Ewings, [place]; 02/20/2014 10:29 AM) Age at menarche 30 years. Age of menopause 98-55 Gravida 2 Maternal age 8-25 Para 2     Review of Systems Elbert Ewings CMA; 02/20/2014 10:29 AM) General Present- Fatigue. Not Present- Appetite Loss, Chills, Fever, Night Sweats, Weight Gain and Weight Loss. HEENT Present- Hoarseness. Not Present- Earache, Hearing Loss, Nose Bleed, Oral Ulcers, Ringing in the Ears, Seasonal Allergies, Sinus Pain, Sore Throat, Visual Disturbances, Wears glasses/contact lenses and Yellow Eyes. Cardiovascular Present- Leg Cramps. Not Present- Chest Pain, Difficulty Breathing Lying Down, Palpitations, Rapid Heart Rate, Shortness of Breath and Swelling of Extremities. Gastrointestinal Present- Hemorrhoids. Not Present- Abdominal Pain, Bloating, Bloody Stool, Change in Bowel Habits, Chronic diarrhea, Constipation, Difficulty Swallowing, Excessive gas, Gets full quickly at meals,  Indigestion, Nausea, Rectal Pain and Vomiting. Musculoskeletal Not Present- Back Pain, Joint Pain, Joint Stiffness, Muscle Pain, Muscle Weakness and Swelling of Extremities. Neurological Not Present- Decreased Memory, Fainting, Headaches, Numbness, Seizures, Tingling, Tremor, Trouble walking and Weakness. Psychiatric Not Present- Anxiety, Bipolar, Change in Sleep Pattern, Depression,  Fearful and Frequent crying. Endocrine Not Present- Cold Intolerance, Excessive Hunger, Hair Changes, Heat Intolerance, Hot flashes and New Diabetes. Hematology Not Present- Easy Bruising, Excessive bleeding, Gland problems, HIV and Persistent Infections.  Vitals Elbert Ewings CMA; 02/20/2014 10:36 AM) 02/20/2014 10:35 AM Weight: 179.8 lb Height: 69in Body Surface Area: 1.99 m Body Mass Index: 26.55 kg/m Temp.: 96.45F(Temporal)  Pulse: 60 (Regular)  Resp.: 16 (Unlabored)  BP: 122/60 (Sitting, Left Arm, Standard)     Physical Exam [name]; 02/20/2014 11:02 AM)  General Mental Status-Alert. General Appearance-Not in acute distress, Not Sickly. Orientation-Oriented X3. Hydration-Well hydrated. Voice-Normal.  Integumentary Global Assessment Upon inspection and palpation of skin surfaces of the - Axillae: non-tender, no inflammation or ulceration, no drainage. and Distribution of scalp and body hair is normal. General Characteristics Temperature - normal warmth is noted.  Head and Neck Head-normocephalic, atraumatic with no lesions or palpable masses. Face Global Assessment - atraumatic, no absence of expression. Neck Global Assessment - no abnormal movements, no bruit auscultated on the right, no bruit auscultated on the left, no decreased range of motion, non-tender. Trachea-midline. Thyroid Gland Characteristics - non-tender.  Eye Eyeball - Left-Extraocular movements intact, No Nystagmus. Eyeball - Right-Extraocular movements intact, No  Nystagmus. Cornea - Left-No Hazy. Cornea - Right-No Hazy. Sclera/Conjunctiva - Left-No scleral icterus, No Discharge. Sclera/Conjunctiva - Right-No scleral icterus, No Discharge. Pupil - Left-Direct reaction to light normal. Pupil - Right-Direct reaction to light normal.  ENMT Ears Pinna - Left - no drainage observed, no generalized tenderness observed. Right - no drainage observed, no generalized tenderness observed. Nose and Sinuses External Inspection of the Nose - no destructive lesion observed. Inspection of the nares - Left - quiet respiration. Right - quiet respiration. Mouth and Throat Lips - Upper Lip - no fissures observed, no pallor noted. Lower Lip - no fissures observed, no pallor noted. Nasopharynx - no discharge present. Oral Cavity/Oropharynx - Tongue - no dryness observed. Oral Mucosa - no cyanosis observed. Hypopharynx - no evidence of airway distress observed.  Chest and Lung Exam Inspection Movements - Normal and Symmetrical. Accessory muscles - No use of accessory muscles in breathing. Palpation Palpation of the chest reveals - Non-tender. Auscultation Breath sounds - Normal and Clear.  Cardiovascular Auscultation Rhythm - Regular. Murmurs & Other Heart Sounds - Auscultation of the heart reveals - No Murmurs and No Systolic Clicks.  Abdomen Inspection Inspection of the abdomen reveals - No Visible peristalsis and No Abnormal pulsations. Umbilicus - No Bleeding, No Urine drainage. Palpation/Percussion Palpation and Percussion of the abdomen reveal - Soft, Non Tender, No Rebound tenderness, No Rigidity (guarding) and No Cutaneous hyperesthesia. Note: Slightly overweight but soft. Mild abdominal discomfort bilateral lower quadrants. No guarding. No rebound tenderness. Infraumbilical incision well healed. No hernias. No Murphy sign.   Female Genitourinary Sexual Maturity Tanner 5 - Adult hair pattern. Note: No vaginal bleeding nor  discharge   Peripheral Vascular Upper Extremity Inspection - Left - No Cyanotic nailbeds, Not Ischemic. Right - No Cyanotic nailbeds, Not Ischemic.  Neurologic Neurologic evaluation reveals -normal attention span and ability to concentrate, able to name objects and repeat phrases. Appropriate fund of knowledge , normal sensation and normal coordination. Mental Status Affect - not angry, not paranoid. Cranial Nerves-Normal Bilaterally. Gait-Normal.  Neuropsychiatric Mental status exam performed with findings of-able to articulate well with normal speech/language, rate, volume and coherence, thought content normal with ability to perform basic computations and apply abstract reasoning and no evidence of hallucinations, delusions, obsessions or  homicidal/suicidal ideation.  Musculoskeletal Global Assessment Spine, Ribs and Pelvis - no instability, subluxation or laxity. Right Upper Extremity - no instability, subluxation or laxity.  Lymphatic Head & Neck  General Head & Neck Lymphatics: Bilateral - Description - No Localized lymphadenopathy. Axillary  General Axillary Region: Bilateral - Description - No Localized lymphadenopathy. Femoral & Inguinal  Generalized Femoral & Inguinal Lymphatics: Left - Description - No Localized lymphadenopathy. Right - Description - No Localized lymphadenopathy.    Assessment & Plan [name]; 02/20/2014 10:55 AM)  MUCOCELE, APPENDIX (543.9  K38.8) Impression: Incidentally noted on colonoscopy. I think she would benefit from laparoscopic cholecystectomy with partial sacrectomy. Hopefully could be outpatient procedure. Ileocecectomy less likely and less difficult to get good margins. Reasonable laparoscopic candidate. Patient on her friend had questions. There were answered. She agrees to proceed.  The anatomy & physiology of the digestive tract was discussed.  The pathophysiology of appendicitis and other appendiceal disorders were  discussed.  Natural history risks without surgery was discussed.   I feel the risks of no intervention will lead to serious problems that outweigh the operative risks; therefore, I recommended diagnostic laparoscopy with removal of appendix to remove the pathology.  Laparoscopic & open techniques were discussed.   I noted a good likelihood this will help address the problem.   Risks such as bleeding, infection, abscess, leak, reoperation, possible ostomy, hernia, heart attack, stroke, death, and other risks were discussed.  Goals of post-operative recovery were discussed as well.  We will work to minimize complications.  Questions were answered.  The patient expresses understanding & wishes to proceed with surgery.   Current Plans Schedule for Surgery Pt Education - [facility] Laparosopic Post Op HCI ([name]) Pt Education - [facility] Good Bowel Health ([name]) Pt Education - [facility] Pain Control ([name]) Instructions:  Laparoscopic Appendectomy Appendectomy is surgery to remove the appendix. Laparoscopic surgery uses several small cuts (incisions) instead of one large incision. Laparoscopic surgery offers a shorter recovery time and less discomfort. LET YOUR CAREGIVER KNOW ABOUT:  Allergies to food or medicine.  Medicines taken, including vitamins, dietary supplements, herbs, eyedrops, over-the-counter medicines, and creams.  Use of steroids (by mouth or creams).  Previous problems with anesthetics or numbing medicines.  History of bleeding problems or blood clots.  Previous surgery.  Other health problems, including diabetes, heart problems, lung problems, and kidney problems.  Possibility of pregnancy, if this applies. RISKS AND COMPLICATIONS  Infection. A germ starts growing in the wound. This can usually be treated with antibiotics. In some cases, the wound will need to be opened and cleaned.  Bleeding.  Damage to other organs.  Sores (abscesses).  Chronic pain at the incision sites. This is  defined as pain that lasts for more than 3 months.  Blood clots in the legs that may rarely travel to the lungs.  Infection in the lungs (pneumonia). BEFORE THE PROCEDURE Appendectomy is usually performed immediately after an inflamed appendix (appendicitis) is diagnosed. No preparation is necessary ahead of this procedure. PROCEDURE You will be given medicine that makes you sleep (general anesthetic). After you are asleep, a flexible tube (catheter) may be inserted into your bladder to drain your urine during surgery. The tube is removed before you wake up after surgery. When you are asleep, carbon dioxide gas will be used to inflate your abdomen. This will allow your surgeon to see inside your abdomen and perform your surgery. Three small incisions will be made in your abdomen. Your surgeon will  insert a thin, lighted tube (laparoscope) through one of the incisions. Your surgeon will look through the laparoscope while performing the surgery. Other tools will be inserted through the other incisions. Laparoscopic procedures may not be appropriate when:  There is major scarring from a previous surgery.  The patient has bleeding disorders.  A pregnancy is near term.  There are other conditions which make the laparoscopic procedure impossible, such as an advanced infection or a ruptured appendix. If your surgeon feels it is not safe to continue with the laparoscopic procedure, he or she will perform an open surgery instead. This gives the surgeon a larger view and more space to work. Open surgery requires a longer recovery time. After your appendix is removed, your incisions will be closed with stitches (sutures) or skin adhesive. AFTER THE PROCEDURE You will be taken to a recovery room. When the anesthesia has worn off, you will be returned to your hospital room. You will be given pain medicines to keep you comfortable. Ask your caregiver how long your hospital stay will be. Document Released:  08/05/2003 Document Revised: 03/15/2011 Document Reviewed: 06/30/2010 [facility] Patient Information 2015 [place], [place]. This information is not intended to replace advice given to you by your health care provider. Make sure you discuss any questions you have with your health care provider  [name], M.D., F.A.C.S. Gastrointestinal and Minimally Invasive Surgery [facility] [address] [place], [place] [address] [phone] Main / Paging

## 2014-02-28 NOTE — Transfer of Care (Signed)
Immediate Anesthesia Transfer of Care Note  Patient: [name]  Procedure(s) Performed: Procedure(s): APPENDECTOMY LAPAROSCOPIC PARTIAL CECECTOMY FOR MUCOCELE OF APPENDIX  (N/A)  Patient Location: PACU  Anesthesia Type:General  Level of Consciousness: awake, alert  and oriented  Airway & Oxygen Therapy: Patient Spontanous Breathing and Patient connected to face mask oxygen  Post-op Assessment: Report given to RN  Post vital signs: Reviewed and stable  Last Vitals:  Filed Vitals:   02/28/14 1001  BP: 140/77  Pulse: 70  Temp: 36.6 C  Resp: 18    Complications: No apparent anesthesia complications

## 2014-02-28 NOTE — Op Note (Signed)
12:49 PM  PATIENT:  [name]  74 y.o. female  Patient Care Team: Chipper Herb, MD as PCP - General (Family Medicine) Boyd Kerbs, MD (Specialist) Jeanie Cooks, MD (Hematology and Oncology)  PRE-OPERATIVE DIAGNOSIS:  Mucocele of Appendix  POST-OPERATIVE DIAGNOSIS:  Mucocele of Appendix  PROCEDURE:  Procedure(s): LAPAROSCOPIC APPENDECTOMY WITH PARTIAL CECECTOMY   SURGEON:  Surgeon(s): Michael Boston, MD  ANESTHESIA:   local and general  EBL:  Total I/O In: 1000 [I.V.:1000] Out: 10 [Blood:10]  Delay start of Pharmacological VTE agent (>24hrs) due to surgical blood loss or risk of bleeding:  no  DRAINS: none   SPECIMEN:  Source of Specimen:  APPENDIX with cecal wall  DISPOSITION OF SPECIMEN:  PATHOLOGY  COUNTS:  YES  PLAN OF CARE: Discharge to home after PACU  PATIENT DISPOSITION:  PACU - hemodynamically stable.   INDICATIONS: Patient with concerning suspicious for mucocele of the appendix.  Surgery was recommended:  The anatomy & physiology of the digestive tract was discussed.  The pathophysiology of appendiceal masses was discussed.  Natural history risks without surgery was discussed.   I feel the risks of no intervention will lead to serious problems that outweigh the operative risks; therefore, I recommended diagnostic laparoscopy with removal of appendix to remove the pathology.  Laparoscopic & open techniques were discussed.   I noted a good likelihood this will help address the problem.    Risks such as bleeding, infection, abscess, leak, reoperation, possible ostomy, hernia, heart attack, death, and other risks were discussed.  Goals of post-operative recovery were discussed as well.  We will work to minimize complications.  Questions were answered.  The patient expresses understanding & wishes to proceed with surgery.  OR FINDINGS: Moderately dilated appendix.  Distal two thirds.  4 x 1.5 x 1.5 cm.  Proximal 2 cm of appendix appears to be spared.  No  appendicitis.  No Meckel's diverticulum.  Mild adhesions right side not severe.  DESCRIPTION:   The patient was identified & brought into the operating room. The patient was positioned supine with arms tucked. SCDs were active during the entire case. The patient underwent general anesthesia without any difficulty.  The abdomen was prepped and draped in a sterile fashion. A Surgical Timeout confirmed our plan.  I made a transverse incision through the superior umbilical fold.  I made a small transverse nick through the infraumbilical fascia and confirmed peritoneal entry.  I placed a 93mm port.  We induced carbon dioxide insufflation.  Camera inspection revealed no injury.  I placed additional ports under direct laparoscopic visualization.  I freed greater omental adhesions off the anterior abdominal wall as well as the hepatic flexure and ascending colon down towards the cecum.  I mobilized the terminal ileum to proximal ascending colon in a lateral to medial fashion.  I took care to avoid injuring any retroperitoneal structures.   I freed the ileocecal region off the right adnexa.  I freed the appendix off its attachments to the ascending colon and cecal mesentery.  I elevated the appendix. I skeletonized the mesoappendix. I was able to free off the base of the appendix which was still viable.  I transected the mesial appendix with a generous resection I stapled the appendix off the cecum using a laparoscopic stapler. I took a 2cm healthy cuff of viable cecum.  I placed the appendix inside an EndoCatch bag and removed out the 12 mm port.  I did copious irrigation. Hemostasis was good in the mesoappendix,  colon mesentery, and retroperitoneum. Staple line was intact on the cecum with no bleeding. I washed out the pelvis, retrohepatic space and right paracolic gutter. I washed out the left side as well.  Hemostasis is good. There was no perforation or injury.  Because the area cleaned up well after  irrigation, I did not place a drain.  I aspirated the carbon dioxide. I removed the ports. I closed the 12 mm fascia site using a 0 Vicryl stitch. I closed skin using 4-0 monocryl stitch.  Patient was extubated and sent to the recovery room.  I discussed the operative findings with the patient's family.  Questions answered. They expressed understanding and appreciation.  Adin Hector, M.D., F.A.C.S. Gastrointestinal and Minimally Invasive Surgery [facility] [address], [place], [place] [address] [phone] Main / Paging

## 2014-02-28 NOTE — Anesthesia Procedure Notes (Signed)
Procedure Name: Intubation Date/Time: 02/28/2014 11:37 AM Performed by: Kaysen Sefcik, Virgel Gess Pre-anesthesia Checklist: Patient identified, Emergency Drugs available, Suction available, Patient being monitored and Timeout performed Patient Re-evaluated:Patient Re-evaluated prior to inductionOxygen Delivery Method: Circle system utilized Preoxygenation: Pre-oxygenation with 100% oxygen Intubation Type: IV induction Ventilation: Mask ventilation without difficulty Laryngoscope Size: Mac and 4 Grade View: Grade II Tube type: Oral Tube size: 7.5 mm Number of attempts: 1 Airway Equipment and Method: Stylet Placement Confirmation: ETT inserted through vocal cords under direct vision,  positive ETCO2,  CO2 detector and breath sounds checked- equal and bilateral Secured at: 22 cm Tube secured with: Tape Dental Injury: Teeth and Oropharynx as per pre-operative assessment

## 2014-02-28 NOTE — Interval H&P Note (Signed)
History and Physical Interval Note:  02/28/2014 10:47 AM  [name]  has presented today for surgery, with the diagnosis of Mucocele of Appendix  The various methods of treatment have been discussed with the patient and family. After consideration of risks, benefits and other options for treatment, the patient has consented to  Procedure(s): APPENDECTOMY LAPAROSCOPIC (N/A) as a surgical intervention .  The patient's history has been reviewed, patient examined, no change in status, stable for surgery.  I have reviewed the patient's chart and labs.  Questions were answered to the patient's satisfaction.     Dailan Pfalzgraf C.

## 2014-03-01 ENCOUNTER — Ambulatory Visit: Payer: Medicare Other

## 2014-03-01 ENCOUNTER — Encounter (HOSPITAL_COMMUNITY): Payer: Self-pay | Admitting: Surgery

## 2014-03-05 HISTORY — PX: APPENDECTOMY: SHX54

## 2014-03-06 DIAGNOSIS — G4733 Obstructive sleep apnea (adult) (pediatric): Secondary | ICD-10-CM | POA: Diagnosis not present

## 2014-03-18 DIAGNOSIS — M5417 Radiculopathy, lumbosacral region: Secondary | ICD-10-CM | POA: Diagnosis not present

## 2014-03-18 DIAGNOSIS — M5412 Radiculopathy, cervical region: Secondary | ICD-10-CM | POA: Diagnosis not present

## 2014-03-18 DIAGNOSIS — G603 Idiopathic progressive neuropathy: Secondary | ICD-10-CM | POA: Diagnosis not present

## 2014-03-18 DIAGNOSIS — G500 Trigeminal neuralgia: Secondary | ICD-10-CM | POA: Diagnosis not present

## 2014-04-15 DIAGNOSIS — G603 Idiopathic progressive neuropathy: Secondary | ICD-10-CM | POA: Diagnosis not present

## 2014-04-15 DIAGNOSIS — M5412 Radiculopathy, cervical region: Secondary | ICD-10-CM | POA: Diagnosis not present

## 2014-04-15 DIAGNOSIS — G5602 Carpal tunnel syndrome, left upper limb: Secondary | ICD-10-CM | POA: Diagnosis not present

## 2014-04-15 DIAGNOSIS — M5417 Radiculopathy, lumbosacral region: Secondary | ICD-10-CM | POA: Diagnosis not present

## 2014-04-15 DIAGNOSIS — G5601 Carpal tunnel syndrome, right upper limb: Secondary | ICD-10-CM | POA: Diagnosis not present

## 2014-04-17 ENCOUNTER — Ambulatory Visit (INDEPENDENT_AMBULATORY_CARE_PROVIDER_SITE_OTHER): Payer: Medicare Other | Admitting: Family Medicine

## 2014-04-17 ENCOUNTER — Encounter: Payer: Self-pay | Admitting: Family Medicine

## 2014-04-17 VITALS — BP 138/78 | HR 66 | Temp 97.0°F | Ht 70.0 in | Wt 175.0 lb

## 2014-04-17 DIAGNOSIS — E8881 Metabolic syndrome: Secondary | ICD-10-CM

## 2014-04-17 DIAGNOSIS — E785 Hyperlipidemia, unspecified: Secondary | ICD-10-CM | POA: Diagnosis not present

## 2014-04-17 DIAGNOSIS — G500 Trigeminal neuralgia: Secondary | ICD-10-CM

## 2014-04-17 DIAGNOSIS — E039 Hypothyroidism, unspecified: Secondary | ICD-10-CM | POA: Diagnosis not present

## 2014-04-17 DIAGNOSIS — I10 Essential (primary) hypertension: Secondary | ICD-10-CM | POA: Diagnosis not present

## 2014-04-17 DIAGNOSIS — E538 Deficiency of other specified B group vitamins: Secondary | ICD-10-CM

## 2014-04-17 DIAGNOSIS — E559 Vitamin D deficiency, unspecified: Secondary | ICD-10-CM

## 2014-04-17 DIAGNOSIS — K219 Gastro-esophageal reflux disease without esophagitis: Secondary | ICD-10-CM | POA: Diagnosis not present

## 2014-04-17 LAB — POCT CBC
GRANULOCYTE PERCENT: 74.9 % (ref 37–80)
HCT, POC: 40.4 % (ref 37.7–47.9)
HEMOGLOBIN: 13.1 g/dL (ref 12.2–16.2)
LYMPH, POC: 1.6 (ref 0.6–3.4)
MCH: 29.6 pg (ref 27–31.2)
MCHC: 32.4 g/dL (ref 31.8–35.4)
MCV: 91.5 fL (ref 80–97)
MPV: 8.6 fL (ref 0–99.8)
PLATELET COUNT, POC: 258 10*3/uL (ref 142–424)
POC GRANULOCYTE: 5.7 (ref 2–6.9)
POC LYMPH %: 20.5 % (ref 10–50)
RBC: 4.42 M/uL (ref 4.04–5.48)
RDW, POC: 14.2 %
WBC: 7.6 10*3/uL (ref 4.6–10.2)

## 2014-04-17 NOTE — Patient Instructions (Addendum)
Medicare Annual Wellness Visit  [facility] and the medical providers at [facility] strive to bring you the best medical care.  In doing so we not only want to address your current medical conditions and concerns but also to detect new conditions early and prevent illness, disease and health-related problems.    Medicare offers a yearly Wellness Visit which allows our clinical staff to assess your need for preventative services including immunizations, lifestyle education, counseling to decrease risk of preventable diseases and screening for fall risk and other medical concerns.    This visit is provided free of charge (no copay) for all Medicare recipients. The clinical pharmacists at [facility] have begun to conduct these Wellness Visits which will also include a thorough review of all your medications.    As you primary medical provider recommend that you make an appointment for your Annual Wellness Visit if you have not done so already this year.  You may set up this appointment before you leave today or you may call back ([phone]) and schedule an appointment.  Please make sure when you call that you mention that you are scheduling your Annual Wellness Visit with the clinical pharmacist so that the appointment may be made for the proper length of time.     Continue current medications. Continue good therapeutic lifestyle changes which include good diet and exercise. Fall precautions discussed with patient. If an FOBT was given today- please return it to our front desk. If you are over 25 years old - you may need Prevnar 19 or the adult Pneumonia vaccine.  Flu Shots are still available at our office. If you still haven't had one please call to set up a nurse visit to get one.   After your visit with [place] today you will receive a survey in the mail or online from [employer] regarding your care with [place]. Please take a moment to  fill this out. Your feedback is very important to [place] as you can help [place] better understand your patient needs as well as improve your experience and satisfaction. WE CARE ABOUT YOU!!!   She should continue taking Mucinex, blue and white in color, 1 twice daily with a large glass of water. She should purchase some Flonase over-the-counter and use 1-2 sprays each nostril at bedtime She should use nasal saline for nasal congestion during the day She should continue her regular mammogram follow-ups and visits to the neurologist.

## 2014-04-17 NOTE — Progress Notes (Signed)
Subjective:    Patient ID: [name], female    DOB: [birth year], 74 y.o.   MRN: [id number]  HPI Pt here for follow up and management of chronic medical problems which includes hypertension, hyperlipidemia, and hypothyroid. She is taking medications regularly. In February as a result of a colonoscopy and removal of colon polyps and a subsequent CT scan and it was found that she had a mass of sorts in the appendiceal area. 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 She is doing well following removal of this and having no problems. She still has her trigeminal neuralgia and she recently went back for injections for this and is doing better with this. She's had some cough and congestion with all the pollen and she is currently taking Mucinex. Otherwise she denies chest pain GI symptoms or voiding symptoms. She is 20 years post mastectomy and gets her mammograms regularly.       Patient Active Problem List   Diagnosis Date Noted  . 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 Polyneuropathy in other diseases classified elsewhere 09/24/2013  . Memory disorder 09/24/2013  . Sleep apnea 05/15/2013  . Hypothyroid 05/15/2013  . HTN (hypertension) 05/15/2013  . Vitamin D deficiency 05/15/2013  . Hyperlipidemia 05/15/2013  . Osteopenia 10/25/2012  . Mild cognitive impairment with memory loss 09/27/2012  . Trigeminal neuralgia 04/02/2011  . Dysphagia 07/23/2010  . GERD (gastroesophageal reflux disease) 07/23/2010  . Right facial pain 07/23/2010  . ANXIETY 05/24/2008  . COPD 05/24/2008  . CONSTIPATION 05/24/2008  . ARTHRITIS 05/24/2008  . Idiopathic osteoporosis 05/24/2008  . ADENOCARCINOMA, BREAST 05/22/2008  . INTERNAL HEMORRHOIDS 05/22/2008  . COLONIC POLYPS, ADENOMATOUS, HX OF 05/22/2008   Outpatient Encounter Prescriptions as of 04/17/2014  Medication Sig  . aspirin 81 MG tablet Take 81 mg by mouth daily.    .  Calcium Carbonate-Vitamin D (CALCIUM 500/D) 500-125 MG-UNIT TABS Take 1 capsule by mouth daily.    . Cholecalciferol (VITAMIN D3) 2000 UNITS capsule Take 4,000 Units by mouth daily.   Marland Kitchen donepezil (ARICEPT) 10 MG tablet Take 10 mg by mouth daily.  Marland Kitchen gabapentin (NEURONTIN) 300 MG capsule Take 300-600 mg by mouth 2 (two) times daily. Takes 1 tab in am and 2 tabs at night  . ibuprofen (ADVIL,MOTRIN) 200 MG tablet Take 400 mg by mouth every 6 (six) hours as needed (Pain).   Marland Kitchen lansoprazole (PREVACID) 15 MG capsule Take 15 mg by mouth as needed. For heartburn  . levothyroxine (SYNTHROID, LEVOTHROID) 125 MCG tablet TAKE (1) TABLET BY MOUTH DAILY.  Marland Kitchen lisinopril-hydrochlorothiazide (PRINZIDE,ZESTORETIC) 10-12.5 MG per tablet TAKE (1) TABLET BY MOUTH DAILY.  . Multiple Vitamin (MULTIVITAMIN) capsule Take 1 capsule by mouth daily.    . nortriptyline (PAMELOR) 25 MG capsule Take 3 capsules by mouth at bedtime and may repeat dose one time if needed.  . Omega-3 Fatty Acids (FISH OIL) 1000 MG CAPS Take 1 capsule by mouth daily.    Marland Kitchen senna-docusate (SENOKOT S) 8.6-50 MG per tablet Take 1 tablet by mouth daily as needed for constipation.  . [DISCONTINUED] oxyCODONE (OXY IR/ROXICODONE) 5 MG immediate release tablet Take 1-2 tablets (5-10 mg total) by mouth every 4 (four) hours as needed for moderate pain, severe pain or breakthrough pain.    Review of Systems  Constitutional: Negative.   HENT: Negative.   Eyes: Negative.   Respiratory: Negative.   Cardiovascular: Negative.   Gastrointestinal: Negative.   Endocrine: Negative.  Genitourinary: Negative.   Musculoskeletal: Negative.   Skin: Negative.   Allergic/Immunologic: Negative.   Neurological: Negative.   Hematological: Negative.   Psychiatric/Behavioral: Negative.        Objective:   Physical Exam  Constitutional: She is oriented to person, place, and time. She appears well-developed and well-nourished. No distress.  The patient is pleasant and  alert.  HENT:  Head: Normocephalic and atraumatic.  Right Ear: External ear normal.  Left Ear: External ear normal.  Mouth/Throat: Oropharynx is clear and moist. No oropharyngeal exudate.  There is nasal congestion bilaterally.  Eyes: Conjunctivae and EOM are normal. Pupils are equal, round, and reactive to light. Right eye exhibits no discharge. Left eye exhibits no discharge. No scleral icterus.  Neck: Normal range of motion. Neck supple. No thyromegaly present.  No carotid bruits or anterior cervical adenopathy  Cardiovascular: Normal rate, regular rhythm, normal heart sounds and intact distal pulses.  Exam reveals no gallop and no friction rub.   No murmur heard. At 72/m with a regular rate and rhythm  Pulmonary/Chest: Effort normal and breath sounds normal. No respiratory distress. She has no wheezes. She has no rales. She exhibits no tenderness.  No axillary adenopathy The chest is clear anteriorly and posteriorly  Abdominal: Soft. Bowel sounds are normal. She exhibits no mass. There is no tenderness. There is no rebound and no guarding.  No inguinal adenopathy No abdominal tenderness masses or bruits  Musculoskeletal: Normal range of motion. She exhibits no edema or tenderness.  Lymphadenopathy:    She has no cervical adenopathy.  Neurological: She is alert and oriented to person, place, and time. She has normal reflexes. No cranial nerve deficit.  Skin: Skin is warm and dry. No rash noted.  There is slight erythema around the neck but she says she has problems with this for a long time left-sided was worse than the right side. This may be secondary to previous thyroid surgery.  Psychiatric: She has a normal mood and affect. Her behavior is normal. Judgment and thought content normal.  Nursing note and vitals reviewed.  BP 138/78 mmHg  Pulse 66  Temp(Src) 97 F (36.1 C) (Oral)  Ht 5\' 10"  (1.778 m)  Wt 175 lb (79.379 kg)  BMI 25.11 kg/m2        Assessment & Plan:     1.  B12 deficiency -We'll follow-up on the B12 levels let her know the results and if there is any change in treatment  2. Gastroesophageal reflux disease, esophagitis presence not specified -The patient still occasionally has some trouble swallowing and she will let us know if this gets worse and we will arrange a visit with the gastroenterologist to further evaluate this.  3. Essential hypertension -The blood pressure is good today but she will continue to watch her intake of sodium and she will continue with her current treatment  4. Hyperlipidemia -We will be checking her cholesterol numbers today and in the past the official has been appropriate and any further changes will be determined about the results of the lab work being done today.  5. Hypothyroidism, unspecified hypothyroidism type -She should continue with her current thyroid treatment pending results of lab work today.  6. Vitamin D deficiency -She should continue with her current vitamin D treatment pending results of lab work today  7. Metabolic syndrome -She should continue with aggressive therapeutic lifestyle changes working on weight exercising regularly and following her diet.  8. Trigeminal neuralgia -This is followed regularly by the  neurologist with periodic injections.  Patient Instructions                       Medicare Annual Wellness Visit  [facility] and the medical providers at [facility] strive to bring you the best medical care.  In doing so we not only want to address your current medical conditions and concerns but also to detect new conditions early and prevent illness, disease and health-related problems.    Medicare offers a yearly Wellness Visit which allows our clinical staff to assess your need for preventative services including immunizations, lifestyle education, counseling to decrease risk of preventable diseases and screening for fall risk and other medical concerns.      This visit is provided free of charge (no copay) for all Medicare recipients. The clinical pharmacists at [facility] have begun to conduct these Wellness Visits which will also include a thorough review of all your medications.    As you primary medical provider recommend that you make an appointment for your Annual Wellness Visit if you have not done so already this year.  You may set up this appointment before you leave today or you may call back ([phone]) and schedule an appointment.  Please make sure when you call that you mention that you are scheduling your Annual Wellness Visit with the clinical pharmacist so that the appointment may be made for the proper length of time.     Continue current medications. Continue good therapeutic lifestyle changes which include good diet and exercise. Fall precautions discussed with patient. If an FOBT was given today- please return it to our front desk. If you are over 23 years old - you may need Prevnar 33 or the adult Pneumonia vaccine.  Flu Shots are still available at our office. If you still haven't had one please call to set up a nurse visit to get one.   After your visit with [place] today you will receive a survey in the mail or online from [employer] regarding your care with [place]. Please take a moment to fill this out. Your feedback is very important to [place] as you can help [place] better understand your patient needs as well as improve your experience and satisfaction. WE CARE ABOUT YOU!!!   She should continue taking Mucinex, blue and white in color, 1 twice daily with a large glass of water. She should purchase some Flonase over-the-counter and use 1-2 sprays each nostril at bedtime She should use nasal saline for nasal congestion during the day She should continue her regular mammogram follow-ups and visits to the neurologist.   Arrie Senate MD

## 2014-04-17 NOTE — Addendum Note (Signed)
Addended by: Zannie Cove on: 04/17/2014 12:07 PM   Modules accepted: Orders

## 2014-04-18 LAB — BMP8+EGFR
BUN/Creatinine Ratio: 35 — ABNORMAL HIGH (ref 11–26)
BUN: 19 mg/dL (ref 8–27)
CHLORIDE: 101 mmol/L (ref 97–108)
CO2: 24 mmol/L (ref 18–29)
Calcium: 9.7 mg/dL (ref 8.7–10.3)
Creatinine, Ser: 0.54 mg/dL — ABNORMAL LOW (ref 0.57–1.00)
GFR calc Af Amer: 108 mL/min/{1.73_m2} (ref >59)
GFR calc non Af Amer: 94 mL/min/{1.73_m2} (ref >59)
Glucose: 96 mg/dL (ref 65–99)
POTASSIUM: 4.3 mmol/L (ref 3.5–5.2)
SODIUM: 144 mmol/L (ref 134–144)

## 2014-04-18 LAB — NMR, LIPOPROFILE
Cholesterol: 325 mg/dL — ABNORMAL HIGH (ref 100–199)
HDL Cholesterol by NMR: 74 mg/dL (ref >39)
HDL Particle Number: 40.7 umol/L (ref >=30.5)
LDL Particle Number: 2976 nmol/L — ABNORMAL HIGH (ref <1000)
LDL Size: 21 nm (ref >20.5)
LDL-C: 233 mg/dL — ABNORMAL HIGH (ref 0–99)
LP-IR Score: 61 — ABNORMAL HIGH (ref <=45)
Small LDL Particle Number: 926 nmol/L — ABNORMAL HIGH (ref <=527)
Triglycerides by NMR: 91 mg/dL (ref 0–149)

## 2014-04-18 LAB — HEPATIC FUNCTION PANEL
ALBUMIN: 4.7 g/dL (ref 3.5–4.8)
ALT: 25 IU/L (ref 0–32)
AST: 19 IU/L (ref 0–40)
Alkaline Phosphatase: 72 IU/L (ref 39–117)
Bilirubin Total: 0.4 mg/dL (ref 0.0–1.2)
Bilirubin, Direct: 0.13 mg/dL (ref 0.00–0.40)
Total Protein: 6.8 g/dL (ref 6.0–8.5)

## 2014-04-18 LAB — VITAMIN B12: Vitamin B-12: 361 pg/mL (ref 211–946)

## 2014-04-18 LAB — THYROID PANEL WITH TSH
Free Thyroxine Index: 4.2 (ref 1.2–4.9)
T3 Uptake Ratio: 32 % (ref 24–39)
T4, Total: 13.1 ug/dL — ABNORMAL HIGH (ref 4.5–12.0)
TSH: 0.164 u[IU]/mL — ABNORMAL LOW (ref 0.450–4.500)

## 2014-04-18 LAB — VITAMIN D 25 HYDROXY (VIT D DEFICIENCY, FRACTURES): Vit D, 25-Hydroxy: 48.3 ng/mL (ref 30.0–100.0)

## 2014-04-19 ENCOUNTER — Telehealth: Payer: Self-pay | Admitting: *Deleted

## 2014-04-19 NOTE — Telephone Encounter (Signed)
-----   Message from Chipper Herb, MD sent at 04/18/2014  5:16 PM EDT ----- The blood sugar is good at 96. The creatinine, the most important kidney function test is good. The electrolytes including potassium are within normal limits All liver function tests are within normal limits Cholesterol numbers with advanced lipid testing are much more elevated than in the past. The total LDL particle number is now almost 3000 at 2976. This number should be less than 1000. The LDL C is also elevated at 233. The triglycerides are good at 91. The patient is statin intolerant. Please have her meet with the clinical pharmacists and see if the clinical pharmacists we'll start a low-dose of some statin 2 or 3 times weekly as a trial. The TSH is low.------- this means that the patient may be getting too much thyroid medicine.----- according to the computer she is getting levothyroxin 125 g daily. Please confirm this. If she is taking 125 g daily, she should reduce this and take 125 g daily Monday through Saturday and take a half of one of these pills on Sunday only. She should have her thyroid rechecked in 6 weeks. This is very important to get this correct. The vitamin D level is good at 48.3 and the patient should continue with her current treatment The B12 level is good at 361 and no additional B12 is necessary

## 2014-04-19 NOTE — Telephone Encounter (Signed)
lmtcb regarding test results. 

## 2014-04-25 ENCOUNTER — Other Ambulatory Visit: Payer: Self-pay | Admitting: Family Medicine

## 2014-05-02 ENCOUNTER — Encounter: Payer: Self-pay | Admitting: Pharmacist

## 2014-05-02 ENCOUNTER — Ambulatory Visit (INDEPENDENT_AMBULATORY_CARE_PROVIDER_SITE_OTHER): Payer: Medicare Other | Admitting: Pharmacist

## 2014-05-02 VITALS — BP 112/64 | Ht 70.0 in | Wt 173.5 lb

## 2014-05-02 DIAGNOSIS — E785 Hyperlipidemia, unspecified: Secondary | ICD-10-CM

## 2014-05-02 MED ORDER — EZETIMIBE 10 MG PO TABS: 10.0000 mg | ORAL_TABLET | Freq: Every day | ORAL | Status: DC

## 2014-05-02 NOTE — Progress Notes (Signed)
Patient ID: [name], female   DOB: [birth year], 74 y.o.   MRN: [id number] Lipid Clinic Consultation  Chief Complaint:   Chief Complaint  Patient presents with  . Hyperlipidemia    HPI:  Patient with elevated LDL cholesterol.  She has tried several statin medications in the past including - lipitor, crestor, zocor and pravachol.  She has experienced a severe myalgias with all of these and refuses to try another statin medication.  She has even in the past had a specialist in [place] write a prescription that she is statin intolerant though she cannot recall his name today.  In December she was taking Zetia 10mg  daily but stopped because she thought it was causing her feel lightheaded.  Today she states that she thinks it was gabapentin which she has reduced and not Zetia     Component Value Date/Time   CHOL 325* 04/17/2014 1210   CHOL 206* 05/15/2013 1631   CHOL 228* 05/17/2012 1134   TRIG 91 04/17/2014 1210   TRIG 163* 05/15/2013 1631   TRIG 88 05/17/2012 1134   HDL 74 04/17/2014 1210   HDL 47 05/15/2013 1631   HDL 55 05/17/2012 1134   CHOLHDL 4.4 05/15/2013 1631   CHD/CHF Risk Equivalents:  none [place] Estd 45yrs risk:  20.9% NCEP Risk Factors Present:  HTN and age Primary Problem(s):  LDL or LDL-P elevated  Current NCEP Goals: LDL Goal < 100 HDL Goal >/= 45 Tg Goal < 150 Non-HDL Goal < 130  Secondary cause of hyperlipidemia present:  none Low fat diet followed?  No - patient has been eating sausage almost daily.  Also eats a lot of red meat and other animal products Low carb diet followed?  No -   Exercise?  No -     Assessment:  Hyperlipidemia - elevation of LDL with history of statin intolerance  Recommendations: 1.  Reviewed ASCVD risk with patient and recommendation for statin therapy.  She refuses to take statin again.  2.  Restart Zetia 10mg  1 tablet daily  3.  Trial of coenzyem Q 10 100 to 200mg  daily for muscle pain. 4.  Discussed Meditearrean Diet.   Specifically she is to stop or greatly limit sausage intake.  Discussed increasing whole grains and vegetables / fruits.  Increase proteins sources such as nuts, seeds and beans. 5.  Increase exercise to 30 minutes daily at least 5 days per week 6.  Recheck Lipid Panel:  2 months  Time spent counseling patient:  34minutes   Referring Provider:  Laurance Flatten PharmD:  Cherre Robins, PHARMD

## 2014-05-02 NOTE — Patient Instructions (Signed)
Co-Enzyme Q10 oral dosage forms What is this medicine? CO-ENZYME Q10 (koh EN zahym Q10) is an herbal or dietary supplement. It is promoted to help many disorders including congestive heart failure, muscle pain related to statin medications, certain mitochondrial diseases, migraine, and high blood pressure. The FDA has not approved this supplement for any medical use. This supplement may be used for other purposes; ask your health care provider or pharmacist if you have questions. This medicine may be used for other purposes; ask your health care provider or pharmacist if you have questions. COMMON BRAND NAME(S): Q-Sorb Co Q-10, QuinZyme What should I tell my health care provider before I take this medicine? They need to know if you have any of these conditions: -an unusual or allergic reaction to co-enzyme Q10, other herbs, plants, medicines, foods, dyes, or preservatives -pregnant or trying to get pregnant -breast-feeding How should I use this medicine?  Take 100mg  to 200mg  daily.  Take this medicine by mouth with a glass of water. Follow the directions on the package labeling, or take as directed by your health care professional. You should take this medicine with a high-fat meal. Do not take this medicine more often than directed. Contact your pediatrician regarding the use of this medicine in children. Special care may be needed. Overdosage: If you think you've taken too much of this medicine contact a poison control center or emergency room at once. Overdosage: If you think you have taken too much of this medicine contact a poison control center or emergency room at once. NOTE: This medicine is only for you. Do not share this medicine with others. What if I miss a dose? If you miss a dose, take it as soon as you can. If it is almost time for your next dose, take only that dose. Do not take double or extra doses. What may interact with this medicine? -medicines for blood  pressure -warfarin This list may not describe all possible interactions. Give your health care provider a list of all the medicines, herbs, non-prescription drugs, or dietary supplements you use. Also tell them if you smoke, drink alcohol, or use illegal drugs. Some items may interact with your medicine. What should I watch for while using this medicine? See your doctor if your symptoms do not get better or if they get worse. Herbal or dietary supplements are not regulated like medicines. Rigid quality control standards are not required for dietary supplements. The purity and strength of these products can vary. The safety and effect of this dietary supplement for a certain disease or illness is not well known. This product is not intended to diagnose, treat, cure or prevent any disease. The Food and Drug Administration suggests the following to help consumers protect themselves: -Always read product labels and follow directions. -Natural does not mean a product is safe for humans to take. -Look for products that include USP after the ingredient name. This means that the manufacturer followed the standards of the [place] Pharmacopoeia. -Supplements made or sold by a nationally known food or drug company are more likely to be made under tight controls. You can write to the company for more information about how the product was made. What side effects may I notice from receiving this medicine? Side effects that you should report to your doctor or health care professional as soon as possible: - allergic reactions like skin rash, itching or hives, swelling of the face, lips, or tongue Side effects that usually do not require medical attention (  Report these to your doctor or health care professional if they continue or are bothersome.): -diarrhea -loss of appetite -nausea -trouble sleeping -upset stomach This list may not describe all possible side effects. Call your doctor for medical advice about side  effects. You may report side effects to FDA at 1-800-FDA-1088. Where should I keep my medicine? Keep out of the reach of children. Store at room temperature or as directed on the package label. Throw away any unused medicine after the expiration date. NOTE: This sheet is a summary. It may not cover all possible information. If you have questions about this medicine, talk to your doctor, pharmacist, or health care provider.  2015, Elsevier/Gold Standard. (2013-04-23 11:11:51)

## 2014-05-07 ENCOUNTER — Telehealth: Payer: Self-pay | Admitting: Nurse Practitioner

## 2014-05-08 NOTE — Telephone Encounter (Signed)
Patient aware we are out samples.

## 2014-05-20 ENCOUNTER — Telehealth: Payer: Self-pay | Admitting: Family Medicine

## 2014-05-20 NOTE — Telephone Encounter (Signed)
Detailed message left that we do not have any samples of zetia.

## 2014-05-30 ENCOUNTER — Ambulatory Visit (INDEPENDENT_AMBULATORY_CARE_PROVIDER_SITE_OTHER): Payer: Medicare Other | Admitting: Family Medicine

## 2014-05-30 ENCOUNTER — Ambulatory Visit (INDEPENDENT_AMBULATORY_CARE_PROVIDER_SITE_OTHER): Payer: Medicare Other

## 2014-05-30 ENCOUNTER — Encounter: Payer: Self-pay | Admitting: Family Medicine

## 2014-05-30 VITALS — BP 140/77 | HR 84 | Temp 97.4°F | Ht 70.0 in | Wt 175.0 lb

## 2014-05-30 DIAGNOSIS — R2232 Localized swelling, mass and lump, left upper limb: Secondary | ICD-10-CM | POA: Diagnosis not present

## 2014-05-30 DIAGNOSIS — M79622 Pain in left upper arm: Secondary | ICD-10-CM

## 2014-05-30 NOTE — Progress Notes (Signed)
Subjective:    Patient ID: [name], female    DOB: [birth year], 74 y.o.   MRN: [id number]  HPI Patient here today for left upper arm knot and soreness. She has history of left side breast cancer. The patient was [place] a [place] to [place] last week and noticed a knot over her left deltoid muscle fall on the trip. She does not recall any injury. She does have a remote history of breast cancer. She is due to get her mammogram very soon.         Patient Active Problem List   Diagnosis Date Noted  . 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 Polyneuropathy in other diseases classified elsewhere 09/24/2013  . Memory disorder 09/24/2013  . Sleep apnea 05/15/2013  . Hypothyroid 05/15/2013  . HTN (hypertension) 05/15/2013  . Vitamin D deficiency 05/15/2013  . Hyperlipidemia 05/15/2013  . Osteopenia 10/25/2012  . Mild cognitive impairment with memory loss 09/27/2012  . Trigeminal neuralgia 04/02/2011  . Dysphagia 07/23/2010  . GERD (gastroesophageal reflux disease) 07/23/2010  . Right facial pain 07/23/2010  . ANXIETY 05/24/2008  . COPD 05/24/2008  . CONSTIPATION 05/24/2008  . ARTHRITIS 05/24/2008  . Idiopathic osteoporosis 05/24/2008  . ADENOCARCINOMA, BREAST 05/22/2008  . INTERNAL HEMORRHOIDS 05/22/2008  . COLONIC POLYPS, ADENOMATOUS, HX OF 05/22/2008   Outpatient Encounter Prescriptions as of 05/30/2014  Medication Sig  . aspirin 81 MG tablet Take 81 mg by mouth daily.    . Calcium Carbonate-Vitamin D (CALCIUM 500/D) 500-125 MG-UNIT TABS Take 1 capsule by mouth daily.    . Cholecalciferol (VITAMIN D3) 2000 UNITS capsule Take 4,000 Units by mouth daily.   Marland Kitchen donepezil (ARICEPT) 10 MG tablet Take 10 mg by mouth daily.  Marland Kitchen ezetimibe (ZETIA) 10 MG tablet Take 1 tablet (10 mg total) by mouth daily.  Marland Kitchen gabapentin (NEURONTIN) 300 MG capsule Take 300-600 mg by mouth 2 (two) times daily. Takes 1 tab in am and 2 tabs at night  . ibuprofen  (ADVIL,MOTRIN) 200 MG tablet Take 400 mg by mouth every 6 (six) hours as needed (Pain).   Marland Kitchen lansoprazole (PREVACID) 15 MG capsule Take 15 mg by mouth as needed. For heartburn  . levothyroxine (SYNTHROID, LEVOTHROID) 125 MCG tablet TAKE (1) TABLET BY MOUTH DAILY. (Patient taking differently: TAKE (1) TABLET BY MOUTH DAILY Monday through Saturday and 1/2 tablet on Sundays.)  . lisinopril-hydrochlorothiazide (PRINZIDE,ZESTORETIC) 10-12.5 MG per tablet TAKE (1) TABLET BY MOUTH DAILY.  . Multiple Vitamin (MULTIVITAMIN) capsule Take 1 capsule by mouth daily.    . nortriptyline (PAMELOR) 25 MG capsule Take 3 capsules by mouth at bedtime and may repeat dose one time if needed.  . Omega-3 Fatty Acids (FISH OIL) 1000 MG CAPS Take 1 capsule by mouth daily.    Marland Kitchen senna-docusate (SENOKOT S) 8.6-50 MG per tablet Take 1 tablet by mouth daily as needed for constipation.   Facility-Administered Encounter Medications as of 05/30/2014  Medication  . cyanocobalamin ((VITAMIN B-12)) injection 1,000 mcg     Review of Systems  Constitutional: Negative.   HENT: Negative.   Eyes: Negative.   Respiratory: Negative.   Cardiovascular: Negative.   Gastrointestinal: Negative.   Endocrine: Negative.   Genitourinary: Negative.   Musculoskeletal: Negative.   Skin: Positive for color change (left lower leg- bruising).       Knot and soreness of left upper arm  Allergic/Immunologic: Negative.   Neurological: Negative.   Hematological: Negative.   Psychiatric/Behavioral: Negative.  Objective:   Physical Exam  Constitutional: She is oriented to person, place, and time. She appears well-developed and well-nourished. No distress.  Genitourinary:  She has a left mastectomy. Both breasts and chest wall were examined today and there was no axillary mass or chest wall mass on the left and there was no problems with checking the right breast.  Musculoskeletal: Normal range of motion. She exhibits tenderness. She  exhibits no edema.  There is slight tenderness over the subcutaneous lump over the left deltoid muscle that is about the size of a penny or less.  Neurological: She is alert and oriented to person, place, and time.  Skin: Skin is warm. No rash noted. No erythema. No pallor.  Psychiatric: She has a normal mood and affect. Her behavior is normal. Judgment and thought content normal.  Nursing note and vitals reviewed.  BP 140/77 mmHg  Pulse 84  Temp(Src) 97.4 F (36.3 C) (Oral)  Ht 5\' 10"  (1.778 m)  Wt 175 lb (79.379 kg)  BMI 25.11 kg/m2        Assessment & Plan:  1. Left upper arm pain - DG Humerus Left; Future  2. Skin lump of arm, left -This small movable 1 the size of a dime our last appears to be just under the skin over the left deltoid muscle. -Reassure patient and use warm wet compresses 20 minutes 3 or 4 times daily for the next 7-10 days -Call us if there is no improvement -Get mammogram as planned  Patient Instructions   Apply warm wet compresses over the area of the left upper arm 20 minutes 3 or 4 times daily  Recheck in 3-4 weeks if not improved  Get mammogram as planned   Arrie Senate MD

## 2014-05-30 NOTE — Patient Instructions (Signed)
Apply warm wet compresses over the area of the left upper arm 20 minutes 3 or 4 times daily  Recheck in 3-4 weeks if not improved  Get mammogram as planned

## 2014-06-04 ENCOUNTER — Telehealth: Payer: Self-pay | Admitting: Family Medicine

## 2014-06-04 DIAGNOSIS — R2232 Localized swelling, mass and lump, left upper limb: Secondary | ICD-10-CM

## 2014-06-04 NOTE — Telephone Encounter (Signed)
Patient aware that per Dr. Laurance Flatten we are going to go ahead and do a MRI today. Order placed and referral aware.

## 2014-06-07 ENCOUNTER — Ambulatory Visit (INDEPENDENT_AMBULATORY_CARE_PROVIDER_SITE_OTHER): Payer: Medicare Other | Admitting: Pharmacist

## 2014-06-07 ENCOUNTER — Encounter: Payer: Self-pay | Admitting: Pharmacist

## 2014-06-07 VITALS — BP 120/70 | HR 75 | Ht 67.0 in | Wt 174.0 lb

## 2014-06-07 DIAGNOSIS — E890 Postprocedural hypothyroidism: Secondary | ICD-10-CM

## 2014-06-07 DIAGNOSIS — Z0000 Encounter for general adult medical examination without abnormal findings: Secondary | ICD-10-CM | POA: Diagnosis not present

## 2014-06-07 DIAGNOSIS — Z23 Encounter for immunization: Secondary | ICD-10-CM | POA: Diagnosis not present

## 2014-06-07 DIAGNOSIS — E785 Hyperlipidemia, unspecified: Secondary | ICD-10-CM

## 2014-06-07 DIAGNOSIS — Z780 Asymptomatic menopausal state: Secondary | ICD-10-CM

## 2014-06-07 DIAGNOSIS — E538 Deficiency of other specified B group vitamins: Secondary | ICD-10-CM | POA: Diagnosis not present

## 2014-06-07 DIAGNOSIS — M8580 Other specified disorders of bone density and structure, unspecified site: Secondary | ICD-10-CM

## 2014-06-07 NOTE — Progress Notes (Signed)
Patient ID: [name], female   DOB: 08/13/1940, 74 y.o.   MRN: [id number]    Subjective:   [name] is a 74 y.o. female who presents for an Initial Medicare Annual Wellness Visit and recheck lipids.   [name] was last seen by myself 05/02/2014 for hyperlipidemia.  She has been unable to tolerate statins in the past.  She restarted Zetia at that last appt and we discussed several dietary changes to help decrease LDL.  CHD/CHF Risk Equivalents: none AHA Estd 61yrs risk: 20.9% NCEP Risk Factors Present: HTN and age Primary Problem(s): LDL or LDL-P elevated  Current Medications (verified) Outpatient Encounter Prescriptions as of 06/07/2014  Medication Sig  . aspirin 81 MG tablet Take 81 mg by mouth daily.    . Calcium Carbonate-Vitamin D (CALCIUM 500/D) 500-125 MG-UNIT TABS Take 1 capsule by mouth daily.    . Cholecalciferol (VITAMIN D3) 2000 UNITS capsule Take 4,000 Units by mouth daily.   Marland Kitchen donepezil (ARICEPT) 10 MG tablet Take 10 mg by mouth daily.  Marland Kitchen ezetimibe (ZETIA) 10 MG tablet Take 1 tablet (10 mg total) by mouth daily.  Marland Kitchen gabapentin (NEURONTIN) 300 MG capsule Take 300-600 mg by mouth 2 (two) times daily. Takes 1 tab in am and 2 tabs at night  . ibuprofen (ADVIL,MOTRIN) 200 MG tablet Take 400 mg by mouth every 6 (six) hours as needed (Pain).   Marland Kitchen levothyroxine (SYNTHROID, LEVOTHROID) 125 MCG tablet TAKE (1) TABLET BY MOUTH DAILY. (Patient taking differently: TAKE (1) TABLET BY MOUTH DAILY Monday through Saturday and none on Sundays.)  . lisinopril-hydrochlorothiazide (PRINZIDE,ZESTORETIC) 10-12.5 MG per tablet TAKE (1) TABLET BY MOUTH DAILY.  . Multiple Vitamin (MULTIVITAMIN) capsule Take 1 capsule by mouth daily.    . nortriptyline (PAMELOR) 25 MG capsule Take 3 capsules by mouth at bedtime and may repeat dose one time if needed.  . Omega-3 Fatty Acids (FISH OIL) 1000 MG CAPS Take 1 capsule by mouth daily.    Marland Kitchen senna-docusate (SENOKOT S) 8.6-50 MG per tablet Take 1 tablet  by mouth daily as needed for constipation.  . lansoprazole (PREVACID) 15 MG capsule Take 15 mg by mouth as needed. For heartburn  . [DISCONTINUED] cyanocobalamin ((VITAMIN B-12)) injection 1,000 mcg    No facility-administered encounter medications on file as of 06/07/2014.    Allergies (verified) Atorvastatin; Meperidine hcl; Statins; and Sulfonamide derivatives   History: Past Medical History  Diagnosis Date  . Arthropathy, unspecified, site unspecified   . Anxiety state, unspecified   . Headache(784.0)   . Other and unspecified hyperlipidemia   . Unspecified hypothyroidism   . Personal history of colonic polyps     adenomatous  . Internal hemorrhoids without mention of complication   . Other dysphagia   . Esophageal reflux   . Pain in joint, multiple sites   . Anal or rectal pain   . Shingles   . Hiatal hernia   . Esophageal stricture   . Polyneuropathy in other diseases classified elsewhere 09/24/2013  . Memory disorder 09/24/2013  . Carpal tunnel syndrome     Bilateral  . Hypertension   . Sleep apnea     cpap   . Chronic airway obstruction, not elsewhere classified     patient denies on 02/27/14  . Arthritis   . Malignant neoplasm of breast (female), unspecified site     left breast   . Cataract   . Osteopenia    Past Surgical History  Procedure Laterality Date  . Back surgery  X2  . Mastectomy      left  . Breast lumpectomy    . Thyroidectomy    . Tubal ligation    . Laparoscopic appendectomy N/A 02/28/2014    Procedure: APPENDECTOMY LAPAROSCOPIC PARTIAL CECECTOMY FOR MUCOCELE OF APPENDIX ;  Surgeon: Michael Boston, MD;  Location: [facility];  Service: General;  Laterality: N/A;  . Eye surgery    . Appendectomy  03/2014   Family History  Problem Relation Age of Onset  . Bipolar disorder Sister   . Colon cancer Neg Hx   . Esophageal cancer Neg Hx   . Rectal cancer Neg Hx   . Stomach cancer Neg Hx   . Early death Mother   . Early death Sister    Social  History   Occupational History  . Retired    Social History Main Topics  . Smoking status: Never Smoker   . Smokeless tobacco: Never Used  . Alcohol Use: No  . Drug Use: No  . Sexual Activity: No   Do you feel safe at home?  Yes  Dietary issues and exercise activities: Current Exercise Habits:: Home exercise routine, Type of exercise: walking, Time (Minutes): 60, Frequency (Times/Week): 4, Weekly Exercise (Minutes/Week): 240, Intensity: Moderate  Current Dietary habits:  She has not had sausage for the last 3 months which she use to eat on a regular basis.  She is trying to increase good fats - fish and olive oil - and limit bad fats - animal fats.   Objective:    Today's Vitals   06/07/14 0839  BP: 120/70  Pulse: 75  Height: 5\' 7"  (1.702 m)  Weight: 174 lb (78.926 kg)  PainSc: 2   PainLoc: Hip   Body mass index is 27.25 kg/(m^2).  Activities of Daily Living In your present state of health, do you have any difficulty performing the following activities: 06/07/2014 02/27/2014  Hearing? N N  Vision? N N  Difficulty concentrating or making decisions? Y N  Walking or climbing stairs? N Y  Dressing or bathing? N N  Doing errands, shopping? N N  Preparing Food and eating ? N -  Using the Toilet? N -  In the past six months, have you accidently leaked urine? Y - she thinks this is related to HCTZ -  Do you have problems with loss of bowel control? N -  Managing your Medications? N -  Managing your Finances? N -  Housekeeping or managing your Housekeeping? N -   Are there smokers in your home (other than you)? No   Cardiac Risk Factors include: advanced age (>24men, >28 women);dyslipidemia;hypertension  Depression Screen PHQ 2/9 Scores 06/07/2014 04/17/2014 07/09/2013 10/24/2012  PHQ - 2 Score 0 0 1 0    Fall Risk Fall Risk  06/07/2014 04/17/2014 07/09/2013 10/24/2012  Falls in the past year? No No No No    Cognitive Function: MMSE - Mini Mental State Exam 06/07/2014    Orientation to time 4  Orientation to Place 5  Registration 3  Attention/ Calculation 5  Recall 3  Language- name 2 objects 2  Language- repeat 1  Language- follow 3 step command 2  Language- read & follow direction 1  Write a sentence 1  Copy design 1  Total score 28    Immunizations and Health Maintenance Immunization History  Administered Date(s) Administered  . Influenza,inj,Quad PF,36+ Mos 10/24/2012, 10/29/2013  . Pneumococcal Conjugate-13 01/11/2013, 01/11/2013  . Pneumococcal Polysaccharide-23 06/07/2014   There are no preventive care  reminders to display for this patient.  Patient Care Team: Chipper Herb, MD as PCP - General (Family Medicine) Boyd Kerbs, MD (Specialist) Chauncey Cruel, MD as Consulting Physician (Oncology) Clent Jacks, MD as Consulting Physician (Ophthalmology) Kathrynn Ducking, MD as Consulting Physician (Neurology)  Indicate any recent Medical Services you may have received from other than [facility] providers in the past year (date may be approximate).    Assessment:    Annual Wellness Visit  Hyperlipidemia Hypothyroidism - post thyroidectomy Medication management - patient would like to try to decrease some medications if possible.   Urinary leaking   Screening Tests Health Maintenance  Topic Date Due  . TETANUS/TDAP  07/17/2014 (Originally 05/05/2007)  . PNA vac Low Risk Adult (2 of 2 - PPSV23) 10/17/2014 (Originally 01/11/2014)  . INFLUENZA VACCINE  08/05/2014  . DEXA SCAN  10/05/2014  . PAP SMEAR  10/25/2014  . COLON CANCER SCREENING ANNUAL FOBT  02/13/2015  . MAMMOGRAM  06/05/2015  . COLONOSCOPY  02/13/2019  . ZOSTAVAX  Completed        Plan:   During the course of the visit [facility] was educated and counseled about the following appropriate screening and preventive services:   Vaccines to include Pneumoccal, Influenza, Hepatitis B, Td, Zostavax - Pneumo 23 given in office today.  All other vaccines UTD  Colorectal  cancer screening - colonoscopy UTD, FOBT given in office today  Cardiovascular disease screening - checking LDL today.  BP was at goal today  Diabetes screening - UTD  Bone Denisty / Osteoporosis Screening - due 10/26/2014 or after - sending order today  Mammogram - due - patient has appt in next month at [facility]  Glaucoma screening / Eye Exam - UTD  Nutrition counseling - Re-enforced recommendations and Mediterranean Diet discussed at last appt.  Patient is adherent.  Discussed changing lisinopril HCTZ to just lisinopril which might help with bladder concerns.  Would give patient Rx for HCTZ to use as needed for swelling.  She would like to think about change and will let me know .   Patient to try decreasing nortriptyline to 2 capsules at bedtime for 1 week and then 1 capsule at bedtime  Patient to try to decrease gabapentin to 1 capsule BID.  Advanced Directives - packet reviewed and given to patient  Continue walking daily  . Orders Placed This Encounter  Procedures  . Pneumococcal polysaccharide vaccine 23-valent greater than or equal to 2yo subcutaneous/IM  . Thyroid Panel With TSH  . Lipid panel  . LDL cholesterol, direct  . Anemia Profile B    Patient Instructions (the written plan) were given to the patient.   Cherre Robins, [place], CPP, CDE   06/07/2014

## 2014-06-07 NOTE — Patient Instructions (Signed)
  [name] , Thank you for taking time to come for your Medicare Wellness Visit. I appreciate your ongoing commitment to your health goals. Please review the following plan we discussed and let me know if I can assist you in the future.   Try to decrease nortriptyline to 2 capsules at night for 1 week.  Then to 1 capsule at night if able.  Try to decrease gabapentin to 2 capsules as well.  If you decide you want to separate out lisniopril/HCTZ and have HCTZ - fluid pill separate so you could not take fluid pill on days when you will be away from home please call me.   Continue walking daily and limiting high fat foods.    Increase non-starchy vegetables - carrots, green bean, squash, zucchini, tomatoes, onions, peppers, spinach and other green leafy vegetables, cabbage, lettuce, cucumbers, asparagus, okra (not fried), eggplant limit sugar and processed foods (cakes, cookies, ice cream, crackers and chips) Increase fresh fruit but limit serving sizes 1/2 cup or about the size of tennis or baseball limit red meat to no more than 1-2 times per week (serving size about the size of your palm) Choose whole grains / lean proteins - whole wheat bread, quinoa, whole grain rice (1/2 cup), fish, chicken, [place]  This is a list of the screening recommended for you and due dates:  Health Maintenance  Topic Date Due  . Tetanus Vaccine  Due now - checked cost today $45  . Pneumonia vaccines (2 of 2 - PPSV23) Completed today  . Flu Shot  08/05/2014  . Pap Smear  10/25/2014  . Stool Blood Test  02/13/2015  . Mammogram  06/05/2014  . Colon Cancer Screening  02/13/2019  . DEXA scan (bone density measurement)  Completed  . Shingles Vaccine  Completed  *Topic was postponed. The date shown is not the original due date.

## 2014-06-08 LAB — LIPID PANEL
CHOLESTEROL TOTAL: 226 mg/dL — AB (ref 100–199)
Chol/HDL Ratio: 4.5 ratio units — ABNORMAL HIGH (ref 0.0–4.4)
HDL: 50 mg/dL (ref >39)
LDL CALC: 154 mg/dL — AB (ref 0–99)
TRIGLYCERIDES: 112 mg/dL (ref 0–149)
VLDL Cholesterol Cal: 22 mg/dL (ref 5–40)

## 2014-06-08 LAB — ANEMIA PROFILE B
Basophils Absolute: 0 10*3/uL (ref 0.0–0.2)
Basos: 1 %
EOS (ABSOLUTE): 0.2 10*3/uL (ref 0.0–0.4)
EOS: 4 %
FERRITIN: 98 ng/mL (ref 15–150)
HEMATOCRIT: 37.9 % (ref 34.0–46.6)
Hemoglobin: 12.5 g/dL (ref 11.1–15.9)
Immature Grans (Abs): 0 10*3/uL (ref 0.0–0.1)
Immature Granulocytes: 0 %
Iron Saturation: 27 % (ref 15–55)
Iron: 73 ug/dL (ref 27–139)
LYMPHS: 34 %
Lymphocytes Absolute: 1.6 10*3/uL (ref 0.7–3.1)
MCH: 30.3 pg (ref 26.6–33.0)
MCHC: 33 g/dL (ref 31.5–35.7)
MCV: 92 fL (ref 79–97)
Monocytes Absolute: 0.4 10*3/uL (ref 0.1–0.9)
Monocytes: 8 %
NEUTROS ABS: 2.5 10*3/uL (ref 1.4–7.0)
Neutrophils: 53 %
Platelets: 250 10*3/uL (ref 150–379)
RBC: 4.12 x10E6/uL (ref 3.77–5.28)
RDW: 14.9 % (ref 12.3–15.4)
RETIC CT PCT: 1.2 % (ref 0.6–2.6)
Total Iron Binding Capacity: 267 ug/dL (ref 250–450)
UIBC: 194 ug/dL (ref 118–369)
Vitamin B-12: 301 pg/mL (ref 211–946)
WBC: 4.7 10*3/uL (ref 3.4–10.8)

## 2014-06-08 LAB — THYROID PANEL WITH TSH
Free Thyroxine Index: 3.7 (ref 1.2–4.9)
T3 Uptake Ratio: 30 % (ref 24–39)
T4 TOTAL: 12.3 ug/dL — AB (ref 4.5–12.0)
TSH: 0.22 u[IU]/mL — AB (ref 0.450–4.500)

## 2014-06-08 LAB — LDL CHOLESTEROL, DIRECT: LDL Direct: 164 mg/dL — ABNORMAL HIGH (ref 0–99)

## 2014-06-08 NOTE — Addendum Note (Signed)
Addended by: Cherre Robins on: 06/08/2014 12:16 AM   Modules accepted: Orders, Level of Service

## 2014-06-10 DIAGNOSIS — G603 Idiopathic progressive neuropathy: Secondary | ICD-10-CM | POA: Diagnosis not present

## 2014-06-10 DIAGNOSIS — M542 Cervicalgia: Secondary | ICD-10-CM | POA: Diagnosis not present

## 2014-06-10 DIAGNOSIS — G500 Trigeminal neuralgia: Secondary | ICD-10-CM | POA: Diagnosis not present

## 2014-06-10 DIAGNOSIS — M5412 Radiculopathy, cervical region: Secondary | ICD-10-CM | POA: Diagnosis not present

## 2014-06-10 DIAGNOSIS — M5417 Radiculopathy, lumbosacral region: Secondary | ICD-10-CM | POA: Diagnosis not present

## 2014-06-14 ENCOUNTER — Telehealth: Payer: Self-pay | Admitting: Pharmacist

## 2014-06-14 MED ORDER — SYNTHROID 100 MCG PO TABS: 100.0000 ug | ORAL_TABLET | Freq: Every day | ORAL | Status: DC

## 2014-06-14 NOTE — Telephone Encounter (Signed)
Anemia panel = WNL.  TSH is low - recommend decrease levothyroxine dose to 186mcg daily (she is current taking 121mcg 6 days weekly and none on sundays) LDL still not at goal but did decrease from 233 (04/2014) - NMR Lipomed to 154 today - patient is taking Zetia 10mg  daily and refuses to retry statin or other medications.  Patient notified of results.

## 2014-06-19 ENCOUNTER — Ambulatory Visit (HOSPITAL_COMMUNITY)
Admission: RE | Admit: 2014-06-19 | Discharge: 2014-06-19 | Disposition: A | Discharge status: Home / Self Care | Payer: Medicare Other | Source: Ambulatory Visit | Attending: Family Medicine | Admitting: Family Medicine

## 2014-06-19 DIAGNOSIS — R2232 Localized swelling, mass and lump, left upper limb: Secondary | ICD-10-CM

## 2014-06-25 ENCOUNTER — Other Ambulatory Visit: Payer: Self-pay | Admitting: Pharmacist

## 2014-07-25 ENCOUNTER — Encounter: Payer: Self-pay | Admitting: Gastroenterology

## 2014-07-25 ENCOUNTER — Ambulatory Visit: Payer: Medicare Other | Admitting: Adult Health

## 2014-08-08 ENCOUNTER — Ambulatory Visit (INDEPENDENT_AMBULATORY_CARE_PROVIDER_SITE_OTHER): Payer: Medicare Other | Admitting: Adult Health

## 2014-08-08 ENCOUNTER — Encounter: Payer: Self-pay | Admitting: Adult Health

## 2014-08-08 VITALS — BP 130/80 | HR 74 | Ht 67.0 in | Wt 168.0 lb

## 2014-08-08 DIAGNOSIS — R413 Other amnesia: Secondary | ICD-10-CM

## 2014-08-08 DIAGNOSIS — G609 Hereditary and idiopathic neuropathy, unspecified: Secondary | ICD-10-CM | POA: Diagnosis not present

## 2014-08-08 NOTE — Patient Instructions (Signed)
Continue gabapentin and nortriptyline for neuropathy Continue Aricept for memory Today your memory score was 30/30. If your symptoms worsen or you develop new symptoms please let us know.

## 2014-08-08 NOTE — Progress Notes (Signed)
I agree with the assessment and plan as directed by NP .The patient is known to me .   Kyrsten Deleeuw, MD  

## 2014-08-08 NOTE — Progress Notes (Signed)
PATIENT: [name] DOB: 20-Aug-1940  REASON FOR VISIT: follow up-  peripheral neuropathy and memory loss HISTORY FROM: patient  HISTORY OF PRESENT ILLNESS: [name] is a 74 year old female with a history of right-sided trigeminal neuralgia, peripheral neuropathy and memory loss. She returns today for follow-up. The patient continues to take gabapentin 300 mg in the a.m. and 600 mg in the PM. She also takes nortriptyline 75 mg in the PM. She reports that her neuropathy has remained stable. She states that the medications have controlled her discomfort. Her discomfort continues to extend to above the ankles. She states that she is sleeping well at night. She denies any changes with her memory. She continues to complete all ADLs independently. She operates a Teacher, music without difficulty. She denies any new neurological symptoms. She returns today for an evaluation.  HISTORY 01/24/14: [name] is a 74 year old female with a history of right-sided trigeminal neuralgia, peripheral neuropathy and memory loss. She returns today for follow-up. The patient is currently taking gabapentin 300 mg in a.m. and 600 mg in the p.m. as well as nortriptyline 75 mg at bedtime. She reports that this controls her discomfort. Peripheral neuropathy is located in the feet and extends to above the ankle. She reports burning and tingling- more noticeable at night. She does feel the medication is helping to control the discomfort. The patient reports that her memory has remained stable. She continues to take Aricept 10 mg daily. She is able to complete all ADLs independently. She lives at home alone. She is able to operate a motor vehicle without difficulty. Denies anything due to her memory.  HISTORY 11/06/13 [name]): [name] is a 74 year old right-handed white female with a history of right sided trigeminal neuralgia involving the V2 and V1 distributions. The patient indicates that she began having right ear  discomfort four years ago, but the actual neuralgia pain into the face did not occur until 2013. The patient began having pain into the maxillary area, and pain into the right forehead. The patient describes discomfort as an electrical shock sensation. The patient believes that there is some numbness and tingling on the right face as well. The patient denies any difficulty with double vision, loss of vision, difficulty with chewing or swallowing. The patient has been followed by Dr. Rosezella [place] from neurology, and the patient is been treated with nortriptyline and gabapentin with good success. The patient has been told she has a mild peripheral neuropathy, but she also has chronic neck and low back pain. The patient has been receiving trigger point injections, but she indicates that these injections oftentimes cause headaches for her. The patient tries to stretch on a regular basis. The patient also indicates that she has some issues with memory. The patient has been on Aricept. The patient recently has noted that her voice is changing, becoming somewhat raspy. The patient is on Prevacid in low dose, but she does not take this on a regular basis. The patient comes to this office for an evaluation. The patient indicates that she also has a history of bilateral carpal tunnel syndrome. She has had nerve conduction studies in the past, she has had a CAT scan of the brain, but she has never had MRI evaluation of the brain.  REVIEW OF SYSTEMS: Out of a complete 14 system review of symptoms, the patient complains only of the following symptoms, and all other reviewed systems are negative.  Back pain, restless leg ALLERGIES: Allergies  Allergen Reactions  .  Atorvastatin Other (See Comments)    Muscle cramps  . Meperidine Hcl Nausea And Vomiting    Makes me 'deathly' sick  . Statins     REACTION: Patient cannot tolerate due cramps  . Sulfonamide Derivatives Other (See Comments)    Unknown reaction    HOME  MEDICATIONS: Outpatient Prescriptions Prior to Visit  Medication Sig Dispense Refill  . aspirin 81 MG tablet Take 81 mg by mouth daily.      . Calcium Carbonate-Vitamin D (CALCIUM 500/D) 500-125 MG-UNIT TABS Take 1 capsule by mouth daily.      . Cholecalciferol (VITAMIN D3) 2000 UNITS capsule Take 4,000 Units by mouth daily.     Marland Kitchen donepezil (ARICEPT) 10 MG tablet Take 10 mg by mouth daily.    Marland Kitchen gabapentin (NEURONTIN) 300 MG capsule Take 300-600 mg by mouth 2 (two) times daily. Takes 1 tab in am and 2 tabs at night    . ibuprofen (ADVIL,MOTRIN) 200 MG tablet Take 400 mg by mouth every 6 (six) hours as needed (Pain).     Marland Kitchen lansoprazole (PREVACID) 15 MG capsule Take 15 mg by mouth as needed. For heartburn    . lisinopril-hydrochlorothiazide (PRINZIDE,ZESTORETIC) 10-12.5 MG per tablet TAKE (1) TABLET BY MOUTH DAILY. 30 tablet 5  . Multiple Vitamin (MULTIVITAMIN) capsule Take 1 capsule by mouth daily.      . nortriptyline (PAMELOR) 25 MG capsule Take 3 capsules by mouth at bedtime and may repeat dose one time if needed.    . Omega-3 Fatty Acids (FISH OIL) 1000 MG CAPS Take 1 capsule by mouth daily.      Marland Kitchen senna-docusate (SENOKOT S) 8.6-50 MG per tablet Take 1 tablet by mouth daily as needed for constipation.    Marland Kitchen SYNTHROID 100 MCG tablet Take 1 tablet (100 mcg total) by mouth daily before breakfast. 90 tablet 0  . ZETIA 10 MG tablet TAKE (1) TABLET BY MOUTH DAILY. 30 tablet 5   No facility-administered medications prior to visit.    PAST MEDICAL HISTORY: Past Medical History  Diagnosis Date  . Arthropathy, unspecified, site unspecified   . Anxiety state, unspecified   . Headache(784.0)   . Other and unspecified hyperlipidemia   . Unspecified hypothyroidism   . Personal history of colonic polyps     adenomatous  . Internal hemorrhoids without mention of complication   . Other dysphagia   . Esophageal reflux   . Pain in joint, multiple sites   . Anal or rectal pain   . Shingles   .  Hiatal hernia   . Esophageal stricture   . Polyneuropathy in other diseases classified elsewhere 09/24/2013  . Memory disorder 09/24/2013  . Carpal tunnel syndrome     Bilateral  . Hypertension   . Sleep apnea     cpap   . Chronic airway obstruction, not elsewhere classified     patient denies on 02/27/14  . Arthritis   . Malignant neoplasm of breast (female), unspecified site     left breast   . Cataract   . Osteopenia     PAST SURGICAL HISTORY: Past Surgical History  Procedure Laterality Date  . Back surgery      X2  . Mastectomy      left  . Breast lumpectomy    . Thyroidectomy    . Tubal ligation    . Laparoscopic appendectomy N/A 02/28/2014    Procedure: APPENDECTOMY LAPAROSCOPIC PARTIAL CECECTOMY FOR MUCOCELE OF APPENDIX ;  Surgeon: Michael Boston, MD;  Location:  [facility];  Service: General;  Laterality: N/A;  . Eye surgery    . Appendectomy  03/2014    FAMILY HISTORY: Family History  Problem Relation Age of Onset  . Bipolar disorder Sister   . Colon cancer Neg Hx   . Esophageal cancer Neg Hx   . Rectal cancer Neg Hx   . Stomach cancer Neg Hx   . Early death Mother   . Early death Sister     SOCIAL HISTORY: History   Social History  . Marital Status: Divorced    Spouse Name: N/A  . Number of Children: 2  . Years of Education: N/A   Occupational History  . Retired    Social History Main Topics  . Smoking status: Never Smoker   . Smokeless tobacco: Never Used  . Alcohol Use: No  . Drug Use: No  . Sexual Activity: No   Other Topics Concern  . Not on file   Social History Narrative   Patient lives at home alone and she is divorced.   Retired.   Education business course.   Right handed.   Caffeine sometimes tea.       PHYSICAL EXAM  Filed Vitals:   08/08/14 0757  BP: 130/80  Pulse: 74  Height: 5\' 7"  (1.702 m)  Weight: 168 lb (76.204 kg)   Body mass index is 26.31 kg/(m^2).  MMSE - Mini Mental State Exam 08/08/2014 06/07/2014  Orientation  to time 5 4  Orientation to Place 5 5  Registration 3 3  Attention/ Calculation 5 5  Recall 3 3  Language- name 2 objects 2 2  Language- repeat 1 1  Language- follow 3 step command 3 2  Language- read & follow direction 1 1  Write a sentence 1 1  Copy design 1 1  Total score 30 28    Generalized: Well developed, in no acute distress   Neurological examination  Mentation: Alert oriented to time, place, history taking. Follows all commands speech and language fluent. MMSE 30/30 Cranial nerve II-XII: Pupils were equal round reactive to light. Extraocular movements were full, visual field were full on confrontational test. Facial sensation and strength were normal. Uvula tongue midline. Head turning and shoulder shrug  were normal and symmetric. Motor: The motor testing reveals 5 over 5 strength of all 4 extremities. Good symmetric motor tone is noted throughout.  Sensory: Sensory testing is intact to soft touch on all 4 extremities. No evidence of extinction is noted.  Coordination: Cerebellar testing reveals good finger-nose-finger and heel-to-shin bilaterally.  Gait and station: Gait is normal. Tandem gait is slightly unsteady.. Romberg is negative. No drift is seen.  Reflexes: Deep tendon reflexes are symmetric and normal bilaterally.   DIAGNOSTIC DATA (LABS, IMAGING, TESTING) - I reviewed patient records, labs, notes, testing and imaging myself where available.  Lab Results  Component Value Date   WBC 4.7 06/07/2014   HGB 13.1 04/17/2014   HCT 37.9 06/07/2014   MCV 91.5 04/17/2014   PLT 252 02/27/2014      Component Value Date/Time   NA 144 04/17/2014 1210   NA 140 02/27/2014 1015   NA 144 04/23/2013 1549   K 4.3 04/17/2014 1210   K 3.5 04/23/2013 1549   CL 101 04/17/2014 1210   CO2 24 04/17/2014 1210   CO2 25 04/23/2013 1549   GLUCOSE 96 04/17/2014 1210   GLUCOSE 94 02/27/2014 1015   GLUCOSE 97 04/23/2013 1549   BUN 19 04/17/2014 1210  BUN 11 02/27/2014 1015    BUN 20.1 04/23/2013 1549   CREATININE 0.54* 04/17/2014 1210   CREATININE 0.7 04/23/2013 1549   CREATININE 0.50 07/10/2012 1056   CALCIUM 9.7 04/17/2014 1210   CALCIUM 10.3 04/23/2013 1549   PROT 6.8 04/17/2014 1210   PROT 6.8 04/23/2013 1549   PROT 6.9 07/10/2012 1056   ALBUMIN 4.1 04/23/2013 1549   ALBUMIN 4.6 07/10/2012 1056   AST 19 04/17/2014 1210   AST 31 04/23/2013 1549   ALT 25 04/17/2014 1210   ALT 32 04/23/2013 1549   ALKPHOS 72 04/17/2014 1210   ALKPHOS 74 04/23/2013 1549   BILITOT 0.4 04/17/2014 1210   BILITOT 0.8 09/28/2013 0823   BILITOT 0.53 04/23/2013 1549   GFRNONAA 94 04/17/2014 1210   GFRNONAA >89 07/10/2012 1056   GFRAA 108 04/17/2014 1210   GFRAA >89 07/10/2012 1056   Lab Results  Component Value Date   CHOL 226* 06/07/2014   HDL 50 06/07/2014   LDLCALC 154* 06/07/2014   LDLDIRECT 164* 06/07/2014   TRIG 112 06/07/2014   CHOLHDL 4.5* 06/07/2014   Lab Results  Component Value Date   HGBA1C 5.2 09/28/2013   Lab Results  Component Value Date   VITAMINB12 301 06/07/2014   Lab Results  Component Value Date   TSH 0.220* 06/07/2014      ASSESSMENT AND PLAN 74 y.o. year old female  has a past medical history of Arthropathy, unspecified, site unspecified; Anxiety state, unspecified; Headache(784.0); Other and unspecified hyperlipidemia; Unspecified hypothyroidism; Personal history of colonic polyps; Internal hemorrhoids without mention of complication; Other dysphagia; Esophageal reflux; Pain in joint, multiple sites; Anal or rectal pain; Shingles; Hiatal hernia; Esophageal stricture; Polyneuropathy in other diseases classified elsewhere (09/24/2013); Memory disorder (09/24/2013); Carpal tunnel syndrome; Hypertension; Sleep apnea; Chronic airway obstruction, not elsewhere classified; Arthritis; Malignant neoplasm of breast (female), unspecified site; Cataract; and Osteopenia. here with:  1. Peripheral neuropathy 2. Memory disturbance   Overall the  patient is doing well. She will continue on gabapentin and nortriptyline. The patient's memory is stable. Her MMSE today is 30/30. She will continue on Aricept. If the patient's symptoms worsen or she develops new symptoms she should let us know. Otherwise she will follow-up in 6 months or sooner if needed.    Leslie Givens, MSN, NP-C 08/08/2014, 8:07 AM [facility] [address], [place], [place] [address] [phone]  Note: This document was prepared with digital dictation and possible smart phrase technology. Any transcriptional errors that result from this process are unintentional.

## 2014-08-21 DIAGNOSIS — L659 Nonscarring hair loss, unspecified: Secondary | ICD-10-CM | POA: Diagnosis not present

## 2014-08-21 DIAGNOSIS — D225 Melanocytic nevi of trunk: Secondary | ICD-10-CM | POA: Diagnosis not present

## 2014-08-21 DIAGNOSIS — L219 Seborrheic dermatitis, unspecified: Secondary | ICD-10-CM | POA: Diagnosis not present

## 2014-08-28 ENCOUNTER — Encounter: Payer: Self-pay | Admitting: Family Medicine

## 2014-08-28 ENCOUNTER — Ambulatory Visit (INDEPENDENT_AMBULATORY_CARE_PROVIDER_SITE_OTHER): Payer: Medicare Other | Admitting: Family Medicine

## 2014-08-28 ENCOUNTER — Encounter (INDEPENDENT_AMBULATORY_CARE_PROVIDER_SITE_OTHER): Payer: Self-pay

## 2014-08-28 VITALS — BP 132/77 | HR 73 | Temp 97.2°F | Ht 67.0 in | Wt 171.0 lb

## 2014-08-28 DIAGNOSIS — G63 Polyneuropathy in diseases classified elsewhere: Secondary | ICD-10-CM

## 2014-08-28 DIAGNOSIS — E559 Vitamin D deficiency, unspecified: Secondary | ICD-10-CM | POA: Diagnosis not present

## 2014-08-28 DIAGNOSIS — K219 Gastro-esophageal reflux disease without esophagitis: Secondary | ICD-10-CM | POA: Diagnosis not present

## 2014-08-28 DIAGNOSIS — I10 Essential (primary) hypertension: Secondary | ICD-10-CM

## 2014-08-28 DIAGNOSIS — E890 Postprocedural hypothyroidism: Secondary | ICD-10-CM | POA: Diagnosis not present

## 2014-08-28 DIAGNOSIS — E785 Hyperlipidemia, unspecified: Secondary | ICD-10-CM

## 2014-08-28 DIAGNOSIS — Z789 Other specified health status: Secondary | ICD-10-CM

## 2014-08-28 DIAGNOSIS — Z889 Allergy status to unspecified drugs, medicaments and biological substances status: Secondary | ICD-10-CM

## 2014-08-28 NOTE — Patient Instructions (Addendum)
Medicare Annual Wellness Visit  [facility] and the medical providers at [facility] strive to bring you the best medical care.  In doing so we not only want to address your current medical conditions and concerns but also to detect new conditions early and prevent illness, disease and health-related problems.    Medicare offers a yearly Wellness Visit which allows our clinical staff to assess your need for preventative services including immunizations, lifestyle education, counseling to decrease risk of preventable diseases and screening for fall risk and other medical concerns.    This visit is provided free of charge (no copay) for all Medicare recipients. The clinical pharmacists at [facility] have begun to conduct these Wellness Visits which will also include a thorough review of all your medications.    As you primary medical provider recommend that you make an appointment for your Annual Wellness Visit if you have not done so already this year.  You may set up this appointment before you leave today or you may call back ([phone]) and schedule an appointment.  Please make sure when you call that you mention that you are scheduling your Annual Wellness Visit with the clinical pharmacist so that the appointment may be made for the proper length of time.     Continue current medications. Continue good therapeutic lifestyle changes which include good diet and exercise. Fall precautions discussed with patient. If an FOBT was given today- please return it to our front desk. If you are over 40 years old - you may need Prevnar 72 or the adult Pneumonia vaccine.  **Flu shots will be available soon--- please call and schedule a FLU-CLINIC appointment**  After your visit with [place] today you will receive a survey in the mail or online from [employer] regarding your care with [place]. Please take a moment to fill this out. Your feedback is  very important to [place] as you can help [place] better understand your patient needs as well as improve your experience and satisfaction. WE CARE ABOUT YOU!!!   **Please join [place] SEPT.22, 2016 from 5:00 to 7:00pm for our OPEN HOUSE! Come out and meet our NEW providers**  Get pelvic exam and mammogram as planned Follow-up regularly with the neurologist Be sure and get your eye exam every 2 years Return to clinic for fasting lab work Check with [place] periodically and we will give you samples of Zetia if we have any

## 2014-08-28 NOTE — Progress Notes (Signed)
Subjective:    Patient ID: [name], female    DOB: [birth year], 74 y.o.   MRN: [id number]  HPI Pt here for follow up and management of chronic medical problems which includes hypothyroid, hypertension and hyperlipidemia. She is taking medications regularly. The patient has no specific complaints today other than some questions about the medication said he appears she is followed regularly by the neurologist. She is due to get her mammogram and we'll get this scheduled. She also is due to return an FOBT and future orders for lab work will be put in for her to return for this. She does not need any refills.       Patient Active Problem List   Diagnosis Date Noted  . 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 Polyneuropathy in other diseases classified elsewhere 09/24/2013  . Memory disorder 09/24/2013  . Sleep apnea 05/15/2013  . Hypothyroidism, postsurgical 05/15/2013  . HTN (hypertension) 05/15/2013  . Vitamin D deficiency 05/15/2013  . Hyperlipidemia 05/15/2013  . Osteopenia 10/25/2012  . Mild cognitive impairment with memory loss 09/27/2012  . Trigeminal neuralgia 04/02/2011  . Dysphagia 07/23/2010  . GERD (gastroesophageal reflux disease) 07/23/2010  . Right facial pain 07/23/2010  . ANXIETY 05/24/2008  . COPD 05/24/2008  . CONSTIPATION 05/24/2008  . ARTHRITIS 05/24/2008  . Idiopathic osteoporosis 05/24/2008  . ADENOCARCINOMA, BREAST 05/22/2008  . INTERNAL HEMORRHOIDS 05/22/2008  . COLONIC POLYPS, ADENOMATOUS, HX OF 05/22/2008   Outpatient Encounter Prescriptions as of 08/28/2014  Medication Sig  . aspirin 81 MG tablet Take 81 mg by mouth daily.    . Calcium Carbonate-Vitamin D (CALCIUM 500/D) 500-125 MG-UNIT TABS Take 1 capsule by mouth daily.    . Cholecalciferol (VITAMIN D3) 2000 UNITS capsule Take 4,000 Units by mouth daily.   Marland Kitchen donepezil (ARICEPT) 10 MG tablet Take 10 mg by mouth daily.  Marland Kitchen gabapentin (NEURONTIN) 300 MG capsule  Take 300-600 mg by mouth 2 (two) times daily. Takes 1 tab in am and 2 tabs at night  . ibuprofen (ADVIL,MOTRIN) 200 MG tablet Take 400 mg by mouth every 6 (six) hours as needed (Pain).   Marland Kitchen lansoprazole (PREVACID) 15 MG capsule Take 15 mg by mouth as needed. For heartburn  . lisinopril-hydrochlorothiazide (PRINZIDE,ZESTORETIC) 10-12.5 MG per tablet TAKE (1) TABLET BY MOUTH DAILY.  . Multiple Vitamin (MULTIVITAMIN) capsule Take 1 capsule by mouth daily.    . nortriptyline (PAMELOR) 25 MG capsule Take 3 capsules by mouth at bedtime and may repeat dose one time if needed.  . Omega-3 Fatty Acids (FISH OIL) 1000 MG CAPS Take 1 capsule by mouth daily.    Marland Kitchen senna-docusate (SENOKOT S) 8.6-50 MG per tablet Take 1 tablet by mouth daily as needed for constipation.  Marland Kitchen SYNTHROID 100 MCG tablet Take 1 tablet (100 mcg total) by mouth daily before breakfast.  . ZETIA 10 MG tablet TAKE (1) TABLET BY MOUTH DAILY.   No facility-administered encounter medications on file as of 08/28/2014.     Review of Systems  Constitutional: Negative.   HENT: Negative.   Eyes: Negative.   Respiratory: Negative.   Cardiovascular: Negative.   Gastrointestinal: Negative.   Endocrine: Negative.   Genitourinary: Negative.   Musculoskeletal: Negative.   Skin: Negative.   Allergic/Immunologic: Negative.   Neurological: Negative.   Hematological: Negative.   Psychiatric/Behavioral: Negative.        Objective:   Physical Exam  Constitutional: She is oriented to person, place, and time. She appears well-developed and well-nourished.  No distress.  HENT:  Head: Normocephalic and atraumatic.  Right Ear: External ear normal.  Left Ear: External ear normal.  Nose: Nose normal.  Mouth/Throat: Oropharynx is clear and moist.  Eyes: Conjunctivae and EOM are normal. Pupils are equal, round, and reactive to light. Right eye exhibits no discharge. Left eye exhibits no discharge. No scleral icterus.  Neck: Normal range of motion.  Neck supple. No thyromegaly present.  Cardiovascular: Normal rate, regular rhythm, normal heart sounds and intact distal pulses.   No murmur heard. At 72/m  Pulmonary/Chest: Effort normal and breath sounds normal. No respiratory distress. She has no wheezes. She has no rales. She exhibits no tenderness.  Clear anteriorly and posteriorly  Abdominal: Soft. Bowel sounds are normal. She exhibits no mass. There is no tenderness. There is no rebound and no guarding.  No abdominal masses or organ enlargement or tenderness  Musculoskeletal: Normal range of motion. She exhibits no edema.  Lymphadenopathy:    She has no cervical adenopathy.  Neurological: She is alert and oriented to person, place, and time. She has normal reflexes. No cranial nerve deficit.  Skin: Skin is warm and dry. No rash noted. No erythema. No pallor.  Psychiatric: She has a normal mood and affect. Her behavior is normal. Judgment and thought content normal.  Nursing note and vitals reviewed.  BP 132/77 mmHg  Pulse 73  Temp(Src) 97.2 F (36.2 C) (Oral)  Ht _0  (1.702 m)  Wt 171 lb (77.565 kg)  BMI 26.78 kg/m2        Assessment & Plan:  1. Hyperlipidemia -The patient is statin intolerant and Zetia has worked for her although it is expensive and she will continue to take this as much as she can afford it. She will review the information about the new PCS K-9 inhibitors - CBC with Differential/Platelet; Future - Lipid panel; Future  2. Hypothyroidism, postsurgical -Continue with current thyroid replacement pending results of future lab work to be done - CBC with Differential/Platelet; Future  3. Essential hypertension -Blood pressure is good today and she will continue with current treatment - BMP8+EGFR; Future - CBC with Differential/Platelet; Future - Hepatic function panel; Future  4. Vitamin D deficiency -Continue with vitamin D replacement pending results of lab work - CBC with Differential/Platelet;  Future - Vit D  25 hydroxy (rtn osteoporosis monitoring); Future  5. Gastroesophageal reflux disease, esophagitis presence not specified -The patient today has no complaints with heartburn or reflux. She will continue with the Prevacid if she is doing and watching her diet. - CBC with Differential/Platelet; Future - Hepatic function panel; Future  6. Polyneuropathy in other diseases classified elsewhere -Continue follow-up with neurology  7. Statin intolerance -After lab work is returned we will discuss with her further about the PCS K-9 inhibitors and if she would be a candidate for these.  Patient Instructions                       Medicare Annual Wellness Visit  [facility] and the medical providers at [facility] strive to bring you the best medical care.  In doing so we not only want to address your current medical conditions and concerns but also to detect new conditions early and prevent illness, disease and health-related problems.    Medicare offers a yearly Wellness Visit which allows our clinical staff to assess your need for preventative services including immunizations, lifestyle education, counseling to decrease risk of preventable diseases and  screening for fall risk and other medical concerns.    This visit is provided free of charge (no copay) for all Medicare recipients. The clinical pharmacists at [facility] have begun to conduct these Wellness Visits which will also include a thorough review of all your medications.    As you primary medical provider recommend that you make an appointment for your Annual Wellness Visit if you have not done so already this year.  You may set up this appointment before you leave today or you may call back [phone]) and schedule an appointment.  Please make sure when you call that you mention that you are scheduling your Annual Wellness Visit with the clinical pharmacist so that the appointment  may be made for the proper length of time.     Continue current medications. Continue good therapeutic lifestyle changes which include good diet and exercise. Fall precautions discussed with patient. If an FOBT was given today- please return it to our front desk. If you are over 2 years old - you may need Prevnar 36 or the adult Pneumonia vaccine.  **Flu shots will be available soon--- please call and schedule a FLU-CLINIC appointment**  After your visit with [place] today you will receive a survey in the mail or online from [employer] regarding your care with [place]. Please take a moment to fill this out. Your feedback is very important to [place] as you can help [place] better understand your patient needs as well as improve your experience and satisfaction. WE CARE ABOUT YOU!!!   **Please join [place] SEPT.22, 2016 from 5:00 to 7:00pm for our OPEN HOUSE! Come out and meet our NEW providers**  Get pelvic exam and mammogram as planned Follow-up regularly with the neurologist Be sure and get your eye exam every 2 years Return to clinic for fasting lab work Check with [place] periodically and we will give you samples of Zetia if we have any   Arrie Senate MD

## 2014-09-16 ENCOUNTER — Other Ambulatory Visit: Payer: Self-pay | Admitting: Pharmacist

## 2014-09-17 ENCOUNTER — Other Ambulatory Visit: Payer: Self-pay

## 2014-09-17 DIAGNOSIS — Z1231 Encounter for screening mammogram for malignant neoplasm of breast: Secondary | ICD-10-CM

## 2014-09-23 ENCOUNTER — Other Ambulatory Visit (INDEPENDENT_AMBULATORY_CARE_PROVIDER_SITE_OTHER): Payer: Medicare Other

## 2014-09-23 DIAGNOSIS — E890 Postprocedural hypothyroidism: Secondary | ICD-10-CM

## 2014-09-23 DIAGNOSIS — I10 Essential (primary) hypertension: Secondary | ICD-10-CM

## 2014-09-23 DIAGNOSIS — E559 Vitamin D deficiency, unspecified: Secondary | ICD-10-CM | POA: Diagnosis not present

## 2014-09-23 DIAGNOSIS — E785 Hyperlipidemia, unspecified: Secondary | ICD-10-CM | POA: Diagnosis not present

## 2014-09-23 DIAGNOSIS — K219 Gastro-esophageal reflux disease without esophagitis: Secondary | ICD-10-CM | POA: Diagnosis not present

## 2014-09-23 NOTE — Progress Notes (Signed)
Lab only 

## 2014-09-24 LAB — HEPATIC FUNCTION PANEL
ALK PHOS: 68 IU/L (ref 39–117)
ALT: 18 IU/L (ref 0–32)
AST: 23 IU/L (ref 0–40)
Albumin: 4.5 g/dL (ref 3.5–4.8)
Bilirubin Total: 0.5 mg/dL (ref 0.0–1.2)
Bilirubin, Direct: 0.12 mg/dL (ref 0.00–0.40)
TOTAL PROTEIN: 6.4 g/dL (ref 6.0–8.5)

## 2014-09-24 LAB — BMP8+EGFR
BUN / CREAT RATIO: 15 (ref 11–26)
BUN: 9 mg/dL (ref 8–27)
CHLORIDE: 103 mmol/L (ref 97–108)
CO2: 25 mmol/L (ref 18–29)
Calcium: 9.4 mg/dL (ref 8.7–10.3)
Creatinine, Ser: 0.59 mg/dL (ref 0.57–1.00)
GFR, EST AFRICAN AMERICAN: 105 mL/min/{1.73_m2} (ref >59)
GFR, EST NON AFRICAN AMERICAN: 91 mL/min/{1.73_m2} (ref >59)
Glucose: 102 mg/dL — ABNORMAL HIGH (ref 65–99)
Potassium: 4.6 mmol/L (ref 3.5–5.2)
SODIUM: 143 mmol/L (ref 134–144)

## 2014-09-24 LAB — LIPID PANEL
CHOLESTEROL TOTAL: 248 mg/dL — AB (ref 100–199)
Chol/HDL Ratio: 4.3 ratio units (ref 0.0–4.4)
HDL: 58 mg/dL (ref >39)
LDL CALC: 171 mg/dL — AB (ref 0–99)
TRIGLYCERIDES: 97 mg/dL (ref 0–149)
VLDL Cholesterol Cal: 19 mg/dL (ref 5–40)

## 2014-09-24 LAB — CBC WITH DIFFERENTIAL/PLATELET
BASOS: 1 %
Basophils Absolute: 0 10*3/uL (ref 0.0–0.2)
EOS (ABSOLUTE): 0.3 10*3/uL (ref 0.0–0.4)
EOS: 6 %
HEMATOCRIT: 40.6 % (ref 34.0–46.6)
HEMOGLOBIN: 13.5 g/dL (ref 11.1–15.9)
IMMATURE GRANS (ABS): 0 10*3/uL (ref 0.0–0.1)
IMMATURE GRANULOCYTES: 0 %
LYMPHS: 37 %
Lymphocytes Absolute: 1.7 10*3/uL (ref 0.7–3.1)
MCH: 30.1 pg (ref 26.6–33.0)
MCHC: 33.3 g/dL (ref 31.5–35.7)
MCV: 90 fL (ref 79–97)
MONOCYTES: 7 %
MONOS ABS: 0.3 10*3/uL (ref 0.1–0.9)
NEUTROS PCT: 49 %
Neutrophils Absolute: 2.2 10*3/uL (ref 1.4–7.0)
Platelets: 223 10*3/uL (ref 150–379)
RBC: 4.49 x10E6/uL (ref 3.77–5.28)
RDW: 14.4 % (ref 12.3–15.4)
WBC: 4.5 10*3/uL (ref 3.4–10.8)

## 2014-09-24 LAB — VITAMIN D 25 HYDROXY (VIT D DEFICIENCY, FRACTURES): Vit D, 25-Hydroxy: 47.1 ng/mL (ref 30.0–100.0)

## 2014-09-26 ENCOUNTER — Telehealth: Payer: Self-pay | Admitting: Family Medicine

## 2014-09-30 DIAGNOSIS — G603 Idiopathic progressive neuropathy: Secondary | ICD-10-CM | POA: Diagnosis not present

## 2014-09-30 DIAGNOSIS — M5441 Lumbago with sciatica, right side: Secondary | ICD-10-CM | POA: Diagnosis not present

## 2014-09-30 DIAGNOSIS — M542 Cervicalgia: Secondary | ICD-10-CM | POA: Diagnosis not present

## 2014-09-30 DIAGNOSIS — M5442 Lumbago with sciatica, left side: Secondary | ICD-10-CM | POA: Diagnosis not present

## 2014-09-30 DIAGNOSIS — M5417 Radiculopathy, lumbosacral region: Secondary | ICD-10-CM | POA: Diagnosis not present

## 2014-10-03 ENCOUNTER — Encounter: Payer: Medicare Other | Admitting: Pharmacist

## 2014-10-03 NOTE — Telephone Encounter (Signed)
Patient spoke with nurse on the 23rd

## 2014-10-22 ENCOUNTER — Ambulatory Visit
Admission: RE | Admit: 2014-10-22 | Discharge: 2014-10-22 | Disposition: A | Discharge status: Home / Self Care | Payer: Medicare Other | Source: Ambulatory Visit

## 2014-10-22 DIAGNOSIS — Z1231 Encounter for screening mammogram for malignant neoplasm of breast: Secondary | ICD-10-CM | POA: Diagnosis not present

## 2014-11-06 ENCOUNTER — Other Ambulatory Visit: Payer: Medicare Other

## 2014-11-11 ENCOUNTER — Ambulatory Visit (INDEPENDENT_AMBULATORY_CARE_PROVIDER_SITE_OTHER): Payer: Medicare Other

## 2014-11-11 DIAGNOSIS — Z23 Encounter for immunization: Secondary | ICD-10-CM

## 2014-11-14 ENCOUNTER — Other Ambulatory Visit: Payer: Self-pay | Admitting: Pharmacist

## 2014-11-14 DIAGNOSIS — M8580 Other specified disorders of bone density and structure, unspecified site: Secondary | ICD-10-CM

## 2014-11-20 ENCOUNTER — Other Ambulatory Visit: Payer: Self-pay | Admitting: Family Medicine

## 2014-12-05 DIAGNOSIS — C50912 Malignant neoplasm of unspecified site of left female breast: Secondary | ICD-10-CM | POA: Diagnosis not present

## 2014-12-31 ENCOUNTER — Ambulatory Visit (INDEPENDENT_AMBULATORY_CARE_PROVIDER_SITE_OTHER): Payer: Medicare Other | Admitting: Family Medicine

## 2014-12-31 ENCOUNTER — Ambulatory Visit (INDEPENDENT_AMBULATORY_CARE_PROVIDER_SITE_OTHER): Payer: Medicare Other

## 2014-12-31 ENCOUNTER — Encounter: Payer: Self-pay | Admitting: Family Medicine

## 2014-12-31 VITALS — BP 132/80 | HR 70 | Temp 97.0°F | Ht 67.0 in | Wt 171.0 lb

## 2014-12-31 DIAGNOSIS — S90852A Superficial foreign body, left foot, initial encounter: Secondary | ICD-10-CM

## 2014-12-31 DIAGNOSIS — E538 Deficiency of other specified B group vitamins: Secondary | ICD-10-CM | POA: Diagnosis not present

## 2014-12-31 DIAGNOSIS — E559 Vitamin D deficiency, unspecified: Secondary | ICD-10-CM

## 2014-12-31 DIAGNOSIS — E890 Postprocedural hypothyroidism: Secondary | ICD-10-CM | POA: Diagnosis not present

## 2014-12-31 DIAGNOSIS — I10 Essential (primary) hypertension: Secondary | ICD-10-CM | POA: Diagnosis not present

## 2014-12-31 DIAGNOSIS — M79672 Pain in left foot: Secondary | ICD-10-CM | POA: Diagnosis not present

## 2014-12-31 DIAGNOSIS — E785 Hyperlipidemia, unspecified: Secondary | ICD-10-CM

## 2014-12-31 DIAGNOSIS — K219 Gastro-esophageal reflux disease without esophagitis: Secondary | ICD-10-CM | POA: Diagnosis not present

## 2014-12-31 NOTE — Progress Notes (Signed)
Subjective:    Patient ID: [name], female    DOB: [birth year], 74 y.o.   MRN: [id number]  HPI Pt here for follow up and management of chronic medical problems which includes hypertension and hyperlipidemia. She is taking medications regularly. The patient is concerned about a foreign body on her left heel which he thinks is a piece of glass. This is tender and painful when she walks on it. She is due to return an FOBT and to get lab work today. She is also due to get a DEXA scan. She is due to get a pelvic exam but prefers to wait 1 year. We will encourage her to get it sooner. She denies chest pain shortness of breath trouble swallowing heartburn indigestion nausea vomiting diarrhea or blood in the stool.      Patient Active Problem List   Diagnosis Date Noted  . 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 Polyneuropathy in other diseases classified elsewhere ([place] [facility]) 09/24/2013  . Memory disorder 09/24/2013  . Sleep apnea 05/15/2013  . Hypothyroidism, postsurgical 05/15/2013  . HTN (hypertension) 05/15/2013  . Vitamin D deficiency 05/15/2013  . Hyperlipidemia 05/15/2013  . Osteopenia 10/25/2012  . Mild cognitive impairment with memory loss 09/27/2012  . Trigeminal neuralgia 04/02/2011  . Dysphagia 07/23/2010  . GERD (gastroesophageal reflux disease) 07/23/2010  . Right facial pain 07/23/2010  . ANXIETY 05/24/2008  . COPD 05/24/2008  . CONSTIPATION 05/24/2008  . ARTHRITIS 05/24/2008  . Idiopathic osteoporosis 05/24/2008  . ADENOCARCINOMA, BREAST 05/22/2008  . INTERNAL HEMORRHOIDS 05/22/2008  . COLONIC POLYPS, ADENOMATOUS, HX OF 05/22/2008   Outpatient Encounter Prescriptions as of 12/31/2014  Medication Sig  . aspirin 81 MG tablet Take 81 mg by mouth daily.    . Calcium Carbonate-Vitamin D (CALCIUM 500/D) 500-125 MG-UNIT TABS Take 1 capsule by mouth daily.    . Cholecalciferol (VITAMIN D3) 2000 UNITS capsule Take 4,000 Units by mouth daily.    Marland Kitchen donepezil (ARICEPT) 10 MG tablet Take 10 mg by mouth daily.  Marland Kitchen gabapentin (NEURONTIN) 300 MG capsule Take 300-600 mg by mouth 2 (two) times daily. Takes 1 tab in am and 2 tabs at night  . ibuprofen (ADVIL,MOTRIN) 200 MG tablet Take 400 mg by mouth every 6 (six) hours as needed (Pain).   Marland Kitchen lansoprazole (PREVACID) 15 MG capsule Take 15 mg by mouth as needed. For heartburn  . levothyroxine (SYNTHROID, LEVOTHROID) 100 MCG tablet TAKE (1) TABLET BY MOUTH DAILY.  Marland Kitchen lisinopril-hydrochlorothiazide (PRINZIDE,ZESTORETIC) 10-12.5 MG tablet TAKE (1) TABLET BY MOUTH DAILY.  . Multiple Vitamin (MULTIVITAMIN) capsule Take 1 capsule by mouth daily.    . nortriptyline (PAMELOR) 25 MG capsule Take 3 capsules by mouth at bedtime and may repeat dose one time if needed.  . Omega-3 Fatty Acids (FISH OIL) 1000 MG CAPS Take 1 capsule by mouth daily.    Marland Kitchen senna-docusate (SENOKOT S) 8.6-50 MG per tablet Take 1 tablet by mouth daily as needed for constipation.  Marland Kitchen ZETIA 10 MG tablet TAKE (1) TABLET BY MOUTH DAILY.   No facility-administered encounter medications on file as of 12/31/2014.      Review of Systems  Constitutional: Negative.   HENT: Negative.   Eyes: Negative.   Respiratory: Negative.   Cardiovascular: Negative.   Gastrointestinal: Negative.   Endocrine: Negative.   Genitourinary: Negative.   Musculoskeletal: Negative.   Skin: Negative.        Small piece of glass on left heel  Allergic/Immunologic: Negative.   Neurological:  Negative.   Hematological: Negative.   Psychiatric/Behavioral: Negative.        Objective:   Physical Exam  Constitutional: She is oriented to person, place, and time. She appears well-developed and well-nourished. No distress.  The patient is pleasant and alert.  HENT:  Head: Normocephalic and atraumatic.  Right Ear: External ear normal.  Left Ear: External ear normal.  Mouth/Throat: Oropharynx is clear and moist. No oropharyngeal exudate.  Minimal nasal  congestion bilaterally  Eyes: Conjunctivae and EOM are normal. Pupils are equal, round, and reactive to light. Right eye exhibits no discharge. Left eye exhibits no discharge. No scleral icterus.  Neck: Normal range of motion. Neck supple. No thyromegaly present.  No bruits thyromegaly or anterior cervical adenopathy  Cardiovascular: Normal rate, regular rhythm, normal heart sounds and intact distal pulses.   No murmur heard. Heart had a regular rate and rhythm at 84/m  Pulmonary/Chest: Effort normal and breath sounds normal. No respiratory distress. She has no wheezes. She has no rales. She exhibits no tenderness.  Abdominal: Soft. Bowel sounds are normal. She exhibits no mass. There is no tenderness. There is no rebound and no guarding.  Musculoskeletal: Normal range of motion. She exhibits tenderness. She exhibits no edema.  There is tenderness to the plantar surface of the left heel where the foreign body has penetrated.  Lymphadenopathy:    She has no cervical adenopathy.  Neurological: She is alert and oriented to person, place, and time. She has normal reflexes. No cranial nerve deficit.  Skin: Skin is warm and dry. No rash noted.  Foreign body left heel plantar surface. Tender to palpation  Psychiatric: She has a normal mood and affect. Her behavior is normal. Judgment and thought content normal.  Nursing note and vitals reviewed.   BP 132/80 mmHg  Pulse 70  Temp(Src) 97 F (36.1 C) (Oral)  Ht '5\' 7"'  (1.702 m)  Wt 171 lb (77.565 kg)  BMI 26.78 kg/m2       Assessment & Plan:  1. Hyperlipidemia -Continue Zetia and aggressive therapeutic lifestyle changes - CBC with Differential/Platelet - NMR, lipoprofile  2. Hypothyroidism, postsurgical -Continue current thyroid dosage pending results of lab work - CBC with Differential/Platelet - Thyroid Panel With TSH  3. Essential hypertension -The blood pressure is good today and she will continue with current treatment -  BMP8+EGFR - CBC with Differential/Platelet - Hepatic function panel  4. Vitamin D deficiency -Continue with current treatment pending results of lab work - CBC with Differential/Platelet - VITAMIN D 25 Hydroxy (Vit-D Deficiency, Fractures)  5. Gastroesophageal reflux disease, esophagitis presence not specified -She has no complaints with reflux or heartburn and she will continue with the Prevacid - CBC with Differential/Platelet - Hepatic function panel  6. B12 deficiency -Continue with multivitamins - CBC with Differential/Platelet  7. Heel pain, left -Arrange for patient to come back and attempt to remove foreign body once x-ray is obtained - DG Os Calcis Left; Future   8. Foreign body left heel -We'll schedule a visit for removal  Patient Instructions                       Medicare Annual Wellness Visit  [facility] and the medical providers at [facility] strive to bring you the best medical care.  In doing so we not only want to address your current medical conditions and concerns but also to detect new conditions early and prevent illness, disease and health-related problems.  Medicare offers a yearly Wellness Visit which allows our clinical staff to assess your need for preventative services including immunizations, lifestyle education, counseling to decrease risk of preventable diseases and screening for fall risk and other medical concerns.    This visit is provided free of charge (no copay) for all Medicare recipients. The clinical pharmacists at [facility] have begun to conduct these Wellness Visits which will also include a thorough review of all your medications.    As you primary medical provider recommend that you make an appointment for your Annual Wellness Visit if you have not done so already this year.  You may set up this appointment before you leave today or you may call back [phone]) and schedule an  appointment.  Please make sure when you call that you mention that you are scheduling your Annual Wellness Visit with the clinical pharmacist so that the appointment may be made for the proper length of time.     Continue current medications. Continue good therapeutic lifestyle changes which include good diet and exercise. Fall precautions discussed with patient. If an FOBT was given today- please return it to our front desk. If you are over 70 years old - you may need Prevnar 28 or the adult Pneumonia vaccine.  **Flu shots are available--- please call and schedule a FLU-CLINIC appointment**  After your visit with [place] today you will receive a survey in the mail or online from [employer] regarding your care with [place]. Please take a moment to fill this out. Your feedback is very important to [place] as you can help [place] better understand your patient needs as well as improve your experience and satisfaction. WE CARE ABOUT YOU!!!   Try Katrine Coho fish oil - you can refrigerate or freeze them  We will arrange for you to come back and see if we can remove the foreign body from your heel before he becomes too embedded.   Arrie Senate MD

## 2014-12-31 NOTE — Patient Instructions (Addendum)
Medicare Annual Wellness Visit  [facility] and the medical providers at [facility] strive to bring you the best medical care.  In doing so we not only want to address your current medical conditions and concerns but also to detect new conditions early and prevent illness, disease and health-related problems.    Medicare offers a yearly Wellness Visit which allows our clinical staff to assess your need for preventative services including immunizations, lifestyle education, counseling to decrease risk of preventable diseases and screening for fall risk and other medical concerns.    This visit is provided free of charge (no copay) for all Medicare recipients. The clinical pharmacists at [facility] have begun to conduct these Wellness Visits which will also include a thorough review of all your medications.    As you primary medical provider recommend that you make an appointment for your Annual Wellness Visit if you have not done so already this year.  You may set up this appointment before you leave today or you may call back [phone] and schedule an appointment.  Please make sure when you call that you mention that you are scheduling your Annual Wellness Visit with the clinical pharmacist so that the appointment may be made for the proper length of time.     Continue current medications. Continue good therapeutic lifestyle changes which include good diet and exercise. Fall precautions discussed with patient. If an FOBT was given today- please return it to our front desk. If you are over 45 years old - you may need Prevnar 75 or the adult Pneumonia vaccine.  **Flu shots are available--- please call and schedule a FLU-CLINIC appointment**  After your visit with [place] today you will receive a survey in the mail or online from [employer] regarding your care with [place]. Please take a moment to fill this out. Your feedback is very  important to [place] as you can help [place] better understand your patient needs as well as improve your experience and satisfaction. WE CARE ABOUT YOU!!!   Try Katrine Coho fish oil - you can refrigerate or freeze them  We will arrange for you to come back and see if we can remove the foreign body from your heel before he becomes too embedded.

## 2015-01-01 ENCOUNTER — Other Ambulatory Visit: Payer: Self-pay | Admitting: *Deleted

## 2015-01-01 ENCOUNTER — Telehealth: Payer: Self-pay | Admitting: Family Medicine

## 2015-01-01 LAB — NMR, LIPOPROFILE
Cholesterol: 220 mg/dL — ABNORMAL HIGH (ref 100–199)
HDL CHOLESTEROL BY NMR: 57 mg/dL (ref >39)
HDL Particle Number: 39 umol/L (ref >=30.5)
LDL PARTICLE NUMBER: 2096 nmol/L — AB (ref <1000)
LDL Size: 20.5 nm (ref >20.5)
LDL-C: 139 mg/dL — AB (ref 0–99)
LP-IR SCORE: 55 — AB (ref <=45)
Small LDL Particle Number: 1104 nmol/L — ABNORMAL HIGH (ref <=527)
Triglycerides by NMR: 120 mg/dL (ref 0–149)

## 2015-01-01 LAB — BMP8+EGFR
BUN/Creatinine Ratio: 24 (ref 11–26)
BUN: 14 mg/dL (ref 8–27)
CALCIUM: 9.6 mg/dL (ref 8.7–10.3)
CHLORIDE: 98 mmol/L (ref 96–106)
CO2: 26 mmol/L (ref 18–29)
Creatinine, Ser: 0.58 mg/dL (ref 0.57–1.00)
GFR calc Af Amer: 106 mL/min/{1.73_m2} (ref >59)
GFR calc non Af Amer: 92 mL/min/{1.73_m2} (ref >59)
Glucose: 92 mg/dL (ref 65–99)
POTASSIUM: 4.5 mmol/L (ref 3.5–5.2)
Sodium: 143 mmol/L (ref 134–144)

## 2015-01-01 LAB — CBC WITH DIFFERENTIAL/PLATELET
Basophils Absolute: 0.1 10*3/uL (ref 0.0–0.2)
Basos: 1 %
EOS (ABSOLUTE): 0.4 10*3/uL (ref 0.0–0.4)
Eos: 5 %
HEMATOCRIT: 40.3 % (ref 34.0–46.6)
HEMOGLOBIN: 13.5 g/dL (ref 11.1–15.9)
IMMATURE GRANS (ABS): 0 10*3/uL (ref 0.0–0.1)
Immature Granulocytes: 0 %
LYMPHS: 30 %
Lymphocytes Absolute: 2 10*3/uL (ref 0.7–3.1)
MCH: 30.8 pg (ref 26.6–33.0)
MCHC: 33.5 g/dL (ref 31.5–35.7)
MCV: 92 fL (ref 79–97)
MONOCYTES: 7 %
Monocytes Absolute: 0.5 10*3/uL (ref 0.1–0.9)
NEUTROS ABS: 3.9 10*3/uL (ref 1.4–7.0)
NEUTROS PCT: 57 %
Platelets: 258 10*3/uL (ref 150–379)
RBC: 4.39 x10E6/uL (ref 3.77–5.28)
RDW: 14.2 % (ref 12.3–15.4)
WBC: 6.9 10*3/uL (ref 3.4–10.8)

## 2015-01-01 LAB — THYROID PANEL WITH TSH
FREE THYROXINE INDEX: 2.1 (ref 1.2–4.9)
T3 UPTAKE RATIO: 24 % (ref 24–39)
T4 TOTAL: 8.7 ug/dL (ref 4.5–12.0)
TSH: 9.4 u[IU]/mL — AB (ref 0.450–4.500)

## 2015-01-01 LAB — HEPATIC FUNCTION PANEL
ALBUMIN: 4.4 g/dL (ref 3.5–4.8)
ALT: 23 IU/L (ref 0–32)
AST: 24 IU/L (ref 0–40)
Alkaline Phosphatase: 77 IU/L (ref 39–117)
Bilirubin Total: 0.4 mg/dL (ref 0.0–1.2)
Bilirubin, Direct: 0.12 mg/dL (ref 0.00–0.40)
Total Protein: 6.6 g/dL (ref 6.0–8.5)

## 2015-01-01 LAB — VITAMIN D 25 HYDROXY (VIT D DEFICIENCY, FRACTURES): VIT D 25 HYDROXY: 44.9 ng/mL (ref 30.0–100.0)

## 2015-01-01 MED ORDER — LEVOTHYROXINE SODIUM 25 MCG PO TABS: ORAL_TABLET | ORAL | Status: DC

## 2015-01-01 NOTE — Telephone Encounter (Signed)
Aware of new script of levothyroxine  1/2 tablet of 25mg  to be taken with her normal dose.  Repeat lab work in 6 weeks.

## 2015-01-02 ENCOUNTER — Ambulatory Visit (INDEPENDENT_AMBULATORY_CARE_PROVIDER_SITE_OTHER): Payer: Medicare Other | Admitting: Family Medicine

## 2015-01-02 ENCOUNTER — Encounter: Payer: Self-pay | Admitting: Family Medicine

## 2015-01-02 VITALS — BP 134/75 | HR 72 | Temp 96.8°F | Ht 67.0 in | Wt 172.2 lb

## 2015-01-02 DIAGNOSIS — S90852A Superficial foreign body, left foot, initial encounter: Secondary | ICD-10-CM

## 2015-01-02 MED ORDER — CEPHALEXIN 500 MG PO CAPS: 500.0000 mg | ORAL_CAPSULE | Freq: Two times a day (BID) | ORAL | Status: DC

## 2015-01-02 NOTE — Progress Notes (Signed)
BP 134/75 mmHg  Pulse 72  Temp(Src) 96.8 F (36 C) (Oral)  Ht 5\' 7"  (1.702 m)  Wt 172 lb 3.2 oz (78.109 kg)  BMI 26.96 kg/m2   Subjective:    Patient ID: [name], female    DOB: 09-28-1940, 74 y.o.   MRN: [id number]  HPI: [name] is a 74 y.o. female presenting on 01/02/2015 for Piece of glass in heel of left foot   HPI Glass in foot Patient presents today because she has a shard of glass in her left foot that she stepped on about 2 weeks ago. She has been trying to get out on her own but came in 2 days ago and had an x-ray to show that she actually did have that chart there. The pain associated with that has been increasing and she is ready to have it out. She denies any fevers or chills or any drainage that she has noted.  Relevant past medical, surgical, family and social history reviewed and updated as indicated. Interim medical history since our last visit reviewed. Allergies and medications reviewed and updated.  Review of Systems  Constitutional: Negative for fever and chills.  HENT: Negative for congestion, ear discharge and ear pain.   Eyes: Negative for redness and visual disturbance.  Respiratory: Negative for chest tightness and shortness of breath.   Cardiovascular: Negative for chest pain and leg swelling.  Genitourinary: Negative for dysuria and difficulty urinating.  Musculoskeletal: Negative for back pain and gait problem.  Skin: Positive for color change and wound. Negative for rash.  Neurological: Negative for light-headedness and headaches.  Psychiatric/Behavioral: Negative for behavioral problems and agitation.  All other systems reviewed and are negative.   Per HPI unless specifically indicated above     Medication List       This list is accurate as of: 01/02/15  9:36 AM.  Always use your most recent med list.               aspirin 81 MG tablet  Take 81 mg by mouth daily.     Calcium 500/D 500-125 MG-UNIT Tabs  Generic drug:   Calcium Carbonate-Vitamin D  Take 1 capsule by mouth daily.     cephALEXin 500 MG capsule  Commonly known as:  KEFLEX  Take 1 capsule (500 mg total) by mouth 2 (two) times daily.     donepezil 10 MG tablet  Commonly known as:  ARICEPT  Take 10 mg by mouth daily.     Fish Oil 1000 MG Caps  Take 1 capsule by mouth daily.     gabapentin 300 MG capsule  Commonly known as:  NEURONTIN  Take 300-600 mg by mouth 2 (two) times daily. Takes 1 tab in am and 2 tabs at night     ibuprofen 200 MG tablet  Commonly known as:  ADVIL,MOTRIN  Take 400 mg by mouth every 6 (six) hours as needed (Pain).     lansoprazole 15 MG capsule  Commonly known as:  PREVACID  Take 15 mg by mouth as needed. For heartburn     levothyroxine 100 MCG tablet  Commonly known as:  SYNTHROID, LEVOTHROID  TAKE (1) TABLET BY MOUTH DAILY.     levothyroxine 25 MCG tablet  Commonly known as:  LEVOTHROID  Take 1/2 tablet every morning     lisinopril-hydrochlorothiazide 10-12.5 MG tablet  Commonly known as:  PRINZIDE,ZESTORETIC  TAKE (1) TABLET BY MOUTH DAILY.     multivitamin capsule  Take 1 capsule  by mouth daily.     nortriptyline 25 MG capsule  Commonly known as:  PAMELOR  Take 3 capsules by mouth at bedtime and may repeat dose one time if needed.     SENOKOT S 8.6-50 MG tablet  Generic drug:  senna-docusate  Take 1 tablet by mouth daily as needed for constipation.     Vitamin D3 2000 units capsule  Take 4,000 Units by mouth daily.     ZETIA 10 MG tablet  Generic drug:  ezetimibe  TAKE (1) TABLET BY MOUTH DAILY.           Objective:    BP 134/75 mmHg  Pulse 72  Temp(Src) 96.8 F (36 C) (Oral)  Ht 5\' 7"  (1.702 m)  Wt 172 lb 3.2 oz (78.109 kg)  BMI 26.96 kg/m2  Wt Readings from Last 3 Encounters:  01/02/15 172 lb 3.2 oz (78.109 kg)  12/31/14 171 lb (77.565 kg)  08/28/14 171 lb (77.565 kg)    Physical Exam  Constitutional: She is oriented to person, place, and time. She appears  well-developed and well-nourished. No distress.  Eyes: Conjunctivae and EOM are normal.  Cardiovascular: Normal rate, regular rhythm, normal heart sounds and intact distal pulses.   No murmur heard. Pulmonary/Chest: Effort normal and breath sounds normal. No respiratory distress. She has no wheezes.  Musculoskeletal: Normal range of motion. She exhibits no edema or tenderness.  Neurological: She is alert and oriented to person, place, and time. Coordination normal.  Skin: Skin is warm and dry. Lesion (smalllesion on the base of her foot that is flat has a small amount of erythema surrounding it and is dark in the center., Very tender to palpation.) noted. No rash noted. She is not diaphoretic.  Psychiatric: She has a normal mood and affect. Her behavior is normal.  Nursing note and vitals reviewed.  Incision and drainage: Performed incision and drainage using topical Betadine for preparation and topical anesthetic. Using an 11 blade made incision and had small amount of purulent drainage upon return. Debrided down to where find the shard of glass and able to remove with forceps and dispose of it. Cleansed with alcohol and then placed a simple bandage. Send antibiotic    Assessment & Plan:   Problem List Items Addressed This Visit    None    Visit Diagnoses    Foreign body in foot, left, initial encounter    -  Primary    Relevant Medications    cephALEXin (KEFLEX) 500 MG capsule        Follow up plan: Return if symptoms worsen or fail to improve.  Counseling provided for all of the vaccine components No orders of the defined types were placed in this encounter.    Caryl Pina, MD [facility] 01/02/2015, 9:36 AM

## 2015-01-07 ENCOUNTER — Ambulatory Visit: Payer: Medicare Other | Admitting: Family Medicine

## 2015-01-23 DIAGNOSIS — L820 Inflamed seborrheic keratosis: Secondary | ICD-10-CM | POA: Diagnosis not present

## 2015-01-23 DIAGNOSIS — D225 Melanocytic nevi of trunk: Secondary | ICD-10-CM | POA: Diagnosis not present

## 2015-01-23 DIAGNOSIS — I781 Nevus, non-neoplastic: Secondary | ICD-10-CM | POA: Diagnosis not present

## 2015-01-24 ENCOUNTER — Other Ambulatory Visit: Payer: Self-pay

## 2015-01-24 ENCOUNTER — Other Ambulatory Visit: Payer: Self-pay | Admitting: Family Medicine

## 2015-01-24 MED ORDER — LEVOTHYROXINE SODIUM 25 MCG PO TABS: ORAL_TABLET | ORAL | Status: DC

## 2015-01-30 ENCOUNTER — Other Ambulatory Visit: Payer: Self-pay | Admitting: *Deleted

## 2015-01-30 MED ORDER — LEVOTHYROXINE SODIUM 112 MCG PO TABS
112.0000 ug | ORAL_TABLET | Freq: Every day | ORAL | Status: DC
Start: 2015-01-30 — End: 2015-04-03

## 2015-02-11 ENCOUNTER — Ambulatory Visit (INDEPENDENT_AMBULATORY_CARE_PROVIDER_SITE_OTHER): Payer: Medicare Other | Admitting: Adult Health

## 2015-02-11 ENCOUNTER — Encounter: Payer: Self-pay | Admitting: Adult Health

## 2015-02-11 VITALS — BP 120/78 | HR 87 | Ht 67.0 in | Wt 175.0 lb

## 2015-02-11 DIAGNOSIS — G63 Polyneuropathy in diseases classified elsewhere: Secondary | ICD-10-CM

## 2015-02-11 DIAGNOSIS — G3184 Mild cognitive impairment, so stated: Secondary | ICD-10-CM | POA: Diagnosis not present

## 2015-02-11 NOTE — Progress Notes (Signed)
PATIENT: [name] DOB: 1940/09/16  REASON FOR VISIT: follow up- trigeminal neuralgia, peripheral neuropathy, memory loss HISTORY FROM: patient  HISTORY OF PRESENT ILLNESS: [name] is a 75 year old female with a history of trigeminal neuralgia, peripheral neuropathy and memory loss. She returns today for follow-up. The patient continues to take gabapentin 300 mg in a.m. and 600 mg in the PM. She also takes nortriptyline 75 mg in the PM. She feels that this continues to work well for her. She may have mild discomfort in the lower extremities but it is tolerable. She states that she sleeps well at night. She is able to complete all ADLs independently. She also prepares her own meals and completes her own finances. She does not feel that there is any significant change with her memory. She operates a Teacher, music without difficulty. She states that she completes her own yard work without difficulty. Denies any changes with her gait or balance. Overall she feels that she is doing well. She returns today for an evaluation.  HISTORY 08/08/14: [name] is a 75 year old fem ale with a history of right-sided trigeminal neuralgia, peripheral neuropathy and memory loss. She returns today for follow-up. The patient continues to take gabapentin 300 mg in the a.m. and 600 mg in the PM. She also takes nortriptyline 75 mg in the PM. She reports that her neuropathy has remained stable. She states that the medications have controlled her discomfort. Her discomfort continues to extend to above the ankles. She states that she is sleeping well at night. She denies any changes with her memory. She continues to complete all ADLs independently. She operates a Teacher, music without difficulty. She denies any new neurological symptoms. She returns today for an evaluation.  REVIEW OF SYSTEMS: Out of a complete 14 system review of symptoms, the patient complains only of the following symptoms, and all other reviewed  systems are negative.  ALLERGIES: Allergies  Allergen Reactions  . Atorvastatin Other (See Comments)    Muscle cramps  . Meperidine Hcl Nausea And Vomiting    Makes me 'deathly' sick  . Statins     REACTION: Patient cannot tolerate due cramps  . Sulfonamide Derivatives Other (See Comments)    Unknown reaction    HOME MEDICATIONS: Outpatient Prescriptions Prior to Visit  Medication Sig Dispense Refill  . aspirin 81 MG tablet Take 81 mg by mouth daily.      . Calcium Carbonate-Vitamin D (CALCIUM 500/D) 500-125 MG-UNIT TABS Take 1 capsule by mouth daily.      . cephALEXin (KEFLEX) 500 MG capsule Take 1 capsule (500 mg total) by mouth 2 (two) times daily. 20 capsule 0  . Cholecalciferol (VITAMIN D3) 2000 UNITS capsule Take 4,000 Units by mouth daily.     Marland Kitchen donepezil (ARICEPT) 10 MG tablet Take 10 mg by mouth daily.    Marland Kitchen gabapentin (NEURONTIN) 300 MG capsule Take 300-600 mg by mouth 2 (two) times daily. Takes 1 tab in am and 2 tabs at night    . ibuprofen (ADVIL,MOTRIN) 200 MG tablet Take 400 mg by mouth every 6 (six) hours as needed (Pain).     Marland Kitchen lansoprazole (PREVACID) 15 MG capsule Take 15 mg by mouth as needed. For heartburn    . levothyroxine (SYNTHROID, LEVOTHROID) 112 MCG tablet Take 1 tablet (112 mcg total) by mouth daily before breakfast. 30 tablet 1  . lisinopril-hydrochlorothiazide (PRINZIDE,ZESTORETIC) 10-12.5 MG tablet TAKE (1) TABLET BY MOUTH DAILY. 30 tablet 3  . Multiple Vitamin (MULTIVITAMIN) capsule  Take 1 capsule by mouth daily.      . nortriptyline (PAMELOR) 25 MG capsule Take 3 capsules by mouth at bedtime and may repeat dose one time if needed.    . Omega-3 Fatty Acids (FISH OIL) 1000 MG CAPS Take 1 capsule by mouth daily.      Marland Kitchen senna-docusate (SENOKOT S) 8.6-50 MG per tablet Take 1 tablet by mouth daily as needed for constipation.    Marland Kitchen ZETIA 10 MG tablet TAKE (1) TABLET BY MOUTH DAILY. 30 tablet 5   No facility-administered medications prior to visit.    PAST  MEDICAL HISTORY: Past Medical History  Diagnosis Date  . Arthropathy, unspecified, site unspecified   . Anxiety state, unspecified   . Headache(784.0)   . Other and unspecified hyperlipidemia   . Unspecified hypothyroidism   . Personal history of colonic polyps     adenomatous  . Internal hemorrhoids without mention of complication   . Other dysphagia   . Esophageal reflux   . Pain in joint, multiple sites   . Anal or rectal pain   . Shingles   . Esophageal stricture   . Memory disorder 09/24/2013  . Hypertension   . Sleep apnea     cpap   . Chronic airway obstruction, not elsewhere classified     patient denies on 02/27/14  . Arthritis   . Malignant neoplasm of breast (female), unspecified site     left breast   . Cataract   . Osteopenia   . Polyneuropathy in other diseases classified elsewhere ([place]) 09/24/2013  . Carpal tunnel syndrome     Bilateral    PAST SURGICAL HISTORY: Past Surgical History  Procedure Laterality Date  . Back surgery      X2  . Mastectomy      left  . Breast lumpectomy    . Thyroidectomy    . Tubal ligation    . Laparoscopic appendectomy N/A 02/28/2014    Procedure: APPENDECTOMY LAPAROSCOPIC PARTIAL CECECTOMY FOR MUCOCELE OF APPENDIX ;  Surgeon: Michael Boston, MD;  Location: [facility];  Service: General;  Laterality: N/A;  . Eye surgery    . Appendectomy  03/2014    FAMILY HISTORY: Family History  Problem Relation Age of Onset  . Bipolar disorder Sister   . Colon cancer Neg Hx   . Esophageal cancer Neg Hx   . Rectal cancer Neg Hx   . Stomach cancer Neg Hx   . Early death Mother   . Early death Sister     SOCIAL HISTORY: Social History   Social History  . Marital Status: Divorced    Spouse Name: N/A  . Number of Children: 2  . Years of Education: N/A   Occupational History  . Retired    Social History Main Topics  . Smoking status: Never Smoker   . Smokeless tobacco: Never Used  . Alcohol Use: No  . Drug Use: No  . Sexual  Activity: No   Other Topics Concern  . Not on file   Social History Narrative   Patient lives at home alone and she is divorced.   Retired.   Education business course.   Right handed.   Caffeine sometimes tea.       PHYSICAL EXAM  Filed Vitals:   02/11/15 1045  BP: 120/78  Pulse: 87  Height: 5\' 7"  (1.702 m)  Weight: 175 lb (79.379 kg)   Body mass index is 27.4 kg/(m^2).  MMSE - Mini Mental State Exam 02/11/2015  08/08/2014 06/07/2014  Orientation to time 5 5 4   Orientation to Place 5 5 5   Registration 3 3 3   Attention/ Calculation 3 5 5   Recall 3 3 3   Language- name 2 objects 2 2 2   Language- repeat 0 1 1  Language- follow 3 step command 3 3 2   Language- read & follow direction 1 1 1   Write a sentence 1 1 1   Copy design 1 1 1   Total score 27 30 28      Generalized: Well developed, in no acute distress   Neurological examination  Mentation: Alert oriented to time, place, history taking. Follows all commands speech and language fluent Cranial nerve II-XII: Pupils were equal round reactive to light. Extraocular movements were full, visual field were full on confrontational test. Facial sensation and strength were normal. Uvula tongue midline. Head turning and shoulder shrug  were normal and symmetric. Motor: The motor testing reveals 5 over 5 strength of all 4 extremities. Good symmetric motor tone is noted throughout.  Sensory: Sensory testing is intact to soft touch on all 4 extremities. No evidence of extinction is noted.  Coordination: Cerebellar testing reveals good finger-nose-finger and heel-to-shin bilaterally.  Gait and station: Gait is normal. Tandem gait is normal. Romberg is negative. No drift is seen.  Reflexes: Deep tendon reflexes are symmetric and normal bilaterally.   DIAGNOSTIC DATA (LABS, IMAGING, TESTING) - I reviewed patient records, labs, notes, testing and imaging myself where available.  Lab Results  Component Value Date   WBC 6.9 12/31/2014    HGB 13.1 04/17/2014   HCT 40.3 12/31/2014   MCV 92 12/31/2014   PLT 258 12/31/2014      Component Value Date/Time   NA 143 12/31/2014 1201   NA 140 02/27/2014 1015   NA 144 04/23/2013 1549   K 4.5 12/31/2014 1201   K 3.5 04/23/2013 1549   CL 98 12/31/2014 1201   CO2 26 12/31/2014 1201   CO2 25 04/23/2013 1549   GLUCOSE 92 12/31/2014 1201   GLUCOSE 94 02/27/2014 1015   GLUCOSE 97 04/23/2013 1549   BUN 14 12/31/2014 1201   BUN 11 02/27/2014 1015   BUN 20.1 04/23/2013 1549   CREATININE 0.58 12/31/2014 1201   CREATININE 0.7 04/23/2013 1549   CREATININE 0.50 07/10/2012 1056   CALCIUM 9.6 12/31/2014 1201   CALCIUM 10.3 04/23/2013 1549   PROT 6.6 12/31/2014 1201   PROT 6.8 04/23/2013 1549   PROT 6.9 07/10/2012 1056   ALBUMIN 4.4 12/31/2014 1201   ALBUMIN 4.1 04/23/2013 1549   ALBUMIN 4.6 07/10/2012 1056   AST 24 12/31/2014 1201   AST 31 04/23/2013 1549   ALT 23 12/31/2014 1201   ALT 32 04/23/2013 1549   ALKPHOS 77 12/31/2014 1201   ALKPHOS 74 04/23/2013 1549   BILITOT 0.4 12/31/2014 1201   BILITOT 0.8 09/28/2013 0823   BILITOT 0.53 04/23/2013 1549   GFRNONAA 92 12/31/2014 1201   GFRNONAA >89 07/10/2012 1056   GFRAA 106 12/31/2014 1201   GFRAA >89 07/10/2012 1056   Lab Results  Component Value Date   CHOL 220* 12/31/2014   HDL 57 12/31/2014   LDLCALC 171* 09/23/2014   LDLDIRECT 164* 06/07/2014   TRIG 120 12/31/2014   CHOLHDL 4.3 09/23/2014    Lab Results  Component Value Date   VITAMINB12 301 06/07/2014   Lab Results  Component Value Date   TSH 9.400* 12/31/2014      ASSESSMENT AND PLAN 75 y.o. year old female  has  a past medical history of Arthropathy, unspecified, site unspecified; Anxiety state, unspecified; Headache(784.0); Other and unspecified hyperlipidemia; Unspecified hypothyroidism; Personal history of colonic polyps; Internal hemorrhoids without mention of complication; Other dysphagia; Esophageal reflux; Pain in joint, multiple sites; Anal or  rectal pain; Shingles; Esophageal stricture; Memory disorder (09/24/2013); Hypertension; Sleep apnea; Chronic airway obstruction, not elsewhere classified; Arthritis; Malignant neoplasm of breast (female), unspecified site; Cataract; Osteopenia; Polyneuropathy in other diseases classified elsewhere ([place]) (09/24/2013); and Carpal tunnel syndrome. here with:  1. Peripheral neuropathy 2. Memory loss  Overall the patient is doing well. She will remain on gabapentin and nortriptyline. The patient's memory score has remained relatively stable. Her MMSE today is 27/30. She lost points due to miscalculation. The patient did realize this. Patient advised that if her symptoms worsen or she develops any new symptoms she she'll let us know. She will follow-up in 6 months or sooner if needed.    Ward Givens, MSN, NP-C 02/11/2015, 10:48 AM [facility] [address] [place], [place] [address] [phone]

## 2015-02-11 NOTE — Patient Instructions (Signed)
Continue with gabapentin and nortriptyline If your symptoms worsen or you develop new symptoms please let us know.  Memory score is stable

## 2015-02-11 NOTE — Progress Notes (Signed)
I have read the note, and I agree with the clinical assessment and plan.  [name]   

## 2015-04-03 ENCOUNTER — Other Ambulatory Visit: Payer: Self-pay | Admitting: Family Medicine

## 2015-04-04 ENCOUNTER — Other Ambulatory Visit: Payer: Medicare Other

## 2015-04-04 DIAGNOSIS — E039 Hypothyroidism, unspecified: Secondary | ICD-10-CM | POA: Diagnosis not present

## 2015-04-04 DIAGNOSIS — I10 Essential (primary) hypertension: Secondary | ICD-10-CM

## 2015-04-04 DIAGNOSIS — E559 Vitamin D deficiency, unspecified: Secondary | ICD-10-CM

## 2015-04-04 DIAGNOSIS — E785 Hyperlipidemia, unspecified: Secondary | ICD-10-CM

## 2015-04-05 LAB — HEPATIC FUNCTION PANEL
ALT: 16 IU/L (ref 0–32)
AST: 21 IU/L (ref 0–40)
Albumin: 4.5 g/dL (ref 3.5–4.8)
Alkaline Phosphatase: 71 IU/L (ref 39–117)
BILIRUBIN, DIRECT: 0.16 mg/dL (ref 0.00–0.40)
Bilirubin Total: 0.7 mg/dL (ref 0.0–1.2)
Total Protein: 6.6 g/dL (ref 6.0–8.5)

## 2015-04-05 LAB — CBC WITH DIFFERENTIAL/PLATELET
BASOS: 1 %
Basophils Absolute: 0.1 10*3/uL (ref 0.0–0.2)
EOS (ABSOLUTE): 0.4 10*3/uL (ref 0.0–0.4)
EOS: 6 %
HEMATOCRIT: 39.7 % (ref 34.0–46.6)
Hemoglobin: 13.3 g/dL (ref 11.1–15.9)
IMMATURE GRANS (ABS): 0 10*3/uL (ref 0.0–0.1)
IMMATURE GRANULOCYTES: 0 %
LYMPHS: 34 %
Lymphocytes Absolute: 1.9 10*3/uL (ref 0.7–3.1)
MCH: 29.9 pg (ref 26.6–33.0)
MCHC: 33.5 g/dL (ref 31.5–35.7)
MCV: 89 fL (ref 79–97)
MONOS ABS: 0.4 10*3/uL (ref 0.1–0.9)
Monocytes: 7 %
NEUTROS ABS: 3 10*3/uL (ref 1.4–7.0)
NEUTROS PCT: 52 %
Platelets: 236 10*3/uL (ref 150–379)
RBC: 4.45 x10E6/uL (ref 3.77–5.28)
RDW: 14.1 % (ref 12.3–15.4)
WBC: 5.7 10*3/uL (ref 3.4–10.8)

## 2015-04-05 LAB — BMP8+EGFR
BUN / CREAT RATIO: 28 — AB (ref 11–26)
BUN: 15 mg/dL (ref 8–27)
CHLORIDE: 100 mmol/L (ref 96–106)
CO2: 24 mmol/L (ref 18–29)
Calcium: 8.8 mg/dL (ref 8.7–10.3)
Creatinine, Ser: 0.54 mg/dL — ABNORMAL LOW (ref 0.57–1.00)
GFR calc Af Amer: 108 mL/min/{1.73_m2} (ref >59)
GFR calc non Af Amer: 93 mL/min/{1.73_m2} (ref >59)
GLUCOSE: 90 mg/dL (ref 65–99)
Potassium: 3.8 mmol/L (ref 3.5–5.2)
SODIUM: 143 mmol/L (ref 134–144)

## 2015-04-05 LAB — THYROID PANEL WITH TSH
Free Thyroxine Index: 2.4 (ref 1.2–4.9)
T3 UPTAKE RATIO: 26 % (ref 24–39)
T4, Total: 9.1 ug/dL (ref 4.5–12.0)
TSH: 2.56 u[IU]/mL (ref 0.450–4.500)

## 2015-04-05 LAB — LIPID PANEL
CHOL/HDL RATIO: 4.3 ratio (ref 0.0–4.4)
Cholesterol, Total: 244 mg/dL — ABNORMAL HIGH (ref 100–199)
HDL: 57 mg/dL (ref >39)
LDL CALC: 166 mg/dL — AB (ref 0–99)
Triglycerides: 104 mg/dL (ref 0–149)
VLDL CHOLESTEROL CAL: 21 mg/dL (ref 5–40)

## 2015-04-05 LAB — VITAMIN D 25 HYDROXY (VIT D DEFICIENCY, FRACTURES): VIT D 25 HYDROXY: 48 ng/mL (ref 30.0–100.0)

## 2015-04-07 ENCOUNTER — Other Ambulatory Visit: Payer: Self-pay | Admitting: *Deleted

## 2015-04-07 DIAGNOSIS — M5412 Radiculopathy, cervical region: Secondary | ICD-10-CM | POA: Diagnosis not present

## 2015-04-07 DIAGNOSIS — M5441 Lumbago with sciatica, right side: Secondary | ICD-10-CM | POA: Diagnosis not present

## 2015-04-07 DIAGNOSIS — M542 Cervicalgia: Secondary | ICD-10-CM | POA: Diagnosis not present

## 2015-04-07 DIAGNOSIS — M5442 Lumbago with sciatica, left side: Secondary | ICD-10-CM | POA: Diagnosis not present

## 2015-04-07 DIAGNOSIS — M5417 Radiculopathy, lumbosacral region: Secondary | ICD-10-CM | POA: Diagnosis not present

## 2015-04-07 MED ORDER — EZETIMIBE 10 MG PO TABS: ORAL_TABLET | ORAL | Status: DC

## 2015-04-22 ENCOUNTER — Ambulatory Visit (INDEPENDENT_AMBULATORY_CARE_PROVIDER_SITE_OTHER): Payer: Medicare Other | Admitting: Family Medicine

## 2015-04-22 ENCOUNTER — Encounter (INDEPENDENT_AMBULATORY_CARE_PROVIDER_SITE_OTHER): Payer: Self-pay

## 2015-04-22 ENCOUNTER — Encounter: Payer: Self-pay | Admitting: Family Medicine

## 2015-04-22 VITALS — BP 123/65 | HR 85 | Temp 97.7°F | Ht 67.0 in | Wt 170.4 lb

## 2015-04-22 DIAGNOSIS — W57XXXA Bitten or stung by nonvenomous insect and other nonvenomous arthropods, initial encounter: Secondary | ICD-10-CM

## 2015-04-22 DIAGNOSIS — S00402A Unspecified superficial injury of left ear, initial encounter: Secondary | ICD-10-CM

## 2015-04-22 MED ORDER — DOXYCYCLINE HYCLATE 100 MG PO CAPS: 100.0000 mg | ORAL_CAPSULE | Freq: Two times a day (BID) | ORAL | Status: DC

## 2015-04-22 NOTE — Progress Notes (Signed)
Subjective:  Patient ID: Jaisa Mille, female    DOB: 1940-02-23  Age: 75 y.o. MRN: [id number]  CC: Insect Bite   HPI Merl Ancelet presents for tick bite behind the left ear. Two days ago felt left ear stinging. Last night noted the tick lodged behind the left ear.   History [place] has a past medical history of Arthropathy, unspecified, site unspecified; Anxiety state, unspecified; Headache(784.0); Other and unspecified hyperlipidemia; Unspecified hypothyroidism; Personal history of colonic polyps; Internal hemorrhoids without mention of complication; Other dysphagia; Esophageal reflux; Pain in joint, multiple sites; Anal or rectal pain; Shingles; Esophageal stricture; Memory disorder (09/24/2013); Hypertension; Sleep apnea; Chronic airway obstruction, not elsewhere classified; Arthritis; Malignant neoplasm of breast (female), unspecified site; Cataract; Osteopenia; Polyneuropathy in other diseases classified elsewhere ([place]) (09/24/2013); and Carpal tunnel syndrome.   She has past surgical history that includes Back surgery; Mastectomy; Breast lumpectomy; Thyroidectomy; Tubal ligation; laparoscopic appendectomy (N/A, 02/28/2014); Eye surgery; and Appendectomy (03/2014).   Her family history includes Bipolar disorder in her sister; Early death in her mother and sister. There is no history of Colon cancer, Esophageal cancer, Rectal cancer, or Stomach cancer.She reports that she has never smoked. She has never used smokeless tobacco. She reports that she does not drink alcohol or use illicit drugs.  Current Outpatient Prescriptions on File Prior to Visit  Medication Sig Dispense Refill  . Apoaequorin (PREVAGEN) 10 MG CAPS Take by mouth daily.    Marland Kitchen aspirin 81 MG tablet Take 81 mg by mouth daily.      . Calcium Carbonate-Vitamin D (CALCIUM 500/D) 500-125 MG-UNIT TABS Take 1 capsule by mouth daily.      . Cholecalciferol (VITAMIN D3) 2000 UNITS capsule Take 4,000 Units by mouth daily.     Marland Kitchen donepezil  (ARICEPT) 10 MG tablet Take 10 mg by mouth daily.    Marland Kitchen ezetimibe (ZETIA) 10 MG tablet TAKE (1) TABLET BY MOUTH DAILY. 30 tablet 5  . gabapentin (NEURONTIN) 300 MG capsule Take 300-600 mg by mouth 2 (two) times daily. Takes 1 tab in am and 2 tabs at night    . ibuprofen (ADVIL,MOTRIN) 200 MG tablet Take 400 mg by mouth every 6 (six) hours as needed (Pain).     Marland Kitchen lansoprazole (PREVACID) 15 MG capsule Take 15 mg by mouth as needed. For heartburn    . levothyroxine (SYNTHROID, LEVOTHROID) 112 MCG tablet TAKE 1 TABLET BY MOUTH DAILY BEFORE BREAKFAST. 30 tablet 2  . lisinopril-hydrochlorothiazide (PRINZIDE,ZESTORETIC) 10-12.5 MG tablet TAKE (1) TABLET BY MOUTH DAILY. 30 tablet 3  . Multiple Vitamin (MULTIVITAMIN) capsule Take 1 capsule by mouth daily.      . nortriptyline (PAMELOR) 25 MG capsule Take 3 capsules by mouth at bedtime and may repeat dose one time if needed.    . Omega-3 Fatty Acids (FISH OIL) 1000 MG CAPS Take 1 capsule by mouth daily.      Marland Kitchen senna-docusate (SENOKOT S) 8.6-50 MG per tablet Take 1 tablet by mouth daily as needed for constipation.     No current facility-administered medications on file prior to visit.    ROS Review of Systems  Constitutional: Negative for fever, activity change and fatigue.  HENT: Positive for ear pain. Negative for congestion, postnasal drip, sore throat, tinnitus and trouble swallowing.   Musculoskeletal: Negative for joint swelling and arthralgias.    Objective:  BP 123/65 mmHg  Pulse 85  Temp(Src) 97.7 F (36.5 C) (Oral)  Ht 5\' 7"  (1.702 m)  Wt 170 lb 6.4 oz (77.293  kg)  BMI 26.68 kg/m2  SpO2 97%  Physical Exam  Constitutional: She appears well-developed and well-nourished.  HENT:  Head: Normocephalic.  Eyes: Pupils are equal, round, and reactive to light.  Neck: Normal range of motion. Neck supple.  Cardiovascular: Normal rate and regular rhythm.   Pulmonary/Chest: Breath sounds normal.  Lymphadenopathy:    She has no cervical  adenopathy.  Skin: Skin is warm and dry.  There is a 4 mm papule with erythema and central   nidus    Assessment & Plan:   Kareem was seen today for insect bite.  Diagnoses and all orders for this visit:  Tick bite  Other orders -     doxycycline (VIBRAMYCIN) 100 MG capsule; Take 1 capsule (100 mg total) by mouth 2 (two) times daily.  I have discontinued Ms. Cutrone's cephALEXin. I am also having her start on doxycycline. Additionally, I am having her maintain her aspirin, Calcium Carbonate-Vitamin D, Fish Oil, multivitamin, lansoprazole, Vitamin D3, ibuprofen, nortriptyline, gabapentin, senna-docusate, donepezil, lisinopril-hydrochlorothiazide, Apoaequorin, levothyroxine, and ezetimibe.  Meds ordered this encounter  Medications  . doxycycline (VIBRAMYCIN) 100 MG capsule    Sig: Take 1 capsule (100 mg total) by mouth 2 (two) times daily.    Dispense:  20 capsule    Refill:  0     Follow-up: Return if symptoms worsen or fail to improve.  Claretta Fraise, M.D.

## 2015-05-01 ENCOUNTER — Other Ambulatory Visit: Payer: Self-pay | Admitting: Family Medicine

## 2015-05-12 ENCOUNTER — Emergency Department (HOSPITAL_COMMUNITY)
Admission: EM | Admit: 2015-05-12 | Discharge: 2015-05-13 | Disposition: A | Discharge status: Home / Self Care | Payer: Medicare Other | Attending: Emergency Medicine | Admitting: Emergency Medicine

## 2015-05-12 ENCOUNTER — Emergency Department (HOSPITAL_COMMUNITY): Payer: Medicare Other

## 2015-05-12 DIAGNOSIS — S280XXA Crushed chest, initial encounter: Secondary | ICD-10-CM | POA: Diagnosis not present

## 2015-05-12 DIAGNOSIS — J441 Chronic obstructive pulmonary disease with (acute) exacerbation: Secondary | ICD-10-CM | POA: Diagnosis not present

## 2015-05-12 DIAGNOSIS — Z792 Long term (current) use of antibiotics: Secondary | ICD-10-CM | POA: Insufficient documentation

## 2015-05-12 DIAGNOSIS — R079 Chest pain, unspecified: Secondary | ICD-10-CM | POA: Diagnosis not present

## 2015-05-12 DIAGNOSIS — Z79899 Other long term (current) drug therapy: Secondary | ICD-10-CM | POA: Insufficient documentation

## 2015-05-12 DIAGNOSIS — F419 Anxiety disorder, unspecified: Secondary | ICD-10-CM | POA: Diagnosis not present

## 2015-05-12 DIAGNOSIS — Z8619 Personal history of other infectious and parasitic diseases: Secondary | ICD-10-CM | POA: Insufficient documentation

## 2015-05-12 DIAGNOSIS — Z86010 Personal history of colonic polyps: Secondary | ICD-10-CM | POA: Diagnosis not present

## 2015-05-12 DIAGNOSIS — Z7982 Long term (current) use of aspirin: Secondary | ICD-10-CM | POA: Diagnosis not present

## 2015-05-12 DIAGNOSIS — E039 Hypothyroidism, unspecified: Secondary | ICD-10-CM | POA: Insufficient documentation

## 2015-05-12 DIAGNOSIS — Z8719 Personal history of other diseases of the digestive system: Secondary | ICD-10-CM | POA: Diagnosis not present

## 2015-05-12 DIAGNOSIS — G4730 Sleep apnea, unspecified: Secondary | ICD-10-CM | POA: Insufficient documentation

## 2015-05-12 DIAGNOSIS — M1990 Unspecified osteoarthritis, unspecified site: Secondary | ICD-10-CM | POA: Insufficient documentation

## 2015-05-12 DIAGNOSIS — I10 Essential (primary) hypertension: Secondary | ICD-10-CM | POA: Insufficient documentation

## 2015-05-12 DIAGNOSIS — G629 Polyneuropathy, unspecified: Secondary | ICD-10-CM | POA: Diagnosis not present

## 2015-05-12 DIAGNOSIS — R0789 Other chest pain: Secondary | ICD-10-CM | POA: Diagnosis not present

## 2015-05-12 DIAGNOSIS — Z853 Personal history of malignant neoplasm of breast: Secondary | ICD-10-CM | POA: Insufficient documentation

## 2015-05-12 DIAGNOSIS — E785 Hyperlipidemia, unspecified: Secondary | ICD-10-CM | POA: Insufficient documentation

## 2015-05-12 DIAGNOSIS — R0602 Shortness of breath: Secondary | ICD-10-CM | POA: Diagnosis not present

## 2015-05-12 LAB — COMPREHENSIVE METABOLIC PANEL
ALBUMIN: 3.2 g/dL — AB (ref 3.5–5.0)
ALK PHOS: 51 U/L (ref 38–126)
ALT: 21 U/L (ref 14–54)
AST: 20 U/L (ref 15–41)
Anion gap: 9 (ref 5–15)
BUN: 13 mg/dL (ref 6–20)
CALCIUM: 8.6 mg/dL — AB (ref 8.9–10.3)
CHLORIDE: 107 mmol/L (ref 101–111)
CO2: 26 mmol/L (ref 22–32)
CREATININE: 0.59 mg/dL (ref 0.44–1.00)
GFR calc Af Amer: >60 mL/min (ref >60)
GFR calc non Af Amer: >60 mL/min (ref >60)
GLUCOSE: 126 mg/dL — AB (ref 65–99)
Potassium: 3.5 mmol/L (ref 3.5–5.1)
SODIUM: 142 mmol/L (ref 135–145)
Total Bilirubin: 0.5 mg/dL (ref 0.3–1.2)
Total Protein: 5.3 g/dL — ABNORMAL LOW (ref 6.5–8.1)

## 2015-05-12 LAB — CBC WITH DIFFERENTIAL/PLATELET
BASOS ABS: 0 10*3/uL (ref 0.0–0.1)
BASOS PCT: 0 %
EOS ABS: 0.2 10*3/uL (ref 0.0–0.7)
Eosinophils Relative: 3 %
HCT: 35.3 % — ABNORMAL LOW (ref 36.0–46.0)
HEMOGLOBIN: 11.6 g/dL — AB (ref 12.0–15.0)
Lymphocytes Relative: 30 %
Lymphs Abs: 1.6 10*3/uL (ref 0.7–4.0)
MCH: 30.4 pg (ref 26.0–34.0)
MCHC: 32.9 g/dL (ref 30.0–36.0)
MCV: 92.7 fL (ref 78.0–100.0)
Monocytes Absolute: 0.4 10*3/uL (ref 0.1–1.0)
Monocytes Relative: 7 %
NEUTROS PCT: 60 %
Neutro Abs: 3.2 10*3/uL (ref 1.7–7.7)
Platelets: 216 10*3/uL (ref 150–400)
RBC: 3.81 MIL/uL — AB (ref 3.87–5.11)
RDW: 14.8 % (ref 11.5–15.5)
WBC: 5.3 10*3/uL (ref 4.0–10.5)

## 2015-05-12 LAB — I-STAT TROPONIN, ED
TROPONIN I, POC: 0 ng/mL (ref 0.00–0.08)
Troponin i, poc: 0.01 ng/mL (ref 0.00–0.08)

## 2015-05-12 MED ORDER — SODIUM CHLORIDE 0.9 % IV BOLUS (SEPSIS)
1000.0000 mL | Freq: Once | INTRAVENOUS | Status: AC
  Administered 2015-05-12: 1000 mL via INTRAVENOUS

## 2015-05-12 MED ORDER — ONDANSETRON HCL 4 MG/2ML IJ SOLN
4.0000 mg | Freq: Once | INTRAMUSCULAR | Status: AC
  Administered 2015-05-12: 4 mg via INTRAVENOUS
  Filled 2015-05-12: qty 2

## 2015-05-12 NOTE — ED Notes (Signed)
Patient transported to X-ray 

## 2015-05-12 NOTE — ED Notes (Signed)
Per [facility] patient from home complaining of shortness of breath and chest pain after working outside.  Patient received 1 SL nitro en route, 250 mL NS, and 325 asa PTA.  Patient only complains of nausea at this time.  Patient alert and oriented.

## 2015-05-12 NOTE — ED Notes (Signed)
Attempted to draw labs. Informed phlebotomy and they are on their way.

## 2015-05-12 NOTE — ED Provider Notes (Signed)
CSN: [id number]     Arrival date & time 05/12/15  1848 History   First MD Initiated Contact with Patient 05/12/15 1855     Chief Complaint  Patient presents with  . Chest Pain     (Consider location/radiation/quality/duration/timing/severity/associated sxs/prior Treatment) The history is provided by the patient.  [name] is a 74 y.o. female hx of anxiety, HTN, esophageal strictures here with chest pain, shortness of breath. She took her low mark to the shop to get fixed and came back home and started mowing. She bent down and then got up and felt lightheaded dizzy and then started had some chest pain. She went down hill and it came back up and the pain got worse. She will back home and called EMS and was given aspirin, nitroglycerin and now is pain-free. Denies any history of CAD or cardiac stents or any family history of CAD.   Past Medical History  Diagnosis Date  . Arthropathy, unspecified, site unspecified   . Anxiety state, unspecified   . Headache(784.0)   . Other and unspecified hyperlipidemia   . Unspecified hypothyroidism   . Personal history of colonic polyps     adenomatous  . Internal hemorrhoids without mention of complication   . Other dysphagia   . Esophageal reflux   . Pain in joint, multiple sites   . Anal or rectal pain   . Shingles   . Esophageal stricture   . Memory disorder 09/24/2013  . Hypertension   . Sleep apnea     cpap   . Chronic airway obstruction, not elsewhere classified     patient denies on 02/27/14  . Arthritis   . Malignant neoplasm of breast (female), unspecified site     left breast   . Cataract   . Osteopenia   . Polyneuropathy in other diseases classified elsewhere ([place]) 09/24/2013  . Carpal tunnel syndrome     Bilateral   Past Surgical History  Procedure Laterality Date  . Back surgery      X2  . Mastectomy      left  . Breast lumpectomy    . Thyroidectomy    . Tubal ligation    . Laparoscopic appendectomy N/A 02/28/2014    Procedure: APPENDECTOMY LAPAROSCOPIC PARTIAL CECECTOMY FOR MUCOCELE OF APPENDIX ;  Surgeon: Michael Boston, MD;  Location: [facility];  Service: General;  Laterality: N/A;  . Eye surgery    . Appendectomy  03/2014   Family History  Problem Relation Age of Onset  . Bipolar disorder Sister   . Colon cancer Neg Hx   . Esophageal cancer Neg Hx   . Rectal cancer Neg Hx   . Stomach cancer Neg Hx   . Early death Mother   . Early death Sister    Social History  Substance Use Topics  . Smoking status: Never Smoker   . Smokeless tobacco: Never Used  . Alcohol Use: No   OB History    No data available     Review of Systems  Cardiovascular: Positive for chest pain.  All other systems reviewed and are negative.     Allergies  Atorvastatin; Meperidine hcl; Statins; and Sulfonamide derivatives  Home Medications   Prior to Admission medications   Medication Sig Start Date End Date Taking? Authorizing Provider  Apoaequorin (PREVAGEN) 10 MG CAPS Take 10 mg by mouth daily.    Yes Historical Provider, MD  aspirin 81 MG tablet Take 81 mg by mouth daily.     Yes  Historical Provider, MD  Calcium Carbonate-Vitamin D (CALCIUM 500/D) 500-125 MG-UNIT TABS Take 1 capsule by mouth daily.     Yes Historical Provider, MD  clobetasol (TEMOVATE) 0.05 % external solution Apply 1 application topically 2 (two) times a week. 02/28/15  Yes Historical Provider, MD  donepezil (ARICEPT) 10 MG tablet Take 10 mg by mouth at bedtime.  08/29/12  Yes Historical Provider, MD  ezetimibe (ZETIA) 10 MG tablet TAKE (1) TABLET BY MOUTH DAILY. Patient taking differently: Take 10 mg by mouth every evening. TAKE (1) TABLET BY MOUTH DAILY. 04/07/15  Yes Chipper Herb, MD  gabapentin (NEURONTIN) 300 MG capsule Take 300-600 mg by mouth 2 (two) times daily. Takes 1 tab in am and 2 tabs at night   Yes Historical Provider, MD  ibuprofen (ADVIL,MOTRIN) 200 MG tablet Take 400 mg by mouth every 6 (six) hours as needed (Pain).    Yes  Historical Provider, MD  levothyroxine (SYNTHROID, LEVOTHROID) 112 MCG tablet TAKE 1 TABLET BY MOUTH DAILY BEFORE BREAKFAST. 04/04/15  Yes Chipper Herb, MD  lisinopril-hydrochlorothiazide (PRINZIDE,ZESTORETIC) 10-12.5 MG tablet TAKE (1) TABLET BY MOUTH DAILY. Patient taking differently: TAKE (1) TABLET BY MOUTH DAILY IN EVENING 05/01/15  Yes Chipper Herb, MD  Multiple Vitamin (MULTIVITAMIN) capsule Take 1 capsule by mouth daily.     Yes Historical Provider, MD  nortriptyline (PAMELOR) 25 MG capsule Take 75 mg by mouth at bedtime.  05/11/12  Yes Historical Provider, MD  Omega-3 Fatty Acids (FISH OIL) 1000 MG CAPS Take 1 capsule by mouth daily.     Yes Historical Provider, MD  senna-docusate (SENOKOT S) 8.6-50 MG per tablet Take 1 tablet by mouth daily as needed for constipation.   Yes Historical Provider, MD  doxycycline (VIBRAMYCIN) 100 MG capsule Take 1 capsule (100 mg total) by mouth 2 (two) times daily. 04/22/15   Claretta Fraise, MD   BP 146/67 mmHg  Pulse 70  Temp(Src) 98.4 F (36.9 C) (Oral)  Resp 16  SpO2 97% Physical Exam  Constitutional: She is oriented to person, place, and time. She appears well-developed and well-nourished.  HENT:  Head: Normocephalic.  Mouth/Throat: Oropharynx is clear and moist.  Eyes: Conjunctivae are normal. Pupils are equal, round, and reactive to light.  Neck: Normal range of motion. Neck supple.  Cardiovascular: Normal rate, regular rhythm and normal heart sounds.   Pulmonary/Chest: Effort normal and breath sounds normal. No respiratory distress. She has no wheezes. She has no rales.  Abdominal: Soft. Bowel sounds are normal. She exhibits no distension. There is no tenderness. There is no rebound.  Musculoskeletal: Normal range of motion. She exhibits no edema or tenderness.  Neurological: She is alert and oriented to person, place, and time. No cranial nerve deficit. Coordination normal.  Skin: Skin is warm and dry.  Psychiatric: She has a normal mood  and affect. Her behavior is normal. Judgment and thought content normal.  Nursing note and vitals reviewed.   ED Course  Procedures (including critical care time) Labs Review Labs Reviewed  CBC WITH DIFFERENTIAL/PLATELET - Abnormal; Notable for the following:    RBC 3.81 (*)    Hemoglobin 11.6 (*)    HCT 35.3 (*)    All other components within normal limits  COMPREHENSIVE METABOLIC PANEL - Abnormal; Notable for the following:    Glucose, Bld 126 (*)    Calcium 8.6 (*)    Total Protein 5.3 (*)    Albumin 3.2 (*)    All other components within normal limits  Randolm Idol, ED  Randolm Idol, ED    Imaging Review Dg Chest 2 View  05/12/2015  CLINICAL DATA:  Chest pain and shortness of breath beginning today after working outside. Nausea. Personal history of breast carcinoma. EXAM: CHEST  2 VIEW COMPARISON:  07/09/2013 FINDINGS: The heart size and mediastinal contours are within normal limits. No evidence of pulmonary infiltrate or edema. Mild left basilar scarring again noted. No evidence of pneumothorax or hemothorax. IMPRESSION: Stable exam.  No active cardiopulmonary disease. Electronically Signed   By: Earle Gell M.D.   On: 05/12/2015 19:34   I have personally reviewed and evaluated these images and lab results as part of my medical decision-making.   EKG Interpretation   Date/Time:  Monday May 12 2015 18:51:43 EDT Ventricular Rate:  84 PR Interval:  142 QRS Duration: 102 QT Interval:  404 QTC Calculation: 477 R Axis:   39 Text Interpretation:  Normal sinus rhythm Normal ECG No previous ECGs  available Confirmed by Theresea Trautmann  MD, Maricruz Lucero ([id number]) on 05/12/2015 6:57:26 PM      MDM   Final diagnoses:  None   [name] is a 75 y.o. female here with chest pain, shortness of breath, now resolved. Vitals stable, not tachy. Consider ACS. Will get labs, delta trop, CXR.    11:25 PM CXR unremarkable. Trop neg x 2. Hg 11.6, baseline around 13 but denies any rectal bleeding  or melena. She is chest pain free currently. Recommend ASA 81 mg daily and outpatient stress test with PCP.   Wandra Arthurs, MD 05/12/15 [phone]

## 2015-05-12 NOTE — Discharge Instructions (Signed)
Take ASA 81 mg daily.   Your blood count is slightly low (Hg 11.3) so should be checked in a week.   You should get outpatient stress test with your doctor   See your doctor this week  Return to ER if you have worse chest pain, shortness of breath.

## 2015-05-12 NOTE — ED Notes (Signed)
Pt asked for blood pressure cuff to be on left side. Pt stated "I had a mastectomy 25 years ago and my I would prefer it to be on that side"

## 2015-05-14 ENCOUNTER — Encounter: Payer: Self-pay | Admitting: Family Medicine

## 2015-05-14 ENCOUNTER — Telehealth: Payer: Self-pay | Admitting: Family Medicine

## 2015-05-14 ENCOUNTER — Ambulatory Visit (INDEPENDENT_AMBULATORY_CARE_PROVIDER_SITE_OTHER): Payer: Medicare Other | Admitting: Family Medicine

## 2015-05-14 VITALS — BP 127/75 | HR 87 | Temp 98.1°F | Ht 67.0 in | Wt 172.8 lb

## 2015-05-14 DIAGNOSIS — M542 Cervicalgia: Secondary | ICD-10-CM | POA: Diagnosis not present

## 2015-05-14 DIAGNOSIS — R079 Chest pain, unspecified: Secondary | ICD-10-CM

## 2015-05-14 NOTE — Progress Notes (Signed)
BP 127/75 mmHg  Pulse 87  Temp(Src) 98.1 F (36.7 C) (Oral)  Ht 5\' 7"  (1.702 m)  Wt 172 lb 12.8 oz (78.382 kg)  BMI 27.06 kg/m2   Subjective:    Patient ID: [name], female    DOB: 09-Aug-1940, 75 y.o.   MRN: [id number]  HPI: [name] is a 75 y.o. female presenting on 05/14/2015 for Neck and shoulder pain and Headache   HPI Neck pain and headache and chest pain Patient has been having neck and shoulder pain and also chest pain. It sounds like the neck and shoulder pain is the worst and that it radiates around the pain is described as a sharp shooting pain that comes around to her chest and the neck and shoulder pain is more constant. She says she does have a history of ruptured disc and is concerned that that may be what is going on in her neck. She denies any numbness or weakness in either upper extremity. She denies any fevers or chills. She was in the ER on 05/12/2015 were elevated troponins and EKG and recommended referral to cardiology. Patient would also like referral to Dr. Mariel Kansky for orthopedic because of her neck.  Relevant past medical, surgical, family and social history reviewed and updated as indicated. Interim medical history since our last visit reviewed. Allergies and medications reviewed and updated.  Review of Systems  Constitutional: Negative for fever and chills.  HENT: Negative for congestion, ear discharge and ear pain.   Eyes: Negative for redness and visual disturbance.  Respiratory: Negative for cough, chest tightness and shortness of breath.   Cardiovascular: Positive for chest pain. Negative for leg swelling.  Genitourinary: Negative for dysuria and difficulty urinating.  Musculoskeletal: Positive for arthralgias and neck pain. Negative for back pain and gait problem.  Skin: Negative for rash.  Neurological: Negative for light-headedness and headaches.  Psychiatric/Behavioral: Negative for behavioral problems and agitation.  All other systems  reviewed and are negative.   Per HPI unless specifically indicated above     Medication List       This list is accurate as of: 05/14/15  4:41 PM.  Always use your most recent med list.               aspirin 81 MG tablet  Take 81 mg by mouth daily.     Calcium 500/D 500-125 MG-UNIT Tabs  Generic drug:  Calcium Carbonate-Vitamin D  Take 1 capsule by mouth daily.     clobetasol 0.05 % external solution  Commonly known as:  TEMOVATE  Apply 1 application topically 2 (two) times a week.     donepezil 10 MG tablet  Commonly known as:  ARICEPT  Take 10 mg by mouth at bedtime.     ezetimibe 10 MG tablet  Commonly known as:  ZETIA  TAKE (1) TABLET BY MOUTH DAILY.     Fish Oil 1000 MG Caps  Take 1 capsule by mouth daily.     gabapentin 300 MG capsule  Commonly known as:  NEURONTIN  Take 300-600 mg by mouth 2 (two) times daily. Takes 1 tab in am and 2 tabs at night     ibuprofen 200 MG tablet  Commonly known as:  ADVIL,MOTRIN  Take 400 mg by mouth every 6 (six) hours as needed (Pain).     levothyroxine 112 MCG tablet  Commonly known as:  SYNTHROID, LEVOTHROID  TAKE 1 TABLET BY MOUTH DAILY BEFORE BREAKFAST.     lisinopril-hydrochlorothiazide 10-12.5 MG  tablet  Commonly known as:  PRINZIDE,ZESTORETIC  TAKE (1) TABLET BY MOUTH DAILY.     multivitamin capsule  Take 1 capsule by mouth daily.     nortriptyline 25 MG capsule  Commonly known as:  PAMELOR  Take 75 mg by mouth at bedtime.     PREVAGEN 10 MG Caps  Generic drug:  Apoaequorin  Take 10 mg by mouth daily.     SENOKOT S 8.6-50 MG tablet  Generic drug:  senna-docusate  Take 1 tablet by mouth daily as needed for constipation.           Objective:    BP 127/75 mmHg  Pulse 87  Temp(Src) 98.1 F (36.7 C) (Oral)  Ht 5\' 7"  (1.702 m)  Wt 172 lb 12.8 oz (78.382 kg)  BMI 27.06 kg/m2  Wt Readings from Last 3 Encounters:  05/14/15 172 lb 12.8 oz (78.382 kg)  04/22/15 170 lb 6.4 oz (77.293 kg)  02/11/15  175 lb (79.379 kg)    Physical Exam  Constitutional: She is oriented to person, place, and time. She appears well-developed and well-nourished. No distress.  Eyes: Conjunctivae and EOM are normal. Pupils are equal, round, and reactive to light.  Neck: Neck supple. No thyromegaly present.  Cardiovascular: Normal rate, regular rhythm, normal heart sounds and intact distal pulses.   No murmur heard. Pulmonary/Chest: Effort normal and breath sounds normal. No respiratory distress. She has no wheezes. She has no rales. She exhibits no tenderness (No reproducible tenderness).  Musculoskeletal: Normal range of motion. She exhibits tenderness. She exhibits no edema.       Cervical back: She exhibits tenderness (Tenderness in bilateral paraspinal musculature and some point tenderness over C7 to T12). She exhibits normal range of motion, no bony tenderness, no deformity and normal pulse.  Lymphadenopathy:    She has no cervical adenopathy.  Neurological: She is alert and oriented to person, place, and time. Coordination normal.  Skin: Skin is warm and dry. No rash noted. She is not diaphoretic.  Psychiatric: She has a normal mood and affect. Her behavior is normal.  Nursing note and vitals reviewed.   Results for orders placed or performed during the hospital encounter of 05/12/15  CBC with Differential  Result Value Ref Range   WBC 5.3 4.0 - 10.5 K/uL   RBC 3.81 (L) 3.87 - 5.11 MIL/uL   Hemoglobin 11.6 (L) 12.0 - 15.0 g/dL   HCT 35.3 (L) 36.0 - 46.0 %   MCV 92.7 78.0 - 100.0 fL   MCH 30.4 26.0 - 34.0 pg   MCHC 32.9 30.0 - 36.0 g/dL   RDW 14.8 11.5 - 15.5 %   Platelets 216 150 - 400 K/uL   Neutrophils Relative % 60 %   Neutro Abs 3.2 1.7 - 7.7 K/uL   Lymphocytes Relative 30 %   Lymphs Abs 1.6 0.7 - 4.0 K/uL   Monocytes Relative 7 %   Monocytes Absolute 0.4 0.1 - 1.0 K/uL   Eosinophils Relative 3 %   Eosinophils Absolute 0.2 0.0 - 0.7 K/uL   Basophils Relative 0 %   Basophils Absolute  0.0 0.0 - 0.1 K/uL  Comprehensive metabolic panel  Result Value Ref Range   Sodium 142 135 - 145 mmol/L   Potassium 3.5 3.5 - 5.1 mmol/L   Chloride 107 101 - 111 mmol/L   CO2 26 22 - 32 mmol/L   Glucose, Bld 126 (H) 65 - 99 mg/dL   BUN 13 6 - 20 mg/dL  Creatinine, Ser 0.59 0.44 - 1.00 mg/dL   Calcium 8.6 (L) 8.9 - 10.3 mg/dL   Total Protein 5.3 (L) 6.5 - 8.1 g/dL   Albumin 3.2 (L) 3.5 - 5.0 g/dL   AST 20 15 - 41 U/L   ALT 21 14 - 54 U/L   Alkaline Phosphatase 51 38 - 126 U/L   Total Bilirubin 0.5 0.3 - 1.2 mg/dL   GFR calc non Af Amer >60 >60 mL/min   GFR calc Af Amer >60 >60 mL/min   Anion gap 9 5 - 15  I-stat troponin, ED  Result Value Ref Range   Troponin i, poc 0.01 0.00 - 0.08 ng/mL   Comment 3          I-stat troponin, ED  Result Value Ref Range   Troponin i, poc 0.00 0.00 - 0.08 ng/mL   Comment 3              Assessment & Plan:   Problem List Items Addressed This Visit    None    Visit Diagnoses    Neck pain    -  Primary    Midline neck pain around C7 and T1. Pain shoots around from there to her chest. We'll send to neurology    Relevant Orders    Ambulatory referral to Neurosurgery    Chest pain, unspecified chest pain type        Patient had chest pain that was sharp in nature and heaviness and was in the hospital on 05/12/2015 where they did troponins and EKGs sentence to cards     Relevant Orders    Ambulatory referral to Cardiology        Follow up plan: Return if symptoms worsen or fail to improve.  Counseling provided for all of the vaccine components Orders Placed This Encounter  Procedures  . Ambulatory referral to Cardiology  . Ambulatory referral to Neurosurgery    Caryl Pina, MD [facility] 05/14/2015, 4:41 PM

## 2015-05-14 NOTE — Telephone Encounter (Signed)
Patient requesting to see [facility] but not appts available. Appt made with Dr Dettinger today

## 2015-05-22 ENCOUNTER — Encounter: Payer: Self-pay | Admitting: Family Medicine

## 2015-05-22 ENCOUNTER — Ambulatory Visit (INDEPENDENT_AMBULATORY_CARE_PROVIDER_SITE_OTHER): Payer: Medicare Other | Admitting: Family Medicine

## 2015-05-22 ENCOUNTER — Ambulatory Visit (INDEPENDENT_AMBULATORY_CARE_PROVIDER_SITE_OTHER): Payer: Medicare Other

## 2015-05-22 VITALS — BP 101/61 | HR 84 | Temp 97.7°F | Ht 67.0 in | Wt 171.0 lb

## 2015-05-22 DIAGNOSIS — Z1211 Encounter for screening for malignant neoplasm of colon: Secondary | ICD-10-CM

## 2015-05-22 DIAGNOSIS — G500 Trigeminal neuralgia: Secondary | ICD-10-CM

## 2015-05-22 DIAGNOSIS — R0789 Other chest pain: Secondary | ICD-10-CM

## 2015-05-22 DIAGNOSIS — E559 Vitamin D deficiency, unspecified: Secondary | ICD-10-CM

## 2015-05-22 DIAGNOSIS — G63 Polyneuropathy in diseases classified elsewhere: Secondary | ICD-10-CM | POA: Diagnosis not present

## 2015-05-22 DIAGNOSIS — K219 Gastro-esophageal reflux disease without esophagitis: Secondary | ICD-10-CM

## 2015-05-22 DIAGNOSIS — E785 Hyperlipidemia, unspecified: Secondary | ICD-10-CM

## 2015-05-22 DIAGNOSIS — Z13820 Encounter for screening for osteoporosis: Secondary | ICD-10-CM

## 2015-05-22 DIAGNOSIS — G3184 Mild cognitive impairment, so stated: Secondary | ICD-10-CM

## 2015-05-22 DIAGNOSIS — E890 Postprocedural hypothyroidism: Secondary | ICD-10-CM | POA: Diagnosis not present

## 2015-05-22 DIAGNOSIS — I10 Essential (primary) hypertension: Secondary | ICD-10-CM | POA: Diagnosis not present

## 2015-05-22 DIAGNOSIS — Z780 Asymptomatic menopausal state: Secondary | ICD-10-CM

## 2015-05-22 DIAGNOSIS — E538 Deficiency of other specified B group vitamins: Secondary | ICD-10-CM | POA: Diagnosis not present

## 2015-05-22 DIAGNOSIS — R1310 Dysphagia, unspecified: Secondary | ICD-10-CM

## 2015-05-22 DIAGNOSIS — C50119 Malignant neoplasm of central portion of unspecified female breast: Secondary | ICD-10-CM

## 2015-05-22 NOTE — Progress Notes (Signed)
Subjective:    Patient ID: [name], female    DOB: 30-May-1940, 75 y.o.   MRN: [id number]  HPI Pt here for follow up and management of chronic medical problems which includes hyperlipidemia, hypothyroid, and hypertension. She is taking medications regularly.The patient is doing well overall. She is followed regularly by the neurologist for her trigeminal neuralgia. She is also followed regularly by the oncologist because of her malignant neoplasm of the breast. She is taking medication to help her memory. She has no specific complaints today. She has had lab work done and we will review this with her this is from March 31 of this year. She is due to get a DEXA scan and will try to do this today. She is also due to return an FOBT. The patient recently had a visit to the emergency room because of chest pressure and tightness. No objective finding was found to explain her symptoms at that time. They told her that she needed to follow-up with the cardiologist. She has not had any other episodes since this visit. She has not had any shortness of breath. She denies any problems with heartburn or indigestion but does have trouble swallowing certain types of foods and feels like these hang up in her mid chest area. She has no blood in the stool or black tarry bowel movements and bowel habits have otherwise been normal. She is passing her water without problems. She sees the neurologist regularly regarding her trigeminal neuralgia and this is every 6 months. She was somewhat emotional today talking about him and older lady whom she is been close to her and who has been like a mother to her low as cancer.      Patient Active Problem List   Diagnosis Date Noted  . 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 Polyneuropathy in other diseases classified elsewhere ([place]) 09/24/2013  . Memory disorder 09/24/2013  . Sleep apnea 05/15/2013  . Hypothyroidism, postsurgical 05/15/2013   . HTN (hypertension) 05/15/2013  . Vitamin D deficiency 05/15/2013  . Hyperlipidemia 05/15/2013  . Osteopenia 10/25/2012  . Mild cognitive impairment with memory loss 09/27/2012  . Trigeminal neuralgia 04/02/2011  . Dysphagia 07/23/2010  . GERD (gastroesophageal reflux disease) 07/23/2010  . Right facial pain 07/23/2010  . ANXIETY 05/24/2008  . COPD 05/24/2008  . CONSTIPATION 05/24/2008  . ARTHRITIS 05/24/2008  . Idiopathic osteoporosis 05/24/2008  . ADENOCARCINOMA, BREAST 05/22/2008  . INTERNAL HEMORRHOIDS 05/22/2008  . COLONIC POLYPS, ADENOMATOUS, HX OF 05/22/2008   Outpatient Encounter Prescriptions as of 05/22/2015  Medication Sig  . Apoaequorin (PREVAGEN) 10 MG CAPS Take 10 mg by mouth daily.   Marland Kitchen aspirin 81 MG tablet Take 81 mg by mouth daily.    . Calcium Carbonate-Vitamin D (CALCIUM 500/D) 500-125 MG-UNIT TABS Take 1 capsule by mouth daily.    . clobetasol (TEMOVATE) 0.05 % external solution Apply 1 application topically 2 (two) times a week.  . donepezil (ARICEPT) 10 MG tablet Take 10 mg by mouth at bedtime.   Marland Kitchen ezetimibe (ZETIA) 10 MG tablet TAKE (1) TABLET BY MOUTH DAILY. (Patient taking differently: Take 10 mg by mouth every evening. TAKE (1) TABLET BY MOUTH DAILY.)  . gabapentin (NEURONTIN) 300 MG capsule Take 300-600 mg by mouth 2 (two) times daily. Takes 1 tab in am and 2 tabs at night  . ibuprofen (ADVIL,MOTRIN) 200 MG tablet Take 400 mg by mouth every 6 (six) hours as needed (Pain).   Marland Kitchen levothyroxine (SYNTHROID, LEVOTHROID)  112 MCG tablet TAKE 1 TABLET BY MOUTH DAILY BEFORE BREAKFAST.  Marland Kitchen lisinopril-hydrochlorothiazide (PRINZIDE,ZESTORETIC) 10-12.5 MG tablet TAKE (1) TABLET BY MOUTH DAILY. (Patient taking differently: TAKE (1) TABLET BY MOUTH DAILY IN EVENING)  . Multiple Vitamin (MULTIVITAMIN) capsule Take 1 capsule by mouth daily.    . nortriptyline (PAMELOR) 25 MG capsule Take 75 mg by mouth at bedtime.   . Omega-3 Fatty Acids (FISH OIL) 1000 MG CAPS Take 1 capsule  by mouth daily.    Marland Kitchen senna-docusate (SENOKOT S) 8.6-50 MG per tablet Take 1 tablet by mouth daily as needed for constipation.   No facility-administered encounter medications on file as of 05/22/2015.     Review of Systems  Constitutional: Negative.   HENT: Negative.   Eyes: Negative.   Respiratory: Negative.   Cardiovascular: Negative.   Gastrointestinal: Negative.   Endocrine: Negative.   Genitourinary: Negative.   Musculoskeletal: Negative.   Skin: Negative.   Allergic/Immunologic: Negative.   Neurological: Negative.   Hematological: Negative.   Psychiatric/Behavioral: Negative.        Objective:   Physical Exam  Constitutional: She is oriented to person, place, and time. She appears well-developed and well-nourished. No distress.  HENT:  Head: Normocephalic and atraumatic.  Right Ear: External ear normal.  Left Ear: External ear normal.  Nose: Nose normal.  Mouth/Throat: Oropharynx is clear and moist.  Eyes: Conjunctivae and EOM are normal. Pupils are equal, round, and reactive to light. Right eye exhibits no discharge. Left eye exhibits no discharge. No scleral icterus.  Neck: Normal range of motion. Neck supple. No thyromegaly present.  No bruits thyromegaly  Cardiovascular: Normal rate, regular rhythm, normal heart sounds and intact distal pulses.   No murmur heard. The heart is regular at 72/m  Pulmonary/Chest: Effort normal and breath sounds normal. No respiratory distress. She has no wheezes. She has no rales. She exhibits no tenderness.  Clear anteriorly and posteriorly  Abdominal: Soft. Bowel sounds are normal. She exhibits no mass. There is no tenderness. There is no rebound and no guarding.  No liver or spleen enlargement and no epigastric tenderness and no inguinal adenopathy  Musculoskeletal: Normal range of motion. She exhibits no edema.  Lymphadenopathy:    She has no cervical adenopathy.  Neurological: She is alert and oriented to person, place, and  time. She has normal reflexes. No cranial nerve deficit.  Skin: Skin is warm and dry. No rash noted.  Psychiatric: She has a normal mood and affect. Her behavior is normal. Judgment and thought content normal.  Somewhat tearful.  Nursing note and vitals reviewed.  BP 101/61 mmHg  Pulse 84  Temp(Src) 97.7 F (36.5 C) (Oral)  Ht 5\' 7"  (1.702 m)  Wt 171 lb (77.565 kg)  BMI 26.78 kg/m2        Assessment & Plan:  1. Hyperlipidemia -Continue with aggressive therapeutic lifestyle changes as patient is statin intolerant - Ambulatory referral to Cardiology  2. Essential hypertension -The blood pressure is good today. She will continue with current treatment - Ambulatory referral to Cardiology  3. Hypothyroidism, postsurgical -Continue with current treatment  4. Vitamin D deficiency -Continue with vitamin D plus calcium - DG [facility] DEXA; Future  5. Gastroesophageal reflux disease, esophagitis presence not specified -Referred to gastroenterology because of problems with swallowing certain foods  6. B12 deficiency -Check B12 level at next visit  7. Special screening for malignant neoplasms, colon - Fecal occult blood, imunochemical; Future  8. Screening for osteoporosis -DEXA scan will be done  today. - DG [facility] DEXA; Future  9. Postmenopausal - DG [facility] DEXA; Future  10. Mild cognitive impairment with memory loss -Continue with neurology follow-up  11. Trigeminal neuralgia -Continue with neurology follow-up  12. Polyneuropathy in other diseases classified elsewhere [facility]) -Follow-up with neurology as planned  13. Malignant neoplasm of central portion of female breast, unspecified laterality ([place]) -The patient is up-to-date on her mammograms  14. Dysphagia - Ambulatory referral to Gastroenterology  15. Chest tightness - Ambulatory referral to Cardiology   Patient Instructions                       Medicare Annual Wellness Visit  [facility] and the medical  providers at [facility] strive to bring you the best medical care.  In doing so we not only want to address your current medical conditions and concerns but also to detect new conditions early and prevent illness, disease and health-related problems.    Medicare offers a yearly Wellness Visit which allows our clinical staff to assess your need for preventative services including immunizations, lifestyle education, counseling to decrease risk of preventable diseases and screening for fall risk and other medical concerns.    This visit is provided free of charge (no copay) for all Medicare recipients. The clinical pharmacists at [facility] have begun to conduct these Wellness Visits which will also include a thorough review of all your medications.    As you primary medical provider recommend that you make an appointment for your Annual Wellness Visit if you have not done so already this year.  You may set up this appointment before you leave today or you may call back [phone] and schedule an appointment.  Please make sure when you call that you mention that you are scheduling your Annual Wellness Visit with the clinical pharmacist so that the appointment may be made for the proper length of time.     Continue current medications. Continue good therapeutic lifestyle changes which include good diet and exercise. Fall precautions discussed with patient. If an FOBT was given today- please return it to our front desk. If you are over 78 years old - you may need Prevnar 12 or the adult Pneumonia vaccine.  **Flu shots are available--- please call and schedule a FLU-CLINIC appointment**  After your visit with [place] today you will receive a survey in the mail or online from [employer] regarding your care with [place]. Please take a moment to fill this out. Your feedback is very important to [place] as you can help [place] better understand your patient needs as well as  improve your experience and satisfaction. WE CARE ABOUT YOU!!!   Because of the recent episode with chest pressure and tightness we will arrange for you to see the cardiologist to further evaluate this and because of the recommendation from the emergency room. We will also arrange for you to see Dr. Elmo Putt as she has symptoms with swallowing spaces certain types of food and this may be contributing to some of the chest pressure also He should continue to take the Prevacid regularly before you see the gastroenterologist and let him know that this is helping or not Stay active and keep walking and do the best she can with therapeutic lifestyle changes to help with your cholesterol since you are statin intolerant Continue to be careful do not put yourself at risk for falling Return the FOBT We will call with the DEXA results as  soon as they become available   Arrie Senate MD

## 2015-05-22 NOTE — Patient Instructions (Addendum)
Medicare Annual Wellness Visit  [facility] and the medical providers at [facility] strive to bring you the best medical care.  In doing so we not only want to address your current medical conditions and concerns but also to detect new conditions early and prevent illness, disease and health-related problems.    Medicare offers a yearly Wellness Visit which allows our clinical staff to assess your need for preventative services including immunizations, lifestyle education, counseling to decrease risk of preventable diseases and screening for fall risk and other medical concerns.    This visit is provided free of charge (no copay) for all Medicare recipients. The clinical pharmacists at [facility] have begun to conduct these Wellness Visits which will also include a thorough review of all your medications.    As you primary medical provider recommend that you make an appointment for your Annual Wellness Visit if you have not done so already this year.  You may set up this appointment before you leave today or you may call back [phone] and schedule an appointment.  Please make sure when you call that you mention that you are scheduling your Annual Wellness Visit with the clinical pharmacist so that the appointment may be made for the proper length of time.     Continue current medications. Continue good therapeutic lifestyle changes which include good diet and exercise. Fall precautions discussed with patient. If an FOBT was given today- please return it to our front desk. If you are over 67 years old - you may need Prevnar 17 or the adult Pneumonia vaccine.  **Flu shots are available--- please call and schedule a FLU-CLINIC appointment**  After your visit with [place] today you will receive a survey in the mail or online from [employer] regarding your care with [place]. Please take a moment to fill this out. Your feedback is very  important to [place] as you can help [place] better understand your patient needs as well as improve your experience and satisfaction. WE CARE ABOUT YOU!!!   Because of the recent episode with chest pressure and tightness we will arrange for you to see the cardiologist to further evaluate this and because of the recommendation from the emergency room. We will also arrange for you to see Dr. Elmo Putt as she has symptoms with swallowing spaces certain types of food and this may be contributing to some of the chest pressure also He should continue to take the Prevacid regularly before you see the gastroenterologist and let him know that this is helping or not Stay active and keep walking and do the best she can with therapeutic lifestyle changes to help with your cholesterol since you are statin intolerant Continue to be careful do not put yourself at risk for falling Return the FOBT We will call with the DEXA results as soon as they become available

## 2015-06-26 NOTE — Progress Notes (Signed)
Cardiology Office Note   Date:  06/27/2015   ID:  [name], DOB April 16, 1940, MRN [id number]  PCP:  Leslie Gainer, MD  Cardiologist:   Minus Breeding, MD   Chief Complaint  Patient presents with  . Dizziness  . Shortness of Breath  . Chest Pain      History of Present Illness: [name] is a 75 y.o. female who presents for evaluation of chest pain.  I saw her in 2015. She had some bradycardia during sleep study.  However, no further cardiac testing was indicated.  She was in the emergency room in early May and I reviewed these records for this visit.  Her workup included negative troponins 2. EKG was  unremarkable. It was suggested that she presented to her primary physician and consider stress testing. She presents for follow-up. She reports that she felt like she was vomiting on that day. She felt lightheaded. She has some chest pressure. She also called EMS and her blood pressure apparently elevated. She went to the emergency room.  The results were as above. She continues to feel "weak and wobbly." She has some sporadic sharp pain under the left breast.  She has some vague occasional chest pressure and dyspnea but she can still do her gardening and walking in her yard without significant limitations.   Past Medical History  Diagnosis Date  . Arthropathy, unspecified, site unspecified   . Anxiety state, unspecified   . Other and unspecified hyperlipidemia   . Unspecified hypothyroidism   . Personal history of colonic polyps     adenomatous  . Internal hemorrhoids without mention of complication   . Esophageal reflux   . Pain in joint, multiple sites   . Anal or rectal pain   . Shingles   . Esophageal stricture   . Memory disorder 09/24/2013  . Hypertension   . Sleep apnea     cpap   . Chronic airway obstruction, not elsewhere classified     patient denies on 02/27/14  . Arthritis   . Malignant neoplasm of breast (female), unspecified site     left breast   .  Cataract   . Osteopenia   . Polyneuropathy in other diseases classified elsewhere ([place]) 09/24/2013  . Carpal tunnel syndrome     Bilateral    Past Surgical History  Procedure Laterality Date  . Back surgery      X2  . Mastectomy      left  . Breast lumpectomy    . Thyroidectomy    . Tubal ligation    . Laparoscopic appendectomy N/A 02/28/2014    Procedure: APPENDECTOMY LAPAROSCOPIC PARTIAL CECECTOMY FOR MUCOCELE OF APPENDIX ;  Surgeon: Michael Boston, MD;  Location: [facility];  Service: General;  Laterality: N/A;  . Eye surgery    . Appendectomy  03/2014     Current Outpatient Prescriptions  Medication Sig Dispense Refill  . Apoaequorin (PREVAGEN) 10 MG CAPS Take 10 mg by mouth daily.     Marland Kitchen aspirin 81 MG tablet Take 81 mg by mouth daily.      . Calcium Carbonate-Vitamin D (CALCIUM 500/D) 500-125 MG-UNIT TABS Take 1 capsule by mouth daily.      . clobetasol (TEMOVATE) 0.05 % external solution Apply 1 application topically 2 (two) times a week.    . donepezil (ARICEPT) 10 MG tablet Take 10 mg by mouth at bedtime.     Marland Kitchen ezetimibe (ZETIA) 10 MG tablet TAKE (1) TABLET BY MOUTH DAILY. (Patient  taking differently: Take 10 mg by mouth every evening. TAKE (1) TABLET BY MOUTH DAILY.) 30 tablet 5  . gabapentin (NEURONTIN) 300 MG capsule Take 300-600 mg by mouth 2 (two) times daily. Takes 1 tab in am and 2 tabs at night    . ibuprofen (ADVIL,MOTRIN) 200 MG tablet Take 400 mg by mouth every 6 (six) hours as needed (Pain).     Marland Kitchen levothyroxine (SYNTHROID, LEVOTHROID) 112 MCG tablet TAKE 1 TABLET BY MOUTH DAILY BEFORE BREAKFAST. 30 tablet 2  . lisinopril-hydrochlorothiazide (PRINZIDE,ZESTORETIC) 10-12.5 MG tablet TAKE (1) TABLET BY MOUTH DAILY. (Patient taking differently: TAKE (1) TABLET BY MOUTH DAILY IN EVENING) 30 tablet 3  . Multiple Vitamin (MULTIVITAMIN) capsule Take 1 capsule by mouth daily.      . nortriptyline (PAMELOR) 25 MG capsule Take 75 mg by mouth at bedtime.     . Omega-3 Fatty Acids  (FISH OIL) 1000 MG CAPS Take 1 capsule by mouth daily.      Marland Kitchen senna-docusate (SENOKOT S) 8.6-50 MG per tablet Take 1 tablet by mouth daily as needed for constipation.     No current facility-administered medications for this visit.    Allergies:   Atorvastatin; Meperidine hcl; Statins; and Sulfonamide derivatives    ROS:  Please see the history of present illness.   Otherwise, review of systems are positive for none.   All other systems are reviewed and negative.    PHYSICAL EXAM: VS:  BP 128/70 mmHg  Pulse 52  Ht 5\' 9"  (1.753 m)  Wt 173 lb (78.472 kg)  BMI 25.54 kg/m2 , BMI Body mass index is 25.54 kg/(m^2). GENERAL:  Well appearing HEENT:  Pupils equal round and reactive, fundi not visualized, oral mucosa unremarkable NECK:  No jugular venous distention, waveform within normal limits, carotid upstroke brisk and symmetric, no bruits, no thyromegaly LYMPHATICS:  No cervical, inguinal adenopathy LUNGS:  Clear to auscultation bilaterally BACK:  No CVA tenderness CHEST:  Unremarkable HEART:  PMI not displaced or sustained,S1 and S2 within normal limits, no S3, no S4, no clicks, no rubs, no murmurs ABD:  Flat, positive bowel sounds normal in frequency in pitch, no bruits, no rebound, no guarding, no midline pulsatile mass, no hepatomegaly, no splenomegaly EXT:  2 plus pulses throughout, no edema, no cyanosis no clubbing SKIN:  No rashes no nodules NEURO:  Cranial nerves II through XII grossly intact, motor grossly intact throughout PSYCH:  Cognitively intact, oriented to person place and time    EKG:  EKG is not ordered today.     Recent Labs: 04/04/2015: TSH 2.560 05/12/2015: ALT 21; BUN 13; Creatinine, Ser 0.59; Hemoglobin 11.6*; Platelets 216; Potassium 3.5; Sodium 142    Lipid Panel    Component Value Date/Time   CHOL 244* 04/04/2015 0920   CHOL 220* 12/31/2014 1201   CHOL 228* 05/17/2012 1134   TRIG 104 04/04/2015 0920   TRIG 120 12/31/2014 1201   TRIG 88 05/17/2012  1134   HDL 57 04/04/2015 0920   HDL 57 12/31/2014 1201   HDL 55 05/17/2012 1134   CHOLHDL 4.3 04/04/2015 0920   LDLCALC 166* 04/04/2015 0920   LDLCALC 161* 09/28/2013 0823   LDLCALC 155* 05/17/2012 1134   LDLDIRECT 164* 06/07/2014 0858      Wt Readings from Last 3 Encounters:  06/27/15 173 lb (78.472 kg)  05/22/15 171 lb (77.565 kg)  05/14/15 172 lb 12.8 oz (78.382 kg)      Other studies Reviewed: Additional studies/ records that were reviewed today  include: ED records. Review of the above records demonstrates:  Please see elsewhere in the note.     ASSESSMENT AND PLAN:  CHEST PAIN:  The pain is somewhat atypical. However, she does have risk factors. I will bring the patient back for a POET (Plain Old Exercise Test). This will allow me to screen for obstructive coronary disease, risk stratify and very importantly provide a prescription for exercise.  BRADYCARDIA:  This has not been a significant problem. No change in therapy is indicated.  DYSLIPIDEMIA:  She has been intolerant of statins. Therefore, she'll continue on the meds as listed.   Current medicines are reviewed at length with the patient today.  The patient does not have concerns regarding medicines.  The following changes have been made:  no change  Labs/ tests ordered today include:   Orders Placed This Encounter  Procedures  . EXERCISE TOLERANCE TEST     Disposition:   FU with me in one year.     Signed, Minus Breeding, MD  06/27/2015 9:31 AM    [facility]

## 2015-06-27 ENCOUNTER — Ambulatory Visit (INDEPENDENT_AMBULATORY_CARE_PROVIDER_SITE_OTHER): Payer: Medicare Other | Admitting: Family

## 2015-06-27 ENCOUNTER — Encounter: Payer: Self-pay | Admitting: Cardiology

## 2015-06-27 ENCOUNTER — Telehealth: Payer: Self-pay | Admitting: *Deleted

## 2015-06-27 ENCOUNTER — Ambulatory Visit (INDEPENDENT_AMBULATORY_CARE_PROVIDER_SITE_OTHER): Payer: Medicare Other | Admitting: Cardiology

## 2015-06-27 ENCOUNTER — Encounter: Payer: Self-pay | Admitting: Family

## 2015-06-27 VITALS — BP 137/73 | HR 95 | Temp 97.2°F | Ht 69.0 in | Wt 175.6 lb

## 2015-06-27 VITALS — BP 128/70 | HR 52 | Ht 69.0 in | Wt 173.0 lb

## 2015-06-27 DIAGNOSIS — R0789 Other chest pain: Secondary | ICD-10-CM

## 2015-06-27 DIAGNOSIS — S3091XA Unspecified superficial injury of lower back and pelvis, initial encounter: Secondary | ICD-10-CM

## 2015-06-27 DIAGNOSIS — W57XXXA Bitten or stung by nonvenomous insect and other nonvenomous arthropods, initial encounter: Secondary | ICD-10-CM | POA: Diagnosis not present

## 2015-06-27 MED ORDER — DOXYCYCLINE HYCLATE 100 MG PO TABS: 100.0000 mg | ORAL_TABLET | Freq: Once | ORAL | Status: DC

## 2015-06-27 NOTE — Patient Instructions (Signed)
Tick Bite Information Ticks are insects that attach themselves to the skin and draw blood for food. There are various types of ticks. Common types include wood ticks and deer ticks. Most ticks live in shrubs and grassy areas. Ticks can climb onto your body when you make contact with leaves or grass where the tick is waiting. The most common places on the body for ticks to attach themselves are the scalp, neck, armpits, waist, and groin. Most tick bites are harmless, but sometimes ticks carry germs that cause diseases. These germs can be spread to a person during the tick's feeding process. The chance of a disease spreading through a tick bite depends on:   The type of tick.  Time of year.   How long the tick is attached.   Geographic location.  HOW CAN YOU PREVENT TICK BITES? Take these steps to help prevent tick bites when you are outdoors:  Wear protective clothing. Long sleeves and long pants are best.   Wear white clothes so you can see ticks more easily.  Tuck your pant legs into your socks.   If walking on a trail, stay in the middle of the trail to avoid brushing against bushes.  Avoid walking through areas with long grass.  Put insect repellent on all exposed skin and along boot tops, pant legs, and sleeve cuffs.   Check clothing, hair, and skin repeatedly and before going inside.   Brush off any ticks that are not attached.  Take a shower or bath as soon as possible after being outdoors.  WHAT IS THE PROPER WAY TO REMOVE A TICK? Ticks should be removed as soon as possible to help prevent diseases caused by tick bites. 1. If latex gloves are available, put them on before trying to remove a tick.  2. Using fine-point tweezers, grasp the tick as close to the skin as possible. You may also use curved forceps or a tick removal tool. Grasp the tick as close to its head as possible. Avoid grasping the tick on its body. 3. Pull gently with steady upward pressure until  the tick lets go. Do not twist the tick or jerk it suddenly. This may break off the tick's head or mouth parts. 4. Do not squeeze or crush the tick's body. This could force disease-carrying fluids from the tick into your body.  5. After the tick is removed, wash the bite area and your hands with soap and water or other disinfectant such as alcohol. 6. Apply a small amount of antiseptic cream or ointment to the bite site.  7. Wash and disinfect any instruments that were used.  Do not try to remove a tick by applying a hot match, petroleum jelly, or fingernail polish to the tick. These methods do not work and may increase the chances of disease being spread from the tick bite.  WHEN SHOULD YOU SEEK MEDICAL CARE? Contact your health care provider if you are unable to remove a tick from your skin or if a part of the tick breaks off and is stuck in the skin.  After a tick bite, you need to be aware of signs and symptoms that could be related to diseases spread by ticks. Contact your health care provider if you develop any of the following in the days or weeks after the tick bite:  Unexplained fever.  Rash. A circular rash that appears days or weeks after the tick bite may indicate the possibility of Lyme disease. The rash may resemble   a target with a bull's-eye and may occur at a different part of your body than the tick bite.  Redness and swelling in the area of the tick bite.   Tender, swollen lymph glands.   Diarrhea.   Weight loss.   Cough.   Fatigue.   Muscle, joint, or bone pain.   Abdominal pain.   Headache.   Lethargy or a change in your level of consciousness.  Difficulty walking or moving your legs.   Numbness in the legs.   Paralysis.  Shortness of breath.   Confusion.   Repeated vomiting.    This information is not intended to replace advice given to you by your health care provider. Make sure you discuss any questions you have with your health  care provider.   Document Released: 12/19/1999 Document Revised: 01/11/2014 Document Reviewed: 05/31/2012 Elsevier Interactive Patient Education 2016 Elsevier Inc.  

## 2015-06-27 NOTE — Progress Notes (Signed)
   Subjective:    Patient ID: [name], female    DOB: 01-Jun-1940, 75 y.o.   MRN: [id number]  HPI Pt presents to the office today for a tick bite on her left lower back. Pt states she noticed it yesterday and is unsure how long it was attached. Pt denies any fever, new joint pain, or rash.   Review of Systems  Constitutional: Negative.   HENT: Negative.   Eyes: Negative.   Respiratory: Negative.  Negative for shortness of breath.   Cardiovascular: Negative.  Negative for palpitations.  Gastrointestinal: Negative.   Endocrine: Negative.   Genitourinary: Negative.   Musculoskeletal: Negative.   Neurological: Negative.  Negative for headaches.  Hematological: Negative.   Psychiatric/Behavioral: Negative.   All other systems reviewed and are negative.      Objective:   Physical Exam  Constitutional: She is oriented to person, place, and time. She appears well-developed and well-nourished. No distress.  HENT:  Head: Normocephalic.  Cardiovascular: Normal rate, regular rhythm, normal heart sounds and intact distal pulses.   No murmur heard. Pulmonary/Chest: Effort normal and breath sounds normal. No respiratory distress. She has no wheezes.  Abdominal: Soft. Bowel sounds are normal. She exhibits no distension. There is no tenderness.  Musculoskeletal: Normal range of motion. She exhibits no edema or tenderness.  Neurological: She is alert and oriented to person, place, and time.  Skin: Skin is warm and dry. There is erythema.  circular erythemas lesion on left lower back, 2cm  Psychiatric: She has a normal mood and affect. Her behavior is normal. Judgment and thought content normal.  Vitals reviewed.   BP 137/73 mmHg  Pulse 95  Temp(Src) 97.2 F (36.2 C) (Oral)  Ht 5\' 9"  (1.753 m)  Wt 175 lb 9.6 oz (79.652 kg)  BMI 25.92 kg/m2       Assessment & Plan:  1. Tick bite --Pt to report any new fever, joint pain, or rash -Wear protective clothing while outside- Long  sleeves and long pants -Put insect repellent on all exposed skin and along clothing -Take a shower as soon as possible after being outside -RTO prn - doxycycline (VIBRA-TABS) 100 MG tablet; Take 1 tablet (100 mg total) by mouth once.  Dispense: 2 tablet; Refill: 0  [name], FNP

## 2015-06-27 NOTE — Patient Instructions (Signed)
Medication Instructions:  Continue current medications  Labwork: NONE  Testing/Procedures: Your physician has requested that you have an exercise tolerance test. For further information please visit [email or web address]. Please also follow instruction sheet, as given.  Follow-Up: Your physician wants you to follow-up in: 1 Year. You will receive a reminder letter in the mail two months in advance. If you don't receive a letter, please call our office to schedule the follow-up appointment.   Any Other Special Instructions Will Be Listed Below (If Applicable).   If you need a refill on your cardiac medications before your next appointment, please call your pharmacy.

## 2015-06-30 ENCOUNTER — Ambulatory Visit (HOSPITAL_COMMUNITY)
Admission: RE | Admit: 2015-06-30 | Discharge: 2015-06-30 | Disposition: A | Discharge status: Home / Self Care | Payer: Medicare Other | Source: Ambulatory Visit | Attending: Cardiology | Admitting: Cardiology

## 2015-06-30 DIAGNOSIS — R0789 Other chest pain: Secondary | ICD-10-CM

## 2015-06-30 NOTE — Progress Notes (Signed)
Pt arrived for POET.  When on the treadmill with Bruce protocol, she was unable to walk at 1.7 mph and had to be held to keep from falling.   She routinely walks 1.0-1.5 miles with a friend, but cannot say how long this takes.  Tried again with modified Bruce protocol, but pt was again unable to maintain pace without falling.   Spoke w/ [facility], schedule Lexi MV.  Called office, they will schedule and call her.  Correct phone number is [phone].  Lenoard Aden 06/30/2015 2:57 PM Beeper [phone]

## 2015-07-07 ENCOUNTER — Other Ambulatory Visit: Payer: Self-pay | Admitting: Family Medicine

## 2015-07-14 ENCOUNTER — Telehealth: Payer: Self-pay | Admitting: Cardiology

## 2015-07-14 DIAGNOSIS — R079 Chest pain, unspecified: Secondary | ICD-10-CM

## 2015-07-14 NOTE — Telephone Encounter (Signed)
New message      The pt is calling about having a nuclear test, but there is no order, the pt states she could not do the tredmill, but was told that someone would call her to come in for nuclear testing she does not know what happened cause no-one has called her.

## 2015-07-14 NOTE — Telephone Encounter (Signed)
Returned call to patient. Order for test entered for patient to have at [facility]. Patient aware someone will call to schedule.  Leslie Croon Barrett, PA-C at 06/30/2015 2:52 PM     Status: Signed       Expand All Collapse All   Pt arrived for POET.  When on the treadmill with Bruce protocol, she was unable to walk at 1.7 mph and had to be held to keep from falling.   She routinely walks 1.0-1.5 miles with a friend, but cannot say how long this takes.  Tried again with modified Bruce protocol, but pt was again unable to maintain pace without falling.   Spoke w/ [facility], schedule Lexi MV.  Called office, they will schedule and call her.  Correct phone number is [phone].  Lenoard Aden 06/30/2015 2:57 PM Beeper [phone]

## 2015-07-16 ENCOUNTER — Other Ambulatory Visit: Payer: Medicare Other

## 2015-07-16 DIAGNOSIS — Z1211 Encounter for screening for malignant neoplasm of colon: Secondary | ICD-10-CM | POA: Diagnosis not present

## 2015-07-18 LAB — FECAL OCCULT BLOOD, IMMUNOCHEMICAL: Fecal Occult Bld: NEGATIVE

## 2015-07-21 DIAGNOSIS — G5603 Carpal tunnel syndrome, bilateral upper limbs: Secondary | ICD-10-CM | POA: Diagnosis not present

## 2015-07-21 DIAGNOSIS — G519 Disorder of facial nerve, unspecified: Secondary | ICD-10-CM | POA: Diagnosis not present

## 2015-07-21 DIAGNOSIS — G603 Idiopathic progressive neuropathy: Secondary | ICD-10-CM | POA: Diagnosis not present

## 2015-07-21 DIAGNOSIS — M5412 Radiculopathy, cervical region: Secondary | ICD-10-CM | POA: Diagnosis not present

## 2015-07-21 DIAGNOSIS — G5623 Lesion of ulnar nerve, bilateral upper limbs: Secondary | ICD-10-CM | POA: Diagnosis not present

## 2015-07-21 DIAGNOSIS — M5417 Radiculopathy, lumbosacral region: Secondary | ICD-10-CM | POA: Diagnosis not present

## 2015-07-22 ENCOUNTER — Encounter (HOSPITAL_COMMUNITY)
Admission: RE | Admit: 2015-07-22 | Discharge: 2015-07-22 | Disposition: A | Discharge status: Home / Self Care | Payer: Medicare Other | Source: Ambulatory Visit | Attending: Cardiology | Admitting: Cardiology

## 2015-07-22 ENCOUNTER — Inpatient Hospital Stay (HOSPITAL_COMMUNITY): Admission: RE | Admit: 2015-07-22 | Payer: Medicare Other | Source: Ambulatory Visit

## 2015-07-22 ENCOUNTER — Encounter (HOSPITAL_COMMUNITY): Payer: Self-pay

## 2015-07-22 DIAGNOSIS — R079 Chest pain, unspecified: Secondary | ICD-10-CM | POA: Diagnosis not present

## 2015-07-22 LAB — NM MYOCAR MULTI W/SPECT W/WALL MOTION / EF
CHL CUP NUCLEAR SDS: 5
CHL CUP NUCLEAR SRS: 8
CHL CUP RESTING HR STRESS: 70 {beats}/min
LV dias vol: 82 mL (ref 46–106)
LV sys vol: 29 mL
Peak HR: 100 {beats}/min
Percent HR: 68 %
RATE: 0.28
SSS: 13
TID: 0.86

## 2015-07-22 MED ORDER — REGADENOSON 0.4 MG/5ML IV SOLN
INTRAVENOUS | Status: AC
  Administered 2015-07-22: 0.4 mg
  Filled 2015-07-22: qty 5

## 2015-07-22 MED ORDER — TECHNETIUM TC 99M TETROFOSMIN IV KIT
30.0000 | PACK | Freq: Once | INTRAVENOUS | Status: AC | PRN
  Administered 2015-07-22: 30 via INTRAVENOUS

## 2015-07-22 MED ORDER — TECHNETIUM TC 99M TETROFOSMIN IV KIT
10.0000 | PACK | Freq: Once | INTRAVENOUS | Status: AC | PRN
  Administered 2015-07-22: 10 via INTRAVENOUS

## 2015-08-12 ENCOUNTER — Encounter: Payer: Self-pay | Admitting: Adult Health

## 2015-08-12 ENCOUNTER — Ambulatory Visit (INDEPENDENT_AMBULATORY_CARE_PROVIDER_SITE_OTHER): Payer: Medicare Other | Admitting: Adult Health

## 2015-08-12 VITALS — BP 122/77 | HR 75 | Ht 69.0 in | Wt 171.4 lb

## 2015-08-12 DIAGNOSIS — R413 Other amnesia: Secondary | ICD-10-CM

## 2015-08-12 DIAGNOSIS — G500 Trigeminal neuralgia: Secondary | ICD-10-CM

## 2015-08-12 NOTE — Progress Notes (Signed)
PATIENT: [name] DOB: [birth year]  REASON FOR VISIT: follow up-trigeminal neuralgia, peripheral neuropathy, memory loss HISTORY FROM: patient  HISTORY OF PRESENT ILLNESS: Ms. [name] is a 75 year old female with a history of trigeminal neuralgia, peripheral neuropathy and memory loss. She returns today for follow-up. She states that she is having some discomfort in the right side of the face for trigeminal neuralgia. She states that she also has a sinus infection that may be exacerbating this. She continues to take gabapentin 300 mg in the morning and 600 mg in the PM. She is also on nortriptyline 75 mg at bedtime. She reports that her memory has remained stable. She lives at home alone. Able to complete all ADLs independently. Denies any changes with her gait or balance. Denies any falls. She does complain of some excessive daytime sleepiness she is currently on CPAP however she states that no one manages this. Overall she feels that her symptoms have remained stable. Denies any new neurological symptoms. She returns today for an evaluation.  HISTORY 02/11/15: Mrs. [name] is a 75 year old female with a history of trigeminal neuralgia, peripheral neuropathy and memory loss. She returns today for follow-up. The patient continues to take gabapentin 300 mg in a.m. and 600 mg in the PM. She also takes nortriptyline 75 mg in the PM. She feels that this continues to work well for her. She may have mild discomfort in the lower extremities but it is tolerable. She states that she sleeps well at night. She is able to complete all ADLs independently. She also prepares her own meals and completes her own finances. She does not feel that there is any significant change with her memory. She operates a Teacher, music without difficulty. She states that she completes her own yard work without difficulty. Denies any changes with her gait or balance. Overall she feels that she is doing well. She returns today for an  evaluation.  HISTORY 08/08/14: Mrs. [name] is a 75 year old fem ale with a history of right-sided trigeminal neuralgia, peripheral neuropathy and memory loss. She returns today for follow-up. The patient continues to take gabapentin 300 mg in the a.m. and 600 mg in the PM. She also takes nortriptyline 75 mg in the PM. She reports that her neuropathy has remained stable. She states that the medications have controlled her discomfort. Her discomfort continues to extend to above the ankles. She states that she is sleeping well at night. She denies any changes with her memory. She continues to complete all ADLs independently. She operates a Teacher, music without difficulty. She denies any new neurological symptoms. She returns today for an evaluation.   REVIEW OF SYSTEMS: Out of a complete 14 system review of symptoms, the patient complains only of the following symptoms, and all other reviewed systems are negative.  See history of present illness  ALLERGIES: Allergies  Allergen Reactions  . Atorvastatin Other (See Comments)    Muscle cramps  . Meperidine Hcl Nausea And Vomiting    Makes me 'deathly' sick  . Statins Other (See Comments)    REACTION: Patient cannot tolerate due cramps  . Sulfonamide Derivatives Other (See Comments)    Unknown reaction    HOME MEDICATIONS: Outpatient Medications Prior to Visit  Medication Sig Dispense Refill  . Apoaequorin (PREVAGEN) 10 MG CAPS Take 10 mg by mouth daily.     Marland Kitchen aspirin 81 MG tablet Take 81 mg by mouth daily.      . Calcium Carbonate-Vitamin D (CALCIUM 500/D) 500-125 MG-UNIT  TABS Take 1 capsule by mouth daily.      . clobetasol (TEMOVATE) 0.05 % external solution Apply 1 application topically 2 (two) times a week.    . donepezil (ARICEPT) 10 MG tablet Take 10 mg by mouth at bedtime.     Marland Kitchen doxycycline (VIBRA-TABS) 100 MG tablet Take 1 tablet (100 mg total) by mouth once. 2 tablet 0  . ezetimibe (ZETIA) 10 MG tablet TAKE (1) TABLET BY MOUTH  DAILY. (Patient taking differently: Take 10 mg by mouth every evening. TAKE (1) TABLET BY MOUTH DAILY.) 30 tablet 5  . gabapentin (NEURONTIN) 300 MG capsule Take 300-600 mg by mouth 2 (two) times daily. Takes 1 tab in am and 2 tabs at night    . ibuprofen (ADVIL,MOTRIN) 200 MG tablet Take 400 mg by mouth every 6 (six) hours as needed (Pain).     Marland Kitchen levothyroxine (SYNTHROID, LEVOTHROID) 112 MCG tablet TAKE 1 TABLET BY MOUTH DAILY BEFORE BREAKFAST. 30 tablet 8  . lisinopril-hydrochlorothiazide (PRINZIDE,ZESTORETIC) 10-12.5 MG tablet TAKE (1) TABLET BY MOUTH DAILY. (Patient taking differently: TAKE (1) TABLET BY MOUTH DAILY IN EVENING) 30 tablet 3  . Multiple Vitamin (MULTIVITAMIN) capsule Take 1 capsule by mouth daily.      . nortriptyline (PAMELOR) 25 MG capsule Take 75 mg by mouth at bedtime.     . Omega-3 Fatty Acids (FISH OIL) 1000 MG CAPS Take 1 capsule by mouth daily.      Marland Kitchen senna-docusate (SENOKOT S) 8.6-50 MG per tablet Take 1 tablet by mouth daily as needed for constipation.     No facility-administered medications prior to visit.     PAST MEDICAL HISTORY: Past Medical History:  Diagnosis Date  . Anal or rectal pain   . Anxiety state, unspecified   . Arthritis   . Arthropathy, unspecified, site unspecified   . Carpal tunnel syndrome    Bilateral  . Cataract   . Chronic airway obstruction, not elsewhere classified    patient denies on 02/27/14  . Esophageal reflux   . Esophageal stricture   . Hypertension   . Internal hemorrhoids without mention of complication   . Malignant neoplasm of breast (female), unspecified site    left breast   . Memory disorder 09/24/2013  . Osteopenia   . Other and unspecified hyperlipidemia   . Pain in joint, multiple sites   . Personal history of colonic polyps    adenomatous  . Polyneuropathy in other diseases classified elsewhere ([place]) 09/24/2013  . Shingles   . Sleep apnea    cpap   . Unspecified hypothyroidism     PAST SURGICAL  HISTORY: Past Surgical History:  Procedure Laterality Date  . APPENDECTOMY  03/2014  . BACK SURGERY     X2  . BREAST LUMPECTOMY    . EYE SURGERY    . LAPAROSCOPIC APPENDECTOMY N/A 02/28/2014   Procedure: APPENDECTOMY LAPAROSCOPIC PARTIAL CECECTOMY FOR MUCOCELE OF APPENDIX ;  Surgeon: Michael Boston, MD;  Location: [facility];  Service: General;  Laterality: N/A;  . MASTECTOMY     left  . THYROIDECTOMY    . TUBAL LIGATION      FAMILY HISTORY: Family History  Problem Relation Age of Onset  . Bipolar disorder Sister   . Colon cancer Neg Hx   . Esophageal cancer Neg Hx   . Rectal cancer Neg Hx   . Stomach cancer Neg Hx   . Early death Mother   . Early death Sister     SOCIAL HISTORY: Social  History   Social History  . Marital status: Divorced    Spouse name: N/A  . Number of children: 2  . Years of education: N/A   Occupational History  . Retired Retired   Social History Main Topics  . Smoking status: Never Smoker  . Smokeless tobacco: Never Used  . Alcohol use No  . Drug use: No  . Sexual activity: No   Other Topics Concern  . Not on file   Social History Narrative   Patient lives at home alone and she is divorced.   Retired.   Education business course.   Right handed.   Caffeine sometimes tea.       PHYSICAL EXAM  Vitals:   08/12/15 1117  BP: 122/77  Pulse: 75  Weight: 171 lb 6.4 oz (77.7 kg)  Height: 5\' 9"  (1.753 m)   Body mass index is 25.31 kg/m.   MMSE - Mini Mental State Exam 08/12/2015 02/11/2015 08/08/2014  Orientation to time 5 5 5   Orientation to Place 5 5 5   Registration 3 3 3   Attention/ Calculation 5 3 5   Recall 2 3 3   Language- name 2 objects 2 2 2   Language- repeat 1 0 1  Language- follow 3 step command 3 3 3   Language- read & follow direction 1 1 1   Write a sentence 1 1 1   Copy design 1 1 1   Total score 29 27 30      Generalized: Well developed, in no acute distress   Neurological examination  Mentation: Alert oriented to  time, place, history taking. Follows all commands speech and language fluent Cranial nerve II-XII: Pupils were equal round reactive to light. Extraocular movements were full, visual field were full on confrontational test. Facial sensation and strength were normal. Uvula tongue midline. Head turning and shoulder shrug  were normal and symmetric. Motor: The motor testing reveals 5 over 5 strength of all 4 extremities. Good symmetric motor tone is noted throughout.  Sensory: Sensory testing is intact to soft touch on all 4 extremities. No evidence of extinction is noted.  Coordination: Cerebellar testing reveals good finger-nose-finger and heel-to-shin bilaterally.  Gait and station: Gait is normal. Tandem gait is unsteady. Romberg is negative. No drift is seen.  Reflexes: Deep tendon reflexes are symmetric and normal bilaterally.   DIAGNOSTIC DATA (LABS, IMAGING, TESTING) - I reviewed patient records, labs, notes, testing and imaging myself where available.  Lab Results  Component Value Date   WBC 5.3 05/12/2015   HGB 11.6 (L) 05/12/2015   HCT 35.3 (L) 05/12/2015   MCV 92.7 05/12/2015   PLT 216 05/12/2015      Component Value Date/Time   NA 142 05/12/2015 2012   NA 143 04/04/2015 0920   NA 144 04/23/2013 1549   K 3.5 05/12/2015 2012   K 3.5 04/23/2013 1549   CL 107 05/12/2015 2012   CO2 26 05/12/2015 2012   CO2 25 04/23/2013 1549   GLUCOSE 126 (H) 05/12/2015 2012   GLUCOSE 97 04/23/2013 1549   BUN 13 05/12/2015 2012   BUN 15 04/04/2015 0920   BUN 20.1 04/23/2013 1549   CREATININE 0.59 05/12/2015 2012   CREATININE 0.7 04/23/2013 1549   CALCIUM 8.6 (L) 05/12/2015 2012   CALCIUM 10.3 04/23/2013 1549   PROT 5.3 (L) 05/12/2015 2012   PROT 6.6 04/04/2015 0920   PROT 6.8 04/23/2013 1549   ALBUMIN 3.2 (L) 05/12/2015 2012   ALBUMIN 4.5 04/04/2015 0920   ALBUMIN 4.1 04/23/2013 1549  AST 20 05/12/2015 2012   AST 31 04/23/2013 1549   ALT 21 05/12/2015 2012   ALT 32 04/23/2013 1549    ALKPHOS 51 05/12/2015 2012   ALKPHOS 74 04/23/2013 1549   BILITOT 0.5 05/12/2015 2012   BILITOT 0.7 04/04/2015 0920   BILITOT 0.53 04/23/2013 1549   GFRNONAA >60 05/12/2015 2012   GFRNONAA >89 07/10/2012 1056   GFRAA >60 05/12/2015 2012   GFRAA >89 07/10/2012 1056   Lab Results  Component Value Date   CHOL 244 (H) 04/04/2015   HDL 57 04/04/2015   LDLCALC 166 (H) 04/04/2015   LDLDIRECT 164 (H) 06/07/2014   TRIG 104 04/04/2015   CHOLHDL 4.3 04/04/2015         ASSESSMENT AND PLAN 75 y.o. year old female  has a past medical history of Anal or rectal pain; Anxiety state, unspecified; Arthritis; Arthropathy, unspecified, site unspecified; Carpal tunnel syndrome; Cataract; Chronic airway obstruction, not elsewhere classified; Esophageal reflux; Esophageal stricture; Hypertension; Internal hemorrhoids without mention of complication; Malignant neoplasm of breast (female), unspecified site; Memory disorder (09/24/2013); Osteopenia; Other and unspecified hyperlipidemia; Pain in joint, multiple sites; Personal history of colonic polyps; Polyneuropathy in other diseases classified elsewhere ([place]) (09/24/2013); Shingles; Sleep apnea; and Unspecified hypothyroidism. here with:  1. Trigeminal neuralgia 2. Peripheral neuropathy 3. memory disturbance  The patient will remain on gabapentin 300 in the morning and 600 in the evening. At this time she does not want to increase her medication. If her facial pain does not subside she will let us know. Her memory score has remained stable. We will continue to monitor. Patient advised that she should follow-up with her primary care in regards to her sleep apnea. She voiced understanding. She will follow-up in 6 months or sooner if needed.    Ward Givens, MSN, NP-C 08/12/2015, 11:46 AM [facility] [address], [place], [place] [address] [phone]

## 2015-08-12 NOTE — Progress Notes (Signed)
I have read the note, and I agree with the clinical assessment and plan.  Sholanda Croson KEITH   

## 2015-08-12 NOTE — Patient Instructions (Signed)
Continue gabapentin and nortriptyline Memory score is stable Follow-up with PCP in regards to CPAP If your symptoms worsen or you develop new symptoms please let us know.

## 2015-09-04 ENCOUNTER — Other Ambulatory Visit: Payer: Self-pay | Admitting: Family Medicine

## 2015-10-01 ENCOUNTER — Encounter: Payer: Self-pay | Admitting: Family Medicine

## 2015-10-01 ENCOUNTER — Ambulatory Visit (INDEPENDENT_AMBULATORY_CARE_PROVIDER_SITE_OTHER): Payer: Medicare Other | Admitting: Family Medicine

## 2015-10-01 VITALS — BP 104/64 | HR 79 | Temp 97.3°F | Ht 69.0 in | Wt 168.0 lb

## 2015-10-01 DIAGNOSIS — E559 Vitamin D deficiency, unspecified: Secondary | ICD-10-CM | POA: Diagnosis not present

## 2015-10-01 DIAGNOSIS — E785 Hyperlipidemia, unspecified: Secondary | ICD-10-CM

## 2015-10-01 DIAGNOSIS — Z23 Encounter for immunization: Secondary | ICD-10-CM

## 2015-10-01 DIAGNOSIS — I10 Essential (primary) hypertension: Secondary | ICD-10-CM

## 2015-10-01 DIAGNOSIS — E538 Deficiency of other specified B group vitamins: Secondary | ICD-10-CM

## 2015-10-01 DIAGNOSIS — E890 Postprocedural hypothyroidism: Secondary | ICD-10-CM | POA: Diagnosis not present

## 2015-10-01 DIAGNOSIS — K219 Gastro-esophageal reflux disease without esophagitis: Secondary | ICD-10-CM

## 2015-10-01 MED ORDER — BUSPIRONE HCL 30 MG PO TABS: ORAL_TABLET | ORAL | 1 refills | Status: DC

## 2015-10-01 NOTE — Addendum Note (Signed)
Addended by: Zannie Cove on: 10/01/2015 11:31 AM   Modules accepted: Orders

## 2015-10-01 NOTE — Patient Instructions (Addendum)
Medicare Annual Wellness Visit  [facility] and the medical providers at [facility] strive to bring you the best medical care.  In doing so we not only want to address your current medical conditions and concerns but also to detect new conditions early and prevent illness, disease and health-related problems.    Medicare offers a yearly Wellness Visit which allows our clinical staff to assess your need for preventative services including immunizations, lifestyle education, counseling to decrease risk of preventable diseases and screening for fall risk and other medical concerns.    This visit is provided free of charge (no copay) for all Medicare recipients. The clinical pharmacists at [facility] have begun to conduct these Wellness Visits which will also include a thorough review of all your medications.    As you primary medical provider recommend that you make an appointment for your Annual Wellness Visit if you have not done so already this year.  You may set up this appointment before you leave today or you may call back [phone] and schedule an appointment.  Please make sure when you call that you mention that you are scheduling your Annual Wellness Visit with the clinical pharmacist so that the appointment may be made for the proper length of time.     Continue current medications. Continue good therapeutic lifestyle changes which include good diet and exercise. Fall precautions discussed with patient. If an FOBT was given today- please return it to our front desk. If you are over 54 years old - you may need Prevnar 27 or the adult Pneumonia vaccine.  **Flu shots are available--- please call and schedule a FLU-CLINIC appointment**  After your visit with [place] today you will receive a survey in the mail or online from [employer] regarding your care with [place]. Please take a moment to fill this out. Your feedback is very  important to [place] as you can help [place] better understand your patient needs as well as improve your experience and satisfaction. WE CARE ABOUT YOU!!!   The flu shot that he received today may make your arm sore Continue to follow-up with neurology for trigeminal neuralgia and neuropathy Do not forget to get your mammogram in October We will call with lab work results as soon as they become available

## 2015-10-01 NOTE — Progress Notes (Addendum)
Subjective:    Patient ID: [name], female    DOB: Oct 07, 1940, 75 y.o.   MRN: [id number]  HPI Pt here for follow up and management of chronic medical problems which includes hypothyroid and hyperlipidemia. She is taking medications regularly.The patient is doing well overall. She has no specific complaints.  She will get lab work done today and a flu shot today. She has a history of breast cancer. She is requesting retrying some BuSpar that she took back in 2008 when we will go ahead and agree with that. The patient denies any chest pain or shortness of breath. She sees Dr. Jannifer Franklin regularly for her trigeminal neuralgia and peripheral neuropathy and this is about every 6 months. She denies any trouble with nausea vomiting diarrhea or blood in the stool and she says that taking Activa you helps her heartburn. She also knows that she can take some Zantac in addition to this if she needs it. She denies any problems with passing her water burning pain or frequency and usually gets up a couple times at night and this is normal for her.    Patient Active Problem List   Diagnosis Date Noted  . 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 Polyneuropathy in other diseases classified elsewhere ([place]) 09/24/2013  . Memory disorder 09/24/2013  . Sleep apnea 05/15/2013  . Hypothyroidism, postsurgical 05/15/2013  . HTN (hypertension) 05/15/2013  . Vitamin D deficiency 05/15/2013  . Hyperlipidemia 05/15/2013  . Osteopenia 10/25/2012  . Mild cognitive impairment with memory loss 09/27/2012  . Trigeminal neuralgia 04/02/2011  . Dysphagia 07/23/2010  . GERD (gastroesophageal reflux disease) 07/23/2010  . Right facial pain 07/23/2010  . ANXIETY 05/24/2008  . COPD 05/24/2008  . CONSTIPATION 05/24/2008  . ARTHRITIS 05/24/2008  . Idiopathic osteoporosis 05/24/2008  . Malignant neoplasm of female breast ([facility]) 05/22/2008  . INTERNAL HEMORRHOIDS 05/22/2008  . COLONIC POLYPS,  ADENOMATOUS, HX OF 05/22/2008   Outpatient Encounter Prescriptions as of 10/01/2015  Medication Sig  . Apoaequorin (PREVAGEN) 10 MG CAPS Take 10 mg by mouth daily.   Marland Kitchen aspirin 81 MG tablet Take 81 mg by mouth daily.    . Calcium Carbonate-Vitamin D (CALCIUM 500/D) 500-125 MG-UNIT TABS Take 1 capsule by mouth daily.    . clobetasol (TEMOVATE) 0.05 % external solution Apply 1 application topically 2 (two) times a week.  . Cyanocobalamin (VITAMIN B 12 PO) Take by mouth.  . donepezil (ARICEPT) 10 MG tablet Take 10 mg by mouth at bedtime.   Marland Kitchen ezetimibe (ZETIA) 10 MG tablet TAKE (1) TABLET BY MOUTH DAILY. (Patient taking differently: Take 10 mg by mouth every evening. TAKE (1) TABLET BY MOUTH DAILY.)  . gabapentin (NEURONTIN) 300 MG capsule Take 300-600 mg by mouth 2 (two) times daily. Takes 1 tab in am and 2 tabs at night  . ibuprofen (ADVIL,MOTRIN) 200 MG tablet Take 400 mg by mouth every 6 (six) hours as needed (Pain).   Marland Kitchen levothyroxine (SYNTHROID, LEVOTHROID) 112 MCG tablet TAKE 1 TABLET BY MOUTH DAILY BEFORE BREAKFAST.  Marland Kitchen lisinopril-hydrochlorothiazide (PRINZIDE,ZESTORETIC) 10-12.5 MG tablet TAKE (1) TABLET BY MOUTH DAILY.  . Multiple Vitamin (MULTIVITAMIN) capsule Take 1 capsule by mouth daily.    . nortriptyline (PAMELOR) 25 MG capsule Take 75 mg by mouth at bedtime.   . Omega-3 Fatty Acids (FISH OIL) 1000 MG CAPS Take 1 capsule by mouth daily.    Marland Kitchen senna-docusate (SENOKOT S) 8.6-50 MG per tablet Take 1 tablet by mouth daily as needed  for constipation.  . [DISCONTINUED] doxycycline (VIBRA-TABS) 100 MG tablet Take 1 tablet (100 mg total) by mouth once.   No facility-administered encounter medications on file as of 10/01/2015.       Review of Systems  Constitutional: Negative.   HENT: Negative.   Eyes: Negative.   Respiratory: Negative.   Cardiovascular: Negative.   Gastrointestinal: Negative.   Endocrine: Negative.   Genitourinary: Negative.   Musculoskeletal: Negative.   Skin:  Negative.   Allergic/Immunologic: Negative.   Neurological: Negative.   Hematological: Negative.   Psychiatric/Behavioral: Negative.        Objective:   Physical Exam  Constitutional: She is oriented to person, place, and time. She appears well-developed and well-nourished.  HENT:  Head: Normocephalic and atraumatic.  Right Ear: External ear normal.  Left Ear: External ear normal.  Nose: Nose normal.  Mouth/Throat: Oropharynx is clear and moist.  Eyes: Conjunctivae and EOM are normal. Pupils are equal, round, and reactive to light. Right eye exhibits no discharge. Left eye exhibits no discharge. No scleral icterus.  Neck: Normal range of motion. Neck supple. No thyromegaly present.  No bruits or thyromegaly or anterior cervical adenopathy  Cardiovascular: Normal rate, regular rhythm and intact distal pulses.   No murmur heard. The heart has a regular rate and rhythm at 72/m.  Pulmonary/Chest: Effort normal and breath sounds normal. No respiratory distress. She has no wheezes. She has no rales.  Abdominal: Soft. Bowel sounds are normal. She exhibits no mass. There is tenderness. There is no rebound and no guarding.  No abdominal  masses or organ enlargement, there is some slight epigastric tenderness. There is no inguinal adenopathy and she has good inguinal pulses.  Musculoskeletal: Normal range of motion. She exhibits no edema.  Lymphadenopathy:    She has no cervical adenopathy.  Neurological: She is alert and oriented to person, place, and time. She has normal reflexes. No cranial nerve deficit.  Skin: Skin is warm and dry. No rash noted.  Psychiatric: She has a normal mood and affect. Her behavior is normal. Judgment and thought content normal.  Patient's affect seems somewhat flat today compared to usual. It may be because she had to wait longer to see me today.  Nursing note and vitals reviewed.  BP 104/64 (BP Location: Right Arm)   Pulse 79   Temp 97.3 F (36.3 C) (Oral)    Ht '5\' 9"'  (1.753 m)   Wt 168 lb (76.2 kg)   BMI 24.81 kg/m         Assessment & Plan:  1. Hyperlipidemia -Continue current treatment pending results of lab work - CBC with Differential/Platelet - NMR, lipoprofile  2. Essential hypertension -The blood pressure is good today and she will continue with her current treatment - BMP8+EGFR - CBC with Differential/Platelet - Hepatic function panel  3. Hypothyroidism, postsurgical -Continue with current treatment pending results of lab work - CBC with Differential/Platelet - Thyroid Panel With TSH  4. Vitamin D deficiency -Continue with current treatment pending results of lab work - CBC with Differential/Platelet - VITAMIN D 25 Hydroxy (Vit-D Deficiency, Fractures)  5. Gastroesophageal reflux disease, esophagitis presence not specified -Continue with activity via and use ranitidine as needed - CBC with Differential/Platelet - Hepatic function panel  6. B12 deficiency - CBC with Differential/Platelet - Vitamin B12  Meds ordered this encounter  Medications  . Cyanocobalamin (VITAMIN B 12 PO)    Sig: Take by mouth.   We will also refill BuSpar 30 mg one half  to one twice daily as needed  Patient Instructions                       Medicare Annual Wellness Visit  [facility] and the medical providers at [facility] strive to bring you the best medical care.  In doing so we not only want to address your current medical conditions and concerns but also to detect new conditions early and prevent illness, disease and health-related problems.    Medicare offers a yearly Wellness Visit which allows our clinical staff to assess your need for preventative services including immunizations, lifestyle education, counseling to decrease risk of preventable diseases and screening for fall risk and other medical concerns.    This visit is provided free of charge (no copay) for all Medicare recipients. The clinical  pharmacists at [facility] have begun to conduct these Wellness Visits which will also include a thorough review of all your medications.    As you primary medical provider recommend that you make an appointment for your Annual Wellness Visit if you have not done so already this year.  You may set up this appointment before you leave today or you may call back ([phone]) and schedule an appointment.  Please make sure when you call that you mention that you are scheduling your Annual Wellness Visit with the clinical pharmacist so that the appointment may be made for the proper length of time.     Continue current medications. Continue good therapeutic lifestyle changes which include good diet and exercise. Fall precautions discussed with patient. If an FOBT was given today- please return it to our front desk. If you are over 48 years old - you may need Prevnar 58 or the adult Pneumonia vaccine.  **Flu shots are available--- please call and schedule a FLU-CLINIC appointment**  After your visit with [place] today you will receive a survey in the mail or online from [employer] regarding your care with [place]. Please take a moment to fill this out. Your feedback is very important to [place] as you can help [place] better understand your patient needs as well as improve your experience and satisfaction. WE CARE ABOUT YOU!!!   The flu shot that he received today may make your arm sore Continue to follow-up with neurology for trigeminal neuralgia and neuropathy Do not forget to get your mammogram in October We will call with lab work results as soon as they become available    Arrie Senate MD   .

## 2015-10-02 ENCOUNTER — Telehealth: Payer: Self-pay | Admitting: Family Medicine

## 2015-10-02 LAB — HEPATIC FUNCTION PANEL
ALBUMIN: 4.3 g/dL (ref 3.5–4.8)
ALT: 23 IU/L (ref 0–32)
AST: 34 IU/L (ref 0–40)
Alkaline Phosphatase: 63 IU/L (ref 39–117)
Bilirubin Total: 0.6 mg/dL (ref 0.0–1.2)
Bilirubin, Direct: 0.14 mg/dL (ref 0.00–0.40)
Total Protein: 6.5 g/dL (ref 6.0–8.5)

## 2015-10-02 LAB — BMP8+EGFR
BUN/Creatinine Ratio: 22 (ref 12–28)
BUN: 12 mg/dL (ref 8–27)
CO2: 27 mmol/L (ref 18–29)
Calcium: 9.5 mg/dL (ref 8.7–10.3)
Chloride: 99 mmol/L (ref 96–106)
Creatinine, Ser: 0.55 mg/dL — ABNORMAL LOW (ref 0.57–1.00)
GFR calc Af Amer: 107 mL/min/{1.73_m2} (ref >59)
GFR calc non Af Amer: 93 mL/min/{1.73_m2} (ref >59)
GLUCOSE: 89 mg/dL (ref 65–99)
POTASSIUM: 4.6 mmol/L (ref 3.5–5.2)
SODIUM: 141 mmol/L (ref 134–144)

## 2015-10-02 LAB — CBC WITH DIFFERENTIAL/PLATELET
BASOS: 1 %
Basophils Absolute: 0 10*3/uL (ref 0.0–0.2)
EOS (ABSOLUTE): 0.2 10*3/uL (ref 0.0–0.4)
Eos: 4 %
Hematocrit: 39 % (ref 34.0–46.6)
Hemoglobin: 12.9 g/dL (ref 11.1–15.9)
IMMATURE GRANULOCYTES: 0 %
Immature Grans (Abs): 0 10*3/uL (ref 0.0–0.1)
LYMPHS ABS: 1.6 10*3/uL (ref 0.7–3.1)
Lymphs: 29 %
MCH: 29.9 pg (ref 26.6–33.0)
MCHC: 33.1 g/dL (ref 31.5–35.7)
MCV: 90 fL (ref 79–97)
MONOS ABS: 0.4 10*3/uL (ref 0.1–0.9)
Monocytes: 8 %
NEUTROS PCT: 58 %
Neutrophils Absolute: 3.4 10*3/uL (ref 1.4–7.0)
PLATELETS: 255 10*3/uL (ref 150–379)
RBC: 4.32 x10E6/uL (ref 3.77–5.28)
RDW: 14.7 % (ref 12.3–15.4)
WBC: 5.7 10*3/uL (ref 3.4–10.8)

## 2015-10-02 LAB — VITAMIN B12: VITAMIN B 12: 841 pg/mL (ref 211–946)

## 2015-10-02 LAB — NMR, LIPOPROFILE
Cholesterol: 268 mg/dL — ABNORMAL HIGH (ref 100–199)
HDL CHOLESTEROL BY NMR: 61 mg/dL (ref >39)
HDL PARTICLE NUMBER: 38.8 umol/L (ref >=30.5)
LDL Particle Number: 2592 nmol/L — ABNORMAL HIGH (ref <1000)
LDL Size: 20.9 nm (ref >20.5)
LDL-C: 189 mg/dL — AB (ref 0–99)
LP-IR Score: 49 — ABNORMAL HIGH (ref <=45)
Small LDL Particle Number: 1352 nmol/L — ABNORMAL HIGH (ref <=527)
TRIGLYCERIDES BY NMR: 92 mg/dL (ref 0–149)

## 2015-10-02 LAB — VITAMIN D 25 HYDROXY (VIT D DEFICIENCY, FRACTURES): VIT D 25 HYDROXY: 55.2 ng/mL (ref 30.0–100.0)

## 2015-10-02 LAB — THYROID PANEL WITH TSH
Free Thyroxine Index: 2.6 (ref 1.2–4.9)
T3 UPTAKE RATIO: 28 % (ref 24–39)
T4, Total: 9.4 ug/dL (ref 4.5–12.0)
TSH: 3.32 u[IU]/mL (ref 0.450–4.500)

## 2015-10-21 DIAGNOSIS — C50912 Malignant neoplasm of unspecified site of left female breast: Secondary | ICD-10-CM | POA: Diagnosis not present

## 2015-10-22 ENCOUNTER — Telehealth: Payer: Self-pay | Admitting: Family Medicine

## 2015-10-22 NOTE — Telephone Encounter (Signed)
Denied.

## 2015-11-04 ENCOUNTER — Ambulatory Visit (INDEPENDENT_AMBULATORY_CARE_PROVIDER_SITE_OTHER): Payer: Medicare Other | Admitting: Family Medicine

## 2015-11-04 ENCOUNTER — Encounter: Payer: Self-pay | Admitting: Family Medicine

## 2015-11-04 VITALS — BP 131/74 | HR 88 | Temp 98.3°F | Ht 69.0 in | Wt 171.0 lb

## 2015-11-04 DIAGNOSIS — T23121A Burn of first degree of single right finger (nail) except thumb, initial encounter: Secondary | ICD-10-CM | POA: Diagnosis not present

## 2015-11-04 DIAGNOSIS — T300 Burn of unspecified body region, unspecified degree: Secondary | ICD-10-CM
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
## 2015-11-20 NOTE — Progress Notes (Signed)
I have already addended this note

## 2015-12-01 ENCOUNTER — Emergency Department (HOSPITAL_COMMUNITY): Payer: Medicare Other

## 2015-12-01 ENCOUNTER — Encounter (HOSPITAL_COMMUNITY): Payer: Self-pay | Admitting: Emergency Medicine

## 2015-12-01 ENCOUNTER — Emergency Department (HOSPITAL_COMMUNITY)
Admission: EM | Admit: 2015-12-01 | Discharge: 2015-12-01 | Disposition: A | Discharge status: Home / Self Care | Payer: Medicare Other | Attending: Emergency Medicine | Admitting: Emergency Medicine

## 2015-12-01 ENCOUNTER — Ambulatory Visit: Payer: Medicare Other | Admitting: Pediatrics

## 2015-12-01 DIAGNOSIS — S32048A Other fracture of fourth lumbar vertebra, initial encounter for closed fracture: Secondary | ICD-10-CM | POA: Diagnosis not present

## 2015-12-01 DIAGNOSIS — Y999 Unspecified external cause status: Secondary | ICD-10-CM | POA: Insufficient documentation

## 2015-12-01 DIAGNOSIS — S32028A Other fracture of second lumbar vertebra, initial encounter for closed fracture: Secondary | ICD-10-CM | POA: Insufficient documentation

## 2015-12-01 DIAGNOSIS — Z79899 Other long term (current) drug therapy: Secondary | ICD-10-CM | POA: Insufficient documentation

## 2015-12-01 DIAGNOSIS — M7989 Other specified soft tissue disorders: Secondary | ICD-10-CM | POA: Diagnosis not present

## 2015-12-01 DIAGNOSIS — K839 Disease of biliary tract, unspecified: Secondary | ICD-10-CM | POA: Insufficient documentation

## 2015-12-01 DIAGNOSIS — S80211A Abrasion, right knee, initial encounter: Secondary | ICD-10-CM | POA: Diagnosis not present

## 2015-12-01 DIAGNOSIS — Y92410 Unspecified street and highway as the place of occurrence of the external cause: Secondary | ICD-10-CM | POA: Diagnosis not present

## 2015-12-01 DIAGNOSIS — S3991XA Unspecified injury of abdomen, initial encounter: Secondary | ICD-10-CM | POA: Diagnosis not present

## 2015-12-01 DIAGNOSIS — S0003XA Contusion of scalp, initial encounter: Secondary | ICD-10-CM | POA: Diagnosis not present

## 2015-12-01 DIAGNOSIS — R0789 Other chest pain: Secondary | ICD-10-CM | POA: Diagnosis not present

## 2015-12-01 DIAGNOSIS — Z7982 Long term (current) use of aspirin: Secondary | ICD-10-CM | POA: Insufficient documentation

## 2015-12-01 DIAGNOSIS — I10 Essential (primary) hypertension: Secondary | ICD-10-CM | POA: Diagnosis not present

## 2015-12-01 DIAGNOSIS — S0990XA Unspecified injury of head, initial encounter: Secondary | ICD-10-CM | POA: Diagnosis not present

## 2015-12-01 DIAGNOSIS — R079 Chest pain, unspecified: Secondary | ICD-10-CM | POA: Diagnosis not present

## 2015-12-01 DIAGNOSIS — Y939 Activity, unspecified: Secondary | ICD-10-CM | POA: Insufficient documentation

## 2015-12-01 DIAGNOSIS — E039 Hypothyroidism, unspecified: Secondary | ICD-10-CM | POA: Diagnosis not present

## 2015-12-01 DIAGNOSIS — S32009A Unspecified fracture of unspecified lumbar vertebra, initial encounter for closed fracture: Secondary | ICD-10-CM

## 2015-12-01 DIAGNOSIS — S32038A Other fracture of third lumbar vertebra, initial encounter for closed fracture: Secondary | ICD-10-CM | POA: Diagnosis not present

## 2015-12-01 DIAGNOSIS — S199XXA Unspecified injury of neck, initial encounter: Secondary | ICD-10-CM | POA: Diagnosis not present

## 2015-12-01 DIAGNOSIS — S299XXA Unspecified injury of thorax, initial encounter: Secondary | ICD-10-CM | POA: Diagnosis not present

## 2015-12-01 LAB — BASIC METABOLIC PANEL
Anion gap: 8 (ref 5–15)
BUN: 12 mg/dL (ref 6–20)
CHLORIDE: 102 mmol/L (ref 101–111)
CO2: 28 mmol/L (ref 22–32)
CREATININE: 0.5 mg/dL (ref 0.44–1.00)
Calcium: 8.9 mg/dL (ref 8.9–10.3)
GFR calc Af Amer: >60 mL/min (ref >60)
GFR calc non Af Amer: >60 mL/min (ref >60)
GLUCOSE: 94 mg/dL (ref 65–99)
POTASSIUM: 3.4 mmol/L — AB (ref 3.5–5.1)
SODIUM: 138 mmol/L (ref 135–145)

## 2015-12-01 LAB — CBC WITH DIFFERENTIAL/PLATELET
Basophils Absolute: 0 10*3/uL (ref 0.0–0.1)
Basophils Relative: 0 %
EOS ABS: 0.3 10*3/uL (ref 0.0–0.7)
EOS PCT: 3 %
HCT: 39.3 % (ref 36.0–46.0)
HEMOGLOBIN: 12.9 g/dL (ref 12.0–15.0)
LYMPHS ABS: 1.8 10*3/uL (ref 0.7–4.0)
LYMPHS PCT: 23 %
MCH: 30.6 pg (ref 26.0–34.0)
MCHC: 32.8 g/dL (ref 30.0–36.0)
MCV: 93.1 fL (ref 78.0–100.0)
MONOS PCT: 7 %
Monocytes Absolute: 0.5 10*3/uL (ref 0.1–1.0)
Neutro Abs: 5.3 10*3/uL (ref 1.7–7.7)
Neutrophils Relative %: 67 %
PLATELETS: 242 10*3/uL (ref 150–400)
RBC: 4.22 MIL/uL (ref 3.87–5.11)
RDW: 13.6 % (ref 11.5–15.5)
WBC: 8 10*3/uL (ref 4.0–10.5)

## 2015-12-01 MED ORDER — ONDANSETRON HCL 4 MG/2ML IJ SOLN
4.0000 mg | Freq: Once | INTRAMUSCULAR | Status: AC
  Administered 2015-12-01: 4 mg via INTRAVENOUS
  Filled 2015-12-01: qty 2

## 2015-12-01 MED ORDER — HYDROCODONE-ACETAMINOPHEN 5-325 MG PO TABS: 1.0000 | ORAL_TABLET | Freq: Four times a day (QID) | ORAL | 0 refills | Status: DC | PRN

## 2015-12-01 MED ORDER — HYDROCODONE-ACETAMINOPHEN 5-325 MG PO TABS
1.0000 | ORAL_TABLET | Freq: Once | ORAL | Status: AC
  Administered 2015-12-01: 1 via ORAL
  Filled 2015-12-01: qty 1

## 2015-12-01 MED ORDER — IOPAMIDOL (ISOVUE-300) INJECTION 61%
100.0000 mL | Freq: Once | INTRAVENOUS | Status: AC | PRN
  Administered 2015-12-01: 100 mL via INTRAVENOUS

## 2015-12-01 MED ORDER — SODIUM CHLORIDE 0.9 % IV SOLN
INTRAVENOUS | Status: DC
  Administered 2015-12-01: 17:00:00 via INTRAVENOUS
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
## 2015-12-01 NOTE — ED Notes (Signed)
Pt requesting something for pain, Dr. Wallie Char informed and orders given for pain med
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
## 2015-12-08 ENCOUNTER — Ambulatory Visit (INDEPENDENT_AMBULATORY_CARE_PROVIDER_SITE_OTHER): Payer: Medicare Other

## 2015-12-08 ENCOUNTER — Ambulatory Visit (INDEPENDENT_AMBULATORY_CARE_PROVIDER_SITE_OTHER): Payer: Medicare Other | Admitting: Family Medicine

## 2015-12-08 ENCOUNTER — Encounter: Payer: Self-pay | Admitting: Family Medicine

## 2015-12-08 VITALS — BP 130/83 | HR 71 | Temp 96.9°F | Ht 69.0 in | Wt 170.1 lb

## 2015-12-08 DIAGNOSIS — R0782 Intercostal pain: Secondary | ICD-10-CM
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
## 2015-12-23 ENCOUNTER — Encounter: Payer: Self-pay | Admitting: Family Medicine

## 2015-12-23 ENCOUNTER — Ambulatory Visit (INDEPENDENT_AMBULATORY_CARE_PROVIDER_SITE_OTHER): Payer: Medicare Other | Admitting: Family Medicine

## 2015-12-23 VITALS — BP 134/77 | HR 84 | Temp 97.4°F | Ht 69.0 in | Wt 170.0 lb

## 2015-12-23 DIAGNOSIS — B029 Zoster without complications: Secondary | ICD-10-CM

## 2015-12-23 MED ORDER — VALACYCLOVIR HCL 1 G PO TABS: 1000.0000 mg | ORAL_TABLET | Freq: Three times a day (TID) | ORAL | 0 refills | Status: DC

## 2015-12-23 NOTE — Patient Instructions (Signed)
Take Valtrex 1 g 3 times daily for 10 days Patient indicates that she has medication at home to take for pain and she will use this if needed He will come back in a couple weeks for recheck.
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
## 2015-12-30 NOTE — Telephone Encounter (Signed)
This encounter can be closed °

## 2016-01-06 ENCOUNTER — Ambulatory Visit: Payer: Medicare Other | Admitting: Family Medicine

## 2016-01-06 ENCOUNTER — Other Ambulatory Visit: Payer: Self-pay | Admitting: Family Medicine

## 2016-01-06 DIAGNOSIS — Z1231 Encounter for screening mammogram for malignant neoplasm of breast: Secondary | ICD-10-CM

## 2016-01-07 ENCOUNTER — Telehealth: Payer: Self-pay | Admitting: Family Medicine

## 2016-01-07 ENCOUNTER — Encounter: Payer: Self-pay | Admitting: Family Medicine

## 2016-01-07 NOTE — Telephone Encounter (Signed)
NA 1/3-jhb

## 2016-01-19 ENCOUNTER — Ambulatory Visit (HOSPITAL_COMMUNITY)
Admission: RE | Admit: 2016-01-19 | Discharge: 2016-01-19 | Disposition: A | Discharge status: Home / Self Care | Payer: Medicare Other | Source: Ambulatory Visit | Attending: Family Medicine | Admitting: Family Medicine

## 2016-01-19 DIAGNOSIS — Z9012 Acquired absence of left breast and nipple: Secondary | ICD-10-CM | POA: Diagnosis not present

## 2016-01-19 DIAGNOSIS — Z1231 Encounter for screening mammogram for malignant neoplasm of breast: Secondary | ICD-10-CM | POA: Diagnosis not present

## 2016-02-11 ENCOUNTER — Ambulatory Visit (INDEPENDENT_AMBULATORY_CARE_PROVIDER_SITE_OTHER): Payer: Medicare Other

## 2016-02-11 ENCOUNTER — Encounter: Payer: Self-pay | Admitting: Family Medicine

## 2016-02-11 ENCOUNTER — Ambulatory Visit (INDEPENDENT_AMBULATORY_CARE_PROVIDER_SITE_OTHER): Payer: Medicare Other | Admitting: Family Medicine

## 2016-02-11 VITALS — BP 118/71 | HR 73 | Temp 96.6°F | Ht 69.0 in | Wt 177.0 lb

## 2016-02-11 DIAGNOSIS — I10 Essential (primary) hypertension: Secondary | ICD-10-CM

## 2016-02-11 DIAGNOSIS — R0781 Pleurodynia: Secondary | ICD-10-CM | POA: Diagnosis not present

## 2016-02-11 DIAGNOSIS — E7800 Pure hypercholesterolemia, unspecified: Secondary | ICD-10-CM

## 2016-02-11 DIAGNOSIS — K219 Gastro-esophageal reflux disease without esophagitis: Secondary | ICD-10-CM | POA: Diagnosis not present

## 2016-02-11 DIAGNOSIS — G63 Polyneuropathy in diseases classified elsewhere: Secondary | ICD-10-CM

## 2016-02-11 DIAGNOSIS — E890 Postprocedural hypothyroidism: Secondary | ICD-10-CM | POA: Diagnosis not present

## 2016-02-11 DIAGNOSIS — Z23 Encounter for immunization: Secondary | ICD-10-CM

## 2016-02-11 DIAGNOSIS — W01198A Fall on same level from slipping, tripping and stumbling with subsequent striking against other object, initial encounter: Secondary | ICD-10-CM

## 2016-02-11 DIAGNOSIS — E559 Vitamin D deficiency, unspecified: Secondary | ICD-10-CM

## 2016-02-11 DIAGNOSIS — E538 Deficiency of other specified B group vitamins: Secondary | ICD-10-CM

## 2016-02-11 DIAGNOSIS — C50412 Malignant neoplasm of upper-outer quadrant of left female breast: Secondary | ICD-10-CM

## 2016-02-11 DIAGNOSIS — W19XXXA Unspecified fall, initial encounter: Secondary | ICD-10-CM

## 2016-02-11 MED ORDER — LEVOTHYROXINE SODIUM 112 MCG PO TABS: ORAL_TABLET | ORAL | 11 refills | Status: DC

## 2016-02-11 NOTE — Addendum Note (Signed)
Addended by: Zannie Cove on: 02/11/2016 11:44 AM   Modules accepted: Orders

## 2016-02-11 NOTE — Patient Instructions (Addendum)
Medicare Annual Wellness Visit  [facility] and the medical providers at [facility] strive to bring you the best medical care.  In doing so we not only want to address your current medical conditions and concerns but also to detect new conditions early and prevent illness, disease and health-related problems.    Medicare offers a yearly Wellness Visit which allows our clinical staff to assess your need for preventative services including immunizations, lifestyle education, counseling to decrease risk of preventable diseases and screening for fall risk and other medical concerns.    This visit is provided free of charge (no copay) for all Medicare recipients. The clinical pharmacists at [facility] have begun to conduct these Wellness Visits which will also include a thorough review of all your medications.    As you primary medical provider recommend that you make an appointment for your Annual Wellness Visit if you have not done so already this year.  You may set up this appointment before you leave today or you may call back [phone]) and schedule an appointment.  Please make sure when you call that you mention that you are scheduling your Annual Wellness Visit with the clinical pharmacist so that the appointment may be made for the proper length of time.     Continue current medications. Continue good therapeutic lifestyle changes which include good diet and exercise. Fall precautions discussed with patient. If an FOBT was given today- please return it to our front desk. If you are over 34 years old - you may need Prevnar 52 or the adult Pneumonia vaccine.  **Flu shots are available--- please call and schedule a FLU-CLINIC appointment**  After your visit with [place] today you will receive a survey in the mail or online from [employer] regarding your care with [place]. Please take a moment to fill this out. Your feedback is very  important to [place] as you can help [place] better understand your patient needs as well as improve your experience and satisfaction. WE CARE ABOUT YOU!!!   Check with your pharmacists and see if they have a female rib belt. This may help to control some of your pain. Stop the ibuprofen or Advil and only take extra strength Tylenol if needed for pain Use ice for the first 48 hours after the injury and then warm wet compresses 20 minutes 4 times daily No heating pads Follow-up with neurology as planned

## 2016-02-11 NOTE — Progress Notes (Signed)
Subjective:    Patient ID: [name], female    DOB: 1940/11/09, 76 y.o.   MRN: [id number]  HPI Pt here for follow up and management of chronic medical problems which includes hyperlipidemia, hypertension and hypothyroid. She is taking medication regularly.Patient is doing well overall other than a fall injuring her right side of her chest yesterday. She'll get lab work today. She is having some rib pain in the area of the fall. She is requesting a refill on her thyroid medication. The patient does have some pretty severe right lateral chest wall pain. She described what happened was that she was stepping over a glioma going to the mailbox and slipped on some wet leaves and fell against part of the limb on the right and hit her chest wall. She denies any chest pain or shortness of breath other than this injury. She has no nausea vomiting diarrhea or blood in the stool but does have pretty severe heartburn and indigestion. She just started taking Nexium a couple days ago. She is still taking ibuprofen and I encouraged her to stop that and only take extra strength Tylenol for pain. She is passing her water without problems. Her breast cancer was in the left breast in the left upper quadrant.    Patient Active Problem List   Diagnosis Date Noted  . 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 Polyneuropathy in other diseases classified elsewhere ([place]) 09/24/2013  . Memory disorder 09/24/2013  . Sleep apnea 05/15/2013  . Hypothyroidism, postsurgical 05/15/2013  . HTN (hypertension) 05/15/2013  . Vitamin D deficiency 05/15/2013  . Hyperlipidemia 05/15/2013  . Osteopenia 10/25/2012  . Mild cognitive impairment with memory loss 09/27/2012  . Trigeminal neuralgia 04/02/2011  . Dysphagia 07/23/2010  . GERD (gastroesophageal reflux disease) 07/23/2010  . Right facial pain 07/23/2010  . ANXIETY 05/24/2008  . COPD 05/24/2008  . CONSTIPATION 05/24/2008  . ARTHRITIS  05/24/2008  . Idiopathic osteoporosis 05/24/2008  . Malignant neoplasm of female breast ([place]) 05/22/2008  . INTERNAL HEMORRHOIDS 05/22/2008  . COLONIC POLYPS, ADENOMATOUS, HX OF 05/22/2008   Outpatient Encounter Prescriptions as of 02/11/2016  Medication Sig  . Apoaequorin (PREVAGEN) 10 MG CAPS Take 10 mg by mouth every evening.   Marland Kitchen aspirin 81 MG tablet Take 81 mg by mouth 3 (three) times a week.   . busPIRone (BUSPAR) 30 MG tablet 1/2 to 1 whole tab daily PRN (Patient taking differently: Take 15-30 mg by mouth daily as needed. )  . Calcium Carbonate-Vitamin D (CALCIUM 500/D) 500-125 MG-UNIT TABS Take 1 capsule by mouth daily.    . clobetasol (TEMOVATE) 0.05 % external solution Apply 1 application topically 2 (two) times a week.  . Cyanocobalamin (VITAMIN B 12 PO) Take 1 tablet by mouth daily.   Marland Kitchen donepezil (ARICEPT) 10 MG tablet Take 10 mg by mouth at bedtime.   Marland Kitchen ezetimibe (ZETIA) 10 MG tablet TAKE (1) TABLET BY MOUTH DAILY. (Patient taking differently: Take 10 mg by mouth every evening. TAKE (1) TABLET BY MOUTH DAILY.)  . gabapentin (NEURONTIN) 300 MG capsule Take 300-600 mg by mouth 2 (two) times daily. Takes 1 tab in am and 2 tabs at night  . HYDROcodone-acetaminophen (NORCO/VICODIN) 5-325 MG tablet Take 1-2 tablets by mouth every 6 (six) hours as needed.  Marland Kitchen ibuprofen (ADVIL,MOTRIN) 200 MG tablet Take 800 mg by mouth every 6 (six) hours as needed (Pain).   Marland Kitchen levothyroxine (SYNTHROID, LEVOTHROID) 112 MCG tablet TAKE 1 TABLET BY MOUTH DAILY BEFORE  BREAKFAST.  Marland Kitchen lisinopril-hydrochlorothiazide (PRINZIDE,ZESTORETIC) 10-12.5 MG tablet TAKE (1) TABLET BY MOUTH DAILY.  . Multiple Vitamin (MULTIVITAMIN) capsule Take 1 capsule by mouth daily.    . nortriptyline (PAMELOR) 25 MG capsule Take 75 mg by mouth at bedtime.   . Omega-3 Fatty Acids (FISH OIL) 1000 MG CAPS Take 1 capsule by mouth daily.    Marland Kitchen senna-docusate (SENOKOT S) 8.6-50 MG per tablet Take 1 tablet by mouth daily as needed for  constipation.  . valACYclovir (VALTREX) 1000 MG tablet Take 1 tablet (1,000 mg total) by mouth 3 (three) times daily.  . [DISCONTINUED] HYDROcodone-acetaminophen (NORCO/VICODIN) 5-325 MG tablet Take 1-2 tablets by mouth every 6 (six) hours as needed. (Patient not taking: Reported on 12/23/2015)   No facility-administered encounter medications on file as of 02/11/2016.       Review of Systems  Constitutional: Negative.   HENT: Negative.   Eyes: Negative.   Respiratory: Negative.   Cardiovascular: Negative.   Gastrointestinal: Negative.   Endocrine: Negative.   Genitourinary: Negative.   Musculoskeletal: Positive for arthralgias (fall yesterday - right rib pain).  Skin: Negative.   Allergic/Immunologic: Negative.   Neurological: Negative.   Hematological: Negative.   Psychiatric/Behavioral: Negative.        Objective:   Physical Exam  Constitutional: She is oriented to person, place, and time. She appears well-developed and well-nourished. She appears distressed.  The patient is having pretty severe right rib cage pain. This is secondary to the fall from yesterday.  HENT:  Head: Normocephalic and atraumatic.  Right Ear: External ear normal.  Left Ear: External ear normal.  Nose: Nose normal.  Mouth/Throat: Oropharynx is clear and moist. No oropharyngeal exudate.  Eyes: Conjunctivae and EOM are normal. Pupils are equal, round, and reactive to light. Right eye exhibits no discharge. Left eye exhibits no discharge. No scleral icterus.  Neck: Normal range of motion. Neck supple. No thyromegaly present.  No bruits thyromegaly or anterior cervical adenopathy  Cardiovascular: Normal rate, regular rhythm, normal heart sounds and intact distal pulses.   No murmur heard. Pulmonary/Chest: Effort normal and breath sounds normal. No respiratory distress. She has no wheezes. She has no rales. She exhibits tenderness.  The chest is clear. She has a bruise over the right posterior lateral lower  rib cage just below the bra off line. It is tender to palpation. She is tender on the right anterior chest wall. The lungs were clear anteriorly and posteriorly with no pops or cracks or rales.  Abdominal: Soft. Bowel sounds are normal. She exhibits no mass. There is tenderness. There is no rebound and no guarding.  Her was some right upper quadrant tenderness which relayed back to the rib cage injury.  Musculoskeletal: Normal range of motion. She exhibits tenderness. She exhibits no edema.  Tenderness over the right lower lateral posterior rib cage area where the bruises located.  Lymphadenopathy:    She has no cervical adenopathy.  Neurological: She is alert and oriented to person, place, and time. She has normal reflexes. No cranial nerve deficit.  Skin: Skin is warm and dry. No rash noted.  Psychiatric: She has a normal mood and affect. Her behavior is normal. Judgment and thought content normal.  Nursing note and vitals reviewed.  BP 118/71 (BP Location: Left Arm)   Pulse 73   Temp (!) 96.6 F (35.9 C) (Oral)   Ht _0  (1.753 m)   Wt 177 lb (80.3 kg)   BMI 26.14 kg/m   Rib detail with  results pending    Assessment & Plan:  1. Pure hypercholesterolemia -Continue with current treatment pending results of lab work - CBC with Differential/Platelet - Lipid panel  2. Essential hypertension -The blood pressure is good today and she will continue with current treatment - CBC with Differential/Platelet - BMP8+EGFR - Hepatic function panel  3. Vitamin D deficiency -Continue with vitamin D replacement pending results of lab work - CBC with Differential/Platelet - VITAMIN D 25 Hydroxy (Vit-D Deficiency, Fractures)  4. Gastroesophageal reflux disease, esophagitis presence not specified -Patient has been having increased heartburn and indigestion. She just started taking Nexium a couple of days ago. She is still taking ibuprofen or Advil and she was told to stop taking this and only  take extra strength Tylenol. She understands to do this. - CBC with Differential/Platelet  5. Hypothyroidism, postsurgical -Continue thyroid replacement pending results of lab work - CBC with Differential/Platelet - Thyroid Panel With TSH  6. B12 deficiency - CBC with Differential/Platelet  7. Polyneuropathy in other diseases classified elsewhere [facility]) -Follow-up with neurology as planned  8. Fall, initial encounter -X-ray of area of contusion -Use rib belt and only take extra strength Tylenol for pain -Apply ice for the first 48 hours and then use warm wet compresses - DG Ribs Unilateral W/Chest Right; Future  9. Rib pain on right side -Ice followed by warm wet compresses - DG Ribs Unilateral W/Chest Right; Future  10. Malignant neoplasm of upper-outer quadrant of left female breast, unspecified estrogen receptor status ([place]) -Follow-up with surgery as planned and get mammogram as planned  Meds ordered this encounter  Medications  . levothyroxine (SYNTHROID, LEVOTHROID) 112 MCG tablet    Sig: TAKE 1 TABLET BY MOUTH DAILY BEFORE BREAKFAST.    Dispense:  30 tablet    Refill:  11   Patient Instructions                       Medicare Annual Wellness Visit  [place] and the medical providers at [facility] strive to bring you the best medical care.  In doing so we not only want to address your current medical conditions and concerns but also to detect new conditions early and prevent illness, disease and health-related problems.    Medicare offers a yearly Wellness Visit which allows our clinical staff to assess your need for preventative services including immunizations, lifestyle education, counseling to decrease risk of preventable diseases and screening for fall risk and other medical concerns.    This visit is provided free of charge (no copay) for all Medicare recipients. The clinical pharmacists at [facility] have begun to  conduct these Wellness Visits which will also include a thorough review of all your medications.    As you primary medical provider recommend that you make an appointment for your Annual Wellness Visit if you have not done so already this year.  You may set up this appointment before you leave today or you may call back ([phone]) and schedule an appointment.  Please make sure when you call that you mention that you are scheduling your Annual Wellness Visit with the clinical pharmacist so that the appointment may be made for the proper length of time.     Continue current medications. Continue good therapeutic lifestyle changes which include good diet and exercise. Fall precautions discussed with patient. If an FOBT was given today- please return it to our front desk. If you are over 50 years  old - you may need Prevnar 13 or the adult Pneumonia vaccine.  **Flu shots are available--- please call and schedule a FLU-CLINIC appointment**  After your visit with [place] today you will receive a survey in the mail or online from [employer] regarding your care with [place]. Please take a moment to fill this out. Your feedback is very important to [place] as you can help [place] better understand your patient needs as well as improve your experience and satisfaction. WE CARE ABOUT YOU!!!   Check with your pharmacists and see if they have a female rib belt. This may help to control some of your pain. Stop the ibuprofen or Advil and only take extra strength Tylenol if needed for pain Use ice for the first 48 hours after the injury and then warm wet compresses 20 minutes 4 times daily No heating pads Follow-up with neurology as planned   Arrie Senate MD

## 2016-02-12 ENCOUNTER — Ambulatory Visit: Payer: Medicare Other | Admitting: Adult Health

## 2016-02-13 ENCOUNTER — Emergency Department (HOSPITAL_COMMUNITY): Payer: Medicare Other

## 2016-02-13 ENCOUNTER — Encounter (HOSPITAL_COMMUNITY): Payer: Self-pay | Admitting: Emergency Medicine

## 2016-02-13 ENCOUNTER — Emergency Department (HOSPITAL_COMMUNITY)
Admission: EM | Admit: 2016-02-13 | Discharge: 2016-02-13 | Disposition: A | Discharge status: Home / Self Care | Payer: Medicare Other | Attending: Dermatology | Admitting: Dermatology

## 2016-02-13 DIAGNOSIS — I10 Essential (primary) hypertension: Secondary | ICD-10-CM | POA: Insufficient documentation

## 2016-02-13 DIAGNOSIS — J449 Chronic obstructive pulmonary disease, unspecified: Secondary | ICD-10-CM | POA: Insufficient documentation

## 2016-02-13 DIAGNOSIS — Z7982 Long term (current) use of aspirin: Secondary | ICD-10-CM | POA: Insufficient documentation

## 2016-02-13 DIAGNOSIS — Z79899 Other long term (current) drug therapy: Secondary | ICD-10-CM | POA: Insufficient documentation

## 2016-02-13 DIAGNOSIS — R079 Chest pain, unspecified: Secondary | ICD-10-CM | POA: Diagnosis not present

## 2016-02-13 DIAGNOSIS — Z5321 Procedure and treatment not carried out due to patient leaving prior to being seen by health care provider: Secondary | ICD-10-CM | POA: Diagnosis not present

## 2016-02-13 DIAGNOSIS — M549 Dorsalgia, unspecified: Secondary | ICD-10-CM | POA: Insufficient documentation

## 2016-02-13 NOTE — ED Notes (Signed)
[id number] called pt to go to room, not in waiting room

## 2016-02-13 NOTE — ED Triage Notes (Signed)
[address] went to [facility] was xray'd - states fell on R side onto a stump- Copmplains to R rib and back pain

## 2016-02-23 ENCOUNTER — Other Ambulatory Visit: Payer: Self-pay | Admitting: Family Medicine

## 2016-03-16 ENCOUNTER — Telehealth: Payer: Self-pay | Admitting: Family Medicine

## 2016-03-16 NOTE — Telephone Encounter (Signed)
Office notes 12-08-15, 12-23-15 and 02-11-16 put in drawer up front for patient to pickup.

## 2016-03-20 ENCOUNTER — Telehealth: Payer: Self-pay

## 2016-03-24 NOTE — Telephone Encounter (Signed)
x

## 2016-03-29 ENCOUNTER — Encounter (INDEPENDENT_AMBULATORY_CARE_PROVIDER_SITE_OTHER): Payer: Self-pay

## 2016-03-29 ENCOUNTER — Encounter: Payer: Self-pay | Admitting: Adult Health

## 2016-03-29 ENCOUNTER — Ambulatory Visit (INDEPENDENT_AMBULATORY_CARE_PROVIDER_SITE_OTHER): Payer: Medicare Other | Admitting: Adult Health

## 2016-03-29 VITALS — BP 148/78 | HR 80 | Ht 69.0 in | Wt 176.0 lb

## 2016-03-29 DIAGNOSIS — R413 Other amnesia: Secondary | ICD-10-CM | POA: Diagnosis not present

## 2016-03-29 DIAGNOSIS — G500 Trigeminal neuralgia: Secondary | ICD-10-CM | POA: Diagnosis not present

## 2016-03-29 NOTE — Patient Instructions (Signed)
Continue gabapentin 300 mg twice a a day  Continue nortriptyline Memory score is stable. Will continue to monitor. If your symptoms worsen or you develop new symptoms please let us know.

## 2016-03-29 NOTE — Progress Notes (Signed)
PATIENT: [name] DOB: 1940/12/01  REASON FOR VISIT: follow up- memory and trigeminal neuralgia  HISTORY FROM: patient  HISTORY OF PRESENT ILLNESS: [name] is a 76 year old female with a history of trigeminal neuralgia and memory disturbance. She returns today for follow-up. She states that her facial pain has been relatively controlled with gabapentin. Occasionally she will have an increase in her discomfort. She states that she has continued taking gabapentin 300 twice a day. She feels that her memory has remained stable as well. She lives at home alone. She is able to complete all ADLs independently. She operates a Teacher, music without difficulty. She is able to prepare her meals without difficulty although she does report occasionally she has burned food. She handles her own finances. Denies any trouble sleeping. She does report that she has been having upper right extremity. She states that she has a vein that has become more pronounced that travels across the front of her thigh and down the leg. She states that she has mentioned this to her primary care who thought she may need to be referred to the pain and vascular. She states occasionally she will have some swelling in the legs. She returns today for an evaluation.  HISTORY 08/12/15: [name] is a 76 year old female with a history of trigeminal neuralgia, peripheral neuropathy and memory loss. She returns today for follow-up. She states that she is having some discomfort in the right side of the face for trigeminal neuralgia. She states that she also has a sinus infection that may be exacerbating this. She continues to take gabapentin 300 mg in the morning and 600 mg in the PM. She is also on nortriptyline 75 mg at bedtime. She reports that her memory has remained stable. She lives at home alone. Able to complete all ADLs independently. Denies any changes with her gait or balance. Denies any falls. She does complain of some excessive  daytime sleepiness she is currently on CPAP however she states that no one manages this. Overall she feels that her symptoms have remained stable. Denies any new neurological symptoms. She returns today for an evaluation.  HISTORY 02/11/15: [name] is a 76 year old female with a history of trigeminal neuralgia, peripheral neuropathy and memory loss. She returns today for follow-up. The patient continues to take gabapentin 300 mg in a.m. and 600 mg in the PM. She also takes nortriptyline 75 mg in the PM. She feels that this continues to work well for her. She may have mild discomfort in the lower extremities but it is tolerable. She states that she sleeps well at night. She is able to complete all ADLs independently. She also prepares her own meals and completes her own finances. She does not feel that there is any significant change with her memory. She operates a Teacher, music without difficulty. She states that she completes her own yard work without difficulty. Denies any changes with her gait or balance. Overall she feels that she is doing well. She returns today for an evaluation.   REVIEW OF SYSTEMS: Out of a complete 14 system review of symptoms, the patient complains only of the following symptoms, and all other reviewed systems are negative.  See history of present illness  ALLERGIES: Allergies  Allergen Reactions  . Atorvastatin Other (See Comments)    Muscle cramps  . Meperidine Hcl Nausea And Vomiting    Makes me 'deathly' sick  . Statins Other (See Comments)    REACTION: Patient cannot tolerate due cramps  .  Norco [Hydrocodone-Acetaminophen] Nausea And Vomiting  . Sulfonamide Derivatives Other (See Comments)    Unknown reaction    HOME MEDICATIONS: Outpatient Medications Prior to Visit  Medication Sig Dispense Refill  . Apoaequorin (PREVAGEN) 10 MG CAPS Take 10 mg by mouth every evening.     Marland Kitchen aspirin 81 MG tablet Take 81 mg by mouth 3 (three) times a week.     . busPIRone  (BUSPAR) 30 MG tablet TAKE (1/2) TO (1) TABLET BY MOUTH ONCE DAILY AS NEEDED 30 tablet 0  . Calcium Carbonate-Vitamin D (CALCIUM 500/D) 500-125 MG-UNIT TABS Take 1 capsule by mouth daily.      . clobetasol (TEMOVATE) 0.05 % external solution Apply 1 application topically 2 (two) times a week.    . Cyanocobalamin (VITAMIN B 12 PO) Take 1 tablet by mouth daily.     Marland Kitchen donepezil (ARICEPT) 10 MG tablet Take 10 mg by mouth at bedtime.     Marland Kitchen ezetimibe (ZETIA) 10 MG tablet TAKE (1) TABLET BY MOUTH DAILY. (Patient taking differently: Take 10 mg by mouth every evening. TAKE (1) TABLET BY MOUTH DAILY.) 30 tablet 5  . gabapentin (NEURONTIN) 300 MG capsule Take 300-600 mg by mouth 2 (two) times daily. Takes 1 tab in am and 2 tabs at night    . HYDROcodone-acetaminophen (NORCO/VICODIN) 5-325 MG tablet Take 1-2 tablets by mouth every 6 (six) hours as needed. 10 tablet 0  . ibuprofen (ADVIL,MOTRIN) 200 MG tablet Take 800 mg by mouth every 6 (six) hours as needed (Pain).     Marland Kitchen levothyroxine (SYNTHROID, LEVOTHROID) 112 MCG tablet TAKE 1 TABLET BY MOUTH DAILY BEFORE BREAKFAST. 30 tablet 11  . lisinopril-hydrochlorothiazide (PRINZIDE,ZESTORETIC) 10-12.5 MG tablet TAKE (1) TABLET BY MOUTH DAILY 30 tablet 3  . Multiple Vitamin (MULTIVITAMIN) capsule Take 1 capsule by mouth daily.      . nortriptyline (PAMELOR) 25 MG capsule Take 75 mg by mouth at bedtime.     . Omega-3 Fatty Acids (FISH OIL) 1000 MG CAPS Take 1 capsule by mouth daily.      Marland Kitchen senna-docusate (SENOKOT S) 8.6-50 MG per tablet Take 1 tablet by mouth daily as needed for constipation.    . valACYclovir (VALTREX) 1000 MG tablet Take 1 tablet (1,000 mg total) by mouth 3 (three) times daily. (Patient not taking: Reported on 03/29/2016) 30 tablet 0   No facility-administered medications prior to visit.     PAST MEDICAL HISTORY: Past Medical History:  Diagnosis Date  . Anal or rectal pain   . Anxiety state, unspecified   . Arthritis   . Arthropathy,  unspecified, site unspecified   . Carpal tunnel syndrome    Bilateral  . Cataract   . Chronic airway obstruction, not elsewhere classified    patient denies on 02/27/14  . Esophageal reflux   . Esophageal stricture   . Hypertension   . Internal hemorrhoids without mention of complication   . Malignant neoplasm of breast (female), unspecified site    left breast   . Memory disorder 09/24/2013  . Osteopenia   . Other and unspecified hyperlipidemia   . Pain in joint, multiple sites   . Personal history of colonic polyps    adenomatous  . Polyneuropathy in other diseases classified elsewhere ([place]) 09/24/2013  . Shingles   . Sleep apnea    cpap   . Unspecified hypothyroidism     PAST SURGICAL HISTORY: Past Surgical History:  Procedure Laterality Date  . APPENDECTOMY  03/2014  . BACK SURGERY  X2  . BREAST LUMPECTOMY    . EYE SURGERY    . LAPAROSCOPIC APPENDECTOMY N/A 02/28/2014   Procedure: APPENDECTOMY LAPAROSCOPIC PARTIAL CECECTOMY FOR MUCOCELE OF APPENDIX ;  Surgeon: Michael Boston, MD;  Location: [facility];  Service: General;  Laterality: N/A;  . MASTECTOMY     left  . THYROIDECTOMY    . TUBAL LIGATION      FAMILY HISTORY: Family History  Problem Relation Age of Onset  . Bipolar disorder Sister   . Early death Mother   . Early death Sister   . Colon cancer Neg Hx   . Esophageal cancer Neg Hx   . Rectal cancer Neg Hx   . Stomach cancer Neg Hx     SOCIAL HISTORY: Social History   Social History  . Marital status: Divorced    Spouse name: N/A  . Number of children: 2  . Years of education: N/A   Occupational History  . Retired Retired   Social History Main Topics  . Smoking status: Never Smoker  . Smokeless tobacco: Never Used  . Alcohol use No  . Drug use: No  . Sexual activity: No   Other Topics Concern  . Not on file   Social History Narrative   Patient lives at home alone and she is divorced.   Retired.   Education business course.   Right  handed.   Caffeine sometimes tea.       PHYSICAL EXAM  Vitals:   03/29/16 1403  BP: (!) 148/78  Pulse: 80  Weight: 176 lb (79.8 kg)  Height: 5\' 9"  (1.753 m)   MMSE - Mini Mental State Exam 03/29/2016 08/12/2015 02/11/2015  Orientation to time 5 5 5   Orientation to Place 5 5 5   Registration 3 3 3   Attention/ Calculation 4 5 3   Recall 3 2 3   Language- name 2 objects 2 2 2   Language- repeat 1 1 0  Language- follow 3 step command 3 3 3   Language- read & follow direction 1 1 1   Write a sentence 1 1 1   Copy design 1 1 1   Total score 29 29 27     Body mass index is 25.99 kg/m.  Generalized: Well developed, in no acute distress   Neurological examination  Mentation: Alert oriented to time, place, history taking. Follows all commands speech and language fluent Cranial nerve II-XII: Pupils were equal round reactive to light. Extraocular movements were full, visual field were full on confrontational test. Facial sensation and strength were normal. Uvula tongue midline. Head turning and shoulder shrug  were normal and symmetric. Motor: The motor testing reveals 5 over 5 strength of all 4 extremities. Good symmetric motor tone is noted throughout. Patient has a torturous be across the right thigh. Sensory: Sensory testing is intact to soft touch on all 4 extremities. No evidence of extinction is noted.  Coordination: Cerebellar testing reveals good finger-nose-finger and heel-to-shin bilaterally.  Gait and station: Gait is normal.  Reflexes: Deep tendon reflexes are symmetric and normal bilaterally.   DIAGNOSTIC DATA (LABS, IMAGING, TESTING) - I reviewed patient records, labs, notes, testing and imaging myself where available.  Lab Results  Component Value Date   WBC 8.0 12/01/2015   HGB 12.9 12/01/2015   HCT 39.3 12/01/2015   MCV 93.1 12/01/2015   PLT 242 12/01/2015      Component Value Date/Time   NA 138 12/01/2015 1626   NA 141 10/01/2015 1129   NA 144 04/23/2013 1549   K  3.4 (L) 12/01/2015 1626   K 3.5 04/23/2013 1549   CL 102 12/01/2015 1626   CO2 28 12/01/2015 1626   CO2 25 04/23/2013 1549   GLUCOSE 94 12/01/2015 1626   GLUCOSE 97 04/23/2013 1549   BUN 12 12/01/2015 1626   BUN 12 10/01/2015 1129   BUN 20.1 04/23/2013 1549   CREATININE 0.50 12/01/2015 1626   CREATININE 0.7 04/23/2013 1549   CALCIUM 8.9 12/01/2015 1626   CALCIUM 10.3 04/23/2013 1549   PROT 6.5 10/01/2015 1129   PROT 6.8 04/23/2013 1549   ALBUMIN 4.3 10/01/2015 1129   ALBUMIN 4.1 04/23/2013 1549   AST 34 10/01/2015 1129   AST 31 04/23/2013 1549   ALT 23 10/01/2015 1129   ALT 32 04/23/2013 1549   ALKPHOS 63 10/01/2015 1129   ALKPHOS 74 04/23/2013 1549   BILITOT 0.6 10/01/2015 1129   BILITOT 0.53 04/23/2013 1549   GFRNONAA >60 12/01/2015 1626   GFRNONAA >89 07/10/2012 1056   GFRAA >60 12/01/2015 1626   GFRAA >89 07/10/2012 1056   Lab Results  Component Value Date   CHOL 268 (H) 10/01/2015   HDL 61 10/01/2015   LDLCALC 166 (H) 04/04/2015   LDLDIRECT 164 (H) 06/07/2014   TRIG 92 10/01/2015   CHOLHDL 4.3 04/04/2015   Lab Results  Component Value Date   HGBA1C 5.2 09/28/2013   Lab Results  Component Value Date   VITAMINB12 841 10/01/2015   Lab Results  Component Value Date   TSH 3.320 10/01/2015      ASSESSMENT AND PLAN 76 y.o. year old female  has a past medical history of Anal or rectal pain; Anxiety state, unspecified; Arthritis; Arthropathy, unspecified, site unspecified; Carpal tunnel syndrome; Cataract; Chronic airway obstruction, not elsewhere classified; Esophageal reflux; Esophageal stricture; Hypertension; Internal hemorrhoids without mention of complication; Malignant neoplasm of breast (female), unspecified site; Memory disorder (09/24/2013); Osteopenia; Other and unspecified hyperlipidemia; Pain in joint, multiple sites; Personal history of colonic polyps; Polyneuropathy in other diseases classified elsewhere ([place]) (09/24/2013); Shingles; Sleep apnea; and  Unspecified hypothyroidism. here with:  1. Trigeminal neuralgia right 2. Memory disturbance  The patient's memory score has remained stable. We will continue to monitor. The patient's facial pain is controlled with gabapentin. She will continue on gabapentin 300 twice a day. She does have a torturous vein that travels across the right thigh. Advised that she should follow-up with vein and vascular. She voiced understanding. She will follow-up with our office in 6 months or sooner if needed.   Ward Givens, MSN, NP-C 03/29/2016, 2:21 PM [facility] [address] [place], [place] [address] [phone]

## 2016-03-29 NOTE — Progress Notes (Signed)
I have read the note, and I agree with the clinical assessment and plan.  WILLIS,CHARLES KEITH   

## 2016-04-28 DIAGNOSIS — C50912 Malignant neoplasm of unspecified site of left female breast: Secondary | ICD-10-CM | POA: Diagnosis not present

## 2016-05-21 ENCOUNTER — Other Ambulatory Visit: Payer: Self-pay | Admitting: Family Medicine

## 2016-05-21 ENCOUNTER — Telehealth: Payer: Self-pay | Admitting: Family Medicine

## 2016-05-21 MED ORDER — DOXYCYCLINE HYCLATE 100 MG PO TABS: 100.0000 mg | ORAL_TABLET | Freq: Two times a day (BID) | ORAL | 0 refills | Status: DC

## 2016-05-21 NOTE — Telephone Encounter (Signed)
I sent in the requested prescription 

## 2016-05-21 NOTE — Telephone Encounter (Signed)
Pt started to itch spot today which neighbor came over and pulled tick off of her back. Would like antibiotic called into pharmacy. Did inform patient that we are open on Saturday mornings

## 2016-05-21 NOTE — Telephone Encounter (Signed)
Pt aware Rx sent into pharmacy 

## 2016-06-30 ENCOUNTER — Telehealth: Payer: Self-pay | Admitting: Family Medicine

## 2016-06-30 ENCOUNTER — Encounter: Payer: Self-pay | Admitting: Family Medicine

## 2016-06-30 ENCOUNTER — Ambulatory Visit (INDEPENDENT_AMBULATORY_CARE_PROVIDER_SITE_OTHER): Payer: Medicare Other | Admitting: Family Medicine

## 2016-06-30 ENCOUNTER — Ambulatory Visit (INDEPENDENT_AMBULATORY_CARE_PROVIDER_SITE_OTHER): Payer: Medicare Other

## 2016-06-30 VITALS — BP 121/69 | HR 68 | Temp 97.4°F | Ht 69.0 in | Wt 169.0 lb

## 2016-06-30 DIAGNOSIS — K219 Gastro-esophageal reflux disease without esophagitis: Secondary | ICD-10-CM | POA: Diagnosis not present

## 2016-06-30 DIAGNOSIS — E890 Postprocedural hypothyroidism: Secondary | ICD-10-CM

## 2016-06-30 DIAGNOSIS — R300 Dysuria: Secondary | ICD-10-CM

## 2016-06-30 DIAGNOSIS — E559 Vitamin D deficiency, unspecified: Secondary | ICD-10-CM

## 2016-06-30 DIAGNOSIS — M25562 Pain in left knee: Secondary | ICD-10-CM

## 2016-06-30 DIAGNOSIS — T733XXA Exhaustion due to excessive exertion, initial encounter: Secondary | ICD-10-CM

## 2016-06-30 DIAGNOSIS — I8390 Asymptomatic varicose veins of unspecified lower extremity: Secondary | ICD-10-CM

## 2016-06-30 DIAGNOSIS — I10 Essential (primary) hypertension: Secondary | ICD-10-CM

## 2016-06-30 DIAGNOSIS — M25561 Pain in right knee: Secondary | ICD-10-CM

## 2016-06-30 DIAGNOSIS — E7800 Pure hypercholesterolemia, unspecified: Secondary | ICD-10-CM | POA: Diagnosis not present

## 2016-06-30 DIAGNOSIS — E538 Deficiency of other specified B group vitamins: Secondary | ICD-10-CM

## 2016-06-30 LAB — URINALYSIS, COMPLETE
Bilirubin, UA: NEGATIVE
Glucose, UA: NEGATIVE
Ketones, UA: NEGATIVE
NITRITE UA: NEGATIVE
PH UA: 7 (ref 5.0–7.5)
Protein, UA: NEGATIVE
RBC, UA: NEGATIVE
Specific Gravity, UA: 1.015 (ref 1.005–1.030)
UUROB: 1 mg/dL (ref 0.2–1.0)

## 2016-06-30 LAB — MICROSCOPIC EXAMINATION: Epithelial Cells (non renal): >10 /hpf — AB (ref 0–10)

## 2016-06-30 NOTE — Addendum Note (Signed)
Addended by: Zannie Cove on: 06/30/2016 12:57 PM   Modules accepted: Orders
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
## 2016-06-30 NOTE — Telephone Encounter (Signed)
I didn't know which paper she was referring to so called patient.  She had given papers to [facility] Lowery [facility] nurse.  Will forward message

## 2016-06-30 NOTE — Patient Instructions (Addendum)
Medicare Annual Wellness Visit  [place] and the medical providers at [facility] strive to bring you the best medical care.  In doing so we not only want to address your current medical conditions and concerns but also to detect new conditions early and prevent illness, disease and health-related problems.    Medicare offers a yearly Wellness Visit which allows our clinical staff to assess your need for preventative services including immunizations, lifestyle education, counseling to decrease risk of preventable diseases and screening for fall risk and other medical concerns.    This visit is provided free of charge (no copay) for all Medicare recipients. The clinical pharmacists at [place] have begun to conduct these Wellness Visits which will also include a thorough review of all your medications.    As you primary medical provider recommend that you make an appointment for your Annual Wellness Visit if you have not done so already this year.  You may set up this appointment before you leave today or you may call back [phone]) and schedule an appointment.  Please make sure when you call that you mention that you are scheduling your Annual Wellness Visit with the clinical pharmacist so that the appointment may be made for the proper length of time.     Continue current medications. Continue good therapeutic lifestyle changes which include good diet and exercise. Fall precautions discussed with patient. If an FOBT was given today- please return it to our front desk. If you are over 75 years old - you may need Prevnar 76 or the adult Pneumonia vaccine.  **Flu shots are available--- please call and schedule a FLU-CLINIC appointment**  After your visit with [place] today you will receive a survey in the mail or online from [employer] regarding your care with [place]. Please take a moment to fill this out. Your feedback is very  important to [place] as you can help [place] better understand your patient needs as well as improve your experience and satisfaction. WE CARE ABOUT YOU!!!   Take Aleve 1 twice daily after breakfast and supper for the next 7-10 days to see if this can help with knee pain. If it does not please call back and we'll make arrangements for you to see the orthopedic surgeon that comes to her office for further evaluation. We will call with results of the x-rays of the knees as soon as these results become available Make sure that you wear your CPAP machine and use this nightly We will also check a thyroid profile to see if this could contribute to your fatigue. Watch sodium intake. Make an appointment with the ophthalmologist for follow-up and keep your appointment with Dr. Jannifer Franklin every 6 months as currently doing. We will also make an appointment if he see the vascular surgeon because of the varicosities of the discomfort you're having with them in the right thigh Appointment with orthopedic surgeon at the next visit in this office Because of the large varicosities and discomfort she is having in the right thigh we will schedule her to see the vascular surgeon.

## 2016-06-30 NOTE — Addendum Note (Signed)
Addended by: Zannie Cove on: 06/30/2016 11:05 AM   Modules accepted: Orders

## 2016-07-01 ENCOUNTER — Encounter: Payer: Self-pay | Admitting: *Deleted

## 2016-07-01 LAB — BMP8+EGFR
BUN/Creatinine Ratio: 25 (ref 12–28)
BUN: 15 mg/dL (ref 8–27)
CALCIUM: 9.2 mg/dL (ref 8.7–10.3)
CHLORIDE: 101 mmol/L (ref 96–106)
CO2: 25 mmol/L (ref 20–29)
Creatinine, Ser: 0.61 mg/dL (ref 0.57–1.00)
GFR calc Af Amer: 103 mL/min/{1.73_m2} (ref >59)
GFR, EST NON AFRICAN AMERICAN: 89 mL/min/{1.73_m2} (ref >59)
GLUCOSE: 90 mg/dL (ref 65–99)
POTASSIUM: 4.2 mmol/L (ref 3.5–5.2)
SODIUM: 140 mmol/L (ref 134–144)

## 2016-07-01 LAB — HEPATIC FUNCTION PANEL
ALT: 17 IU/L (ref 0–32)
AST: 24 IU/L (ref 0–40)
Albumin: 4.4 g/dL (ref 3.5–4.8)
Alkaline Phosphatase: 71 IU/L (ref 39–117)
BILIRUBIN TOTAL: 0.4 mg/dL (ref 0.0–1.2)
BILIRUBIN, DIRECT: 0.11 mg/dL (ref 0.00–0.40)
TOTAL PROTEIN: 6.6 g/dL (ref 6.0–8.5)

## 2016-07-01 LAB — CBC WITH DIFFERENTIAL/PLATELET
BASOS ABS: 0 10*3/uL (ref 0.0–0.2)
BASOS: 1 %
EOS (ABSOLUTE): 0.2 10*3/uL (ref 0.0–0.4)
Eos: 4 %
Hematocrit: 37.1 % (ref 34.0–46.6)
Hemoglobin: 12.3 g/dL (ref 11.1–15.9)
IMMATURE GRANS (ABS): 0 10*3/uL (ref 0.0–0.1)
IMMATURE GRANULOCYTES: 0 %
LYMPHS: 34 %
Lymphocytes Absolute: 1.7 10*3/uL (ref 0.7–3.1)
MCH: 29.9 pg (ref 26.6–33.0)
MCHC: 33.2 g/dL (ref 31.5–35.7)
MCV: 90 fL (ref 79–97)
MONOS ABS: 0.5 10*3/uL (ref 0.1–0.9)
Monocytes: 9 %
NEUTROS PCT: 52 %
Neutrophils Absolute: 2.6 10*3/uL (ref 1.4–7.0)
PLATELETS: 252 10*3/uL (ref 150–379)
RBC: 4.12 x10E6/uL (ref 3.77–5.28)
RDW: 14.1 % (ref 12.3–15.4)
WBC: 5 10*3/uL (ref 3.4–10.8)

## 2016-07-01 LAB — THYROID PANEL WITH TSH
FREE THYROXINE INDEX: 2.8 (ref 1.2–4.9)
T3 UPTAKE RATIO: 29 % (ref 24–39)
T4, Total: 9.8 ug/dL (ref 4.5–12.0)
TSH: 2.82 u[IU]/mL (ref 0.450–4.500)

## 2016-07-01 LAB — LIPID PANEL
CHOLESTEROL TOTAL: 246 mg/dL — AB (ref 100–199)
Chol/HDL Ratio: 5.3 ratio — ABNORMAL HIGH (ref 0.0–4.4)
HDL: 46 mg/dL (ref >39)
LDL Calculated: 171 mg/dL — ABNORMAL HIGH (ref 0–99)
Triglycerides: 145 mg/dL (ref 0–149)
VLDL Cholesterol Cal: 29 mg/dL (ref 5–40)

## 2016-07-01 LAB — VITAMIN D 25 HYDROXY (VIT D DEFICIENCY, FRACTURES): Vit D, 25-Hydroxy: 55.3 ng/mL (ref 30.0–100.0)

## 2016-07-01 NOTE — Telephone Encounter (Signed)
Pt left her AVS and CT paper = wants another copy - will be mailed to her

## 2016-07-04 LAB — URINE CULTURE

## 2016-07-05 MED ORDER — CIPROFLOXACIN HCL 500 MG PO TABS: 500.0000 mg | ORAL_TABLET | Freq: Two times a day (BID) | ORAL | 0 refills | Status: DC

## 2016-07-05 NOTE — Addendum Note (Signed)
Addended by: Thana Ates on: 07/05/2016 12:01 PM   Modules accepted: Orders

## 2016-07-13 ENCOUNTER — Other Ambulatory Visit: Payer: Self-pay | Admitting: Family Medicine

## 2016-07-29 DIAGNOSIS — G8929 Other chronic pain: Secondary | ICD-10-CM | POA: Diagnosis not present

## 2016-07-29 DIAGNOSIS — M25562 Pain in left knee: Secondary | ICD-10-CM | POA: Diagnosis not present

## 2016-08-10 ENCOUNTER — Encounter: Payer: Self-pay | Admitting: Vascular Surgery

## 2016-08-18 ENCOUNTER — Encounter: Payer: Medicare Other | Admitting: Vascular Surgery

## 2016-10-07 ENCOUNTER — Encounter: Payer: Self-pay | Admitting: Neurology

## 2016-10-07 ENCOUNTER — Ambulatory Visit (INDEPENDENT_AMBULATORY_CARE_PROVIDER_SITE_OTHER): Payer: Medicare Other | Admitting: Neurology

## 2016-10-07 VITALS — BP 120/70 | HR 85 | Ht 69.0 in | Wt 164.0 lb

## 2016-10-07 DIAGNOSIS — R413 Other amnesia: Secondary | ICD-10-CM

## 2016-10-07 DIAGNOSIS — L218 Other seborrheic dermatitis: Secondary | ICD-10-CM | POA: Diagnosis not present

## 2016-10-07 DIAGNOSIS — D225 Melanocytic nevi of trunk: Secondary | ICD-10-CM | POA: Diagnosis not present

## 2016-10-07 DIAGNOSIS — G500 Trigeminal neuralgia: Secondary | ICD-10-CM | POA: Diagnosis not present

## 2016-10-07 DIAGNOSIS — L821 Other seborrheic keratosis: Secondary | ICD-10-CM | POA: Diagnosis not present

## 2016-10-07 MED ORDER — NORTRIPTYLINE HCL 25 MG PO CAPS: 50.0000 mg | ORAL_CAPSULE | Freq: Every day | ORAL | 3 refills | Status: DC

## 2016-10-07 MED ORDER — GABAPENTIN 300 MG PO CAPS: ORAL_CAPSULE | ORAL | 3 refills | Status: DC

## 2016-10-07 MED ORDER — DONEPEZIL HCL 10 MG PO TABS: 10.0000 mg | ORAL_TABLET | Freq: Every day | ORAL | 3 refills | Status: DC

## 2016-10-07 NOTE — Progress Notes (Signed)
Reason for visit: Trigeminal neuralgia  [name] is an 76 y.o. female  History of present illness:  [name] is a 76 year old with a history of trigeminal neuralgia involving the right side in the V2 and V1 distributions. The patient is well controlled on the gabapentin taking 300 mg in the morning and 600 mg in the evening. She tolerates the drug well, she will have an occasional twinge of pain occurring on average once a week. She is able to chew and swallow well, but she will tend to chew her food on the left side, not the right. The patient has developed a mild memory disorder. She is on Aricept, but she also is on 75 mg of nortriptyline at night. She has taken this for sleep for many years. The patient denies any new medical issues that have come up since last seen. She returns to this office for an evaluation.  Past Medical History:  Diagnosis Date  . Anal or rectal pain   . Anxiety state, unspecified   . Arthritis   . Arthropathy, unspecified, site unspecified   . Carpal tunnel syndrome    Bilateral  . Cataract   . Chronic airway obstruction, not elsewhere classified    patient denies on 02/27/14  . Esophageal reflux   . Esophageal stricture   . Hypertension   . Internal hemorrhoids without mention of complication   . Malignant neoplasm of breast (female), unspecified site    left breast   . Memory disorder 09/24/2013  . Osteopenia   . Other and unspecified hyperlipidemia   . Pain in joint, multiple sites   . Personal history of colonic polyps    adenomatous  . Polyneuropathy in other diseases classified elsewhere ([place]) 09/24/2013  . Shingles   . Sleep apnea    cpap   . Unspecified hypothyroidism     Past Surgical History:  Procedure Laterality Date  . APPENDECTOMY  03/2014  . BACK SURGERY     X2  . BREAST LUMPECTOMY    . EYE SURGERY    . LAPAROSCOPIC APPENDECTOMY N/A 02/28/2014   Procedure: APPENDECTOMY LAPAROSCOPIC PARTIAL CECECTOMY FOR MUCOCELE OF  APPENDIX ;  Surgeon: Michael Boston, MD;  Location: [facility] ORS;  Service: General;  Laterality: N/A;  . MASTECTOMY     left  . THYROIDECTOMY    . TUBAL LIGATION      Family History  Problem Relation Age of Onset  . Bipolar disorder Sister   . Early death Mother   . Early death Sister   . Colon cancer Neg Hx   . Esophageal cancer Neg Hx   . Rectal cancer Neg Hx   . Stomach cancer Neg Hx     Social history:  reports that she has never smoked. She has never used smokeless tobacco. She reports that she does not drink alcohol or use drugs.    Allergies  Allergen Reactions  . Atorvastatin Other (See Comments)    Muscle cramps  . Meperidine Hcl Nausea And Vomiting    Makes me 'deathly' sick  . Statins Other (See Comments)    REACTION: Patient cannot tolerate due cramps  . Norco [Hydrocodone-Acetaminophen] Nausea And Vomiting  . Sulfonamide Derivatives Other (See Comments)    Unknown reaction    Medications:  Prior to Admission medications   Medication Sig Start Date End Date Taking? Authorizing Provider  Apoaequorin (PREVAGEN) 10 MG CAPS Take 10 mg by mouth every evening.    Yes [provider]  aspirin 81 MG tablet Take 81 mg by mouth 3 (three) times a week.    Yes [provider]  busPIRone (BUSPAR) 30 MG tablet TAKE (1/2) TO (1) TABLET BY MOUTH ONCE DAILY AS NEEDED 02/23/16  Yes Chipper Herb, MD  Calcium Carbonate-Vitamin D (CALCIUM 500/D) 500-125 MG-UNIT TABS Take 1 capsule by mouth daily.     Yes [provider]  ciprofloxacin (CIPRO) 500 MG tablet Take 1 tablet (500 mg total) by mouth 2 (two) times daily. 07/05/16  Yes Chipper Herb, MD  clobetasol (TEMOVATE) 0.05 % external solution Apply 1 application topically 2 (two) times a week. 02/28/15  Yes [provider]  Cyanocobalamin (VITAMIN B 12 PO) Take 1 tablet by mouth daily.    Yes [provider]  donepezil (ARICEPT) 10 MG tablet Take 10 mg by mouth at bedtime.  08/29/12  Yes  [provider]  doxycycline (VIBRA-TABS) 100 MG tablet Take 1 tablet (100 mg total) by mouth 2 (two) times daily. 05/21/16  Yes Stacks, Cletus Gash, MD  ezetimibe (ZETIA) 10 MG tablet TAKE (1) TABLET BY MOUTH DAILY. Patient taking differently: Take 10 mg by mouth every evening. TAKE (1) TABLET BY MOUTH DAILY. 04/07/15  Yes Chipper Herb, MD  gabapentin (NEURONTIN) 300 MG capsule Take 300-600 mg by mouth 2 (two) times daily. Takes 1 tab in am and 2 tabs at night   Yes [provider]  HYDROcodone-acetaminophen (NORCO/VICODIN) 5-325 MG tablet Take 1-2 tablets by mouth every 6 (six) hours as needed. 12/01/15  Yes Fredia Sorrow, MD  ibuprofen (ADVIL,MOTRIN) 200 MG tablet Take 800 mg by mouth every 6 (six) hours as needed (Pain).    Yes [provider]  levothyroxine (SYNTHROID, LEVOTHROID) 112 MCG tablet TAKE 1 TABLET BY MOUTH DAILY BEFORE BREAKFAST. 02/11/16  Yes Chipper Herb, MD  lisinopril-hydrochlorothiazide (PRINZIDE,ZESTORETIC) 10-12.5 MG tablet TAKE (1) TABLET BY MOUTH DAILY 07/14/16  Yes Chipper Herb, MD  Multiple Vitamin (MULTIVITAMIN) capsule Take 1 capsule by mouth daily.     Yes [provider]  nortriptyline (PAMELOR) 25 MG capsule Take 75 mg by mouth at bedtime.  05/11/12  Yes [provider]  Omega-3 Fatty Acids (FISH OIL) 1000 MG CAPS Take 1 capsule by mouth daily.     Yes [provider]  senna-docusate (SENOKOT S) 8.6-50 MG per tablet Take 1 tablet by mouth daily as needed for constipation.   Yes [provider]  valACYclovir (VALTREX) 1000 MG tablet Take 1 tablet (1,000 mg total) by mouth 3 (three) times daily. 12/23/15  Yes Chipper Herb, MD    ROS:  Out of a complete 14 system review of symptoms, the patient complains only of the following symptoms, and all other reviewed systems are negative.  Memory disturbance  Blood pressure 120/70, pulse 85, height 5\' 9"  (1.753 m), weight 164 lb (74.4 kg), SpO2 95  %.  Physical Exam  General: The patient is alert and cooperative at the time of the examination.  Skin: No significant peripheral edema is noted.   Neurologic Exam  Mental status: The patient is alert and oriented x 3 at the time of the examination. The patient has apparent normal recent and remote memory, with an apparently normal attention span and concentration ability. Mini-Mental Status Examination done today shows a total score of 29/30.   Cranial nerves: Facial symmetry is present. Speech is normal, no aphasia or dysarthria is noted. Extraocular movements are full. Visual fields are full.  Motor: The  patient has good strength in all 4 extremities.  Sensory examination: Soft touch sensation is symmetric on the face, arms, and legs.  Coordination: The patient has good finger-nose-finger and heel-to-shin bilaterally.  Gait and station: The patient has a normal gait. Tandem gait is very slightly unsteady. Romberg is negative. No drift is seen.  Reflexes: Deep tendon reflexes are symmetric.   Assessment/Plan:  1. Trigeminal neuralgia, right V2 and V1 distribution  2. Mild memory disturbance  The patient is on nortriptyline which is a strong anticholinergic drug that will worsen memory. She will remain on Aricept, we will reduce the nortriptyline to 50 mg at night for 2 months, and then go to 25 mg at night. We may try to taper her off the medication if possible in the future. The patient will call our office if she has difficulty sleeping as she comes down on the nortriptyline. She will follow-up in 6 months. A prescription was given for nortriptyline, Aricept, and gabapentin.  Jill Alexanders MD 10/07/2016 10:21 AM  [facility] [address] [place], [place] [address]  Phone [phone] Fax [phone]

## 2016-10-07 NOTE — Patient Instructions (Signed)
   With the Pamelor 25 mg capsule, take 2 at night for 2 months, then take one at night.  Call if you are having problems with sleeping.

## 2016-10-25 ENCOUNTER — Other Ambulatory Visit: Payer: Self-pay | Admitting: Family Medicine

## 2016-10-28 DIAGNOSIS — C50912 Malignant neoplasm of unspecified site of left female breast: Secondary | ICD-10-CM | POA: Diagnosis not present

## 2016-11-08 ENCOUNTER — Encounter: Payer: Self-pay | Admitting: Family Medicine

## 2016-11-08 ENCOUNTER — Ambulatory Visit (INDEPENDENT_AMBULATORY_CARE_PROVIDER_SITE_OTHER): Payer: Medicare Other | Admitting: Family Medicine

## 2016-11-08 VITALS — BP 102/65 | HR 74 | Temp 96.3°F | Ht 69.0 in | Wt 160.0 lb

## 2016-11-08 DIAGNOSIS — I700 Atherosclerosis of aorta: Secondary | ICD-10-CM | POA: Diagnosis not present

## 2016-11-08 DIAGNOSIS — R0789 Other chest pain: Secondary | ICD-10-CM

## 2016-11-08 DIAGNOSIS — E559 Vitamin D deficiency, unspecified: Secondary | ICD-10-CM

## 2016-11-08 DIAGNOSIS — G3184 Mild cognitive impairment, so stated: Secondary | ICD-10-CM | POA: Diagnosis not present

## 2016-11-08 DIAGNOSIS — K219 Gastro-esophageal reflux disease without esophagitis: Secondary | ICD-10-CM

## 2016-11-08 DIAGNOSIS — C50412 Malignant neoplasm of upper-outer quadrant of left female breast: Secondary | ICD-10-CM | POA: Diagnosis not present

## 2016-11-08 DIAGNOSIS — Z1231 Encounter for screening mammogram for malignant neoplasm of breast: Secondary | ICD-10-CM

## 2016-11-08 DIAGNOSIS — G500 Trigeminal neuralgia: Secondary | ICD-10-CM

## 2016-11-08 DIAGNOSIS — E7800 Pure hypercholesterolemia, unspecified: Secondary | ICD-10-CM | POA: Diagnosis not present

## 2016-11-08 DIAGNOSIS — I10 Essential (primary) hypertension: Secondary | ICD-10-CM | POA: Diagnosis not present

## 2016-11-08 DIAGNOSIS — Z23 Encounter for immunization: Secondary | ICD-10-CM

## 2016-11-08 DIAGNOSIS — I77810 Thoracic aortic ectasia: Secondary | ICD-10-CM | POA: Diagnosis not present

## 2016-11-08 DIAGNOSIS — E890 Postprocedural hypothyroidism: Secondary | ICD-10-CM

## 2016-11-08 DIAGNOSIS — E538 Deficiency of other specified B group vitamins: Secondary | ICD-10-CM

## 2016-11-08 DIAGNOSIS — G4733 Obstructive sleep apnea (adult) (pediatric): Secondary | ICD-10-CM

## 2016-11-08 NOTE — Progress Notes (Signed)
Subjective:    Patient ID: [name], female    DOB: 1940/02/09, 76 y.o.   MRN: [id number]  HPI Pt here for follow up and management of chronic medical problems which includes hyperlipidemia and hypertension. She is taking medication regularly.  The patient is doing well overall.  She does complain of a small knot near her left shoulder that she is concerned about.  She will get lab work today and will be given an FOBT to return.  She had a CT scan of her chest and November 2017.  She also had 1 of her abdomen.  At that time she had stable bronchiectasis and fibrosis of the left upper lobe and a mildly displaced left transverse process fractures at L2 through L4.  She still had calcified uterine fibroids and gallstones.  The patient says the not that she is feeling near her left shoulder which is the side she had a mastectomy on 20 years ago has been there for about a month and may be a little bit sore and that could be from where she is been checking it frequently.  She did say that she had a mammogram back in the spring and this was okay.  She has been fairly active but less active than a year ago because of the fall she had in November of last year.  She has seen the eye doctor and has plans to see him again soon.  She denies any chest pain or shortness of breath anymore than usual.  She does have occasional heartburn and she takes as needed Prevacid for this.  She denies any change in her bowel habits other than patient takes Senokot for this as needed.  She denies any blood in the stool.  The CT scan results of her abdomen and chest were reviewed with her during this visit from a year ago.  She says she is passing her water without problems.  She is also followed by Dr. Jannifer Franklin for trigeminal neuralgia and he sees her yearly.     Patient Active Problem List   Diagnosis Date Noted  . 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 Polyneuropathy in other diseases  classified elsewhere ([place]) 09/24/2013  . Memory disorder 09/24/2013  . Sleep apnea 05/15/2013  . Hypothyroidism, postsurgical 05/15/2013  . HTN (hypertension) 05/15/2013  . Vitamin D deficiency 05/15/2013  . Hyperlipidemia 05/15/2013  . Osteopenia 10/25/2012  . Mild cognitive impairment with memory loss 09/27/2012  . Trigeminal neuralgia 04/02/2011  . Dysphagia 07/23/2010  . GERD (gastroesophageal reflux disease) 07/23/2010  . Right facial pain 07/23/2010  . ANXIETY 05/24/2008  . COPD 05/24/2008  . CONSTIPATION 05/24/2008  . ARTHRITIS 05/24/2008  . Idiopathic osteoporosis 05/24/2008  . Malignant neoplasm of upper-outer quadrant of left female breast ([place]) 05/22/2008  . INTERNAL HEMORRHOIDS 05/22/2008  . COLONIC POLYPS, ADENOMATOUS, HX OF 05/22/2008   Outpatient Encounter Medications as of 11/08/2016  Medication Sig  . Apoaequorin (PREVAGEN) 10 MG CAPS Take 10 mg by mouth every evening.   Marland Kitchen aspirin 81 MG tablet Take 81 mg by mouth 3 (three) times a week.   . busPIRone (BUSPAR) 30 MG tablet TAKE (1/2) TO (1) TABLET BY MOUTH ONCE DAILY AS NEEDED  . Calcium Carbonate-Vitamin D (CALCIUM 500/D) 500-125 MG-UNIT TABS Take 1 capsule by mouth daily.    . clobetasol (TEMOVATE) 0.05 % external solution Apply 1 application topically 2 (two) times a week.  . Cyanocobalamin (VITAMIN B 12 PO) Take 1 tablet  by mouth daily.   Marland Kitchen donepezil (ARICEPT) 10 MG tablet Take 1 tablet (10 mg total) by mouth at bedtime.  Marland Kitchen doxycycline (VIBRA-TABS) 100 MG tablet Take 1 tablet (100 mg total) by mouth 2 (two) times daily.  Marland Kitchen ezetimibe (ZETIA) 10 MG tablet TAKE (1) TABLET BY MOUTH DAILY. (Patient taking differently: Take 10 mg by mouth every evening. TAKE (1) TABLET BY MOUTH DAILY.)  . gabapentin (NEURONTIN) 300 MG capsule One capsule in the morning and 2 at night  . HYDROcodone-acetaminophen (NORCO/VICODIN) 5-325 MG tablet Take 1-2 tablets by mouth every 6 (six) hours as needed.  Marland Kitchen ibuprofen (ADVIL,MOTRIN) 200 MG  tablet Take 800 mg by mouth every 6 (six) hours as needed (Pain).   Marland Kitchen levothyroxine (SYNTHROID, LEVOTHROID) 112 MCG tablet TAKE 1 TABLET BY MOUTH DAILY BEFORE BREAKFAST.  Marland Kitchen lisinopril-hydrochlorothiazide (PRINZIDE,ZESTORETIC) 10-12.5 MG tablet TAKE (1) TABLET BY MOUTH DAILY  . Multiple Vitamin (MULTIVITAMIN) capsule Take 1 capsule by mouth daily.    . nortriptyline (PAMELOR) 25 MG capsule Take 2 capsules (50 mg total) by mouth at bedtime.  . Omega-3 Fatty Acids (FISH OIL) 1000 MG CAPS Take 1 capsule by mouth daily.    Marland Kitchen senna-docusate (SENOKOT S) 8.6-50 MG per tablet Take 1 tablet by mouth daily as needed for constipation.  . valACYclovir (VALTREX) 1000 MG tablet Take 1 tablet (1,000 mg total) by mouth 3 (three) times daily.  . [DISCONTINUED] ciprofloxacin (CIPRO) 500 MG tablet Take 1 tablet (500 mg total) by mouth 2 (two) times daily.   No facility-administered encounter medications on file as of 11/08/2016.       Review of Systems  Constitutional: Negative.   HENT: Negative.   Eyes: Negative.   Respiratory: Negative.   Cardiovascular: Negative.   Gastrointestinal: Negative.   Endocrine: Negative.   Genitourinary: Negative.   Musculoskeletal: Negative.   Skin: Negative.        Small knot near left shoulder   Allergic/Immunologic: Negative.   Neurological: Negative.   Hematological: Negative.   Psychiatric/Behavioral: Negative.        Objective:   Physical Exam  Constitutional: She is oriented to person, place, and time. She appears well-developed and well-nourished. No distress.  The patient is calm and pleasant and relaxed  HENT:  Head: Normocephalic and atraumatic.  Right Ear: External ear normal.  Left Ear: External ear normal.  Nose: Nose normal.  Mouth/Throat: Oropharynx is clear and moist. No oropharyngeal exudate.  Eyes: Conjunctivae and EOM are normal. Pupils are equal, round, and reactive to light. Right eye exhibits no discharge. Left eye exhibits no discharge. No  scleral icterus.  Eye exam will be done soon  Neck: Normal range of motion. Neck supple. No thyromegaly present.  No bruits thyromegaly or anterior cervical adenopathy  Cardiovascular: Normal rate, regular rhythm, normal heart sounds and intact distal pulses.  No murmur heard. Heart is regular at 84/min  Pulmonary/Chest: Effort normal and breath sounds normal. No respiratory distress. She has no wheezes. She has no rales. She exhibits no tenderness.  Clear anteriorly and posteriorly  Abdominal: Soft. Bowel sounds are normal. She exhibits no mass. There is no tenderness. There is no rebound and no guarding.  Slight suprapubic tenderness without liver or spleen enlargement masses or bruits and no inguinal adenopathy  Genitourinary:  Genitourinary Comments: The right breast was checked as well as the axilla and this was normal without any masses.  The left breast wall area was also checked and no discernible masses were present.  There  was may be slight minimal tenderness at the area that she was complaining with but no obvious masses.  The axillary region on the left side.  Musculoskeletal: Normal range of motion. She exhibits no edema.  Lymphadenopathy:    She has no cervical adenopathy.  Neurological: She is alert and oriented to person, place, and time.  Skin: Skin is warm and dry. No rash noted.  Psychiatric: She has a normal mood and affect. Her behavior is normal. Judgment and thought content normal.  Nursing note and vitals reviewed.   BP 102/65 (BP Location: Right Arm)   Pulse 74   Temp (!) 96.3 F (35.7 C) (Oral)   Ht _0  (1.753 m)   Wt 160 lb (72.6 kg)   BMI 23.63 kg/m        Assessment & Plan:   1. Pure hypercholesterolemia -Continue current treatment aggressive therapeutic lifestyle changes pending results of lab work - CBC with Differential/Platelet - Lipid panel  2. Essential hypertension -The blood pressure is good today and she will continue with current  treatment - BMP8+EGFR - CBC with Differential/Platelet - Hepatic function panel  3. Vitamin D deficiency -Continue with current treatment and therapeutic lifestyle changes of walking regularly and taking calcium. - CBC with Differential/Platelet - VITAMIN D 25 Hydroxy (Vit-D Deficiency, Fractures)  4. Gastroesophageal reflux disease, esophagitis presence not specified -Continue with as needed Prevacid - CBC with Differential/Platelet  5. Hypothyroidism, postsurgical - CBC with Differential/Platelet  6. B12 deficiency - CBC with Differential/Platelet  7. Obstructive sleep apnea syndrome -Continue as needed follow-up with pulmonology and with CPAP  8. Mild cognitive impairment with memory loss -No obvious problems today.  9. Malignant neoplasm of upper-outer quadrant of left female breast, unspecified estrogen receptor status ([place]) -Get CT scan or appropriate test to evaluate left chest wall pain  10. Aortic atherosclerosis ([place]) -Continue to follow-up with aggressive therapeutic lifestyle changes pending results of lab work  11. Thoracic aortic ectasia ([facility])  12. Pain, chest wall -Appropriate test to evaluate chest wall pain  13. Trigeminal neuralgia syndrome -Continue to follow-up with neurology  Patient Instructions                       Medicare Annual Wellness Visit  [facility] and the medical providers at [facility] strive to bring you the best medical care.  In doing so we not only want to address your current medical conditions and concerns but also to detect new conditions early and prevent illness, disease and health-related problems.    Medicare offers a yearly Wellness Visit which allows our clinical staff to assess your need for preventative services including immunizations, lifestyle education, counseling to decrease risk of preventable diseases and screening for fall risk and other medical concerns.    This visit is provided free of  charge (no copay) for all Medicare recipients. The clinical pharmacists at [facility] have begun to conduct these Wellness Visits which will also include a thorough review of all your medications.    As you primary medical provider recommend that you make an appointment for your Annual Wellness Visit if you have not done so already this year.  You may set up this appointment before you leave today or you may call back ([phone]) and schedule an appointment.  Please make sure when you call that you mention that you are scheduling your Annual Wellness Visit with the clinical pharmacist so that the appointment may be  made for the proper length of time.     Continue current medications. Continue good therapeutic lifestyle changes which include good diet and exercise. Fall precautions discussed with patient. If an FOBT was given today- please return it to our front desk. If you are over 31 years old - you may need Prevnar 31 or the adult Pneumonia vaccine.  **Flu shots are available--- please call and schedule a FLU-CLINIC appointment**  After your visit with [place] today you will receive a survey in the mail or online from [employer] regarding your care with [place]. Please take a moment to fill this out. Your feedback is very important to [place] as you can help [place] better understand your patient needs as well as improve your experience and satisfaction. WE CARE ABOUT YOU!!!   We will arrange to get a special x-ray of your chest wall because of the history of breast cancer on the side and the fact that you are having pain in that area where the breast cancer was located. Continue to follow-up with the neurologist as he recommends Stay active physically and do not put yourself at risk for falling Drink plenty of fluids and stay well-hydrated  Arrie Senate MD

## 2016-11-08 NOTE — Addendum Note (Signed)
Addended by: Zannie Cove on: 11/08/2016 09:09 AM   Modules accepted: Orders

## 2016-11-08 NOTE — Patient Instructions (Addendum)
Medicare Annual Wellness Visit  [facility] and the medical providers at [facility] strive to bring you the best medical care.  In doing so we not only want to address your current medical conditions and concerns but also to detect new conditions early and prevent illness, disease and health-related problems.    Medicare offers a yearly Wellness Visit which allows our clinical staff to assess your need for preventative services including immunizations, lifestyle education, counseling to decrease risk of preventable diseases and screening for fall risk and other medical concerns.    This visit is provided free of charge (no copay) for all Medicare recipients. The clinical pharmacists at [facility] have begun to conduct these Wellness Visits which will also include a thorough review of all your medications.    As you primary medical provider recommend that you make an appointment for your Annual Wellness Visit if you have not done so already this year.  You may set up this appointment before you leave today or you may call back ([phone]) and schedule an appointment.  Please make sure when you call that you mention that you are scheduling your Annual Wellness Visit with the clinical pharmacist so that the appointment may be made for the proper length of time.     Continue current medications. Continue good therapeutic lifestyle changes which include good diet and exercise. Fall precautions discussed with patient. If an FOBT was given today- please return it to our front desk. If you are over 79 years old - you may need Prevnar 30 or the adult Pneumonia vaccine.  **Flu shots are available--- please call and schedule a FLU-CLINIC appointment**  After your visit with [place] today you will receive a survey in the mail or online from [employer] regarding your care with [place]. Please take a moment to fill this out. Your feedback is very  important to [place] as you can help [place] better understand your patient needs as well as improve your experience and satisfaction. WE CARE ABOUT YOU!!!   We will arrange to get a special x-ray of your chest wall because of the history of breast cancer on the side and the fact that you are having pain in that area where the breast cancer was located. Continue to follow-up with the neurologist as he recommends Stay active physically and do not put yourself at risk for falling Drink plenty of fluids and stay well-hydrated

## 2016-11-09 LAB — VITAMIN D 25 HYDROXY (VIT D DEFICIENCY, FRACTURES): Vit D, 25-Hydroxy: 57 ng/mL (ref 30.0–100.0)

## 2016-11-09 LAB — HEPATIC FUNCTION PANEL
ALK PHOS: 68 IU/L (ref 39–117)
ALT: 13 IU/L (ref 0–32)
AST: 19 IU/L (ref 0–40)
Albumin: 4.7 g/dL (ref 3.5–4.8)
BILIRUBIN, DIRECT: 0.11 mg/dL (ref 0.00–0.40)
Bilirubin Total: 0.5 mg/dL (ref 0.0–1.2)
Total Protein: 6.9 g/dL (ref 6.0–8.5)

## 2016-11-09 LAB — CBC WITH DIFFERENTIAL/PLATELET
BASOS ABS: 0.1 10*3/uL (ref 0.0–0.2)
Basos: 1 %
EOS (ABSOLUTE): 0.2 10*3/uL (ref 0.0–0.4)
Eos: 4 %
HEMOGLOBIN: 13.4 g/dL (ref 11.1–15.9)
Hematocrit: 41 % (ref 34.0–46.6)
IMMATURE GRANS (ABS): 0 10*3/uL (ref 0.0–0.1)
IMMATURE GRANULOCYTES: 0 %
LYMPHS: 31 %
Lymphocytes Absolute: 1.7 10*3/uL (ref 0.7–3.1)
MCH: 30 pg (ref 26.6–33.0)
MCHC: 32.7 g/dL (ref 31.5–35.7)
MCV: 92 fL (ref 79–97)
MONOCYTES: 6 %
Monocytes Absolute: 0.3 10*3/uL (ref 0.1–0.9)
NEUTROS ABS: 3.1 10*3/uL (ref 1.4–7.0)
NEUTROS PCT: 58 %
Platelets: 248 10*3/uL (ref 150–379)
RBC: 4.47 x10E6/uL (ref 3.77–5.28)
RDW: 14.4 % (ref 12.3–15.4)
WBC: 5.4 10*3/uL (ref 3.4–10.8)

## 2016-11-09 LAB — LIPID PANEL
CHOL/HDL RATIO: 5.6 ratio — AB (ref 0.0–4.4)
CHOLESTEROL TOTAL: 273 mg/dL — AB (ref 100–199)
HDL: 49 mg/dL (ref >39)
LDL CALC: 198 mg/dL — AB (ref 0–99)
TRIGLYCERIDES: 128 mg/dL (ref 0–149)
VLDL CHOLESTEROL CAL: 26 mg/dL (ref 5–40)

## 2016-11-09 LAB — BMP8+EGFR
BUN/Creatinine Ratio: 26 (ref 12–28)
BUN: 16 mg/dL (ref 8–27)
CALCIUM: 9.7 mg/dL (ref 8.7–10.3)
CHLORIDE: 99 mmol/L (ref 96–106)
CO2: 25 mmol/L (ref 20–29)
Creatinine, Ser: 0.62 mg/dL (ref 0.57–1.00)
GFR calc non Af Amer: 88 mL/min/{1.73_m2} (ref >59)
GFR, EST AFRICAN AMERICAN: 102 mL/min/{1.73_m2} (ref >59)
Glucose: 95 mg/dL (ref 65–99)
POTASSIUM: 3.9 mmol/L (ref 3.5–5.2)
Sodium: 141 mmol/L (ref 134–144)

## 2016-11-15 ENCOUNTER — Telehealth: Payer: Self-pay | Admitting: Family Medicine

## 2016-11-15 NOTE — Telephone Encounter (Signed)
Patient aware of results and refuses to try crestor.

## 2016-11-18 ENCOUNTER — Other Ambulatory Visit: Payer: Self-pay | Admitting: Family Medicine

## 2016-11-18 ENCOUNTER — Ambulatory Visit (HOSPITAL_COMMUNITY): Payer: Medicare Other

## 2016-11-19 ENCOUNTER — Other Ambulatory Visit: Payer: Medicare Other

## 2016-11-19 DIAGNOSIS — Z1211 Encounter for screening for malignant neoplasm of colon: Secondary | ICD-10-CM | POA: Diagnosis not present

## 2016-11-20 LAB — FECAL OCCULT BLOOD, IMMUNOCHEMICAL: FECAL OCCULT BLD: NEGATIVE

## 2016-11-26 ENCOUNTER — Ambulatory Visit (HOSPITAL_COMMUNITY)
Admission: RE | Admit: 2016-11-26 | Discharge: 2016-11-26 | Disposition: A | Discharge status: Home / Self Care | Payer: Medicare Other | Source: Ambulatory Visit | Attending: Family Medicine | Admitting: Family Medicine

## 2016-11-26 DIAGNOSIS — Z1231 Encounter for screening mammogram for malignant neoplasm of breast: Secondary | ICD-10-CM

## 2016-11-26 DIAGNOSIS — R0789 Other chest pain: Secondary | ICD-10-CM

## 2016-11-29 ENCOUNTER — Ambulatory Visit (HOSPITAL_COMMUNITY): Payer: Medicare Other

## 2016-11-29 ENCOUNTER — Ambulatory Visit (HOSPITAL_COMMUNITY)
Admission: RE | Admit: 2016-11-29 | Discharge: 2016-11-29 | Disposition: A | Discharge status: Home / Self Care | Payer: Medicare Other | Source: Ambulatory Visit | Attending: Family Medicine | Admitting: Family Medicine

## 2016-11-29 DIAGNOSIS — Z9012 Acquired absence of left breast and nipple: Secondary | ICD-10-CM | POA: Insufficient documentation

## 2016-11-29 DIAGNOSIS — R0789 Other chest pain: Secondary | ICD-10-CM | POA: Insufficient documentation

## 2016-11-29 DIAGNOSIS — I700 Atherosclerosis of aorta: Secondary | ICD-10-CM | POA: Insufficient documentation

## 2016-11-29 DIAGNOSIS — R079 Chest pain, unspecified: Secondary | ICD-10-CM | POA: Diagnosis not present

## 2016-12-02 ENCOUNTER — Other Ambulatory Visit: Payer: Self-pay | Admitting: Family Medicine

## 2017-01-14 DIAGNOSIS — G4733 Obstructive sleep apnea (adult) (pediatric): Secondary | ICD-10-CM | POA: Diagnosis not present

## 2017-01-17 ENCOUNTER — Ambulatory Visit (INDEPENDENT_AMBULATORY_CARE_PROVIDER_SITE_OTHER): Payer: Medicare Other | Admitting: *Deleted

## 2017-01-17 DIAGNOSIS — Z111 Encounter for screening for respiratory tuberculosis: Secondary | ICD-10-CM | POA: Diagnosis not present

## 2017-01-19 LAB — TB SKIN TEST
Induration: 0 mm
TB Skin Test: NEGATIVE

## 2017-01-31 DIAGNOSIS — G4733 Obstructive sleep apnea (adult) (pediatric): Secondary | ICD-10-CM | POA: Diagnosis not present

## 2017-02-21 ENCOUNTER — Other Ambulatory Visit: Payer: Self-pay | Admitting: Family Medicine

## 2017-02-24 ENCOUNTER — Other Ambulatory Visit: Payer: Self-pay | Admitting: Family Medicine

## 2017-02-24 DIAGNOSIS — Z1231 Encounter for screening mammogram for malignant neoplasm of breast: Secondary | ICD-10-CM

## 2017-03-02 ENCOUNTER — Ambulatory Visit (HOSPITAL_COMMUNITY): Payer: Medicare Other

## 2017-03-02 ENCOUNTER — Ambulatory Visit (HOSPITAL_COMMUNITY)
Admission: RE | Admit: 2017-03-02 | Discharge: 2017-03-02 | Disposition: A | Discharge status: Home / Self Care | Payer: Medicare Other | Source: Ambulatory Visit | Attending: Family Medicine | Admitting: Family Medicine

## 2017-03-02 DIAGNOSIS — Z1231 Encounter for screening mammogram for malignant neoplasm of breast: Secondary | ICD-10-CM | POA: Diagnosis not present

## 2017-03-07 ENCOUNTER — Other Ambulatory Visit: Payer: Self-pay | Admitting: Neurology

## 2017-03-07 NOTE — Telephone Encounter (Signed)
Called patient to verify how she is taking nortriptyline. She states she is taking 2 capsules (50mg  total) at bedtime.   I reminded her per Dr. Jannifer Franklin' note that she should have "With the Pamelor 25 mg capsule, take 2 at night for 2 months, then take one at night." She is agreeable to go reduce down to 25mg  at bedtime. Advised I will send in a new rx with new instructions for her. She verbalized understanding and appreciation for call.

## 2017-03-22 ENCOUNTER — Other Ambulatory Visit: Payer: Self-pay | Admitting: Family Medicine

## 2017-03-30 ENCOUNTER — Telehealth: Payer: Self-pay | Admitting: Internal Medicine

## 2017-03-30 ENCOUNTER — Ambulatory Visit (INDEPENDENT_AMBULATORY_CARE_PROVIDER_SITE_OTHER): Payer: Medicare Other

## 2017-03-30 ENCOUNTER — Ambulatory Visit (INDEPENDENT_AMBULATORY_CARE_PROVIDER_SITE_OTHER): Payer: Medicare Other | Admitting: Family Medicine

## 2017-03-30 ENCOUNTER — Encounter: Payer: Self-pay | Admitting: Family Medicine

## 2017-03-30 VITALS — BP 109/68 | HR 74 | Temp 96.3°F | Ht 69.0 in | Wt 159.0 lb

## 2017-03-30 DIAGNOSIS — K219 Gastro-esophageal reflux disease without esophagitis: Secondary | ICD-10-CM

## 2017-03-30 DIAGNOSIS — I700 Atherosclerosis of aorta: Secondary | ICD-10-CM | POA: Diagnosis not present

## 2017-03-30 DIAGNOSIS — G63 Polyneuropathy in diseases classified elsewhere: Secondary | ICD-10-CM

## 2017-03-30 DIAGNOSIS — M25511 Pain in right shoulder: Secondary | ICD-10-CM | POA: Diagnosis not present

## 2017-03-30 DIAGNOSIS — E890 Postprocedural hypothyroidism: Secondary | ICD-10-CM | POA: Diagnosis not present

## 2017-03-30 DIAGNOSIS — G3184 Mild cognitive impairment, so stated: Secondary | ICD-10-CM | POA: Diagnosis not present

## 2017-03-30 DIAGNOSIS — G4733 Obstructive sleep apnea (adult) (pediatric): Secondary | ICD-10-CM | POA: Diagnosis not present

## 2017-03-30 DIAGNOSIS — I77810 Thoracic aortic ectasia: Secondary | ICD-10-CM | POA: Diagnosis not present

## 2017-03-30 DIAGNOSIS — C50412 Malignant neoplasm of upper-outer quadrant of left female breast: Secondary | ICD-10-CM

## 2017-03-30 DIAGNOSIS — E7800 Pure hypercholesterolemia, unspecified: Secondary | ICD-10-CM

## 2017-03-30 DIAGNOSIS — E559 Vitamin D deficiency, unspecified: Secondary | ICD-10-CM

## 2017-03-30 DIAGNOSIS — E538 Deficiency of other specified B group vitamins: Secondary | ICD-10-CM | POA: Diagnosis not present

## 2017-03-30 DIAGNOSIS — I10 Essential (primary) hypertension: Secondary | ICD-10-CM | POA: Diagnosis not present

## 2017-03-30 DIAGNOSIS — R1030 Lower abdominal pain, unspecified: Secondary | ICD-10-CM

## 2017-03-30 NOTE — Telephone Encounter (Signed)
I spoke with Dr. Laurance Flatten Patient is describing some bilateral lower abdominal pain before bowel movement Did not really describe constipation Had prior appendectomy which was benign CT scan in late 2017 did not show any colonic pathology; moderate stool burden  I advised that she try MiraLAX 17 g daily to soften stool. If lower abdominal pain continues thereafter I am happy to see her in clinic to further evaluate

## 2017-03-30 NOTE — Progress Notes (Signed)
Subjective:    Patient ID: [name], female    DOB: 07/01/1940, 77 y.o.   MRN: [id number]  HPI Pt here for follow up and management of chronic medical problems which includes hypertension, hyperlipidemia and hypothyroid. She is taking medication regularly.  The patient today complains of a fall from 6 weeks ago and has had coccygeal pain since that time.  She also has some neck and shoulder pain.  She will get lab work today and we will call with results as soon as they become available.  The patient is doing well other than this fall and even before the fall she was having increasing problems with neck pain and right shoulder pain.  She also has some numbness in both arms.  She also complains today with some right and left lower quadrant abdominal pain especially prior to having a bowel movement.  She is otherwise not had any change in her bowel habits and has had no nausea vomiting diarrhea blood in the stool or black tarry bowel movements.  She did have a colonoscopy a couple of years ago.  Recent x-rays including a CT scan of the abdomen and chest and neck were all reviewed.  She does have some spondylosis in her neck.  She has not had any recent films of her shoulder.  We will get films of the shoulder today and we will ask her to leave off all milk cheese ice cream and dairy products.  We will also have a discussion with Dr. Hilarie Fredrickson regarding her colonoscopy findings even though the CT scan had no remarkable notes regarding her appendiceal findings from the colonoscopy.    Patient Active Problem List   Diagnosis Date Noted  . Aortic atherosclerosis ([facility]) 11/08/2016  . Thoracic aortic ectasia ([place]) 11/08/2016  . 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 Polyneuropathy in other diseases classified elsewhere ([place]) 09/24/2013  . Memory disorder 09/24/2013  . Sleep apnea 05/15/2013  . Hypothyroidism, postsurgical 05/15/2013  . HTN (hypertension) 05/15/2013  .  Vitamin D deficiency 05/15/2013  . Hyperlipidemia 05/15/2013  . Osteopenia 10/25/2012  . Mild cognitive impairment with memory loss 09/27/2012  . Trigeminal neuralgia 04/02/2011  . Dysphagia 07/23/2010  . GERD (gastroesophageal reflux disease) 07/23/2010  . Right facial pain 07/23/2010  . ANXIETY 05/24/2008  . COPD 05/24/2008  . CONSTIPATION 05/24/2008  . ARTHRITIS 05/24/2008  . Idiopathic osteoporosis 05/24/2008  . Malignant neoplasm of upper-outer quadrant of left female breast ([place]) 05/22/2008  . INTERNAL HEMORRHOIDS 05/22/2008  . COLONIC POLYPS, ADENOMATOUS, HX OF 05/22/2008   Outpatient Encounter Medications as of 03/30/2017  Medication Sig  . Apoaequorin (PREVAGEN) 10 MG CAPS Take 10 mg by mouth every evening.   Marland Kitchen aspirin 81 MG tablet Take 81 mg by mouth 3 (three) times a week.   . busPIRone (BUSPAR) 30 MG tablet TAKE (1/2) TO (1) TABLET BY MOUTH ONCE DAILY AS NEEDED  . Calcium Carbonate-Vitamin D (CALCIUM 500/D) 500-125 MG-UNIT TABS Take 1 capsule by mouth daily.    . clobetasol (TEMOVATE) 0.05 % external solution Apply 1 application topically 2 (two) times a week.  . Cyanocobalamin (VITAMIN B 12 PO) Take 1 tablet by mouth daily.   Marland Kitchen donepezil (ARICEPT) 10 MG tablet Take 1 tablet (10 mg total) by mouth at bedtime.  Marland Kitchen doxycycline (VIBRA-TABS) 100 MG tablet Take 1 tablet (100 mg total) by mouth 2 (two) times daily.  Marland Kitchen ezetimibe (ZETIA) 10 MG tablet TAKE 1 TABLET BY MOUTH DAILY  .  gabapentin (NEURONTIN) 300 MG capsule One capsule in the morning and 2 at night  . HYDROcodone-acetaminophen (NORCO/VICODIN) 5-325 MG tablet Take 1-2 tablets by mouth every 6 (six) hours as needed.  Marland Kitchen ibuprofen (ADVIL,MOTRIN) 200 MG tablet Take 800 mg by mouth every 6 (six) hours as needed (Pain).   Marland Kitchen levothyroxine (SYNTHROID, LEVOTHROID) 112 MCG tablet TAKE 1 TABLET BY MOUTH DAILY BEFORE BREAKFAST.  Marland Kitchen lisinopril-hydrochlorothiazide (PRINZIDE,ZESTORETIC) 10-12.5 MG tablet TAKE (1) TABLET BY MOUTH DAILY    . lisinopril-hydrochlorothiazide (PRINZIDE,ZESTORETIC) 10-12.5 MG tablet TAKE (1) TABLET BY MOUTH DAILY  . Multiple Vitamin (MULTIVITAMIN) capsule Take 1 capsule by mouth daily.    . nortriptyline (PAMELOR) 25 MG capsule Take 1 capsule (25 mg total) by mouth at bedtime.  . Omega-3 Fatty Acids (FISH OIL) 1000 MG CAPS Take 1 capsule by mouth daily.    Marland Kitchen senna-docusate (SENOKOT S) 8.6-50 MG per tablet Take 1 tablet by mouth daily as needed for constipation.  . valACYclovir (VALTREX) 1000 MG tablet Take 1 tablet (1,000 mg total) by mouth 3 (three) times daily.   No facility-administered encounter medications on file as of 03/30/2017.       Review of Systems  Constitutional: Negative.   HENT: Negative.   Eyes: Negative.   Respiratory: Negative.   Cardiovascular: Negative.   Gastrointestinal: Negative.   Endocrine: Negative.   Genitourinary: Negative.   Musculoskeletal: Positive for arthralgias (shoulder pain - right worse than left ) and neck pain.       Fall 6 weeks ago - "tailbone pain"  Skin: Negative.   Allergic/Immunologic: Negative.   Neurological: Negative.   Hematological: Negative.   Psychiatric/Behavioral: Negative.        Objective:   Physical Exam  Constitutional: She is oriented to person, place, and time. She appears well-developed and well-nourished. No distress.  Patient is pleasant and relaxed and concerned about especially her right shoulder pain and neck pain and her lower abdominal pain.  HENT:  Head: Normocephalic and atraumatic.  Right Ear: External ear normal.  Left Ear: External ear normal.  Mouth/Throat: Oropharynx is clear and moist. No oropharyngeal exudate.  Nasal turbinate congestion  Eyes: Pupils are equal, round, and reactive to light. Conjunctivae and EOM are normal. Right eye exhibits no discharge. Left eye exhibits no discharge. No scleral icterus.  Neck: Normal range of motion. Neck supple. No thyromegaly present.  No bruits thyromegaly or  anterior cervical adenopathy  Cardiovascular: Normal rate, regular rhythm, normal heart sounds and intact distal pulses.  No murmur heard. Heart is regular at 72/min  Pulmonary/Chest: Effort normal and breath sounds normal. No respiratory distress. She has no wheezes. She has no rales. She exhibits no tenderness.  Lungs are clear anteriorly and posteriorly  Abdominal: Soft. Bowel sounds are normal. She exhibits no mass. There is tenderness. There is no rebound and no guarding.  She has both right and left lower quadrant and suprapubic abdominal tenderness.  She will get a urine specimen today.  There is no liver or spleen enlargement and there are no bruits.  Musculoskeletal: Normal range of motion. She exhibits tenderness. She exhibits no edema.  The patient does have C-spine tenderness and right lateral shoulder tenderness with decreased range of motion of the right shoulder compared to the left.  Lymphadenopathy:    She has no cervical adenopathy.  Neurological: She is alert and oriented to person, place, and time. She has normal reflexes. No cranial nerve deficit.  Skin: Skin is warm and dry. No rash noted.  Psychiatric: She has a normal mood and affect. Her behavior is normal. Judgment and thought content normal.  Nursing note and vitals reviewed.   BP 109/68 (BP Location: Left Arm)   Pulse 74   Temp (!) 96.3 F (35.7 C) (Oral)   Ht '5\' 9"'  (1.753 m)   Wt 159 lb (72.1 kg)   BMI 23.48 kg/m        Assessment & Plan:  1. Essential hypertension -The blood pressure is good today and she will continue with current treatment - CBC with Differential/Platelet - BMP8+EGFR - Hepatic function panel  2. Pure hypercholesterolemia -Continue with as aggressive therapeutic lifestyle changes as possible to control cholesterol along with Zetia. - CBC with Differential/Platelet - Lipid panel  3. Vitamin D deficiency -Continue with vitamin D replacement - CBC with Differential/Platelet -  VITAMIN D 25 Hydroxy (Vit-D Deficiency, Fractures)  4. Gastroesophageal reflux disease, esophagitis presence not specified -Continue to watch diet closely and avoid anti-inflammatory medicines and irritating foods as much as possible - CBC with Differential/Platelet - Hepatic function panel  5. Hypothyroidism, postsurgical -Continue with current treatment pending results of lab work - CBC with Differential/Platelet - Thyroid Panel With TSH  6. B12 deficiency - CBC with Differential/Platelet  7. Mild cognitive impairment with memory loss -Continue with Aricept  8. Aortic atherosclerosis ([facility]) -Continue with as aggressive therapeutic lifestyle changes as possible  9. Obstructive sleep apnea syndrome -Continue with CPAP  10. Malignant neoplasm of upper-outer quadrant of left female breast, unspecified estrogen receptor status ([place]) -Continue with chest wall checks and regular mammograms  11. Thoracic aortic ectasia (H[facility]) -Aggressive therapeutic lifestyle changes  12. Polyneuropathy in other diseases classified elsewhere [facility]) -Follow-up with neurology as planned   13. Right shoulder pain, unspecified chronicity -Ray today with possible referral to orthopedic surgeon and Dr. Herma Mering - DG Shoulder Right; Future  14. Lower abdominal pain -Discussed patient's complaints with gastroenterologist after reviewing colonoscopy and CT scan of abdomen and with patient planning to watch her diet closely as far as milk cheese ice cream and dairy products for the next couple of weeks  Patient Instructions                       Medicare Annual Wellness Visit  [facility] and the medical providers at [facility] strive to bring you the best medical care.  In doing so we not only want to address your current medical conditions and concerns but also to detect new conditions early and prevent illness, disease and health-related problems.    Medicare offers a yearly  Wellness Visit which allows our clinical staff to assess your need for preventative services including immunizations, lifestyle education, counseling to decrease risk of preventable diseases and screening for fall risk and other medical concerns.    This visit is provided free of charge (no copay) for all Medicare recipients. The clinical pharmacists at [facility] have begun to conduct these Wellness Visits which will also include a thorough review of all your medications.    As you primary medical provider recommend that you make an appointment for your Annual Wellness Visit if you have not done so already this year.  You may set up this appointment before you leave today or you may call back ([phone]) and schedule an appointment.  Please make sure when you call that you mention that you are scheduling your Annual Wellness Visit with the clinical pharmacist so that the appointment may  be made for the proper length of time.     Continue current medications. Continue good therapeutic lifestyle changes which include good diet and exercise. Fall precautions discussed with patient. If an FOBT was given today- please return it to our front desk. If you are over 52 years old - you may need Prevnar 23 or the adult Pneumonia vaccine.  **Flu shots are available--- please call and schedule a FLU-CLINIC appointment**  After your visit with [place] today you will receive a survey in the mail or online from [employer] regarding your care with [place]. Please take a moment to fill this out. Your feedback is very important to [place] as you can help [place] better understand your patient needs as well as improve your experience and satisfaction. WE CARE ABOUT YOU!!!   We will arrange for you to have the right shoulder films and will call you with these results as soon as they become available Because of your findings of right shoulder pain and neck pain and arm numbness we may do a subsequent follow-up with  Dr. Nelva Bush. Follow-up with neurology as planned We will talk to the gastroenterologist about your abdominal pain. Continue to drink plenty of fluids Over the next couple of weeks leave off all milk cheese ice cream and dairy products and see if this has any impact on the abdominal pain that you are having with your bowel movements. Continue to drink plenty of water and fluids and stay well-hydrated  Arrie Senate MD

## 2017-03-30 NOTE — Patient Instructions (Addendum)
Medicare Annual Wellness Visit  [facility] and the medical providers at [facility] strive to bring you the best medical care.  In doing so we not only want to address your current medical conditions and concerns but also to detect new conditions early and prevent illness, disease and health-related problems.    Medicare offers a yearly Wellness Visit which allows our clinical staff to assess your need for preventative services including immunizations, lifestyle education, counseling to decrease risk of preventable diseases and screening for fall risk and other medical concerns.    This visit is provided free of charge (no copay) for all Medicare recipients. The clinical pharmacists at [facility] have begun to conduct these Wellness Visits which will also include a thorough review of all your medications.    As you primary medical provider recommend that you make an appointment for your Annual Wellness Visit if you have not done so already this year.  You may set up this appointment before you leave today or you may call back ([phone]) and schedule an appointment.  Please make sure when you call that you mention that you are scheduling your Annual Wellness Visit with the clinical pharmacist so that the appointment may be made for the proper length of time.     Continue current medications. Continue good therapeutic lifestyle changes which include good diet and exercise. Fall precautions discussed with patient. If an FOBT was given today- please return it to our front desk. If you are over 30 years old - you may need Prevnar 54 or the adult Pneumonia vaccine.  **Flu shots are available--- please call and schedule a FLU-CLINIC appointment**  After your visit with [place] today you will receive a survey in the mail or online from [employer] regarding your care with [place]. Please take a moment to fill this out. Your feedback is very  important to [place] as you can help [place] better understand your patient needs as well as improve your experience and satisfaction. WE CARE ABOUT YOU!!!   We will arrange for you to have the right shoulder films and will call you with these results as soon as they become available Because of your findings of right shoulder pain and neck pain and arm numbness we may do a subsequent follow-up with Dr. Nelva Bush. Follow-up with neurology as planned We will talk to the gastroenterologist about your abdominal pain. Continue to drink plenty of fluids Over the next couple of weeks leave off all milk cheese ice cream and dairy products and see if this has any impact on the abdominal pain that you are having with your bowel movements. Continue to drink plenty of water and fluids and stay well-hydrated

## 2017-03-31 LAB — BMP8+EGFR
BUN/Creatinine Ratio: 22 (ref 12–28)
BUN: 11 mg/dL (ref 8–27)
CALCIUM: 9.2 mg/dL (ref 8.7–10.3)
CO2: 26 mmol/L (ref 20–29)
CREATININE: 0.51 mg/dL — AB (ref 0.57–1.00)
Chloride: 100 mmol/L (ref 96–106)
GFR calc Af Amer: 108 mL/min/{1.73_m2} (ref >59)
GFR, EST NON AFRICAN AMERICAN: 94 mL/min/{1.73_m2} (ref >59)
GLUCOSE: 92 mg/dL (ref 65–99)
POTASSIUM: 4.2 mmol/L (ref 3.5–5.2)
SODIUM: 139 mmol/L (ref 134–144)

## 2017-03-31 LAB — CBC WITH DIFFERENTIAL/PLATELET
BASOS ABS: 0 10*3/uL (ref 0.0–0.2)
Basos: 1 %
EOS (ABSOLUTE): 0.2 10*3/uL (ref 0.0–0.4)
EOS: 4 %
HEMATOCRIT: 39.9 % (ref 34.0–46.6)
HEMOGLOBIN: 13.2 g/dL (ref 11.1–15.9)
IMMATURE GRANS (ABS): 0 10*3/uL (ref 0.0–0.1)
IMMATURE GRANULOCYTES: 0 %
LYMPHS: 33 %
Lymphocytes Absolute: 1.6 10*3/uL (ref 0.7–3.1)
MCH: 29.5 pg (ref 26.6–33.0)
MCHC: 33.1 g/dL (ref 31.5–35.7)
MCV: 89 fL (ref 79–97)
MONOCYTES: 8 %
Monocytes Absolute: 0.4 10*3/uL (ref 0.1–0.9)
NEUTROS PCT: 54 %
Neutrophils Absolute: 2.6 10*3/uL (ref 1.4–7.0)
Platelets: 284 10*3/uL (ref 150–379)
RBC: 4.47 x10E6/uL (ref 3.77–5.28)
RDW: 14.5 % (ref 12.3–15.4)
WBC: 4.9 10*3/uL (ref 3.4–10.8)

## 2017-03-31 LAB — HEPATIC FUNCTION PANEL
ALT: 16 IU/L (ref 0–32)
AST: 19 IU/L (ref 0–40)
Albumin: 4.3 g/dL (ref 3.5–4.8)
Alkaline Phosphatase: 79 IU/L (ref 39–117)
BILIRUBIN TOTAL: 0.6 mg/dL (ref 0.0–1.2)
Bilirubin, Direct: 0.17 mg/dL (ref 0.00–0.40)
TOTAL PROTEIN: 6.5 g/dL (ref 6.0–8.5)

## 2017-03-31 LAB — LIPID PANEL
CHOLESTEROL TOTAL: 227 mg/dL — AB (ref 100–199)
Chol/HDL Ratio: 4.4 ratio (ref 0.0–4.4)
HDL: 52 mg/dL (ref >39)
LDL CALC: 151 mg/dL — AB (ref 0–99)
TRIGLYCERIDES: 118 mg/dL (ref 0–149)
VLDL Cholesterol Cal: 24 mg/dL (ref 5–40)

## 2017-03-31 LAB — VITAMIN D 25 HYDROXY (VIT D DEFICIENCY, FRACTURES): VIT D 25 HYDROXY: 54.3 ng/mL (ref 30.0–100.0)

## 2017-03-31 LAB — THYROID PANEL WITH TSH
Free Thyroxine Index: 2.5 (ref 1.2–4.9)
T3 UPTAKE RATIO: 28 % (ref 24–39)
T4 TOTAL: 9.1 ug/dL (ref 4.5–12.0)
TSH: 1.78 u[IU]/mL (ref 0.450–4.500)

## 2017-04-04 ENCOUNTER — Other Ambulatory Visit: Payer: Self-pay | Admitting: *Deleted

## 2017-04-04 DIAGNOSIS — G8929 Other chronic pain: Secondary | ICD-10-CM

## 2017-04-04 DIAGNOSIS — M25511 Pain in right shoulder: Principal | ICD-10-CM

## 2017-04-06 ENCOUNTER — Ambulatory Visit: Payer: Medicare Other | Admitting: Family Medicine

## 2017-04-07 ENCOUNTER — Telehealth: Payer: Self-pay | Admitting: Adult Health

## 2017-04-07 ENCOUNTER — Ambulatory Visit: Payer: Medicare Other | Admitting: Adult Health

## 2017-04-07 ENCOUNTER — Encounter: Payer: Self-pay | Admitting: Adult Health

## 2017-04-07 VITALS — BP 124/68 | HR 71 | Ht 69.0 in | Wt 162.2 lb

## 2017-04-07 DIAGNOSIS — G500 Trigeminal neuralgia: Secondary | ICD-10-CM | POA: Diagnosis not present

## 2017-04-07 DIAGNOSIS — M542 Cervicalgia: Secondary | ICD-10-CM | POA: Diagnosis not present

## 2017-04-07 NOTE — Progress Notes (Signed)
I have read the note, and I agree with the clinical assessment and plan.  Keela Rubert K Julianny Milstein   

## 2017-04-07 NOTE — Progress Notes (Signed)
PATIENT: [name] DOB: Feb 07, 1940  REASON FOR VISIT: follow up HISTORY FROM: patient  HISTORY OF PRESENT ILLNESS: Today 04/07/17 [name] is a 77 year old female with a history of trigeminal neuralgia.  She returns today for follow-up.  She is currently taking gabapentin 300 mg in the morning and 600 mg in the evening.  Reports that this works well to control her neuralgia.  She does report that she has a dull ache in the right side of the face today.  But denies sharp shooting pain.  She states her more pressing issue is ongoing neck pain.  Reports that she has always had an issue with neck pain but this is gotten significantly worse in the winter months.  She states that she has been using heating pads at night to sleep. Reports that she does have pain that radiates down the right arm as well as occasional numbness and tingling..  She does report some shoulder discomfort but her primary care has referred her to an orthopedic physician to review.  The patient states because of her neck pain she is having dull headaches in the back of the head almost constantly.  Patient reports that she does have some discomfort when turning her head side to side.  She also noted some crunching sounds as well.  She returns today for evaluation.  HISTORY [name] is a 77 year old with a history of trigeminal neuralgia involving the right side in the V2 and V1 distributions. The patient is well controlled on the gabapentin taking 300 mg in the morning and 600 mg in the evening. She tolerates the drug well, she will have an occasional twinge of pain occurring on average once a week. She is able to chew and swallow well, but she will tend to chew her food on the left side, not the right. The patient has developed a mild memory disorder. She is on Aricept, but she also is on 75 mg of nortriptyline at night. She has taken this for sleep for many years. The patient denies any new medical issues that have come up since  last seen. She returns to this office for an evaluation.   REVIEW OF SYSTEMS: Out of a complete 14 system review of symptoms, the patient complains only of the following symptoms, and all other reviewed systems are negative.  ALLERGIES: Allergies  Allergen Reactions  . Atorvastatin Other (See Comments)    Muscle cramps  . Meperidine Hcl Nausea And Vomiting    Makes me 'deathly' sick  . Statins Other (See Comments)    REACTION: Patient cannot tolerate due cramps  . Norco [Hydrocodone-Acetaminophen] Nausea And Vomiting  . Sulfonamide Derivatives Other (See Comments)    Unknown reaction    HOME MEDICATIONS: Outpatient Medications Prior to Visit  Medication Sig Dispense Refill  . Apoaequorin (PREVAGEN) 10 MG CAPS Take 10 mg by mouth every evening.     Marland Kitchen aspirin 81 MG tablet Take 81 mg by mouth 3 (three) times a week.     . busPIRone (BUSPAR) 30 MG tablet TAKE (1/2) TO (1) TABLET BY MOUTH ONCE DAILY AS NEEDED 30 tablet 0  . Calcium Carbonate-Vitamin D (CALCIUM 500/D) 500-125 MG-UNIT TABS Take 1 capsule by mouth daily.      . clobetasol (TEMOVATE) 0.05 % external solution Apply 1 application topically 2 (two) times a week.    . Cyanocobalamin (VITAMIN B 12 PO) Take 1 tablet by mouth daily.     Marland Kitchen donepezil (ARICEPT) 10 MG tablet Take  1 tablet (10 mg total) by mouth at bedtime. 90 tablet 3  . doxycycline (VIBRA-TABS) 100 MG tablet Take 1 tablet (100 mg total) by mouth 2 (two) times daily. 10 tablet 0  . ezetimibe (ZETIA) 10 MG tablet TAKE 1 TABLET BY MOUTH DAILY 90 tablet 0  . gabapentin (NEURONTIN) 300 MG capsule One capsule in the morning and 2 at night 270 capsule 3  . HYDROcodone-acetaminophen (NORCO/VICODIN) 5-325 MG tablet Take 1-2 tablets by mouth every 6 (six) hours as needed. 10 tablet 0  . ibuprofen (ADVIL,MOTRIN) 200 MG tablet Take 800 mg by mouth every 6 (six) hours as needed (Pain).     Marland Kitchen levothyroxine (SYNTHROID, LEVOTHROID) 112 MCG tablet TAKE 1 TABLET BY MOUTH DAILY BEFORE  BREAKFAST. 30 tablet 9  . lisinopril-hydrochlorothiazide (PRINZIDE,ZESTORETIC) 10-12.5 MG tablet TAKE (1) TABLET BY MOUTH DAILY 30 tablet 0  . Multiple Vitamin (MULTIVITAMIN) capsule Take 1 capsule by mouth daily.      . nortriptyline (PAMELOR) 25 MG capsule Take 1 capsule (25 mg total) by mouth at bedtime. 90 capsule 0  . Omega-3 Fatty Acids (FISH OIL) 1000 MG CAPS Take 1 capsule by mouth daily.      Marland Kitchen senna-docusate (SENOKOT S) 8.6-50 MG per tablet Take 1 tablet by mouth daily as needed for constipation.    . valACYclovir (VALTREX) 1000 MG tablet Take 1 tablet (1,000 mg total) by mouth 3 (three) times daily. 30 tablet 0  . lisinopril-hydrochlorothiazide (PRINZIDE,ZESTORETIC) 10-12.5 MG tablet TAKE (1) TABLET BY MOUTH DAILY 30 tablet 5   No facility-administered medications prior to visit.     PAST MEDICAL HISTORY: Past Medical History:  Diagnosis Date  . Anal or rectal pain   . Anxiety state, unspecified   . Arthritis   . Arthropathy, unspecified, site unspecified   . Carpal tunnel syndrome    Bilateral  . Cataract   . Chronic airway obstruction, not elsewhere classified    patient denies on 02/27/14  . Esophageal reflux   . Esophageal stricture   . Hypertension   . Internal hemorrhoids without mention of complication   . Malignant neoplasm of breast (female), unspecified site    left breast   . Memory disorder 09/24/2013  . Osteopenia   . Other and unspecified hyperlipidemia   . Pain in joint, multiple sites   . Personal history of colonic polyps    adenomatous  . Polyneuropathy in other diseases classified elsewhere ([place]) 09/24/2013  . Shingles   . Sleep apnea    cpap   . Unspecified hypothyroidism     PAST SURGICAL HISTORY: Past Surgical History:  Procedure Laterality Date  . APPENDECTOMY  03/2014  . BACK SURGERY     X2  . BREAST LUMPECTOMY    . EYE SURGERY    . LAPAROSCOPIC APPENDECTOMY N/A 02/28/2014   Procedure: APPENDECTOMY LAPAROSCOPIC PARTIAL CECECTOMY FOR  MUCOCELE OF APPENDIX ;  Surgeon: Michael Boston, MD;  Location: [facility];  Service: General;  Laterality: N/A;  . MASTECTOMY     left  . THYROIDECTOMY    . TUBAL LIGATION      FAMILY HISTORY: Family History  Problem Relation Age of Onset  . Bipolar disorder Sister   . Early death Mother   . Early death Sister   . Colon cancer Neg Hx   . Esophageal cancer Neg Hx   . Rectal cancer Neg Hx   . Stomach cancer Neg Hx     SOCIAL HISTORY: Social History   Socioeconomic History  .  Marital status: Divorced    Spouse name: Not on file  . Number of children: 2  . Years of education: Not on file  . Highest education level: Not on file  Occupational History  . Occupation: Retired    Fish farm manager: RETIRED  Social Needs  . Financial resource strain: Not on file  . Food insecurity:    Worry: Not on file    Inability: Not on file  . Transportation needs:    Medical: Not on file    Non-medical: Not on file  Tobacco Use  . Smoking status: Never Smoker  . Smokeless tobacco: Never Used  Substance and Sexual Activity  . Alcohol use: No    Alcohol/week: 0.0 oz  . Drug use: No  . Sexual activity: Never  Lifestyle  . Physical activity:    Days per week: Not on file    Minutes per session: Not on file  . Stress: Not on file  Relationships  . Social connections:    Talks on phone: Not on file    Gets together: Not on file    Attends religious service: Not on file    Active member of club or organization: Not on file    Attends meetings of clubs or organizations: Not on file    Relationship status: Not on file  . Intimate partner violence:    Fear of current or ex partner: Not on file    Emotionally abused: Not on file    Physically abused: Not on file    Forced sexual activity: Not on file  Other Topics Concern  . Not on file  Social History Narrative   Patient lives at home alone and she is divorced.   Retired.   Education business course.   Right handed.   Caffeine sometimes  tea.       PHYSICAL EXAM  Vitals:   04/07/17 0833  BP: 124/68  Pulse: 71  Weight: 162 lb 3.2 oz (73.6 kg)  Height: 5\' 9"  (1.753 m)   Body mass index is 23.95 kg/m.  Generalized: Well developed, in no acute distress   Neurological examination  Mentation: Alert oriented to time, place, history taking. Follows all commands speech and language fluent Cranial nerve II-XII: Pupils were equal round reactive to light. Extraocular movements were full, visual field were full on confrontational test. Facial sensation and strength were normal. Uvula tongue midline.  Discomfort with turning head side to side and up and down.  Neck muscles are tight bilaterally Motor: The motor testing reveals 5 over 5 strength of all 4 extremities. Good symmetric motor tone is noted throughout.  Sensory: Sensory testing is intact to soft touch on all 4 extremities. No evidence of extinction is noted.  Coordination: Cerebellar testing reveals good finger-nose-finger and heel-to-shin bilaterally.  Gait and station: Gait is normal. Tandem gait is unsteady.  Romberg is negative. No drift is seen.  Reflexes: Deep tendon reflexes are symmetric and normal bilaterally.   DIAGNOSTIC DATA (LABS, IMAGING, TESTING) - I reviewed patient records, labs, notes, testing and imaging myself where available.  Lab Results  Component Value Date   WBC 4.9 03/30/2017   HGB 13.2 03/30/2017   HCT 39.9 03/30/2017   MCV 89 03/30/2017   PLT 284 03/30/2017      Component Value Date/Time   NA 139 03/30/2017 1032   NA 144 04/23/2013 1549   K 4.2 03/30/2017 1032   K 3.5 04/23/2013 1549   CL 100 03/30/2017 1032   CO2  26 03/30/2017 1032   CO2 25 04/23/2013 1549   GLUCOSE 92 03/30/2017 1032   GLUCOSE 94 12/01/2015 1626   GLUCOSE 97 04/23/2013 1549   BUN 11 03/30/2017 1032   BUN 20.1 04/23/2013 1549   CREATININE 0.51 (L) 03/30/2017 1032   CREATININE 0.7 04/23/2013 1549   CALCIUM 9.2 03/30/2017 1032   CALCIUM 10.3 04/23/2013  1549   PROT 6.5 03/30/2017 1032   PROT 6.8 04/23/2013 1549   ALBUMIN 4.3 03/30/2017 1032   ALBUMIN 4.1 04/23/2013 1549   AST 19 03/30/2017 1032   AST 31 04/23/2013 1549   ALT 16 03/30/2017 1032   ALT 32 04/23/2013 1549   ALKPHOS 79 03/30/2017 1032   ALKPHOS 74 04/23/2013 1549   BILITOT 0.6 03/30/2017 1032   BILITOT 0.53 04/23/2013 1549   GFRNONAA 94 03/30/2017 1032   GFRNONAA >89 07/10/2012 1056   GFRAA 108 03/30/2017 1032   GFRAA >89 07/10/2012 1056   Lab Results  Component Value Date   CHOL 227 (H) 03/30/2017   HDL 52 03/30/2017   LDLCALC 151 (H) 03/30/2017   LDLDIRECT 164 (H) 06/07/2014   TRIG 118 03/30/2017   CHOLHDL 4.4 03/30/2017   Lab Results  Component Value Date   HGBA1C 5.2 09/28/2013   Lab Results  Component Value Date   VITAMINB12 841 10/01/2015   Lab Results  Component Value Date   TSH 1.780 03/30/2017      ASSESSMENT AND PLAN 77 y.o. year old female  has a past medical history of Anal or rectal pain, Anxiety state, unspecified, Arthritis, Arthropathy, unspecified, site unspecified, Carpal tunnel syndrome, Cataract, Chronic airway obstruction, not elsewhere classified, Esophageal reflux, Esophageal stricture, Hypertension, Internal hemorrhoids without mention of complication, Malignant neoplasm of breast (female), unspecified site, Memory disorder (09/24/2013), Osteopenia, Other and unspecified hyperlipidemia, Pain in joint, multiple sites, Personal history of colonic polyps, Polyneuropathy in other diseases classified elsewhere ([place]) (09/24/2013), Shingles, Sleep apnea, and Unspecified hypothyroidism. here with:  1.  Trigeminal neuralgia 2.  Neck pain  The patient will continue on gabapentin 300 mg in the morning and 600 mg in the evening for trigeminal neuralgia.  I will refer the patient for MRI of the cervical spine to look for any acute causes of neck pain.  She is advised that if her symptoms worsen or she develops new symptoms she should let us know.   She will follow-up in 6 months or sooner if needed.     Ward Givens, MSN, NP-C 04/07/2017, 9:00 AM [facility] [address] [place], [place] [address] [phone]

## 2017-04-07 NOTE — Telephone Encounter (Signed)
Patient wanted Open MRI faxed order to [facility] they will contact the pt to schedule. [employer] medicare no auth .

## 2017-04-07 NOTE — Patient Instructions (Addendum)
Your Plan:  Continue gabapentin  MRI cervical spine If your symptoms worsen or you develop new symptoms please let us know.   Thank you for coming to see [place] at [facility]. I hope we have been able to provide you high quality care today.  You may receive a patient satisfaction survey over the next few weeks. We would appreciate your feedback and comments so that we may continue to improve ourselves and the health of our patients.

## 2017-04-12 NOTE — Telephone Encounter (Signed)
Patient is scheduled at [facility] for 04/18/17.

## 2017-04-18 ENCOUNTER — Telehealth: Payer: Self-pay | Admitting: *Deleted

## 2017-04-18 ENCOUNTER — Other Ambulatory Visit: Payer: Self-pay | Admitting: Physician Assistant

## 2017-04-18 DIAGNOSIS — M4722 Other spondylosis with radiculopathy, cervical region: Secondary | ICD-10-CM | POA: Diagnosis not present

## 2017-04-18 MED ORDER — ONDANSETRON 4 MG PO TBDP: 4.0000 mg | ORAL_TABLET | Freq: Three times a day (TID) | ORAL | 0 refills | Status: DC | PRN

## 2017-04-18 NOTE — Telephone Encounter (Signed)
Medication sent, family aware

## 2017-04-18 NOTE — Telephone Encounter (Signed)
Aware no food, to do clear liquids x 24 hours and meds called in

## 2017-04-25 DIAGNOSIS — Z961 Presence of intraocular lens: Secondary | ICD-10-CM | POA: Diagnosis not present

## 2017-04-25 DIAGNOSIS — H26493 Other secondary cataract, bilateral: Secondary | ICD-10-CM | POA: Diagnosis not present

## 2017-04-25 DIAGNOSIS — H35372 Puckering of macula, left eye: Secondary | ICD-10-CM | POA: Diagnosis not present

## 2017-05-04 DIAGNOSIS — C50912 Malignant neoplasm of unspecified site of left female breast: Secondary | ICD-10-CM | POA: Diagnosis not present

## 2017-05-11 ENCOUNTER — Telehealth: Payer: Self-pay | Admitting: Family Medicine

## 2017-05-11 NOTE — Telephone Encounter (Signed)
I do not see these results in her chart

## 2017-05-11 NOTE — Telephone Encounter (Signed)
Please contact Dr. Tobey Grim office for a copy of the MRI report and let patient know that we are trying to get this.  We will call her when it is received.

## 2017-05-12 ENCOUNTER — Telehealth: Payer: Self-pay | Admitting: Adult Health

## 2017-05-12 DIAGNOSIS — M542 Cervicalgia: Secondary | ICD-10-CM

## 2017-05-12 NOTE — Telephone Encounter (Signed)
Pt states she had MRI 04/21/17 @ [facility] and was told it was sent that to [facility]. She is requesting results, please call to advise

## 2017-05-13 NOTE — Telephone Encounter (Signed)
Pt aware that results are not in epic yet - only orders = we will call and try to get these.   Message Left with medical records at that office today

## 2017-05-13 NOTE — Telephone Encounter (Signed)
I called the patient and LVM 

## 2017-05-16 ENCOUNTER — Other Ambulatory Visit: Payer: Self-pay | Admitting: Family Medicine

## 2017-05-16 NOTE — Addendum Note (Signed)
Addended by: Trudie Buckler on: 05/16/2017 04:39 PM   Modules accepted: Orders

## 2017-05-16 NOTE — Telephone Encounter (Signed)
I called the patient and left a voicemail.  Advised that she should call our office.

## 2017-05-16 NOTE — Telephone Encounter (Signed)
Patient called back.  I reviewed her MRI results:  Cervical MRI with and without contrast:  1.  Moderate spondylosis C5-C6 and C6-C7 2.  Moderate foraminal stenosis bilaterally at C5-C6.  Mild foraminal stenosis on the right at C6-7 3.  Bilateral nerve root cyst C6-7, C7-T1, and T1-2  I discussed with Dr. Jannifer Franklin.  No surgical interventions at this time.  Recommended conservative treatment with physical therapy first.  The patient is amenable to this plan.

## 2017-05-19 ENCOUNTER — Encounter: Payer: Self-pay | Admitting: Physical Therapy

## 2017-05-19 ENCOUNTER — Ambulatory Visit: Payer: Medicare Other | Attending: Adult Health | Admitting: Physical Therapy

## 2017-05-19 DIAGNOSIS — G8929 Other chronic pain: Secondary | ICD-10-CM | POA: Diagnosis not present

## 2017-05-19 DIAGNOSIS — M25511 Pain in right shoulder: Secondary | ICD-10-CM | POA: Diagnosis not present

## 2017-05-19 DIAGNOSIS — M5412 Radiculopathy, cervical region: Secondary | ICD-10-CM | POA: Diagnosis not present

## 2017-05-19 DIAGNOSIS — M542 Cervicalgia: Secondary | ICD-10-CM

## 2017-05-19 NOTE — Therapy (Signed)
[facility] [place], [place], [address] Phone: [phone]   Fax:  [phone]  Physical Therapy Treatment  Patient Details  Name: [name] MRN: [id number] Date of Birth: 08/19/1940 Referring Provider: Ward Givens   Encounter Date: 05/19/2017  PT End of Session - 05/19/17 0955    Visit Number  1    Number of Visits  12    Date for PT Re-Evaluation  07/07/17    PT Start Time  0900    PT Stop Time  0942    PT Time Calculation (min)  42 min    Activity Tolerance  Patient tolerated treatment well    Behavior During Therapy  [facility] for tasks assessed/performed       Past Medical History:  Diagnosis Date  . Anal or rectal pain   . Anxiety state, unspecified   . Arthritis   . Arthropathy, unspecified, site unspecified   . Carpal tunnel syndrome    Bilateral  . Cataract   . Chronic airway obstruction, not elsewhere classified    patient denies on 02/27/14  . Esophageal reflux   . Esophageal stricture   . Hypertension   . Internal hemorrhoids without mention of complication   . Malignant neoplasm of breast (female), unspecified site    left breast   . Memory disorder 09/24/2013  . Osteopenia   . Other and unspecified hyperlipidemia   . Pain in joint, multiple sites   . Personal history of colonic polyps    adenomatous  . Polyneuropathy in other diseases classified elsewhere ([place]) 09/24/2013  . Shingles   . Sleep apnea    cpap   . Unspecified hypothyroidism     Past Surgical History:  Procedure Laterality Date  . APPENDECTOMY  03/2014  . BACK SURGERY     X2  . BREAST LUMPECTOMY    . EYE SURGERY    . LAPAROSCOPIC APPENDECTOMY N/A 02/28/2014   Procedure: APPENDECTOMY LAPAROSCOPIC PARTIAL CECECTOMY FOR MUCOCELE OF APPENDIX ;  Surgeon: Michael Boston, MD;  Location: [facility];  Service: General;  Laterality: N/A;  . MASTECTOMY     left  . THYROIDECTOMY    . TUBAL LIGATION      There were no vitals filed for  this visit.  Subjective Assessment - 05/19/17 1034    Subjective  Patient arrives to physical therapy with reports of neck and shoulder pain that gradually began since the winter. Patient also reports neck stiffness and loss of range of motion R>L. Patient reports some numbness and tingling in both UE with bilateral grip strength weakness. Patient reports difficulty with ADLs and fixing her hair. Patient reports pain is "moderate" after increased physical activities and requires OTC medication and heat to decrease pain.  Patient's goals are to decrease pain, improve movement, and improve strength.    Pertinent History  trigeminal neuralgia    Limitations  Lifting;House hold activities;Other (comment) ADLs    Patient Stated Goals  get rid of pain         [facility] PT Assessment - 05/19/17 0001      Assessment   Medical Diagnosis  Cervicalgia    Referring Provider  Megan Millikan    Onset Date/Surgical Date  -- ongoing    Hand Dominance  Right    Next MD Visit  3 months    Prior Therapy  No      Balance Screen   Has the patient fallen in the past 6 months  Yes  How many times?  1    Has the patient had a decrease in activity level because of a fear of falling?   No    Is the patient reluctant to leave their home because of a fear of falling?   No      Home Film/video editor residence    Living Arrangements  Alone      Prior Function   Level of Independence  Independent      ROM / Strength   AROM / PROM / Strength  AROM;Strength      AROM   Overall AROM Comments  right grip strength 15 lbs; left grip strength 15 lbs    AROM Assessment Site  Cervical;Shoulder    Right/Left Shoulder  Right;Left    Right Shoulder Flexion  115 Degrees    Right Shoulder ABduction  130 Degrees    Right Shoulder External Rotation  55 Degrees    Left Shoulder Flexion  122 Degrees    Left Shoulder ABduction  115 Degrees    Left Shoulder External Rotation  45 Degrees     Cervical Flexion  44    Cervical Extension  6    Cervical - Right Rotation  40    Cervical - Left Rotation  45      Strength   Overall Strength  Within functional limits for tasks performed    Strength Assessment Site  Shoulder;Cervical    Right/Left Shoulder  Right;Left    Right Shoulder Flexion  3+/5 (+) in anterior shoulder and collar bone    Right Shoulder ABduction  4/5    Right Shoulder Internal Rotation  4-/5    Right Shoulder External Rotation  4-/5    Left Shoulder Flexion  3+/5    Left Shoulder ABduction  4/5    Left Shoulder Internal Rotation  4-/5    Left Shoulder External Rotation  4-/5                                PT Long Term Goals - 05/19/17 1100      PT LONG TERM GOAL #1   Title  Patient wil be independent HEP.    Time  6    Period  Weeks    Status  New      PT LONG TERM GOAL #2   Title  Patient will improve bilateral cervical rotation to 60+ degrees allow for safe scanning of environment.    Time  6    Period  Weeks    Status  New      PT LONG TERM GOAL #3   Title  Patient will report ability to perform ADLs with less than 4/10 pain.    Time  6    Period  Weeks    Status  New      PT LONG TERM GOAL #4   Title  Patient will report decreased nuerological symptoms in bilateral UE to indicate decreasd nerve irritation.    Time  6    Period  Weeks    Status  New            Plan - 05/19/17 1114    Clinical Impression Statement  Patient is a 77 year old female who presents to physical therapy with decreased neck and shoulder AROM, pain, and decreased shoulder MMT with associated pain. Patient noted with normal sensation. Patient demonstrated forward head, rounded shoulders,  elevated right shoulder in comparison to left. Patient noted with tenderness to palpation to bilateral UT, levator scapula insertion, right clavicular head of the pectoralis major, and medial borders of the scapulae. Patient would benefit from skilled  physical therapy to decrease pain and address patient goals.     Clinical Presentation  Evolving    Clinical Decision Making  Low    Rehab Potential  Fair    PT Frequency  2x / week    PT Duration  6 weeks    PT Treatment/Interventions  ADLs/Self Care Home Management;Cryotherapy;Electrical Stimulation;Moist Heat;Iontophoresis 4mg /ml Dexamethasone;Ultrasound;Patient/family education;Passive range of motion;Manual techniques;Therapeutic exercise    PT Next Visit Plan  cervical ROM, isometrics, Modalities for pain relief.    PT Home Exercise Plan  chin tucks, scapular retractions, corner stretch    Consulted and Agree with Plan of Care  Patient       Patient will benefit from skilled therapeutic intervention in order to improve the following deficits and impairments:  Pain, Impaired UE functional use, Decreased range of motion, Decreased activity tolerance, Decreased strength, Postural dysfunction  Visit Diagnosis: Cervicalgia  Radiculopathy, cervical region  Chronic right shoulder pain     Problem List Patient Active Problem List   Diagnosis Date Noted  . Aortic atherosclerosis ([place]) 11/08/2016  . Thoracic aortic ectasia ([place]) 11/08/2016  . 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 Polyneuropathy in other diseases classified elsewhere ([place]) 09/24/2013  . Memory disorder 09/24/2013  . Sleep apnea 05/15/2013  . Hypothyroidism, postsurgical 05/15/2013  . HTN (hypertension) 05/15/2013  . Vitamin D deficiency 05/15/2013  . Hyperlipidemia 05/15/2013  . Osteopenia 10/25/2012  . Mild cognitive impairment with memory loss 09/27/2012  . Trigeminal neuralgia 04/02/2011  . Dysphagia 07/23/2010  . GERD (gastroesophageal reflux disease) 07/23/2010  . Right facial pain 07/23/2010  . ANXIETY 05/24/2008  . COPD 05/24/2008  . CONSTIPATION 05/24/2008  . ARTHRITIS 05/24/2008  . Idiopathic osteoporosis 05/24/2008  . Malignant neoplasm of upper-outer quadrant  of left female breast ([place]) 05/22/2008  . INTERNAL HEMORRHOIDS 05/22/2008  . COLONIC POLYPS, ADENOMATOUS, HX OF 05/22/2008    Gabriela Eves, PT, DPT 05/19/2017, 11:18 AM  [facility]  [address] [place], [place], [address] Phone: [phone]   Fax:  [phone]  Name: Mennie Spiller MRN: [id number] Date of Birth: August 31, 1940

## 2017-05-23 ENCOUNTER — Ambulatory Visit: Payer: Medicare Other | Admitting: Physical Therapy

## 2017-05-23 ENCOUNTER — Encounter: Payer: Self-pay | Admitting: Physical Therapy

## 2017-05-23 DIAGNOSIS — M542 Cervicalgia: Secondary | ICD-10-CM | POA: Diagnosis not present

## 2017-05-23 DIAGNOSIS — M25511 Pain in right shoulder: Secondary | ICD-10-CM | POA: Diagnosis not present

## 2017-05-23 DIAGNOSIS — M5412 Radiculopathy, cervical region: Secondary | ICD-10-CM

## 2017-05-23 DIAGNOSIS — G8929 Other chronic pain: Secondary | ICD-10-CM | POA: Diagnosis not present

## 2017-05-23 NOTE — Therapy (Addendum)
[facility] [place], [place], [address] Phone: [phone]   Fax:  [phone]  Physical Therapy Treatment/Discharge  Patient Details  Name: [name] MRN: [id number] Date of Birth: [birth year] Referring Provider: Ward Givens   Encounter Date: 05/23/2017  PT End of Session - 05/23/17 1304    Visit Number  2    Number of Visits  12    Date for PT Re-Evaluation  07/07/17    PT Start Time  1301    PT Stop Time  1332 2 units secondary to modalities    PT Time Calculation (min)  31 min    Activity Tolerance  Patient tolerated treatment well    Behavior During Therapy  [facility] for tasks assessed/performed       Past Medical History:  Diagnosis Date  . Anal or rectal pain   . Anxiety state, unspecified   . Arthritis   . Arthropathy, unspecified, site unspecified   . Carpal tunnel syndrome    Bilateral  . Cataract   . Chronic airway obstruction, not elsewhere classified    patient denies on 02/27/14  . Esophageal reflux   . Esophageal stricture   . Hypertension   . Internal hemorrhoids without mention of complication   . Malignant neoplasm of breast (female), unspecified site    left breast   . Memory disorder 09/24/2013  . Osteopenia   . Other and unspecified hyperlipidemia   . Pain in joint, multiple sites   . Personal history of colonic polyps    adenomatous  . Polyneuropathy in other diseases classified elsewhere ([place]) 09/24/2013  . Shingles   . Sleep apnea    cpap   . Unspecified hypothyroidism     Past Surgical History:  Procedure Laterality Date  . APPENDECTOMY  03/2014  . BACK SURGERY     X2  . BREAST LUMPECTOMY    . EYE SURGERY    . LAPAROSCOPIC APPENDECTOMY N/A 02/28/2014   Procedure: APPENDECTOMY LAPAROSCOPIC PARTIAL CECECTOMY FOR MUCOCELE OF APPENDIX ;  Surgeon: Michael Boston, MD;  Location: [facility];  Service: General;  Laterality: N/A;  . MASTECTOMY     left  . THYROIDECTOMY    . TUBAL  LIGATION      There were no vitals filed for this visit.  Subjective Assessment - 05/23/17 1303    Subjective  Reports that she has really been working her shoulder and neck and is sore. Reports that she thinks she can do more at home so she may not return to PT.    Pertinent History  trigeminal neuralgia    Limitations  Lifting;House hold activities;Other (comment)    Patient Stated Goals  get rid of pain    Currently in Pain?  Yes    Pain Score  -- No pain score provided by patient    Pain Location  Neck    Pain Descriptors / Indicators  Sore    Pain Type  Chronic pain    Pain Onset  More than a month ago         [facility] PT Assessment - 05/23/17 0001      Assessment   Medical Diagnosis  Cervicalgia    Hand Dominance  Right    Next MD Visit  3 months    Prior Therapy  No                   [facility] Adult PT Treatment/Exercise - 05/23/17 0001      Exercises  Exercises  Neck;Shoulder      Neck Exercises: Machines for Strengthening   UBE (Upper Arm Bike)  120 RPM x4 min      Neck Exercises: Seated   Cervical Isometrics  Right rotation;Left rotation;5 secs;10 reps    Cervical Rotation  Both;20 reps    Other Seated Exercise  Lateral rotation x20 reps      Shoulder Exercises: Seated   Retraction  Strengthening;Both;20 reps    Horizontal ABduction  Strengthening;Both;20 reps;Theraband    Theraband Level (Shoulder Horizontal ABduction)  Level 1 (Yellow)    External Rotation  Strengthening;Both;20 reps;Theraband    Theraband Level (Shoulder External Rotation)  Level 1 (Yellow)    Other Seated Exercises  scap shrugs x20 reps      Shoulder Exercises: Stretch   Corner Stretch  3 reps;30 seconds             PT Education - 05/23/17 1325    Education provided  Yes    Education Details  HEP- cervical ROM, horizontal abduction and ER with yellow theraband    Person(s) Educated  Patient    Methods  Explanation;Demonstration;Handout    Comprehension  Verbalized  understanding          PT Long Term Goals - 05/19/17 1100      PT LONG TERM GOAL #1   Title  Patient wil be independent HEP.    Time  6    Period  Weeks    Status  New      PT LONG TERM GOAL #2   Title  Patient will improve bilateral cervical rotation to 60+ degrees allow for safe scanning of environment.    Time  6    Period  Weeks    Status  New      PT LONG TERM GOAL #3   Title  Patient will report ability to perform ADLs with less than 4/10 pain.    Time  6    Period  Weeks    Status  New      PT LONG TERM GOAL #4   Title  Patient will report decreased nuerological symptoms in bilateral UE to indicate decreasd nerve irritation.    Time  6    Period  Weeks    Status  New            Plan - 05/23/17 1334    Clinical Impression Statement  Patient arrived to treatment well with reports of shoulder and cervical ROM. Patient very active in her daily life and interested in continuing exercises at home. Patient guided through postural and cervical ROM/strengthening exercises without complaints of increased pain. Patient more limited with cervical extension and L cervical rotation upon observation. Patient required intermittant VCs and demo to reinforce proper technique of exercises. Patient reports compliance with HEP from evaluation. Patiient provided an additional HEP with yellow theraband for cervical ROM and postural strengthening. One occurance of patient reporting LUE tingling following scapular squeezes and shrugs as well as cervical ROM exercises. No modalities completed during treatment due to symptoms.    Rehab Potential  Fair    PT Frequency  2x / week    PT Duration  6 weeks    PT Treatment/Interventions  ADLs/Self Care Home Management;Cryotherapy;Electrical Stimulation;Moist Heat;Iontophoresis 73m/ml Dexamethasone;Ultrasound;Patient/family education;Passive range of motion;Manual techniques;Therapeutic exercise    PT Next Visit Plan  Continue per patient  preference.    PT Home Exercise Plan  chin tucks, scapular retractions, corner stretch; cervical rotation and extension,  horizontal abd and ER with yellow theraband    Consulted and Agree with Plan of Care  Patient       Patient will benefit from skilled therapeutic intervention in order to improve the following deficits and impairments:  Pain, Impaired UE functional use, Decreased range of motion, Decreased activity tolerance, Decreased strength, Postural dysfunction  Visit Diagnosis: Cervicalgia  Radiculopathy, cervical region  Chronic right shoulder pain     Problem List Patient Active Problem List   Diagnosis Date Noted  . Aortic atherosclerosis ([place]) 11/08/2016  . Thoracic aortic ectasia ([place]) 11/08/2016  . 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 Polyneuropathy in other diseases classified elsewhere ([place]) 09/24/2013  . Memory disorder 09/24/2013  . Sleep apnea 05/15/2013  . Hypothyroidism, postsurgical 05/15/2013  . HTN (hypertension) 05/15/2013  . Vitamin D deficiency 05/15/2013  . Hyperlipidemia 05/15/2013  . Osteopenia 10/25/2012  . Mild cognitive impairment with memory loss 09/27/2012  . Trigeminal neuralgia 04/02/2011  . Dysphagia 07/23/2010  . GERD (gastroesophageal reflux disease) 07/23/2010  . Right facial pain 07/23/2010  . ANXIETY 05/24/2008  . COPD 05/24/2008  . CONSTIPATION 05/24/2008  . ARTHRITIS 05/24/2008  . Idiopathic osteoporosis 05/24/2008  . Malignant neoplasm of upper-outer quadrant of left female breast ([employer]) 05/22/2008  . INTERNAL HEMORRHOIDS 05/22/2008  . COLONIC POLYPS, ADENOMATOUS, HX OF 05/22/2008   PHYSICAL THERAPY DISCHARGE SUMMARY  Visits from Start of Care: 2  Current functional level related to goals / functional outcomes: See above   Remaining deficits: See above   Education / Equipment: HEP  Plan: Patient agrees to discharge.  Patient goals were not met. Patient is being discharged due  to the patient's request.  ?????       Standley Brooking, PTA 05/23/2017, 1:44 PM  [facility], [place], [address] Phone: [phone]   Fax:  [phone]  Name: Janasia Coverdale MRN: [id number] Date of Birth: [birth year]

## 2017-05-26 ENCOUNTER — Encounter: Payer: Medicare Other | Admitting: Physical Therapy

## 2017-06-01 ENCOUNTER — Other Ambulatory Visit: Payer: Self-pay | Admitting: Family Medicine

## 2017-06-01 MED ORDER — ESOMEPRAZOLE MAGNESIUM 20 MG PO CPDR: 20.0000 mg | DELAYED_RELEASE_CAPSULE | Freq: Every day | ORAL | 3 refills | Status: DC

## 2017-06-01 NOTE — Telephone Encounter (Signed)
nexium  - been using OTC - cheaper to get rx

## 2017-06-10 ENCOUNTER — Telehealth: Payer: Self-pay | Admitting: *Deleted

## 2017-06-10 NOTE — Telephone Encounter (Signed)
Fax received [place]  Esomeprazole mag dr 20 mg is not on pt's formulary Please advise or call [phone] for [place]

## 2017-07-08 ENCOUNTER — Other Ambulatory Visit: Payer: Self-pay | Admitting: Neurology

## 2017-08-11 ENCOUNTER — Encounter: Payer: Self-pay | Admitting: Family Medicine

## 2017-08-11 ENCOUNTER — Ambulatory Visit (INDEPENDENT_AMBULATORY_CARE_PROVIDER_SITE_OTHER): Payer: Medicare Other

## 2017-08-11 ENCOUNTER — Ambulatory Visit (INDEPENDENT_AMBULATORY_CARE_PROVIDER_SITE_OTHER): Payer: Medicare Other | Admitting: Family Medicine

## 2017-08-11 VITALS — BP 103/62 | HR 61 | Temp 97.4°F | Ht 69.0 in | Wt 156.0 lb

## 2017-08-11 DIAGNOSIS — Z780 Asymptomatic menopausal state: Secondary | ICD-10-CM

## 2017-08-11 DIAGNOSIS — E538 Deficiency of other specified B group vitamins: Secondary | ICD-10-CM

## 2017-08-11 DIAGNOSIS — I77810 Thoracic aortic ectasia: Secondary | ICD-10-CM

## 2017-08-11 DIAGNOSIS — E559 Vitamin D deficiency, unspecified: Secondary | ICD-10-CM

## 2017-08-11 DIAGNOSIS — I700 Atherosclerosis of aorta: Secondary | ICD-10-CM

## 2017-08-11 DIAGNOSIS — M810 Age-related osteoporosis without current pathological fracture: Secondary | ICD-10-CM | POA: Diagnosis not present

## 2017-08-11 DIAGNOSIS — I10 Essential (primary) hypertension: Secondary | ICD-10-CM | POA: Diagnosis not present

## 2017-08-11 DIAGNOSIS — C50412 Malignant neoplasm of upper-outer quadrant of left female breast: Secondary | ICD-10-CM

## 2017-08-11 DIAGNOSIS — E890 Postprocedural hypothyroidism: Secondary | ICD-10-CM

## 2017-08-11 DIAGNOSIS — K219 Gastro-esophageal reflux disease without esophagitis: Secondary | ICD-10-CM

## 2017-08-11 DIAGNOSIS — E7800 Pure hypercholesterolemia, unspecified: Secondary | ICD-10-CM

## 2017-08-11 DIAGNOSIS — Z13820 Encounter for screening for osteoporosis: Secondary | ICD-10-CM

## 2017-08-11 DIAGNOSIS — G3184 Mild cognitive impairment, so stated: Secondary | ICD-10-CM

## 2017-08-11 DIAGNOSIS — G4733 Obstructive sleep apnea (adult) (pediatric): Secondary | ICD-10-CM

## 2017-08-11 DIAGNOSIS — G500 Trigeminal neuralgia: Secondary | ICD-10-CM

## 2017-08-11 NOTE — Patient Instructions (Addendum)
Medicare Annual Wellness Visit  [facility] and the medical providers at [facility] strive to bring you the best medical care.  In doing so we not only want to address your current medical conditions and concerns but also to detect new conditions early and prevent illness, disease and health-related problems.    Medicare offers a yearly Wellness Visit which allows our clinical staff to assess your need for preventative services including immunizations, lifestyle education, counseling to decrease risk of preventable diseases and screening for fall risk and other medical concerns.    This visit is provided free of charge (no copay) for all Medicare recipients. The clinical pharmacists at [facility] have begun to conduct these Wellness Visits which will also include a thorough review of all your medications.    As you primary medical provider recommend that you make an appointment for your Annual Wellness Visit if you have not done so already this year.  You may set up this appointment before you leave today or you may call back ([phone]) and schedule an appointment.  Please make sure when you call that you mention that you are scheduling your Annual Wellness Visit with the clinical pharmacist so that the appointment may be made for the proper length of time.     Continue current medications. Continue good therapeutic lifestyle changes which include good diet and exercise. Fall precautions discussed with patient. If an FOBT was given today- please return it to our front desk. If you are over 66 years old - you may need Prevnar 23 or the adult Pneumonia vaccine.  **Flu shots are available--- please call and schedule a FLU-CLINIC appointment**  After your visit with [place] today you will receive a survey in the mail or online from [employer] regarding your care with [place]. Please take a moment to fill this out. Your feedback is very  important to [place] as you can help [place] better understand your patient needs as well as improve your experience and satisfaction. WE CARE ABOUT YOU!!!   Hold the Nexium and try Zantac or ranitidine which is the generic from [facility], the equate brand and take 1 of these twice daily before breakfast and supper.  This will be 150 mg.  If this does not work to control the reflux and heartburn we will have to go back on a proton pump inhibitor and you should call us in the next 4 to 6 weeks and we can call a less expensive version of the proton pump inhibitor in for you. Follow-up with neurology as planned Discuss with him if there is a need for additional medicine for your memory impairment like Namenda. This summer drink plenty of fluids and stay well-hydrated.

## 2017-08-11 NOTE — Progress Notes (Signed)
Subjective:    Patient ID: [name], female    DOB: 20-Jan-1940, 77 y.o.   MRN: [id number]  HPI Pt here for follow up and management of chronic medical problems which includes hypertension and hyperlipidemia. She is taking medication regularly.  The patient does have reflux issues and says the Nexium 20 is too expensive.  We will have her try omeprazole 20.  She is due to get a DEXA scan.  She will get lab work today.  She had a CT scan of the chest in November 2018.  This did show aortic atherosclerosis with left mastectomy and no explanation for her left chest wall pain.  The patient is doing well overall.  She does complain of the expense of the proton pump inhibitors.  We also talked about some of the side effects and she is willing to try an H2 blocker instead and understands that if the H2 blocker does not control her reflux symptoms that she will try a different proton pump inhibitor like omeprazole.  She is followed by the neurologist for her memory impairment which she says is mild.  She will discuss with them at the next visit if they need to add something else to the Aricept.  The patient today denies any chest pain pressure tightness or shortness of breath.  As long she is taking the Nexium she does not have any reflux but does have some discomfort in her epigastric area.  She denies any blood in the stool black tarry bowel movements or change in bowel habits.  She is passing her water well other than some incontinence at times.    Patient Active Problem List   Diagnosis Date Noted  . Aortic atherosclerosis ([place]) 11/08/2016  . Thoracic aortic ectasia ([place]) 11/08/2016  . 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 Polyneuropathy in other diseases classified elsewhere ([place]) 09/24/2013  . Memory disorder 09/24/2013  . Sleep apnea 05/15/2013  . Hypothyroidism, postsurgical 05/15/2013  . HTN (hypertension) 05/15/2013  . Vitamin D deficiency 05/15/2013  .  Hyperlipidemia 05/15/2013  . Osteopenia 10/25/2012  . Mild cognitive impairment with memory loss 09/27/2012  . Trigeminal neuralgia 04/02/2011  . Dysphagia 07/23/2010  . GERD (gastroesophageal reflux disease) 07/23/2010  . Right facial pain 07/23/2010  . ANXIETY 05/24/2008  . COPD 05/24/2008  . CONSTIPATION 05/24/2008  . ARTHRITIS 05/24/2008  . Idiopathic osteoporosis 05/24/2008  . Malignant neoplasm of upper-outer quadrant of left female breast ([place]) 05/22/2008  . INTERNAL HEMORRHOIDS 05/22/2008  . COLONIC POLYPS, ADENOMATOUS, HX OF 05/22/2008   Outpatient Encounter Medications as of 08/11/2017  Medication Sig  . Apoaequorin (PREVAGEN) 10 MG CAPS Take 10 mg by mouth every evening.   Marland Kitchen aspirin 81 MG tablet Take 81 mg by mouth 3 (three) times a week.   . busPIRone (BUSPAR) 30 MG tablet TAKE (1/2) TO (1) TABLET BY MOUTH ONCE DAILY AS NEEDED  . Calcium Carbonate-Vitamin D (CALCIUM 500/D) 500-125 MG-UNIT TABS Take 1 capsule by mouth daily.    . clobetasol (TEMOVATE) 0.05 % external solution Apply 1 application topically 2 (two) times a week.  . Cyanocobalamin (VITAMIN B 12 PO) Take 1 tablet by mouth daily.   Marland Kitchen donepezil (ARICEPT) 10 MG tablet Take 1 tablet (10 mg total) by mouth at bedtime.  Marland Kitchen esomeprazole (NEXIUM) 20 MG capsule Take 1 capsule (20 mg total) by mouth daily.  Marland Kitchen ezetimibe (ZETIA) 10 MG tablet TAKE 1 TABLET BY MOUTH DAILY  . gabapentin (NEURONTIN) 300 MG  capsule One capsule in the morning and 2 at night  . HYDROcodone-acetaminophen (NORCO/VICODIN) 5-325 MG tablet Take 1-2 tablets by mouth every 6 (six) hours as needed.  Marland Kitchen ibuprofen (ADVIL,MOTRIN) 200 MG tablet Take 800 mg by mouth every 6 (six) hours as needed (Pain).   Marland Kitchen levothyroxine (SYNTHROID, LEVOTHROID) 112 MCG tablet TAKE 1 TABLET BY MOUTH DAILY BEFORE BREAKFAST.  Marland Kitchen lisinopril-hydrochlorothiazide (PRINZIDE,ZESTORETIC) 10-12.5 MG tablet TAKE (1) TABLET BY MOUTH DAILY  . Multiple Vitamin (MULTIVITAMIN) capsule Take 1  capsule by mouth daily.    . nortriptyline (PAMELOR) 25 MG capsule Take 1 capsule (25 mg total) by mouth at bedtime.  . Omega-3 Fatty Acids (FISH OIL) 1000 MG CAPS Take 1 capsule by mouth daily.    . ondansetron (ZOFRAN ODT) 4 MG disintegrating tablet Take 1 tablet (4 mg total) by mouth every 8 (eight) hours as needed for nausea or vomiting.  . senna-docusate (SENOKOT S) 8.6-50 MG per tablet Take 1 tablet by mouth daily as needed for constipation.  . [DISCONTINUED] valACYclovir (VALTREX) 1000 MG tablet Take 1 tablet (1,000 mg total) by mouth 3 (three) times daily.   No facility-administered encounter medications on file as of 08/11/2017.       Review of Systems  Constitutional: Negative.   HENT: Negative.   Eyes: Negative.   Respiratory: Negative.   Cardiovascular: Negative.   Gastrointestinal: Negative.   Endocrine: Negative.   Genitourinary: Negative.   Musculoskeletal: Negative.   Skin: Negative.   Allergic/Immunologic: Negative.   Neurological: Negative.   Hematological: Negative.   Psychiatric/Behavioral: Negative.        Objective:   Physical Exam  Constitutional: She is oriented to person, place, and time. She appears well-developed and well-nourished. No distress.  Patient is pleasant and doing well and calm.  HENT:  Head: Normocephalic and atraumatic.  Right Ear: External ear normal.  Left Ear: External ear normal.  Nose: Nose normal.  Mouth/Throat: Oropharynx is clear and moist. No oropharyngeal exudate.  Eyes: Pupils are equal, round, and reactive to light. Conjunctivae and EOM are normal. Right eye exhibits no discharge. Left eye exhibits no discharge. No scleral icterus.  Recent eye exam with [facility]  Neck: Normal range of motion. Neck supple. No thyromegaly present.  Thyromegaly or anterior cervical adenopathy  Cardiovascular: Normal rate, regular rhythm, normal heart sounds and intact distal pulses.  No murmur heard. The heart is regular at 72/min.   Distal pulses were intact.  Pulmonary/Chest: Effort normal and breath sounds normal. No respiratory distress. She has no wheezes. She has no rales.  Clear anteriorly and posteriorly  Abdominal: Soft. Bowel sounds are normal. She exhibits no mass. There is tenderness. There is no rebound and no guarding.  Slight epigastric tenderness without masses organ enlargement or bruits.  No inguinal adenopathy.  Musculoskeletal: Normal range of motion. She exhibits no edema.  Lymphadenopathy:    She has no cervical adenopathy.  Neurological: She is alert and oriented to person, place, and time. She has normal reflexes. No cranial nerve deficit.  Skin: Skin is warm and dry. Rash noted. There is erythema. No pallor.  Skin is clear with no signs of any lesions other than some small papules from insect bites that she was not aware that she had received.  Psychiatric: She has a normal mood and affect. Her behavior is normal. Judgment and thought content normal.  Mood affect and behavior are good and normal for this patient.  Nursing note and vitals reviewed.  BP 103/62 (  BP Location: Right Arm)   Pulse 61   Temp (!) 97.4 F (36.3 C) (Oral)   Ht '5\' 9"'  (1.753 m)   Wt 156 lb (70.8 kg)   BMI 23.04 kg/m         Assessment & Plan:  1. Essential hypertension -Blood pressure is good today and she will continue with current treatment pending results of lab work - BMP8+EGFR - Hepatic function panel - CBC with Differential/Platelet  2. Pure hypercholesterolemia -Continue with current treatment pending results of lab work - Lipid panel - CBC with Differential/Platelet  3. Vitamin D deficiency -Continue with current treatment pending results of lab work - VITAMIN D 25 Hydroxy (Vit-D Deficiency, Fractures) - CBC with Differential/Platelet - DG [facility] DEXA; Future  4. Gastroesophageal reflux disease, esophagitis presence not specified -Patient has concerns about the cost of Nexium and she also has  concerns about her mild memory impairment and will try some Zantac 150 mg or ranitidine twice daily before breakfast and supper  and will hold off of the Nexium for the time being. - Hepatic function panel - CBC with Differential/Platelet  5. Hypothyroidism, postsurgical -Continue current treatment pending results of lab work - Thyroid Panel With TSH - CBC with Differential/Platelet  6. B12 deficiency -Check B12 level because of fatigue issues. - CBC with Differential/Platelet - Vitamin B12  7. Trigeminal neuralgia -This seems to be stable she will continue to follow-up with the neurologist - CBC with Differential/Platelet  8. Mild cognitive impairment with memory loss -Continue to follow-up with neurology and discuss whether she would benefit with adding Namenda to her current treatment - CBC with Differential/Platelet  9. Aortic atherosclerosis ([place]) -Continue aggressive therapeutic lifestyle changes and Zetia pending results of lab work - Lipid panel - CBC with Differential/Platelet  10. Malignant neoplasm of upper-outer quadrant of left female breast, unspecified estrogen receptor status ([place]) -Get mammograms as planned and follow-up with surgery as needed - CBC with Differential/Platelet  11. Obstructive sleep apnea syndrome -No complaints with breathing issues or CPAP today. - CBC with Differential/Platelet  12. Thoracic aortic ectasia ([facility]) -Continue aggressive therapeutic lifestyle changes to control aortic atherosclerosis. - Lipid panel - CBC with Differential/Platelet  13. Screening for osteoporosis - DG [facility] DEXA; Future  14. Postmenopausal - DG [facility] DEXA; Future    Patient Instructions                       Medicare Annual Wellness Visit  [facility] and the medical providers at [facility] strive to bring you the best medical care.  In doing so we not only want to address your current medical conditions and concerns but also to  detect new conditions early and prevent illness, disease and health-related problems.    Medicare offers a yearly Wellness Visit which allows our clinical staff to assess your need for preventative services including immunizations, lifestyle education, counseling to decrease risk of preventable diseases and screening for fall risk and other medical concerns.    This visit is provided free of charge (no copay) for all Medicare recipients. The clinical pharmacists at [facility] have begun to conduct these Wellness Visits which will also include a thorough review of all your medications.    As you primary medical provider recommend that you make an appointment for your Annual Wellness Visit if you have not done so already this year.  You may set up this appointment before you leave today or you  may call back ([phone]) and schedule an appointment.  Please make sure when you call that you mention that you are scheduling your Annual Wellness Visit with the clinical pharmacist so that the appointment may be made for the proper length of time.     Continue current medications. Continue good therapeutic lifestyle changes which include good diet and exercise. Fall precautions discussed with patient. If an FOBT was given today- please return it to our front desk. If you are over 7 years old - you may need Prevnar 22 or the adult Pneumonia vaccine.  **Flu shots are available--- please call and schedule a FLU-CLINIC appointment**  After your visit with [place] today you will receive a survey in the mail or online from [employer] regarding your care with [place]. Please take a moment to fill this out. Your feedback is very important to [place] as you can help [place] better understand your patient needs as well as improve your experience and satisfaction. WE CARE ABOUT YOU!!!   Hold the Nexium and try Zantac or ranitidine which is the generic from [facility], the equate brand and take 1 of these twice daily  before breakfast and supper.  This will be 150 mg.  If this does not work to control the reflux and heartburn we will have to go back on a proton pump inhibitor and you should call us in the next 4 to 6 weeks and we can call a less expensive version of the proton pump inhibitor in for you. Follow-up with neurology as planned Discuss with him if there is a need for additional medicine for your memory impairment like Namenda. This summer drink plenty of fluids and stay well-hydrated.     Arrie Senate MD

## 2017-08-12 LAB — CBC WITH DIFFERENTIAL/PLATELET
Basophils Absolute: 0 10*3/uL (ref 0.0–0.2)
Basos: 1 %
EOS (ABSOLUTE): 0.3 10*3/uL (ref 0.0–0.4)
EOS: 6 %
HEMATOCRIT: 36.8 % (ref 34.0–46.6)
Hemoglobin: 12.5 g/dL (ref 11.1–15.9)
IMMATURE GRANULOCYTES: 0 %
Immature Grans (Abs): 0 10*3/uL (ref 0.0–0.1)
Lymphocytes Absolute: 1.8 10*3/uL (ref 0.7–3.1)
Lymphs: 34 %
MCH: 30 pg (ref 26.6–33.0)
MCHC: 34 g/dL (ref 31.5–35.7)
MCV: 89 fL (ref 79–97)
Monocytes Absolute: 0.4 10*3/uL (ref 0.1–0.9)
Monocytes: 7 %
NEUTROS ABS: 2.8 10*3/uL (ref 1.4–7.0)
Neutrophils: 52 %
PLATELETS: 228 10*3/uL (ref 150–450)
RBC: 4.16 x10E6/uL (ref 3.77–5.28)
RDW: 13.1 % (ref 12.3–15.4)
WBC: 5.4 10*3/uL (ref 3.4–10.8)

## 2017-08-12 LAB — VITAMIN B12: Vitamin B-12: 386 pg/mL (ref 232–1245)

## 2017-08-12 LAB — LIPID PANEL
CHOL/HDL RATIO: 4.8 ratio — AB (ref 0.0–4.4)
Cholesterol, Total: 232 mg/dL — ABNORMAL HIGH (ref 100–199)
HDL: 48 mg/dL (ref >39)
LDL CALC: 160 mg/dL — AB (ref 0–99)
TRIGLYCERIDES: 119 mg/dL (ref 0–149)
VLDL CHOLESTEROL CAL: 24 mg/dL (ref 5–40)

## 2017-08-12 LAB — THYROID PANEL WITH TSH
Free Thyroxine Index: 2.1 (ref 1.2–4.9)
T3 Uptake Ratio: 26 % (ref 24–39)
T4, Total: 8.1 ug/dL (ref 4.5–12.0)
TSH: 1.94 u[IU]/mL (ref 0.450–4.500)

## 2017-08-12 LAB — BMP8+EGFR
BUN/Creatinine Ratio: 31 — ABNORMAL HIGH (ref 12–28)
BUN: 18 mg/dL (ref 8–27)
CO2: 25 mmol/L (ref 20–29)
CREATININE: 0.59 mg/dL (ref 0.57–1.00)
Calcium: 9.7 mg/dL (ref 8.7–10.3)
Chloride: 103 mmol/L (ref 96–106)
GFR, EST AFRICAN AMERICAN: 103 mL/min/{1.73_m2} (ref >59)
GFR, EST NON AFRICAN AMERICAN: 89 mL/min/{1.73_m2} (ref >59)
Glucose: 99 mg/dL (ref 65–99)
Potassium: 4.2 mmol/L (ref 3.5–5.2)
Sodium: 143 mmol/L (ref 134–144)

## 2017-08-12 LAB — HEPATIC FUNCTION PANEL
ALK PHOS: 65 IU/L (ref 39–117)
ALT: 24 IU/L (ref 0–32)
AST: 24 IU/L (ref 0–40)
Albumin: 4.7 g/dL (ref 3.5–4.8)
Bilirubin Total: 0.7 mg/dL (ref 0.0–1.2)
Bilirubin, Direct: 0.21 mg/dL (ref 0.00–0.40)
Total Protein: 6.3 g/dL (ref 6.0–8.5)

## 2017-08-12 LAB — VITAMIN D 25 HYDROXY (VIT D DEFICIENCY, FRACTURES): Vit D, 25-Hydroxy: 43 ng/mL (ref 30.0–100.0)

## 2017-08-17 ENCOUNTER — Other Ambulatory Visit: Payer: Self-pay | Admitting: Family Medicine

## 2017-08-22 DIAGNOSIS — R0600 Dyspnea, unspecified: Secondary | ICD-10-CM | POA: Diagnosis not present

## 2017-08-22 DIAGNOSIS — I10 Essential (primary) hypertension: Secondary | ICD-10-CM | POA: Diagnosis not present

## 2017-08-22 DIAGNOSIS — R202 Paresthesia of skin: Secondary | ICD-10-CM | POA: Diagnosis not present

## 2017-08-22 DIAGNOSIS — R1111 Vomiting without nausea: Secondary | ICD-10-CM | POA: Diagnosis not present

## 2017-08-22 DIAGNOSIS — R531 Weakness: Secondary | ICD-10-CM | POA: Diagnosis not present

## 2017-08-24 ENCOUNTER — Telehealth: Payer: Self-pay | Admitting: Family Medicine

## 2017-08-24 DIAGNOSIS — R51 Headache: Principal | ICD-10-CM

## 2017-08-24 DIAGNOSIS — R519 Headache, unspecified: Secondary | ICD-10-CM

## 2017-08-24 DIAGNOSIS — G500 Trigeminal neuralgia: Secondary | ICD-10-CM

## 2017-08-24 NOTE — Telephone Encounter (Signed)
Trigeminal neuralgia - having issues she thinks:  Weakness in back, lower extremity weakness Earache all the time, right ear pain, recent toothache. HA - everyday (ALWAYS RIGHT SIDE)   Monday - went to lunch, went to a store- got very weak all of a sudden. Sat down, got worse, almost passed out (felt like she needed to eat) Hands and feet was drawing in and she got SOB, passed out - called EMS - went to [facility] = test were done  Heart was normal  Low potassium - gave RX  10 Meq daily   SHOULD SHE HAVE A SCAN? HEAD/ EAR  She and daughter are very worried.

## 2017-08-25 NOTE — Telephone Encounter (Signed)
Schedule CT scan because of syncopal episode and headache

## 2017-08-25 NOTE — Telephone Encounter (Signed)
Sounds like dehydration to me.  Did she hit her head.??  She really needs to be seen by someone or she should go to the emergency room or go back and see the neurologist, if she does not want to come here.  She may need to have a head CT but should be evaluated first possible.  She should drink plenty of fluids to rehydrate herself and have her BMP repeated in 1 week to make sure the potassium is better.  Someone needs to look at the ear and the patient neurologically.  See if you can get her in to see Dr. Jannifer Franklin.  She has seen him before.

## 2017-08-25 NOTE — Telephone Encounter (Signed)
Pt would like a scan.  Can we do a CT and then repeat labs in 1 week?

## 2017-08-26 ENCOUNTER — Ambulatory Visit
Admission: RE | Admit: 2017-08-26 | Discharge: 2017-08-26 | Disposition: A | Discharge status: Home / Self Care | Payer: Medicare Other | Source: Ambulatory Visit | Attending: Family Medicine | Admitting: Family Medicine

## 2017-08-26 DIAGNOSIS — R51 Headache: Principal | ICD-10-CM

## 2017-08-26 DIAGNOSIS — G500 Trigeminal neuralgia: Secondary | ICD-10-CM

## 2017-08-26 DIAGNOSIS — R519 Headache, unspecified: Secondary | ICD-10-CM

## 2017-08-26 NOTE — Telephone Encounter (Signed)
Pt called = aware of order for stat CT

## 2017-08-26 NOTE — Telephone Encounter (Signed)
LM for pt to call me with time and date that she can go for CT

## 2017-08-29 ENCOUNTER — Telehealth: Payer: Self-pay | Admitting: *Deleted

## 2017-08-29 MED ORDER — ALENDRONATE SODIUM 70 MG PO TABS: 70.0000 mg | ORAL_TABLET | ORAL | 11 refills | Status: DC

## 2017-08-29 NOTE — Telephone Encounter (Signed)
Pt has not previously taken any meds for osteoporosis Ins will not cover Prolia until pt has tried and failed 2 different meds Per Dr Laurance Flatten, Fosamax sent into pharmacy

## 2017-08-29 NOTE — Telephone Encounter (Signed)
-----   Message from Chipper Herb, MD sent at 08/16/2017  5:55 PM EDT ----- As per radiology report This patient has osteoporosis based on a T score of a -2.7 of the lumbar spine.  She is considered osteoporotic according to the [employer] criteria. She should be taking calcium 600 mg twice daily and 1000 units of vitamin D3 daily She should practice weightbearing exercise and fall prevention She should use night lights in the house and remove all scatter rugs. He is postmenopausal and has osteoporosis. My recommendation would be to start her on Prolia injections every 6 months.  If her insurance will not cover this then we can do a bisphosphonate.  Like Fosamax or Actonel.

## 2017-08-30 ENCOUNTER — Ambulatory Visit (INDEPENDENT_AMBULATORY_CARE_PROVIDER_SITE_OTHER): Payer: Medicare Other | Admitting: Family Medicine

## 2017-08-30 ENCOUNTER — Encounter: Payer: Self-pay | Admitting: Family Medicine

## 2017-08-30 VITALS — BP 147/63 | HR 54 | Temp 96.9°F | Ht 69.0 in | Wt 161.0 lb

## 2017-08-30 DIAGNOSIS — E876 Hypokalemia: Secondary | ICD-10-CM | POA: Diagnosis not present

## 2017-08-30 DIAGNOSIS — R002 Palpitations: Secondary | ICD-10-CM

## 2017-08-30 DIAGNOSIS — R531 Weakness: Secondary | ICD-10-CM | POA: Diagnosis not present

## 2017-08-30 DIAGNOSIS — G500 Trigeminal neuralgia: Secondary | ICD-10-CM

## 2017-08-30 DIAGNOSIS — G44039 Episodic paroxysmal hemicrania, not intractable: Secondary | ICD-10-CM | POA: Diagnosis not present

## 2017-08-30 LAB — URINALYSIS, COMPLETE
Bilirubin, UA: NEGATIVE
GLUCOSE, UA: NEGATIVE
Ketones, UA: NEGATIVE
Leukocytes, UA: NEGATIVE
NITRITE UA: NEGATIVE
Protein, UA: NEGATIVE
RBC, UA: NEGATIVE
Specific Gravity, UA: 1.01 (ref 1.005–1.030)
UUROB: 0.2 mg/dL (ref 0.2–1.0)
pH, UA: 6.5 (ref 5.0–7.5)

## 2017-08-30 LAB — MICROSCOPIC EXAMINATION
Bacteria, UA: NONE SEEN
Renal Epithel, UA: NONE SEEN /hpf

## 2017-08-30 NOTE — Addendum Note (Signed)
Addended by: Zannie Cove on: 08/30/2017 02:46 PM   Modules accepted: Orders

## 2017-08-30 NOTE — Patient Instructions (Addendum)
We will get a BMP today to recheck the potassium Since she think the potassium is making you nauseated we will ask you to hold the potassium and not take anymore and repeat the BMP in 2 to 3 weeks. We will hook you up with an event monitor to see if this is a cardiac issue We will schedule you for a visit with a cardiologist We will schedule you for a visit with a neurologist You will check your pulse when you feel the palpitations and see if it is irregular or regular. You continue to drink plenty of water and fluids Patient here and take a copy of blood work with you when you go visit with the specialist.  Dr. Percival Spanish can see this on the computer and so can Dr. Jannifer Franklin.

## 2017-08-30 NOTE — Progress Notes (Addendum)
Subjective:    Patient ID: [name], female    DOB: December 22, 1940, 77 y.o.   MRN: [id number]  HPI  Patient here today for follow up on HA, near syncope and weakness that started about a week ago.  This patient had a syncopal spell and ended up in the hospital in [facility].  She was found to have a low potassium.  The family called and were very concerned because of headache following the syncopal spell and hitting her head and we went ahead and got a CT scan which basically showed mild chronic ischemic white matter disease and no acute intracranial abnormality was seen.  Her potassium at the time was checked and was low when she is been taking 10 mEq of potassium daily but feels like this is making her have an upset stomach and feeling nauseated.  Her previous blood work was done and August on the eighth day.  All of this blood work including potassium are good at that time.  Her creatinine was good.  The LDL-C cholesterol was elevated at 160 triglycerides are good.  The vitamin D level was good at 43.  All liver function test and thyroid test were normal.  CBC was within normal limits with a good hemoglobin at 12.5 good white count at 5.4 and adequate platelet count.  The B12 level was done at that time also and was good at 386.  On closer questioning, patient says she has had these weak spells for about 3 months and that they come on very suddenly they have been getting progressively more frequent and they may last as long as 2 hours.  She had one just part before before coming to the office today.  Started at [employer].  She does indicate that she has been taking antibiotics for an abscess tooth and she feels like things have been worse since she is been taking the antibiotic.  She says she also has palpitations associated with a weakness and sometimes some nausea.  The weakness is total body weakness.  We reviewed her last web lab work from the office and all of that was good.  We also reviewed  the CT scan that she had done and just showed some chronic ischemic white matter disease.  We explained to the patient how she could check her pulse and check it for irregularity but we will most likely put an event monitor on her today get the BMP schedule her for a visit with a cardiologist and a follow-up visit with a neurologist.  Says she has a headache on the right side of her head a lot of times with the spells.  She also complains of some right ear discomfort pain.    Patient Active Problem List   Diagnosis Date Noted  . Aortic atherosclerosis ([place]) 11/08/2016  . Thoracic aortic ectasia ([place]) 11/08/2016  . 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 Polyneuropathy in other diseases classified elsewhere ([place]) 09/24/2013  . Memory disorder 09/24/2013  . Sleep apnea 05/15/2013  . Hypothyroidism, postsurgical 05/15/2013  . HTN (hypertension) 05/15/2013  . Vitamin D deficiency 05/15/2013  . Hyperlipidemia 05/15/2013  . Osteopenia 10/25/2012  . Mild cognitive impairment with memory loss 09/27/2012  . Trigeminal neuralgia 04/02/2011  . Dysphagia 07/23/2010  . GERD (gastroesophageal reflux disease) 07/23/2010  . Right facial pain 07/23/2010  . ANXIETY 05/24/2008  . COPD 05/24/2008  . CONSTIPATION 05/24/2008  . ARTHRITIS 05/24/2008  . Idiopathic osteoporosis 05/24/2008  . Malignant  neoplasm of upper-outer quadrant of left female breast ([place]) 05/22/2008  . INTERNAL HEMORRHOIDS 05/22/2008  . COLONIC POLYPS, ADENOMATOUS, HX OF 05/22/2008   Outpatient Encounter Medications as of 08/30/2017  Medication Sig  . alendronate (FOSAMAX) 70 MG tablet Take 1 tablet (70 mg total) by mouth every 7 (seven) days. Take with a full glass of water on an empty stomach.  . Apoaequorin (PREVAGEN) 10 MG CAPS Take 10 mg by mouth every evening.   Marland Kitchen aspirin 81 MG tablet Take 81 mg by mouth 3 (three) times a week.   . busPIRone (BUSPAR) 30 MG tablet TAKE (1/2) TO (1) TABLET BY  MOUTH ONCE DAILY AS NEEDED  . Calcium Carbonate-Vitamin D (CALCIUM 500/D) 500-125 MG-UNIT TABS Take 1 capsule by mouth daily.    . clobetasol (TEMOVATE) 0.05 % external solution Apply 1 application topically 2 (two) times a week.  . Cyanocobalamin (VITAMIN B 12 PO) Take 1 tablet by mouth daily.   Marland Kitchen donepezil (ARICEPT) 10 MG tablet Take 1 tablet (10 mg total) by mouth at bedtime.  Marland Kitchen esomeprazole (NEXIUM) 20 MG capsule Take 1 capsule (20 mg total) by mouth daily.  Marland Kitchen ezetimibe (ZETIA) 10 MG tablet TAKE 1 TAB BY MOUTH DAILY  . gabapentin (NEURONTIN) 300 MG capsule One capsule in the morning and 2 at night  . HYDROcodone-acetaminophen (NORCO/VICODIN) 5-325 MG tablet Take 1-2 tablets by mouth every 6 (six) hours as needed.  Marland Kitchen ibuprofen (ADVIL,MOTRIN) 200 MG tablet Take 800 mg by mouth every 6 (six) hours as needed (Pain).   Marland Kitchen levothyroxine (SYNTHROID, LEVOTHROID) 112 MCG tablet TAKE 1 TABLET BY MOUTH DAILY BEFORE BREAKFAST.  Marland Kitchen lisinopril-hydrochlorothiazide (PRINZIDE,ZESTORETIC) 10-12.5 MG tablet TAKE (1) TABLET BY MOUTH DAILY  . Multiple Vitamin (MULTIVITAMIN) capsule Take 1 capsule by mouth daily.    . nortriptyline (PAMELOR) 25 MG capsule Take 1 capsule (25 mg total) by mouth at bedtime.  . Omega-3 Fatty Acids (FISH OIL) 1000 MG CAPS Take 1 capsule by mouth daily.    . ondansetron (ZOFRAN ODT) 4 MG disintegrating tablet Take 1 tablet (4 mg total) by mouth every 8 (eight) hours as needed for nausea or vomiting.  . potassium chloride (K-DUR,KLOR-CON) 10 MEQ tablet Take 10 mEq by mouth daily.  Marland Kitchen senna-docusate (SENOKOT S) 8.6-50 MG per tablet Take 1 tablet by mouth daily as needed for constipation.  Marland Kitchen amoxicillin (AMOXIL) 500 MG capsule Take 1 capsule by mouth 4 (four) times daily.  . potassium chloride (MICRO-K) 10 MEQ CR capsule Take 1 capsule by mouth daily.   No facility-administered encounter medications on file as of 08/30/2017.      Review of Systems  Constitutional: Negative.   HENT:  Negative.   Eyes: Negative.   Respiratory: Negative.   Cardiovascular: Negative.   Gastrointestinal: Negative.   Endocrine: Negative.   Genitourinary: Negative.   Musculoskeletal: Negative.   Skin: Negative.   Allergic/Immunologic: Negative.   Neurological: Positive for syncope (near ), weakness, light-headedness and headaches.       Hands and feet tingle with near syncopal episodes  Hematological: Negative.   Psychiatric/Behavioral: Negative.        Objective:   Physical Exam  Constitutional: She is oriented to person, place, and time. She appears well-developed and well-nourished.  Non-toxic appearance. She does not appear ill. No distress.  Patient is pleasant and alert and somewhat worried about the spells that she is having.  HENT:  Head: Normocephalic and atraumatic.  Right Ear: External ear normal.  Left Ear: External ear  normal.  Nose: Nose normal.  Mouth/Throat: Oropharynx is clear and moist.  Eyes: Pupils are equal, round, and reactive to light. Conjunctivae and EOM are normal. Right eye exhibits no discharge. Left eye exhibits no discharge. No scleral icterus. Right eye exhibits normal extraocular motion and no nystagmus. Left eye exhibits normal extraocular motion and no nystagmus.  Neck: Normal range of motion. Neck supple. No JVD present. No neck rigidity. No thyromegaly present.  Cardiovascular: Normal rate, regular rhythm, normal heart sounds and intact distal pulses.  No murmur heard. The heart is regular at 60/min with no murmurs.  Pulmonary/Chest: Effort normal and breath sounds normal. She has no wheezes. She has no rales.  Abdominal: Soft. Bowel sounds are normal. She exhibits no mass. There is tenderness. There is no rebound and no guarding.  No masses organ enlargement or bruits.  Patient did have some suprapubic tenderness.  Musculoskeletal: Normal range of motion. She exhibits no edema or tenderness.  Lymphadenopathy:    She has no cervical adenopathy.    Neurological: She is alert and oriented to person, place, and time. She has normal reflexes. She displays normal reflexes. No cranial nerve deficit.  Skin: Skin is warm and dry. No rash noted.  Psychiatric: She has a normal mood and affect. Her behavior is normal. Judgment and thought content normal.  Patient is alert with normal mood and behavior other than concerned about the spells that she is having.  Nursing note and vitals reviewed.   BP (!) 147/63 (BP Location: Right Arm)   Pulse (!) 54   Temp (!) 96.9 F (36.1 C) (Oral)   Ht _0  (1.753 m)   Wt 161 lb (73 kg)   BMI 23.78 kg/m        Assessment & Plan:  1. Trigeminal neuralgia -Because of the numbness and muscle contractions with the spells we will ask her to go see neurologist again.  She also complains with right sided headache. - BMP8+EGFR  2. Potassium serum decreased -Patient feels the potassium is making her nauseated we will ask her to stop this and repeat a BMP again in 2 to 3 weeks after stopping it. - BMP8+EGFR  3. Episodic paroxysmal hemicrania, not intractable -This sounds like a migraine type equivalent.  It seems to occur with each headache on the right side of her head.  4. Palpitations -These occur with most of the weak spells.  Today her heart had a regular rate and rhythm at 60/min.  We will go ahead and apply an event monitor to see if we can make sure this is not cardiac and schedule her a visit with a cardiologist.  5. Weakness -Recheck BMP after being on potassium 10 mEq daily for a week with a history of a low potassium but a normal potassium on August 8.  6. Hypokalemia -Recheck BMP today and again in 3 weeks after leaving off the potassium.  Patient Instructions  We will get a BMP today to recheck the potassium Since she think the potassium is making you nauseated we will ask you to hold the potassium and not take anymore and repeat the BMP in 2 to 3 weeks. We will hook you up with an  event monitor to see if this is a cardiac issue We will schedule you for a visit with a cardiologist We will schedule you for a visit with a neurologist You will check your pulse when you feel the palpitations and see if it is irregular or regular. You  continue to drink plenty of water and fluids Patient here and take a copy of blood work with you when you go visit with the specialist.  Dr. Percival Spanish can see this on the computer and so can Dr. Jannifer Franklin.  Arrie Senate MD

## 2017-08-31 LAB — BMP8+EGFR
BUN / CREAT RATIO: 18 (ref 12–28)
BUN: 10 mg/dL (ref 8–27)
CHLORIDE: 104 mmol/L (ref 96–106)
CO2: 23 mmol/L (ref 20–29)
Calcium: 10 mg/dL (ref 8.7–10.3)
Creatinine, Ser: 0.56 mg/dL — ABNORMAL LOW (ref 0.57–1.00)
GFR calc Af Amer: 105 mL/min/{1.73_m2} (ref >59)
GFR calc non Af Amer: 91 mL/min/{1.73_m2} (ref >59)
GLUCOSE: 82 mg/dL (ref 65–99)
POTASSIUM: 4.3 mmol/L (ref 3.5–5.2)
SODIUM: 145 mmol/L — AB (ref 134–144)

## 2017-08-31 LAB — URINE CULTURE

## 2017-09-01 ENCOUNTER — Telehealth: Payer: Self-pay | Admitting: Family Medicine

## 2017-09-01 NOTE — Telephone Encounter (Signed)
Pt aware.

## 2017-09-06 ENCOUNTER — Other Ambulatory Visit: Payer: Self-pay | Admitting: Family Medicine

## 2017-09-07 ENCOUNTER — Encounter: Payer: Self-pay | Admitting: Family Medicine

## 2017-09-14 NOTE — Addendum Note (Signed)
Addended by: Marin Olp on: 09/14/2017 03:31 PM   Modules accepted: Orders

## 2017-09-21 ENCOUNTER — Ambulatory Visit (INDEPENDENT_AMBULATORY_CARE_PROVIDER_SITE_OTHER): Payer: Medicare Other

## 2017-09-21 VITALS — BP 105/64 | HR 67 | Temp 97.6°F | Ht 69.0 in | Wt 158.0 lb

## 2017-09-21 DIAGNOSIS — Z0000 Encounter for general adult medical examination without abnormal findings: Secondary | ICD-10-CM | POA: Diagnosis not present

## 2017-09-21 NOTE — Progress Notes (Addendum)
Subjective:   [name] is a 77 y.o. female who presents for Medicare Annual (Subsequent) preventive examination.  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  She retired ten years ago at the age of 86.  She enjoys working in her yard, gardening and Medical laboratory scientific officer vegetables.  She is very active in her church and enjoys spending time there and with friends.  Review of Systems:  Cardiac Risk Factors include: advanced age (>44men, >31 women);hypertension     Objective:     Vitals: BP 105/64   Pulse 67   Temp 97.6 F (36.4 C) (Oral)   Ht 5\' 9"  (1.753 m)   Wt 158 lb (71.7 kg)   BMI 23.33 kg/m   Body mass index is 23.33 kg/m.  Advanced Directives 09/21/2017 05/19/2017 02/13/2016 12/01/2015 08/08/2014 06/07/2014 02/28/2014  Does Patient Have a Medical Advance Directive? No No No No No No No  Type of Advance Directive - - - - - - -  Would patient like information on creating a medical advance directive? Yes (MAU/Ambulatory/Procedural Areas - Information given) - No - Patient declined No - Patient declined No - patient declined information Yes - Educational materials given No - patient declined information    Tobacco Social History   Tobacco Use  Smoking Status Never Smoker  Smokeless Tobacco Never Used      Clinical Intake   Past Medical History:  Diagnosis Date  . Anal or rectal pain   . Anxiety state, unspecified   . Arthritis   . Arthropathy, unspecified, site unspecified   . Carpal tunnel syndrome    Bilateral  . Cataract   . Chronic airway obstruction, not elsewhere classified    patient denies on 02/27/14  . Esophageal reflux   . Esophageal stricture   . Hypertension   . Internal hemorrhoids without mention of complication   . Malignant neoplasm of breast (female), unspecified site    left breast   . Memory disorder 09/24/2013  . Osteopenia   . Other and  unspecified hyperlipidemia   . Pain in joint, multiple sites   . Personal history of colonic polyps    adenomatous  . Polyneuropathy in other diseases classified elsewhere ([place]) 09/24/2013  . Shingles   . Sleep apnea    cpap   . Unspecified hypothyroidism    Past Surgical History:  Procedure Laterality Date  . APPENDECTOMY  03/2014  . BACK SURGERY     X2  . BREAST LUMPECTOMY    . EYE SURGERY    . LAPAROSCOPIC APPENDECTOMY N/A 02/28/2014   Procedure: APPENDECTOMY LAPAROSCOPIC PARTIAL CECECTOMY FOR MUCOCELE OF APPENDIX ;  Surgeon: Michael Boston, MD;  Location: [facility];  Service: General;  Laterality: N/A;  . MASTECTOMY     left  . THYROIDECTOMY    . TUBAL LIGATION     Family History  Problem Relation Age of Onset  . Bipolar disorder Sister   . Early death Mother   . Early death Sister   . Colon cancer Neg Hx   . Esophageal cancer Neg Hx   . Rectal cancer Neg Hx   . Stomach cancer Neg Hx    Social History   Socioeconomic History  . Marital status: Divorced    Spouse name: Not on file  . Number of children: 2  . Years of education: Not on file  . Highest education level:  Not on file  Occupational History  . Occupation: Retired    Fish farm manager: RETIRED  Social Needs  . Financial resource strain: Not hard at all  . Food insecurity:    Worry: Never true    Inability: Never true  . Transportation needs:    Medical: No    Non-medical: No  Tobacco Use  . Smoking status: Never Smoker  . Smokeless tobacco: Never Used  Substance and Sexual Activity  . Alcohol use: No    Alcohol/week: 0.0 standard drinks  . Drug use: No  . Sexual activity: Never  Lifestyle  . Physical activity:    Days per week: 4 days    Minutes per session: 20 min  . Stress: Not at all  Relationships  . Social connections:    Talks on phone: More than three times a week    Gets together: More than three times a week    Attends religious service: More than 4 times per year    Active member of club or  organization: Yes    Attends meetings of clubs or organizations: More than 4 times per year    Relationship status: Divorced  Other Topics Concern  . Not on file  Social History Narrative   Patient lives at home alone and she is divorced.   Retired.   Education business course.   Right handed.   Caffeine sometimes tea.     Outpatient Encounter Medications as of 09/21/2017  Medication Sig  . alendronate (FOSAMAX) 70 MG tablet Take 1 tablet (70 mg total) by mouth every 7 (seven) days. Take with a full glass of water on an empty stomach.  Marland Kitchen aspirin 81 MG tablet Take 81 mg by mouth 3 (three) times a week.   . busPIRone (BUSPAR) 30 MG tablet TAKE (1/2) TO (1) TABLET BY MOUTH ONCE DAILY AS NEEDED  . Calcium Carbonate-Vitamin D (CALCIUM 500/D) 500-125 MG-UNIT TABS Take 1 capsule by mouth daily.    . Cyanocobalamin (VITAMIN B 12 PO) Take 1 tablet by mouth daily.   Marland Kitchen donepezil (ARICEPT) 10 MG tablet Take 1 tablet (10 mg total) by mouth at bedtime.  Marland Kitchen esomeprazole (NEXIUM) 20 MG capsule Take 1 capsule (20 mg total) by mouth daily.  Marland Kitchen ezetimibe (ZETIA) 10 MG tablet TAKE 1 TAB BY MOUTH DAILY  . gabapentin (NEURONTIN) 300 MG capsule One capsule in the morning and 2 at night  . ibuprofen (ADVIL,MOTRIN) 200 MG tablet Take 800 mg by mouth every 6 (six) hours as needed (Pain).   Marland Kitchen levothyroxine (SYNTHROID, LEVOTHROID) 112 MCG tablet TAKE 1 TABLET BY MOUTH DAILY BEFORE BREAKFAST.  Marland Kitchen lisinopril-hydrochlorothiazide (PRINZIDE,ZESTORETIC) 10-12.5 MG tablet TAKE 1 TAB BY MOUTH DAILY  . Multiple Vitamin (MULTIVITAMIN) capsule Take 1 capsule by mouth daily.    . Omega-3 Fatty Acids (FISH OIL) 1000 MG CAPS Take 1 capsule by mouth daily.    Marland Kitchen senna-docusate (SENOKOT S) 8.6-50 MG per tablet Take 1 tablet by mouth daily as needed for constipation.  Marland Kitchen HYDROcodone-acetaminophen (NORCO/VICODIN) 5-325 MG tablet Take 1-2 tablets by mouth every 6 (six) hours as needed. (Patient not taking: Reported on 09/21/2017)  .  [DISCONTINUED] amoxicillin (AMOXIL) 500 MG capsule Take 1 capsule by mouth 4 (four) times daily.  . [DISCONTINUED] Apoaequorin (PREVAGEN) 10 MG CAPS Take 10 mg by mouth every evening.   . [DISCONTINUED] clobetasol (TEMOVATE) 0.05 % external solution Apply 1 application topically 2 (two) times a week.  . [DISCONTINUED] nortriptyline (PAMELOR) 25 MG capsule Take 1 capsule (  25 mg total) by mouth at bedtime.  . [DISCONTINUED] ondansetron (ZOFRAN ODT) 4 MG disintegrating tablet Take 1 tablet (4 mg total) by mouth every 8 (eight) hours as needed for nausea or vomiting.  . [DISCONTINUED] potassium chloride (K-DUR,KLOR-CON) 10 MEQ tablet Take 10 mEq by mouth daily.  . [DISCONTINUED] potassium chloride (MICRO-K) 10 MEQ CR capsule Take 1 capsule by mouth daily.   No facility-administered encounter medications on file as of 09/21/2017.     Activities of Daily Living In your present state of health, do you have any difficulty performing the following activities: 09/21/2017  Hearing? N  Vision? N  Difficulty concentrating or making decisions? N  Walking or climbing stairs? N  Dressing or bathing? N  Doing errands, shopping? N  Preparing Food and eating ? N  Using the Toilet? N  In the past six months, have you accidently leaked urine? N  Do you have problems with loss of bowel control? N  Managing your Medications? N  Managing your Finances? N  Housekeeping or managing your Housekeeping? N  Some recent data might be hidden    Patient Care Team: Chipper Herb, MD as PCP - General (Family Medicine) Boyd Kerbs, MD (Specialist) Magrinat, Virgie Dad, MD as Consulting Physician (Oncology) Clent Jacks, MD as Consulting Physician (Ophthalmology) Kathrynn Ducking, MD as Consulting Physician (Neurology) Minus Breeding, MD as Consulting Physician (Cardiology) Pyrtle, Lajuan Lines, MD as Consulting Physician (Gastroenterology)    Assessment:   This is a routine wellness examination for  [place].  Exercise Activities and Dietary recommendations Current Exercise Habits: Home exercise routine, Type of exercise: walking, Time (Minutes): 20, Frequency (Times/Week): 4, Weekly Exercise (Minutes/Week): 80, Intensity: Mild, Exercise limited by: orthopedic condition(s)  Goals    . Exercise 150 min/wk Moderate Activity    . LIFESTYLE - DECREASE FALLS RISK       Fall Risk Fall Risk  09/21/2017 08/30/2017 08/11/2017 04/07/2017 03/30/2017  Falls in the past year? No No No Yes Yes  Number falls in past yr: - - - 2 or more 2 or more  Injury with Fall? - - - Yes Yes  Comment - - - fell on tailbone, hit head -  Risk for fall due to : - - - Other (Comment) -   Is the patient's home free of loose throw rugs in walkways, pet beds, electrical cords, etc?   Yes      Grab bars in the bathroom? Yes      Handrails on the stairs?   Yes      Adequate lighting?   Yes    Depression Screen PHQ 2/9 Scores 09/21/2017 08/30/2017 08/11/2017 03/30/2017  PHQ - 2 Score 0 0 0 0     Cognitive Function MMSE - Mini Mental State Exam 09/21/2017 04/07/2017 10/07/2016 03/29/2016 08/12/2015  Orientation to time 5 4 4 5 5   Orientation to Place 5 4 5 5 5   Registration 3 3 3 3 3   Attention/ Calculation 5 5 5 4 5   Recall 3 3 3 3 2   Language- name 2 objects 2 2 2 2 2   Language- repeat 1 0 1 1 1   Language- follow 3 step command 3 3 3 3 3   Language- read & follow direction 1 1 1 1 1   Write a sentence 1 1 1 1 1   Copy design 1 1 1 1 1   Total score 30 27 29 29 29      Patient performed well on her MMSE, scoring 30 out  of 30 available points.    Immunization History  Administered Date(s) Administered  . Influenza, High Dose Seasonal PF 10/01/2015, 11/08/2016  . Influenza,inj,Quad PF,6+ Mos 10/24/2012, 10/29/2013, 11/11/2014  . PPD Test 01/17/2017  . Pneumococcal Conjugate-13 01/11/2013, 01/11/2013  . Pneumococcal Polysaccharide-23 06/07/2014  . Tdap 02/11/2016    Qualifies for Shingles Vaccine?  Yes, patient defers  today  Screening Tests Health Maintenance  Topic Date Due  . INFLUENZA VACCINE  10/10/2017 (Originally 08/04/2017)  . PAP SMEAR  02/11/2020 (Originally 10/25/2014)  . COLONOSCOPY  02/13/2019  . DEXA SCAN  08/12/2019  . TETANUS/TDAP  02/10/2026  . PNA vac Low Risk Adult  Completed    Cancer Screenings: Lung: Low Dose CT Chest recommended if Age 44-80 years, 30 pack-year currently smoking OR have quit w/in 15years. Patient does not qualify. Breast:  Up to date on Mammogram? Yes Up to date of Bone Density/Dexa? Yes   Additional Screenings:  Hepatitis C Screening:      Plan:     Follow up with PCP for regularly scheduled visits.  I have personally reviewed and noted the following in the patient's chart:   . Medical and social history . Use of alcohol, tobacco or illicit drugs  . Current medications and supplements . Functional ability and status . Nutritional status . Physical activity . Advanced directives . List of other physicians . Hospitalizations, surgeries, and ER visits in previous 12 months . Vitals . Screenings to include cognitive, depression, and falls . Referrals and appointments  In addition, I have reviewed and discussed with patient certain preventive protocols, quality metrics, and best practice recommendations. A written personalized care plan for preventive services as well as general preventive health recommendations were provided to patient.     Burnadette Pop, LPN  01/12/3233  I have reviewed and agree with the above AWV documentation.   Arrie Senate MD

## 2017-09-21 NOTE — Patient Instructions (Signed)
  [name] , Thank you for taking time to come for your Medicare Wellness Visit. I appreciate your ongoing commitment to your health goals. Please review the following plan we discussed and let me know if I can assist you in the future.   These are the goals we discussed: Goals    . Exercise 150 min/wk Moderate Activity    . LIFESTYLE - DECREASE FALLS RISK       This is a list of the screening recommended for you and due dates:  Health Maintenance  Topic Date Due  . Flu Shot  10/10/2017*  . Pap Smear  02/11/2020*  . Colon Cancer Screening  02/13/2019  . DEXA scan (bone density measurement)  08/12/2019  . Tetanus Vaccine  02/10/2026  . Pneumonia vaccines  Completed  *Topic was postponed. The date shown is not the original due date.

## 2017-09-30 IMAGING — DX DG KNEE STANDING AP BILAT
3 series · 3 of 3 positions shown · non-contrast
Comparison: Right knee radiographs of December 01, 2015

CLINICAL DATA: Bilateral knee pain

EXAM:
BILATERAL KNEES STANDING - 1 VIEW

[knee ap]
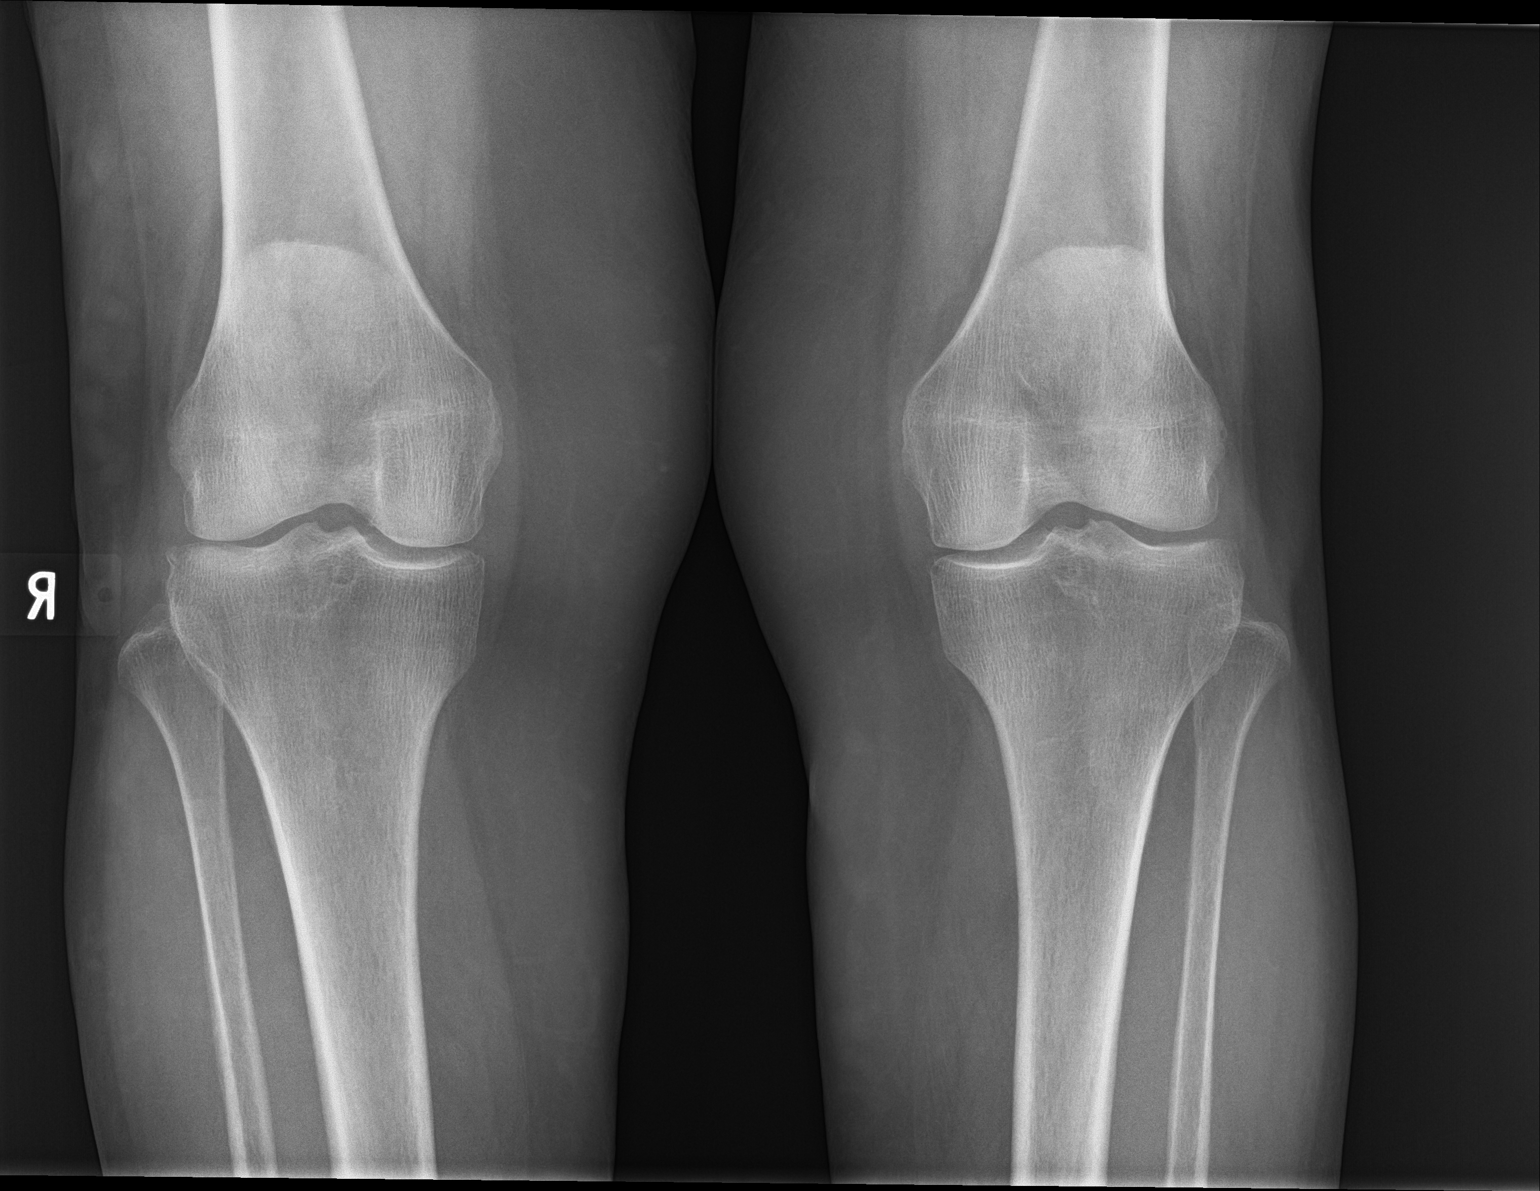

[knee lat (1 of 2)]
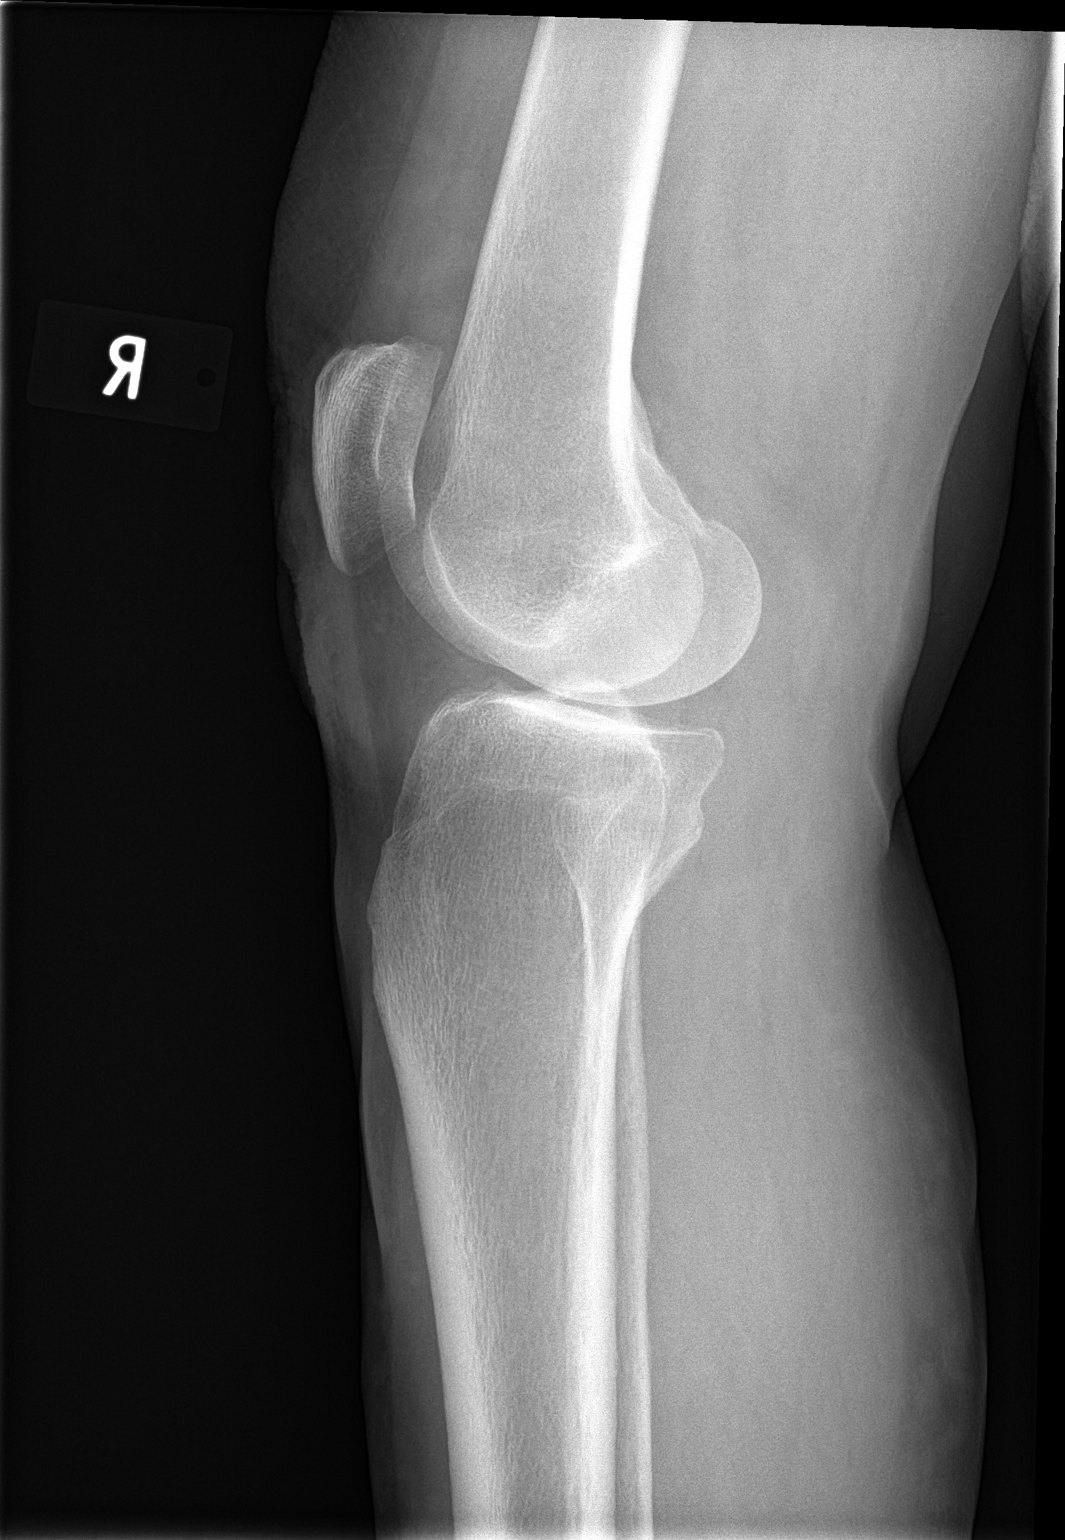

[knee lat (2 of 2)]
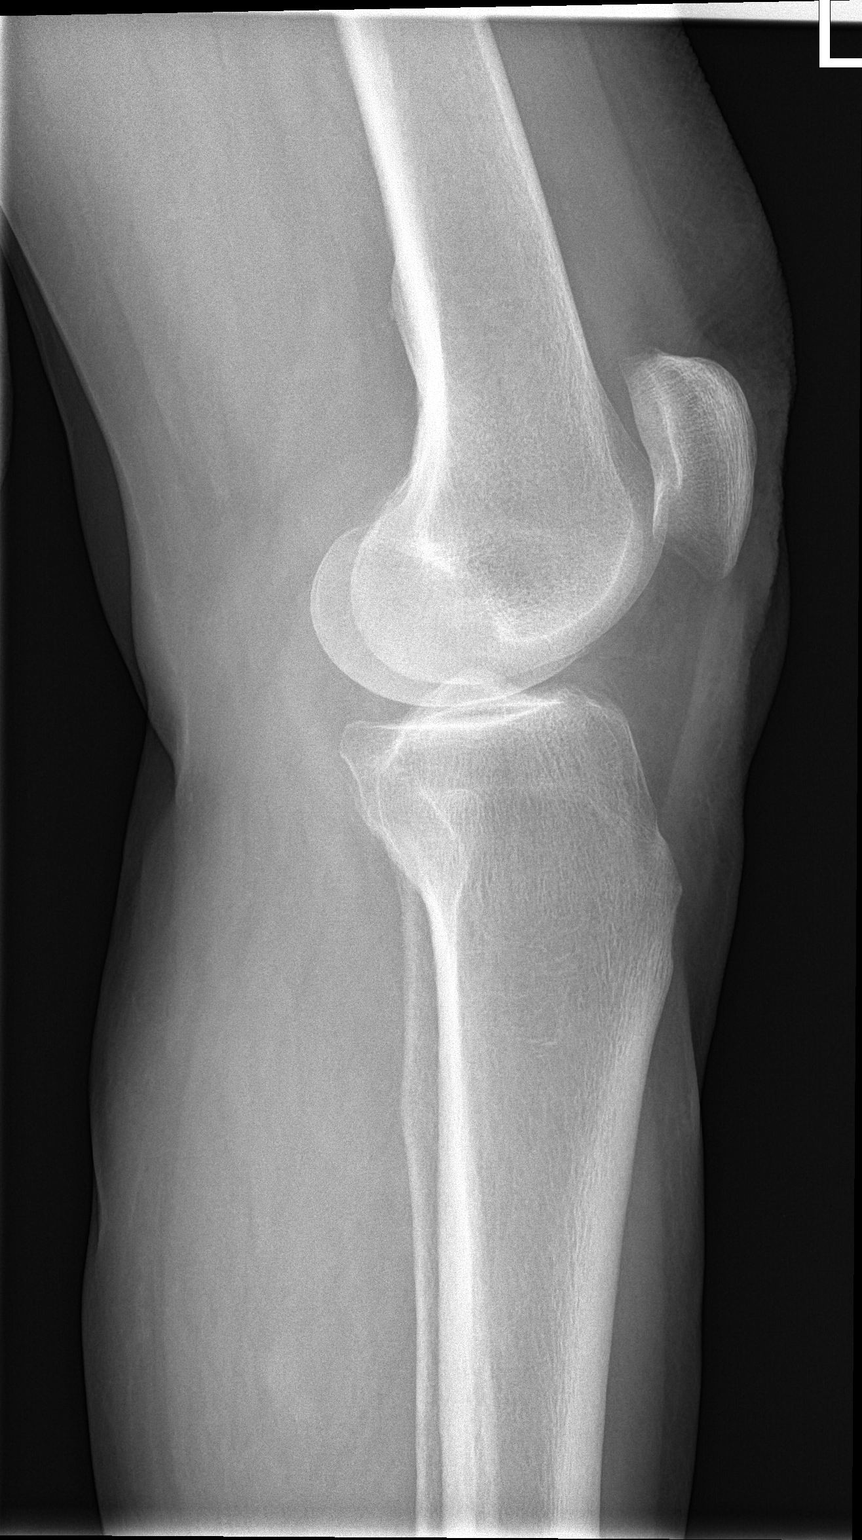

[3 of 3 positions shown; findings below may reference images not displayed]

FINDINGS: The bones are subjectively adequately mineralized. There is no acute
or healing fracture. There is mild symmetric narrowing of the medial
and lateral joint compartments bilaterally. The patellofemoral joint
spaces appear well maintained. There are tiny spurs arising from the
tibial spines bilaterally. There is no joint effusion.
IMPRESSION: Mild osteoarthritic joint space loss of the medial and lateral
compartments. No acute bony abnormality.

## 2017-10-13 ENCOUNTER — Ambulatory Visit (INDEPENDENT_AMBULATORY_CARE_PROVIDER_SITE_OTHER): Payer: Medicare Other | Admitting: *Deleted

## 2017-10-13 DIAGNOSIS — Z23 Encounter for immunization: Secondary | ICD-10-CM | POA: Diagnosis not present

## 2017-10-14 ENCOUNTER — Encounter: Payer: Self-pay | Admitting: Neurology

## 2017-10-14 ENCOUNTER — Ambulatory Visit: Payer: Medicare Other | Admitting: Neurology

## 2017-10-14 VITALS — BP 105/72 | HR 66 | Ht 69.5 in | Wt 157.0 lb

## 2017-10-14 DIAGNOSIS — G3184 Mild cognitive impairment, so stated: Secondary | ICD-10-CM

## 2017-10-14 DIAGNOSIS — G500 Trigeminal neuralgia: Secondary | ICD-10-CM | POA: Diagnosis not present

## 2017-10-14 DIAGNOSIS — G4489 Other headache syndrome: Secondary | ICD-10-CM | POA: Diagnosis not present

## 2017-10-14 MED ORDER — DONEPEZIL HCL 10 MG PO TABS: 10.0000 mg | ORAL_TABLET | Freq: Every day | ORAL | 3 refills | Status: DC

## 2017-10-14 MED ORDER — VENLAFAXINE HCL ER 37.5 MG PO CP24: ORAL_CAPSULE | ORAL | 3 refills | Status: DC

## 2017-10-14 MED ORDER — GABAPENTIN 300 MG PO CAPS: ORAL_CAPSULE | ORAL | 3 refills | Status: DC

## 2017-10-14 NOTE — Patient Instructions (Signed)
We will start Effexor for the headache and neck discomfort.

## 2017-10-14 NOTE — Progress Notes (Signed)
Reason for visit: Headache, memory disturbance, trigeminal neuralgia  [name] is an 77 y.o. female  History of present illness:  [name] is a 77 year old right-handed white female with a history of a mild memory disturbance.  The patient is on Aricept, her memory issues have remained relatively stable over time, she continues to be able to operate a motor vehicle.  She has some issues with trigeminal neuralgia on the right, this has been under relatively good control with gabapentin taking 300 mg in the morning and 600 mg in the evening.  The patient believes that the medication may be making her feel slightly imbalanced and slightly spacey.  The patient indicates that she has developed a new headache problem over the last year, it has gotten more severe recently, the patient believes that she is having about 10 headache days a month.  The headaches tend to start around the right eye with a sharp pain and then go into a headache that is in the frontotemporal area.  A recent CT scan of the brain was done and appears to show no acute changes or any source of headache.  The patient may take Motrin for the headache.  She also has some neck discomfort, crepitus in the neck, she has a burning and itching sensation in the base the neck going down the spine on occasion.  She has had MRI evaluation of the cervical spine that did show some degenerative changes.  The patient returns to this office for an evaluation.  Past Medical History:  Diagnosis Date  . Anal or rectal pain   . Anxiety state, unspecified   . Arthritis   . Arthropathy, unspecified, site unspecified   . Carpal tunnel syndrome    Bilateral  . Cataract   . Chronic airway obstruction, not elsewhere classified    patient denies on 02/27/14  . Esophageal reflux   . Esophageal stricture   . Hypertension   . Internal hemorrhoids without mention of complication   . Malignant neoplasm of breast (female), unspecified site    left  breast   . Memory disorder 09/24/2013  . Osteopenia   . Other and unspecified hyperlipidemia   . Pain in joint, multiple sites   . Personal history of colonic polyps    adenomatous  . Polyneuropathy in other diseases classified elsewhere ([place]) 09/24/2013  . Shingles   . Sleep apnea    cpap   . Unspecified hypothyroidism     Past Surgical History:  Procedure Laterality Date  . APPENDECTOMY  03/2014  . BACK SURGERY     X2  . BREAST LUMPECTOMY    . EYE SURGERY    . LAPAROSCOPIC APPENDECTOMY N/A 02/28/2014   Procedure: APPENDECTOMY LAPAROSCOPIC PARTIAL CECECTOMY FOR MUCOCELE OF APPENDIX ;  Surgeon: Michael Boston, MD;  Location: [facility];  Service: General;  Laterality: N/A;  . MASTECTOMY     left  . THYROIDECTOMY    . TUBAL LIGATION      Family History  Problem Relation Age of Onset  . Bipolar disorder Sister   . Early death Mother   . Early death Sister   . Colon cancer Neg Hx   . Esophageal cancer Neg Hx   . Rectal cancer Neg Hx   . Stomach cancer Neg Hx     Social history:  reports that she has never smoked. She has never used smokeless tobacco. She reports that she does not drink alcohol or use drugs.    Allergies  Allergen Reactions  . Atorvastatin Other (See Comments)    Muscle cramps  . Meperidine Hcl Nausea And Vomiting    Makes me 'deathly' sick  . Statins Other (See Comments)    REACTION: Patient cannot tolerate due cramps  . Norco [Hydrocodone-Acetaminophen] Nausea And Vomiting  . Sulfonamide Derivatives Other (See Comments)    Unknown reaction    Medications:  Prior to Admission medications   Medication Sig Start Date End Date Taking? Authorizing Provider  alendronate (FOSAMAX) 70 MG tablet Take 1 tablet (70 mg total) by mouth every 7 (seven) days. Take with a full glass of water on an empty stomach. 08/29/17  Yes Chipper Herb, MD  aspirin 81 MG tablet Take 81 mg by mouth 3 (three) times a week.    Yes [provider]  busPIRone (BUSPAR) 30 MG  tablet TAKE (1/2) TO (1) TABLET BY MOUTH ONCE DAILY AS NEEDED 02/23/16  Yes Chipper Herb, MD  Calcium Carbonate-Vitamin D (CALCIUM 500/D) 500-125 MG-UNIT TABS Take 1 capsule by mouth daily.     Yes [provider]  Cyanocobalamin (VITAMIN B 12 PO) Take 1 tablet by mouth daily.    Yes [provider]  donepezil (ARICEPT) 10 MG tablet Take 1 tablet (10 mg total) by mouth at bedtime. 10/07/16  Yes Kathrynn Ducking, MD  esomeprazole (NEXIUM) 20 MG capsule Take 1 capsule (20 mg total) by mouth daily. 06/01/17  Yes Chipper Herb, MD  ezetimibe (ZETIA) 10 MG tablet TAKE 1 TAB BY MOUTH DAILY 08/17/17  Yes Chipper Herb, MD  gabapentin (NEURONTIN) 300 MG capsule One capsule in the morning and 2 at night 10/07/16  Yes Kathrynn Ducking, MD  HYDROcodone-acetaminophen (NORCO/VICODIN) 5-325 MG tablet Take 1-2 tablets by mouth every 6 (six) hours as needed. 12/01/15  Yes Fredia Sorrow, MD  ibuprofen (ADVIL,MOTRIN) 200 MG tablet Take 800 mg by mouth every 6 (six) hours as needed (Pain).    Yes [provider]  levothyroxine (SYNTHROID, LEVOTHROID) 112 MCG tablet TAKE 1 TABLET BY MOUTH DAILY BEFORE BREAKFAST. 02/21/17  Yes Chipper Herb, MD  lisinopril-hydrochlorothiazide (PRINZIDE,ZESTORETIC) 10-12.5 MG tablet TAKE 1 TAB BY MOUTH DAILY 09/08/17  Yes Chipper Herb, MD  Multiple Vitamin (MULTIVITAMIN) capsule Take 1 capsule by mouth daily.     Yes [provider]  Omega-3 Fatty Acids (FISH OIL) 1000 MG CAPS Take 1 capsule by mouth daily.     Yes [provider]  senna-docusate (SENOKOT S) 8.6-50 MG per tablet Take 1 tablet by mouth daily as needed for constipation.   Yes [provider]    ROS:  Out of a complete 14 system review of symptoms, the patient complains only of the following symptoms, and all other reviewed systems are negative.  Decreased activity, fatigue Ear pain Eye discomfort Excessive thirst Restless legs Memory loss, dizziness,  headache, speech difficulty, weakness Neck stiffness Moles, itching  Blood pressure 105/72, pulse 66, height 5' 9.5" (1.765 m), weight 157 lb (71.2 kg).  Physical Exam  General: The patient is alert and cooperative at the time of the examination.  Skin: No significant peripheral edema is noted.   Neurologic Exam  Mental status: The patient is alert and oriented x 3 at the time of the examination. The patient has apparent normal recent and remote memory, with an apparently normal attention span and concentration ability.  Mini-Mental status examination done today shows a total score 30/30.   Cranial nerves: Facial symmetry is present. Speech  is normal, no aphasia or dysarthria is noted. Extraocular movements are full. Visual fields are full.  Motor: The patient has good strength in all 4 extremities.  Sensory examination: Soft touch sensation is symmetric on the face, arms, and legs.  Coordination: The patient has good finger-nose-finger and heel-to-shin bilaterally.  Gait and station: The patient has a normal gait. Tandem gait is normal. Romberg is negative. No drift is seen.  Reflexes: Deep tendon reflexes are symmetric.   CT head 08/26/17:  IMPRESSION: Mild chronic ischemic white matter disease. No acute intracranial abnormality seen.  * CT scan images were reviewed online. I agree with the written report.    Assessment/Plan:  1.  Mild cognitive impairment  2.  Trigeminal neuralgia, right V1 and V2 distribution  3.  Right temporal headache  4.  Cervical spondylosis  The patient is having some issues with headaches currently, we will start Effexor at 37.5 mg daily for 2 weeks and go to 75 mg a day, hopefully this will help the headache and some of the neck discomfort.  The patient will continue on the gabapentin and Aricept, prescriptions were sent in.  The patient will have blood work done today to include a sedimentation rate.  She will follow-up in 6 months.   She will call for any dose adjustments of the Effexor.    Jill Alexanders MD 10/14/2017 9:05 AM  [facility] [address] [place], [place] [address]  Phone [phone] Fax [phone]

## 2017-10-15 LAB — SEDIMENTATION RATE: SED RATE: 6 mm/h (ref 0–40)

## 2017-10-18 ENCOUNTER — Telehealth: Payer: Self-pay | Admitting: *Deleted

## 2017-10-18 NOTE — Telephone Encounter (Signed)
-----   Message from Kathrynn Ducking, MD sent at 10/16/2017  5:31 PM EDT -----  The blood work results are unremarkable. Please call the patient.  ----- Message ----- From: Lavone Neri Lab Results In Sent: 10/15/2017   5:40 AM EDT To: Kathrynn Ducking, MD

## 2017-10-18 NOTE — Telephone Encounter (Signed)
Tried pt number, kept ringing, could not leave message. Tried number on DPR, but phone number no longer in service.  I called daughter listed on DPR ([name]) and LVM for her to call back about lab results.  Ok to let her know labs unremarkable per Dr. Jannifer Franklin. Thank you

## 2017-10-20 NOTE — Telephone Encounter (Signed)
Called, LVM for daughter again.   Tried home number for pt. Was able to speak with patient about unremarkable labs. Advised her to let daughter know I already spoke with her about results since I left her daughter a couple message. She verbalized understanding.

## 2017-11-14 NOTE — Progress Notes (Signed)
HPI The patient presents for follow up of a presyncopal episode.  I saw her for chest pain in 2017.  She had a negative perfusion study.   In August she had episode of presyncope in August  She was at [employer].  She felt very lightheaded and weak and then "almost collapsed" at the table.  She went to an emergency room in [place] where she was traveling and said she was there for about 3 hours.  She said they did not find anything.  She did go to see Dr. Laurance Flatten.  She had an event monitor and I reviewed this.  She does have sinus bradycardia but there were no significant arrhythmias.  She never had an episode like this before.  She has not had any since.  She did not feel any palpitations.  He did not actually lose consciousness.  She had no chest pressure, neck or arm discomfort.  She was told that her potassium was low and apparently this was supplemented.  She is otherwise relatively active and denies any cardiovascular symptoms.   Allergies  Allergen Reactions  . Atorvastatin Other (See Comments)    Muscle cramps  . Meperidine Hcl Nausea And Vomiting    Makes me 'deathly' sick  . Statins Other (See Comments)    REACTION: Patient cannot tolerate due cramps  . Norco [Hydrocodone-Acetaminophen] Nausea And Vomiting  . Sulfonamide Derivatives Other (See Comments)    Unknown reaction    Current Outpatient Medications  Medication Sig Dispense Refill  . alendronate (FOSAMAX) 70 MG tablet Take 1 tablet (70 mg total) by mouth every 7 (seven) days. Take with a full glass of water on an empty stomach. 4 tablet 11  . aspirin 81 MG tablet Take 81 mg by mouth 3 (three) times a week.     . busPIRone (BUSPAR) 30 MG tablet TAKE (1/2) TO (1) TABLET BY MOUTH ONCE DAILY AS NEEDED 30 tablet 0  . Calcium Carbonate-Vitamin D (CALCIUM 500/D) 500-125 MG-UNIT TABS Take 1 capsule by mouth daily.      . Cyanocobalamin (VITAMIN B 12 PO) Take 1 tablet by mouth daily.     Marland Kitchen donepezil (ARICEPT) 10 MG tablet Take  1 tablet (10 mg total) by mouth at bedtime. 90 tablet 3  . ezetimibe (ZETIA) 10 MG tablet TAKE 1 TAB BY MOUTH DAILY 90 tablet 1  . gabapentin (NEURONTIN) 300 MG capsule One capsule in the morning and 2 at night 270 capsule 3  . HYDROcodone-acetaminophen (NORCO/VICODIN) 5-325 MG tablet Take 1-2 tablets by mouth every 6 (six) hours as needed. 10 tablet 0  . ibuprofen (ADVIL,MOTRIN) 200 MG tablet Take 800 mg by mouth every 6 (six) hours as needed (Pain).     Marland Kitchen levothyroxine (SYNTHROID, LEVOTHROID) 112 MCG tablet TAKE 1 TABLET BY MOUTH DAILY BEFORE BREAKFAST. 30 tablet 9  . lisinopril-hydrochlorothiazide (PRINZIDE,ZESTORETIC) 10-12.5 MG tablet TAKE 1 TAB BY MOUTH DAILY 30 tablet 5  . Multiple Vitamin (MULTIVITAMIN) capsule Take 1 capsule by mouth daily.      . Omega-3 Fatty Acids (FISH OIL) 1000 MG CAPS Take 1 capsule by mouth daily.      Marland Kitchen senna-docusate (SENOKOT S) 8.6-50 MG per tablet Take 1 tablet by mouth daily as needed for constipation.    Marland Kitchen venlafaxine XR (EFFEXOR XR) 37.5 MG 24 hr capsule One tablet daily for 2 weeks, then take 2 tablets daily (Patient not taking: Reported on 11/16/2017) 60 capsule 3   No current facility-administered medications for this  visit.     Past Medical History:  Diagnosis Date  . Anal or rectal pain   . Anxiety state, unspecified   . Arthritis   . Arthropathy, unspecified, site unspecified   . Carpal tunnel syndrome    Bilateral  . Cataract   . Chronic airway obstruction, not elsewhere classified    patient denies on 02/27/14  . Esophageal reflux   . Esophageal stricture   . Hypertension   . Internal hemorrhoids without mention of complication   . Malignant neoplasm of breast (female), unspecified site    left breast   . Memory disorder 09/24/2013  . Osteopenia   . Other and unspecified hyperlipidemia   . Pain in joint, multiple sites   . Personal history of colonic polyps    adenomatous  . Polyneuropathy in other diseases classified elsewhere ([place])  09/24/2013  . Shingles   . Sleep apnea    cpap   . Unspecified hypothyroidism     Past Surgical History:  Procedure Laterality Date  . APPENDECTOMY  03/2014  . BACK SURGERY     X2  . BREAST LUMPECTOMY    . EYE SURGERY    . LAPAROSCOPIC APPENDECTOMY N/A 02/28/2014   Procedure: APPENDECTOMY LAPAROSCOPIC PARTIAL CECECTOMY FOR MUCOCELE OF APPENDIX ;  Surgeon: Michael Boston, MD;  Location: [facility];  Service: General;  Laterality: N/A;  . MASTECTOMY     left  . THYROIDECTOMY    . TUBAL LIGATION      ROS:  As stated in the HPI and negative for all other systems.    PHYSICAL EXAM  BP 138/60   Pulse (!) 51   Ht 5' 8.5" (1.74 m)   Wt 159 lb (72.1 kg)   BMI 23.82 kg/m  GENERAL:  Well appearing NECK:  No jugular venous distention, waveform within normal limits, carotid upstroke brisk and symmetric, no bruits, no thyromegaly LUNGS:  Clear to auscultation bilaterally CHEST: Left mastectomy  HEART:  PMI not displaced or sustained,S1 and S2 within normal limits, no S3, no S4, no clicks, no rubs, no murmurs ABD:  Flat, positive bowel sounds normal in frequency in pitch, no bruits, no rebound, no guarding, no midline pulsatile mass, no hepatomegaly, no splenomegaly EXT:  2 plus pulses throughout, no edema, no cyanosis no clubbing   EKG:  NSR, rate 51 axis WNL, intervals WNL, no acute ST T wave changes.  Her QT was slightly prolonged.  04/04/13  ASSESSMENT AND PLAN  BRADYCARDIA:   She had no significant arrhythmias on a recent monitor.  She had no further episodes of presyncope.  I do not know that this was related at all.  She otherwise feels well.  At this point I do not think that further cardiovascular testing is suggested unless she has recurrent symptoms.  She will let me know.   HTN:  The blood pressure is at target.  No change in therapy is indicated.   PRESYNCOPE:  As above.

## 2017-11-16 ENCOUNTER — Encounter: Payer: Self-pay | Admitting: Cardiology

## 2017-11-16 ENCOUNTER — Ambulatory Visit (INDEPENDENT_AMBULATORY_CARE_PROVIDER_SITE_OTHER): Payer: Medicare Other | Admitting: Cardiology

## 2017-11-16 VITALS — BP 138/60 | HR 51 | Ht 68.5 in | Wt 159.0 lb

## 2017-11-16 DIAGNOSIS — I10 Essential (primary) hypertension: Secondary | ICD-10-CM

## 2017-11-16 DIAGNOSIS — R001 Bradycardia, unspecified: Secondary | ICD-10-CM

## 2017-11-16 DIAGNOSIS — R42 Dizziness and giddiness: Secondary | ICD-10-CM

## 2017-11-16 NOTE — Patient Instructions (Signed)
Medication Instructions:  The current medical regimen is effective;  continue present plan and medications.  If you need a refill on your cardiac medications before your next appointment, please call your pharmacy.   Follow-Up: Follow up in 1 year with Dr. Hochrein.  You will receive a letter in the mail 2 months before you are due.  Please call us when you receive this letter to schedule your follow up appointment.  Thank you for choosing [facility]!!     

## 2017-11-24 ENCOUNTER — Other Ambulatory Visit: Payer: Self-pay | Admitting: Family Medicine

## 2017-12-21 ENCOUNTER — Ambulatory Visit: Payer: Medicare Other | Admitting: Family Medicine

## 2018-01-19 ENCOUNTER — Other Ambulatory Visit (HOSPITAL_COMMUNITY): Payer: Self-pay | Admitting: Family Medicine

## 2018-01-19 DIAGNOSIS — Z1231 Encounter for screening mammogram for malignant neoplasm of breast: Secondary | ICD-10-CM

## 2018-02-15 ENCOUNTER — Other Ambulatory Visit: Payer: Self-pay | Admitting: Family Medicine

## 2018-02-15 DIAGNOSIS — C50912 Malignant neoplasm of unspecified site of left female breast: Secondary | ICD-10-CM | POA: Diagnosis not present

## 2018-02-24 ENCOUNTER — Ambulatory Visit (INDEPENDENT_AMBULATORY_CARE_PROVIDER_SITE_OTHER): Payer: Medicare Other

## 2018-02-24 ENCOUNTER — Ambulatory Visit (INDEPENDENT_AMBULATORY_CARE_PROVIDER_SITE_OTHER): Payer: Medicare Other | Admitting: Family Medicine

## 2018-02-24 ENCOUNTER — Encounter: Payer: Self-pay | Admitting: Family Medicine

## 2018-02-24 VITALS — BP 148/59 | HR 58 | Temp 96.9°F | Ht 68.5 in | Wt 164.0 lb

## 2018-02-24 DIAGNOSIS — E7800 Pure hypercholesterolemia, unspecified: Secondary | ICD-10-CM | POA: Diagnosis not present

## 2018-02-24 DIAGNOSIS — R200 Anesthesia of skin: Secondary | ICD-10-CM

## 2018-02-24 DIAGNOSIS — R531 Weakness: Secondary | ICD-10-CM

## 2018-02-24 DIAGNOSIS — K219 Gastro-esophageal reflux disease without esophagitis: Secondary | ICD-10-CM | POA: Diagnosis not present

## 2018-02-24 DIAGNOSIS — I10 Essential (primary) hypertension: Secondary | ICD-10-CM

## 2018-02-24 DIAGNOSIS — G63 Polyneuropathy in diseases classified elsewhere: Secondary | ICD-10-CM

## 2018-02-24 DIAGNOSIS — R6884 Jaw pain: Secondary | ICD-10-CM | POA: Diagnosis not present

## 2018-02-24 DIAGNOSIS — E559 Vitamin D deficiency, unspecified: Secondary | ICD-10-CM

## 2018-02-24 DIAGNOSIS — M2550 Pain in unspecified joint: Secondary | ICD-10-CM

## 2018-02-24 DIAGNOSIS — E890 Postprocedural hypothyroidism: Secondary | ICD-10-CM | POA: Diagnosis not present

## 2018-02-24 DIAGNOSIS — M47812 Spondylosis without myelopathy or radiculopathy, cervical region: Secondary | ICD-10-CM | POA: Diagnosis not present

## 2018-02-24 DIAGNOSIS — I700 Atherosclerosis of aorta: Secondary | ICD-10-CM

## 2018-02-24 DIAGNOSIS — G44211 Episodic tension-type headache, intractable: Secondary | ICD-10-CM

## 2018-02-24 DIAGNOSIS — M4802 Spinal stenosis, cervical region: Secondary | ICD-10-CM | POA: Diagnosis not present

## 2018-02-24 LAB — MICROSCOPIC EXAMINATION
BACTERIA UA: NONE SEEN
RBC, UA: NONE SEEN /hpf (ref 0–2)
Renal Epithel, UA: NONE SEEN /hpf

## 2018-02-24 LAB — URINALYSIS, COMPLETE
Bilirubin, UA: NEGATIVE
Glucose, UA: NEGATIVE
Ketones, UA: NEGATIVE
Leukocytes, UA: NEGATIVE
Nitrite, UA: NEGATIVE
Protein, UA: NEGATIVE
RBC UA: NEGATIVE
Specific Gravity, UA: 1.015 (ref 1.005–1.030)
Urobilinogen, Ur: 0.2 mg/dL (ref 0.2–1.0)
pH, UA: 6.5 (ref 5.0–7.5)

## 2018-02-24 MED ORDER — LEVOTHYROXINE SODIUM 112 MCG PO TABS: 112.0000 ug | ORAL_TABLET | Freq: Every day | ORAL | 3 refills | Status: DC

## 2018-02-24 NOTE — Progress Notes (Signed)
Subjective:    Patient ID: [name], female    DOB: 10-17-1940, 78 y.o.   MRN: [id number]  HPI Pt here for follow up and management of chronic medical problems which includes hypertension and hyperlipidemia. She is taking medication regularly.  Patient comes in today with multiple complaints involving her hands and feet jaw pain bone pain in general pain around the eyes and she has seen Dr. Carolynn Sayers.  She has headaches in the morning.  Her blood pressure slightly elevated today.  She will get lab work today will be given an FOBT to return and will get her mammogram next week.  The patient did have a CT scan of her chest in November 2018.  The results of this indicated no explanation for left chest wall pain that she was having at the time she did have aortic atherosclerosis.  It is pleasant but just does not feel good.  She is planning to arrange an appointment with her neurologist.  The pain she is having in her head does not feel like her trigeminal neuralgia.  She complains of her hands and feet being numb and cold and tingling at times it is worse that ever and heat relieves this.  She complains of jaw pain and she has been on the Fosamax since last summer but we will have her hold that.  She did see the dentist and apparently x-rays were done and there is no sign of any osteonecrosis but we will still have her hold the Fosamax.  She complains of headache which also includes the pain in her eyes and she did see Dr. Carolynn Sayers and he did not have an answer for this.  She is on thyroid replacement.  She just feels weak.  She denies any chest pain or shortness of breath.  She is active.  She does have nausea at times and it seems to be worse in the morning but is actually not any worse than usual.  She denies any blood in the stool black tarry bowel movements or change in bowel habits.  She is passing her water well without burning but does have frequency.    Patient Active Problem List   Diagnosis Date  Noted  . Aortic atherosclerosis ([place]) 11/08/2016  . Thoracic aortic ectasia ([place]) 11/08/2016  . 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 Polyneuropathy in other diseases classified elsewhere ([place]) 09/24/2013  . Memory disorder 09/24/2013  . Sleep apnea 05/15/2013  . Hypothyroidism, postsurgical 05/15/2013  . HTN (hypertension) 05/15/2013  . Vitamin D deficiency 05/15/2013  . Hyperlipidemia 05/15/2013  . Osteopenia 10/25/2012  . Mild cognitive impairment with memory loss 09/27/2012  . Trigeminal neuralgia 04/02/2011  . Dysphagia 07/23/2010  . GERD (gastroesophageal reflux disease) 07/23/2010  . Right facial pain 07/23/2010  . ANXIETY 05/24/2008  . COPD 05/24/2008  . CONSTIPATION 05/24/2008  . ARTHRITIS 05/24/2008  . Idiopathic osteoporosis 05/24/2008  . Malignant neoplasm of upper-outer quadrant of left female breast ([place]) 05/22/2008  . INTERNAL HEMORRHOIDS 05/22/2008  . COLONIC POLYPS, ADENOMATOUS, HX OF 05/22/2008   Outpatient Encounter Medications as of 02/24/2018  Medication Sig  . alendronate (FOSAMAX) 70 MG tablet Take 1 tablet (70 mg total) by mouth every 7 (seven) days. Take with a full glass of water on an empty stomach.  Marland Kitchen aspirin 81 MG tablet Take 81 mg by mouth 3 (three) times a week.   . busPIRone (BUSPAR) 30 MG tablet TAKE (1/2) TO (1) TABLET BY MOUTH ONCE DAILY  AS NEEDED  . Calcium Carbonate-Vitamin D (CALCIUM 500/D) 500-125 MG-UNIT TABS Take 1 capsule by mouth daily.    . Cyanocobalamin (VITAMIN B 12 PO) Take 1 tablet by mouth daily.   Marland Kitchen donepezil (ARICEPT) 10 MG tablet Take 1 tablet (10 mg total) by mouth at bedtime.  Marland Kitchen ezetimibe (ZETIA) 10 MG tablet TAKE 1 TABLET BY MOUTH ONCE DAILY.(Needs to be seen before next refill)  . gabapentin (NEURONTIN) 300 MG capsule One capsule in the morning and 2 at night  . HYDROcodone-acetaminophen (NORCO/VICODIN) 5-325 MG tablet Take 1-2 tablets by mouth every 6 (six) hours as needed.  Marland Kitchen ibuprofen  (ADVIL,MOTRIN) 200 MG tablet Take 800 mg by mouth every 6 (six) hours as needed (Pain).   Marland Kitchen levothyroxine (SYNTHROID, LEVOTHROID) 112 MCG tablet TAKE 1 TABLET BY MOUTH DAILY BEFORE BREAKFAST.  Marland Kitchen lisinopril-hydrochlorothiazide (PRINZIDE,ZESTORETIC) 10-12.5 MG tablet TAKE 1 TAB BY MOUTH DAILY  . Multiple Vitamin (MULTIVITAMIN) capsule Take 1 capsule by mouth daily.    . Omega-3 Fatty Acids (FISH OIL) 1000 MG CAPS Take 1 capsule by mouth daily.    Marland Kitchen senna-docusate (SENOKOT S) 8.6-50 MG per tablet Take 1 tablet by mouth daily as needed for constipation.  . [DISCONTINUED] venlafaxine XR (EFFEXOR XR) 37.5 MG 24 hr capsule One tablet daily for 2 weeks, then take 2 tablets daily (Patient not taking: Reported on 11/16/2017)   No facility-administered encounter medications on file as of 02/24/2018.      Review of Systems  Constitutional: Negative.   HENT: Negative.        Jaw pain   Eyes: Negative.        Pain in eyes - seen Dr Katy Fitch  Respiratory: Negative.   Cardiovascular: Negative.   Gastrointestinal: Negative.   Endocrine: Negative.   Genitourinary: Negative.   Musculoskeletal: Positive for arthralgias (joint and "bone " pain all over").  Skin: Negative.   Allergic/Immunologic: Negative.   Neurological: Negative.        Hands and feet cold and numb  Hematological: Negative.   Psychiatric/Behavioral: Negative.        Objective:   Physical Exam Vitals signs and nursing note reviewed.  Constitutional:      General: She is not in acute distress.    Appearance: Normal appearance. She is well-developed and normal weight. She is not ill-appearing.     Comments: Patient is alert and pleasant.  HENT:     Head: Normocephalic and atraumatic.     Right Ear: Tympanic membrane, ear canal and external ear normal. There is no impacted cerumen.     Left Ear: Tympanic membrane, ear canal and external ear normal. There is no impacted cerumen.     Nose: Nose normal. No congestion.     Mouth/Throat:       Mouth: Mucous membranes are moist.     Pharynx: Oropharynx is clear. No oropharyngeal exudate or posterior oropharyngeal erythema.  Eyes:     General: No scleral icterus.       Right eye: No discharge.        Left eye: No discharge.     Extraocular Movements: Extraocular movements intact.     Conjunctiva/sclera: Conjunctivae normal.     Pupils: Pupils are equal, round, and reactive to light.  Neck:     Musculoskeletal: Normal range of motion and neck supple. Muscular tenderness present. No neck rigidity.     Thyroid: No thyromegaly.     Vascular: No carotid bruit or JVD.     Comments: sLight  tenderness occipital scalp no thyromegaly or adenopathy Cardiovascular:     Rate and Rhythm: Normal rate and regular rhythm.     Pulses: Normal pulses.     Heart sounds: Normal heart sounds. No murmur.     Comments: The heart had a regular rate and rhythm at at 72/min.  There were no pedal edema with good pedal pulses. Pulmonary:     Effort: Pulmonary effort is normal.     Breath sounds: Normal breath sounds. No wheezing or rales.     Comments: Clear anteriorly and posteriorly Abdominal:     General: Abdomen is flat. Bowel sounds are normal.     Palpations: Abdomen is soft. There is no mass.     Tenderness: There is abdominal tenderness. There is no guarding.     Comments: There was tenderness in the epigastric area and suprapubic area.  Bowel sounds were normal and there was no liver or spleen enlargement or masses.  Musculoskeletal: Normal range of motion.        General: No tenderness.     Right lower leg: No edema.     Left lower leg: No edema.  Lymphadenopathy:     Cervical: No cervical adenopathy.  Skin:    General: Skin is warm and dry.     Findings: No rash.  Neurological:     General: No focal deficit present.     Mental Status: She is alert and oriented to person, place, and time. Mental status is at baseline.     Cranial Nerves: No cranial nerve deficit.     Gait: Gait  normal.     Deep Tendon Reflexes: Reflexes are normal and symmetric. Reflexes normal.  Psychiatric:        Mood and Affect: Mood normal.        Behavior: Behavior normal.        Thought Content: Thought content normal.        Judgment: Judgment normal.    BP (!) 148/59 (BP Location: Left Arm)   Pulse (!) 58   Temp (!) 96.9 F (36.1 C) (Oral)   Ht 5' 8.5" (1.74 m)   Wt 164 lb (74.4 kg)   BMI 24.57 kg/m         Assessment & Plan:  1. Essential hypertension -Blood pressure is slightly elevated on the systolic side today and she will continue with current treatment - BMP8+EGFR - CBC with Differential/Platelet - Hepatic function panel - DG Chest 2 View; Future  2. Pure hypercholesterolemia -Continue aggressive therapeutic lifestyle changes and omega-3 fatty acids. - CBC with Differential/Platelet - Lipid panel - DG Chest 2 View; Future  3. Vitamin D deficiency -Continue with vitamin D replacement pending results of lab work - CBC with Differential/Platelet - VITAMIN D 25 Hydroxy (Vit-D Deficiency, Fractures)  4. Gastroesophageal reflux disease, esophagitis presence not specified -Take Pepcid AC twice daily before breakfast and supper as a trial for at least 2 weeks and during this time hold the Fosamax - CBC with Differential/Platelet - Hepatic function panel  5. Hypothyroidism, postsurgical -Continue current treatment pending results of lab work - CBC with Differential/Platelet - Thyroid Panel With TSH  6. Arthralgia, unspecified joint -Take Tylenol if needed for pain - CBC with Differential/Platelet - Sedimentation rate - DG Chest 2 View; Future - DG Cervical Spine Complete; Future - Vitamin B12  7. Numbness in both hands -Check B12 level sed rate and CBC - DG Chest 2 View; Future - DG Cervical Spine Complete; Future -  Vitamin B12  8. Weakness -Check B12 and thyroid  9. Intractable episodic tension-type headache -C-spine films as patient has  tenderness in the posterior neck occipital area  10. Aortic atherosclerosis ([place]) -Continue with aggressive therapeutic lifestyle changes  11. Polyneuropathy in other diseases classified elsewhere ([place]) -B12 level sed rate CBC and thyroid  Meds ordered this encounter  Medications  . levothyroxine (SYNTHROID, LEVOTHROID) 112 MCG tablet    Sig: Take 1 tablet (112 mcg total) by mouth daily before breakfast.    Dispense:  90 tablet    Refill:  3   Patient Instructions                       Medicare Annual Wellness Visit  [facility] and the medical providers at [facility] strive to bring you the best medical care.  In doing so we not only want to address your current medical conditions and concerns but also to detect new conditions early and prevent illness, disease and health-related problems.    Medicare offers a yearly Wellness Visit which allows our clinical staff to assess your need for preventative services including immunizations, lifestyle education, counseling to decrease risk of preventable diseases and screening for fall risk and other medical concerns.    This visit is provided free of charge (no copay) for all Medicare recipients. The clinical pharmacists at [facility] have begun to conduct these Wellness Visits which will also include a thorough review of all your medications.    As you primary medical provider recommend that you make an appointment for your Annual Wellness Visit if you have not done so already this year.  You may set up this appointment before you leave today or you may call back ([phone]) and schedule an appointment.  Please make sure when you call that you mention that you are scheduling your Annual Wellness Visit with the clinical pharmacist so that the appointment may be made for the proper length of time.     Continue current medications. Continue good therapeutic lifestyle changes which include good diet  and exercise. Fall precautions discussed with patient. If an FOBT was given today- please return it to our front desk. If you are over 30 years old - you may need Prevnar 22 or the adult Pneumonia vaccine.  **Flu shots are available--- please call and schedule a FLU-CLINIC appointment**  After your visit with [place] today you will receive a survey in the mail or online from [employer] regarding your care with [place]. Please take a moment to fill this out. Your feedback is very important to [place] as you can help [place] better understand your patient needs as well as improve your experience and satisfaction. WE CARE ABOUT YOU!!!   The patient will call and make an appointment with Dr. Jannifer Franklin We will get all of her blood work back and decide which way to proceed after that is reviewed She will continue to drink plenty of fluids and stay well-hydrated She will take Pepcid AC as a trial twice a day before breakfast and supper for couple of weeks She will leave off the Fosamax for couple of weeks and then will restart it since she has already said x-rays done of her jaws did not show any issues with osteonecrosis according to her dentist. She will get her mammogram as planned We will call with results of lab work and x-rays of the spine and chest when those become available She should  watch her sodium intake more closely  Arrie Senate MD

## 2018-02-24 NOTE — Patient Instructions (Addendum)
Medicare Annual Wellness Visit  [facility] and the medical providers at [facility] strive to bring you the best medical care.  In doing so we not only want to address your current medical conditions and concerns but also to detect new conditions early and prevent illness, disease and health-related problems.    Medicare offers a yearly Wellness Visit which allows our clinical staff to assess your need for preventative services including immunizations, lifestyle education, counseling to decrease risk of preventable diseases and screening for fall risk and other medical concerns.    This visit is provided free of charge (no copay) for all Medicare recipients. The clinical pharmacists at [facility] have begun to conduct these Wellness Visits which will also include a thorough review of all your medications.    As you primary medical provider recommend that you make an appointment for your Annual Wellness Visit if you have not done so already this year.  You may set up this appointment before you leave today or you may call back [phone]) and schedule an appointment.  Please make sure when you call that you mention that you are scheduling your Annual Wellness Visit with the clinical pharmacist so that the appointment may be made for the proper length of time.     Continue current medications. Continue good therapeutic lifestyle changes which include good diet and exercise. Fall precautions discussed with patient. If an FOBT was given today- please return it to our front desk. If you are over 68 years old - you may need Prevnar 65 or the adult Pneumonia vaccine.  **Flu shots are available--- please call and schedule a FLU-CLINIC appointment**  After your visit with [place] today you will receive a survey in the mail or online from [employer] regarding your care with [place]. Please take a moment to fill this out. Your feedback is very  important to [place] as you can help [place] better understand your patient needs as well as improve your experience and satisfaction. WE CARE ABOUT YOU!!!   The patient will call and make an appointment with Dr. Jannifer Spence We will get all of her blood work back and decide which way to proceed after that is reviewed She will continue to drink plenty of fluids and stay well-hydrated She will take Pepcid AC as a trial twice a day before breakfast and supper for couple of weeks She will leave off the Fosamax for couple of weeks and then will restart it since she has already said x-rays done of her jaws did not show any issues with osteonecrosis according to her dentist. She will get her mammogram as planned We will call with results of lab work and x-rays of the spine and chest when those become available She should watch her sodium intake more closely

## 2018-02-25 LAB — CBC WITH DIFFERENTIAL/PLATELET
BASOS: 1 %
Basophils Absolute: 0.1 10*3/uL (ref 0.0–0.2)
EOS (ABSOLUTE): 0.3 10*3/uL (ref 0.0–0.4)
EOS: 4 %
HEMATOCRIT: 37.5 % (ref 34.0–46.6)
HEMOGLOBIN: 13 g/dL (ref 11.1–15.9)
Immature Grans (Abs): 0 10*3/uL (ref 0.0–0.1)
Immature Granulocytes: 0 %
LYMPHS ABS: 2.3 10*3/uL (ref 0.7–3.1)
Lymphs: 34 %
MCH: 31.3 pg (ref 26.6–33.0)
MCHC: 34.7 g/dL (ref 31.5–35.7)
MCV: 90 fL (ref 79–97)
MONOCYTES: 8 %
MONOS ABS: 0.5 10*3/uL (ref 0.1–0.9)
Neutrophils Absolute: 3.6 10*3/uL (ref 1.4–7.0)
Neutrophils: 53 %
Platelets: 254 10*3/uL (ref 150–450)
RBC: 4.16 x10E6/uL (ref 3.77–5.28)
RDW: 12.7 % (ref 11.7–15.4)
WBC: 6.8 10*3/uL (ref 3.4–10.8)

## 2018-02-25 LAB — BMP8+EGFR
BUN / CREAT RATIO: 29 — AB (ref 12–28)
BUN: 15 mg/dL (ref 8–27)
CO2: 25 mmol/L (ref 20–29)
CREATININE: 0.52 mg/dL — AB (ref 0.57–1.00)
Calcium: 9 mg/dL (ref 8.7–10.3)
Chloride: 102 mmol/L (ref 96–106)
GFR calc Af Amer: 107 mL/min/{1.73_m2} (ref >59)
GFR calc non Af Amer: 93 mL/min/{1.73_m2} (ref >59)
GLUCOSE: 93 mg/dL (ref 65–99)
Potassium: 4.5 mmol/L (ref 3.5–5.2)
SODIUM: 140 mmol/L (ref 134–144)

## 2018-02-25 LAB — VITAMIN B12: VITAMIN B 12: 1136 pg/mL (ref 232–1245)

## 2018-02-25 LAB — HEPATIC FUNCTION PANEL
ALT: 19 IU/L (ref 0–32)
AST: 20 IU/L (ref 0–40)
Albumin: 4.7 g/dL (ref 3.7–4.7)
Alkaline Phosphatase: 48 IU/L (ref 39–117)
Bilirubin Total: 0.4 mg/dL (ref 0.0–1.2)
Bilirubin, Direct: 0.1 mg/dL (ref 0.00–0.40)
Total Protein: 6.6 g/dL (ref 6.0–8.5)

## 2018-02-25 LAB — LIPID PANEL
CHOL/HDL RATIO: 4.5 ratio — AB (ref 0.0–4.4)
Cholesterol, Total: 209 mg/dL — ABNORMAL HIGH (ref 100–199)
HDL: 46 mg/dL (ref >39)
LDL CALC: 129 mg/dL — AB (ref 0–99)
TRIGLYCERIDES: 171 mg/dL — AB (ref 0–149)
VLDL Cholesterol Cal: 34 mg/dL (ref 5–40)

## 2018-02-25 LAB — VITAMIN D 25 HYDROXY (VIT D DEFICIENCY, FRACTURES): Vit D, 25-Hydroxy: 38.6 ng/mL (ref 30.0–100.0)

## 2018-02-25 LAB — SEDIMENTATION RATE: SED RATE: 4 mm/h (ref 0–40)

## 2018-02-25 LAB — THYROID PANEL WITH TSH
Free Thyroxine Index: 2.3 (ref 1.2–4.9)
T3 Uptake Ratio: 26 % (ref 24–39)
T4, Total: 9 ug/dL (ref 4.5–12.0)
TSH: 1.88 u[IU]/mL (ref 0.450–4.500)

## 2018-02-26 LAB — URINE CULTURE

## 2018-02-27 ENCOUNTER — Telehealth: Payer: Self-pay | Admitting: *Deleted

## 2018-02-27 NOTE — Telephone Encounter (Signed)
Aware of lab results and recommendations  

## 2018-03-01 DIAGNOSIS — C50912 Malignant neoplasm of unspecified site of left female breast: Secondary | ICD-10-CM | POA: Diagnosis not present

## 2018-03-04 ENCOUNTER — Ambulatory Visit (INDEPENDENT_AMBULATORY_CARE_PROVIDER_SITE_OTHER): Payer: Medicare Other | Admitting: Family Medicine

## 2018-03-04 VITALS — BP 138/75 | HR 64 | Temp 97.4°F | Ht 68.5 in | Wt 162.0 lb

## 2018-03-04 DIAGNOSIS — J441 Chronic obstructive pulmonary disease with (acute) exacerbation: Secondary | ICD-10-CM

## 2018-03-04 DIAGNOSIS — R059 Cough, unspecified: Secondary | ICD-10-CM

## 2018-03-04 DIAGNOSIS — R05 Cough: Secondary | ICD-10-CM | POA: Diagnosis not present

## 2018-03-04 MED ORDER — PREDNISONE 20 MG PO TABS: ORAL_TABLET | ORAL | 0 refills | Status: DC

## 2018-03-04 MED ORDER — AZITHROMYCIN 250 MG PO TABS: ORAL_TABLET | ORAL | 0 refills | Status: DC

## 2018-03-04 NOTE — Progress Notes (Signed)
BP 138/75 (BP Location: Right Arm, Patient Position: Sitting, Cuff Size: Normal)   Pulse 64   Temp (!) 97.4 F (36.3 C) (Oral)   Ht 5' 8.5" (1.74 m)   Wt 162 lb (73.5 kg)   SpO2 99%   BMI 24.27 kg/m    Subjective:    Patient ID: [name], female    DOB: 1940/04/18, 78 y.o.   MRN: [id number]  HPI: [name] is a 78 y.o. female presenting on 03/04/2018 for Cough (x 1 day. No OTC meds used); Nasal Congestion (runny nose ); Headache (frontal facial pain/pressure); and Otalgia (right ear pain)   HPI Cough and nasal congestion and headache and facial pressure that has been going on since yesterday is what brings the patient in complaining today.  She also complains of right ear pain that is been going on since yesterday.  Patient denies any fevers or chills or shortness of breath or wheezing.  She does say that it is starting to get down into her chest like it is heading towards the wheezing.  Patient says it has been worsening since yesterday and that is why she came in.  Relevant past medical, surgical, family and social history reviewed and updated as indicated. Interim medical history since our last visit reviewed. Allergies and medications reviewed and updated.  Review of Systems  Constitutional: Negative for chills and fever.  HENT: Positive for congestion, postnasal drip, rhinorrhea, sinus pressure, sneezing and sore throat. Negative for ear discharge and ear pain.   Eyes: Negative for pain, redness and visual disturbance.  Respiratory: Positive for cough. Negative for chest tightness, shortness of breath and wheezing.   Cardiovascular: Negative for chest pain and leg swelling.  Musculoskeletal: Negative for back pain and gait problem.  Skin: Negative for rash.  Neurological: Negative for light-headedness and headaches.  Psychiatric/Behavioral: Negative for agitation and behavioral problems.  All other systems reviewed and are negative.   Per HPI unless specifically  indicated above   Allergies as of 03/04/2018      Reactions   Atorvastatin Other (See Comments)   Muscle cramps   Meperidine Hcl Nausea And Vomiting   Makes me 'deathly' sick   Statins Other (See Comments)   REACTION: Patient cannot tolerate due cramps   Norco [hydrocodone-acetaminophen] Nausea And Vomiting   Sulfonamide Derivatives Other (See Comments)   Unknown reaction      Medication List       Accurate as of March 04, 2018 11:45 AM. Always use your most recent med list.        alendronate 70 MG tablet Commonly known as:  FOSAMAX Take 1 tablet (70 mg total) by mouth every 7 (seven) days. Take with a full glass of water on an empty stomach.   aspirin 81 MG tablet Take 81 mg by mouth 3 (three) times a week.   azithromycin 250 MG tablet Commonly known as:  ZITHROMAX Take 2 the first day and then one each day after.   busPIRone 30 MG tablet Commonly known as:  BUSPAR TAKE (1/2) TO (1) TABLET BY MOUTH ONCE DAILY AS NEEDED   Calcium 500/D 500-125 MG-UNIT Tabs Generic drug:  Calcium Carbonate-Vitamin D Take 1 capsule by mouth daily.   donepezil 10 MG tablet Commonly known as:  ARICEPT Take 1 tablet (10 mg total) by mouth at bedtime.   ezetimibe 10 MG tablet Commonly known as:  ZETIA TAKE 1 TABLET BY MOUTH ONCE DAILY.(Needs to be seen before next refill)   Fish  Oil 1000 MG Caps Take 1 capsule by mouth daily.   gabapentin 300 MG capsule Commonly known as:  NEURONTIN One capsule in the morning and 2 at night   HYDROcodone-acetaminophen 5-325 MG tablet Commonly known as:  NORCO/VICODIN Take 1-2 tablets by mouth every 6 (six) hours as needed.   ibuprofen 200 MG tablet Commonly known as:  ADVIL,MOTRIN Take 800 mg by mouth every 6 (six) hours as needed (Pain).   levothyroxine 112 MCG tablet Commonly known as:  SYNTHROID, LEVOTHROID Take 1 tablet (112 mcg total) by mouth daily before breakfast.   lisinopril-hydrochlorothiazide 10-12.5 MG tablet Commonly  known as:  PRINZIDE,ZESTORETIC TAKE 1 TAB BY MOUTH DAILY   multivitamin capsule Take 1 capsule by mouth daily.   predniSONE 20 MG tablet Commonly known as:  DELTASONE 2 po at same time daily for 5 days   SENOKOT S 8.6-50 MG tablet Generic drug:  senna-docusate Take 1 tablet by mouth daily as needed for constipation.   VITAMIN B 12 PO Take 1 tablet by mouth daily.          Objective:    BP 138/75 (BP Location: Right Arm, Patient Position: Sitting, Cuff Size: Normal)   Pulse 64   Temp (!) 97.4 F (36.3 C) (Oral)   Ht 5' 8.5" (1.74 m)   Wt 162 lb (73.5 kg)   SpO2 99%   BMI 24.27 kg/m   Wt Readings from Last 3 Encounters:  03/04/18 162 lb (73.5 kg)  02/24/18 164 lb (74.4 kg)  11/16/17 159 lb (72.1 kg)    Physical Exam Vitals signs reviewed.  Constitutional:      General: She is not in acute distress.    Appearance: She is well-developed. She is not diaphoretic.  HENT:     Right Ear: Tympanic membrane, ear canal and external ear normal.     Left Ear: Tympanic membrane, ear canal and external ear normal.     Nose: Mucosal edema and rhinorrhea present.     Right Sinus: No maxillary sinus tenderness or frontal sinus tenderness.     Left Sinus: No maxillary sinus tenderness or frontal sinus tenderness.     Mouth/Throat:     Pharynx: Uvula midline. Posterior oropharyngeal erythema present. No oropharyngeal exudate.     Tonsils: No tonsillar abscesses.  Eyes:     Conjunctiva/sclera: Conjunctivae normal.  Cardiovascular:     Rate and Rhythm: Normal rate and regular rhythm.     Heart sounds: Normal heart sounds. No murmur.  Pulmonary:     Effort: Pulmonary effort is normal. No respiratory distress.     Breath sounds: Rhonchi present. No wheezing or rales.  Musculoskeletal: Normal range of motion.        General: No tenderness.  Skin:    General: Skin is warm and dry.     Findings: No rash.  Neurological:     Mental Status: She is alert and oriented to person,  place, and time.     Coordination: Coordination normal.  Psychiatric:        Behavior: Behavior normal.         Assessment & Plan:   Problem List Items Addressed This Visit    None    Visit Diagnoses    COPD exacerbation ([place])    -  Primary   Relevant Medications   azithromycin (ZITHROMAX) 250 MG tablet   predniSONE (DELTASONE) 20 MG tablet   Cough       Relevant Orders   Veritor Flu A/B  Waived (Completed)       Follow up plan: Return if symptoms worsen or fail to improve.  Counseling provided for all of the vaccine components Orders Placed This Encounter  Procedures  . Veritor Flu A/B [place], MD [facility] 03/04/2018, 11:45 AM

## 2018-03-06 ENCOUNTER — Other Ambulatory Visit: Payer: Medicare Other

## 2018-03-06 DIAGNOSIS — Z1211 Encounter for screening for malignant neoplasm of colon: Secondary | ICD-10-CM | POA: Diagnosis not present

## 2018-03-06 LAB — VERITOR FLU A/B WAIVED
INFLUENZA B: NEGATIVE
Influenza A: NEGATIVE

## 2018-03-07 LAB — FECAL OCCULT BLOOD, IMMUNOCHEMICAL: Fecal Occult Bld: NEGATIVE

## 2018-03-09 ENCOUNTER — Other Ambulatory Visit (HOSPITAL_COMMUNITY): Payer: Self-pay | Admitting: Family Medicine

## 2018-03-09 ENCOUNTER — Ambulatory Visit (HOSPITAL_COMMUNITY)
Admission: RE | Admit: 2018-03-09 | Discharge: 2018-03-09 | Disposition: A | Discharge status: Home / Self Care | Payer: Medicare Other | Source: Ambulatory Visit | Attending: Family Medicine | Admitting: Family Medicine

## 2018-03-09 ENCOUNTER — Encounter (HOSPITAL_COMMUNITY): Payer: Self-pay

## 2018-03-09 DIAGNOSIS — Z1231 Encounter for screening mammogram for malignant neoplasm of breast: Secondary | ICD-10-CM | POA: Insufficient documentation

## 2018-03-13 DIAGNOSIS — C50912 Malignant neoplasm of unspecified site of left female breast: Secondary | ICD-10-CM | POA: Diagnosis not present

## 2018-03-16 ENCOUNTER — Other Ambulatory Visit: Payer: Self-pay | Admitting: Family Medicine

## 2018-04-25 ENCOUNTER — Other Ambulatory Visit: Payer: Self-pay | Admitting: *Deleted

## 2018-04-25 MED ORDER — ALENDRONATE SODIUM 70 MG PO TABS: 70.0000 mg | ORAL_TABLET | ORAL | 2 refills | Status: DC

## 2018-04-25 MED ORDER — LISINOPRIL-HYDROCHLOROTHIAZIDE 10-12.5 MG PO TABS: 1.0000 | ORAL_TABLET | Freq: Every day | ORAL | 1 refills | Status: DC

## 2018-05-17 ENCOUNTER — Other Ambulatory Visit: Payer: Self-pay | Admitting: Family Medicine

## 2018-06-08 ENCOUNTER — Telehealth: Payer: Self-pay | Admitting: Family Medicine

## 2018-06-08 NOTE — Telephone Encounter (Signed)
appt made

## 2018-06-08 NOTE — Telephone Encounter (Signed)
For review

## 2018-06-09 ENCOUNTER — Telehealth: Payer: Self-pay | Admitting: Family Medicine

## 2018-06-09 ENCOUNTER — Encounter: Payer: Self-pay | Admitting: Family Medicine

## 2018-06-09 ENCOUNTER — Other Ambulatory Visit: Payer: Self-pay

## 2018-06-09 ENCOUNTER — Ambulatory Visit (INDEPENDENT_AMBULATORY_CARE_PROVIDER_SITE_OTHER): Payer: Medicare Other | Admitting: Family Medicine

## 2018-06-09 VITALS — BP 136/67 | HR 58 | Temp 98.8°F | Ht 68.0 in | Wt 155.0 lb

## 2018-06-09 DIAGNOSIS — R5383 Other fatigue: Secondary | ICD-10-CM

## 2018-06-09 DIAGNOSIS — R531 Weakness: Secondary | ICD-10-CM | POA: Diagnosis not present

## 2018-06-09 DIAGNOSIS — R202 Paresthesia of skin: Secondary | ICD-10-CM | POA: Diagnosis not present

## 2018-06-09 DIAGNOSIS — R42 Dizziness and giddiness: Secondary | ICD-10-CM | POA: Diagnosis not present

## 2018-06-09 DIAGNOSIS — R1084 Generalized abdominal pain: Secondary | ICD-10-CM

## 2018-06-09 DIAGNOSIS — R410 Disorientation, unspecified: Secondary | ICD-10-CM | POA: Diagnosis not present

## 2018-06-09 NOTE — Progress Notes (Signed)
Subjective:  Patient ID: [name], female    DOB: [birth year], 78 y.o.   MRN: [id number]  Chief Complaint:  Fatigue and Abdominal Pain   HPI: [name] is a 78 y.o. female presenting on 06/09/2018 for Fatigue and Abdominal Pain  Pt presents today for ongoing fatigue, bilateral arm and leg paresthesias, intermittent dizziness, hyperventilation, and generalized weakness. Pt states this started over a year ago. States at times the symptoms are more pronounced. States she has been told this could be anxiety related. She denies anxiety. She states she does not have a problem with anxiety. She states she has these episodes at random times. States she will be fine and then she starts breathing fast and then her hands become tight and tingly. She denies chest pain or shortness of breath. States she does have palpitations at times. No syncope. No focal deficits. No loss of function. She states she does have intermittent lower abdominal pain with these episodes and is concerned she has cancer. She would like to be referred to GI for evaluation.   Relevant past medical, surgical, family, and social history reviewed and updated as indicated.  Allergies and medications reviewed and updated.   Past Medical History:  Diagnosis Date  . Anal or rectal pain   . Anxiety state, unspecified   . Arthritis   . Arthropathy, unspecified, site unspecified   . Carpal tunnel syndrome    Bilateral  . Cataract   . Chronic airway obstruction, not elsewhere classified    patient denies on 02/27/14  . Esophageal reflux   . Esophageal stricture   . Hypertension   . Internal hemorrhoids without mention of complication   . Malignant neoplasm of breast (female), unspecified site    left breast   . Memory disorder 09/24/2013  . Osteopenia   . Other and unspecified hyperlipidemia   . Pain in joint, multiple sites   . Personal history of colonic polyps    adenomatous  . Polyneuropathy in other diseases  classified elsewhere ([place]) 09/24/2013  . Shingles   . Sleep apnea    cpap   . Unspecified hypothyroidism     Past Surgical History:  Procedure Laterality Date  . APPENDECTOMY  03/2014  . BACK SURGERY     X2  . BREAST LUMPECTOMY    . EYE SURGERY    . LAPAROSCOPIC APPENDECTOMY N/A 02/28/2014   Procedure: APPENDECTOMY LAPAROSCOPIC PARTIAL CECECTOMY FOR MUCOCELE OF APPENDIX ;  Surgeon: Michael Boston, MD;  Location: [facility];  Service: General;  Laterality: N/A;  . MASTECTOMY     left  . THYROIDECTOMY    . TUBAL LIGATION      Social History   Socioeconomic History  . Marital status: Divorced    Spouse name: Not on file  . Number of children: 2  . Years of education: Not on file  . Highest education level: Not on file  Occupational History  . Occupation: Retired    Fish farm manager: RETIRED  Social Needs  . Financial resource strain: Not hard at all  . Food insecurity:    Worry: Never true    Inability: Never true  . Transportation needs:    Medical: No    Non-medical: No  Tobacco Use  . Smoking status: Never Smoker  . Smokeless tobacco: Never Used  Substance and Sexual Activity  . Alcohol use: No    Alcohol/week: 0.0 standard drinks  . Drug use: No  . Sexual activity: Never  Lifestyle  .  Physical activity:    Days per week: 4 days    Minutes per session: 20 min  . Stress: Not at all  Relationships  . Social connections:    Talks on phone: More than three times a week    Gets together: More than three times a week    Attends religious service: More than 4 times per year    Active member of club or organization: Yes    Attends meetings of clubs or organizations: More than 4 times per year    Relationship status: Divorced  . Intimate partner violence:    Fear of current or ex partner: Not on file    Emotionally abused: Not on file    Physically abused: Not on file    Forced sexual activity: Not on file  Other Topics Concern  . Not on file  Social History Narrative    Patient lives at home alone and she is divorced.   Retired.   Education business course.   Right handed.   Caffeine sometimes tea.     Outpatient Encounter Medications as of 06/09/2018  Medication Sig  . aspirin 81 MG tablet Take 81 mg by mouth 3 (three) times a week.   . Calcium Carbonate-Vitamin D (CALCIUM 500/D) 500-125 MG-UNIT TABS Take 1 capsule by mouth daily.    . Cyanocobalamin (VITAMIN B 12 PO) Take 1 tablet by mouth daily.   Marland Kitchen donepezil (ARICEPT) 10 MG tablet Take 1 tablet (10 mg total) by mouth at bedtime.  Marland Kitchen ezetimibe (ZETIA) 10 MG tablet TAKE 1 TABLET BY MOUTH ONCE DAILY.  Marland Kitchen gabapentin (NEURONTIN) 300 MG capsule One capsule in the morning and 2 at night  . ibuprofen (ADVIL,MOTRIN) 200 MG tablet Take 800 mg by mouth every 6 (six) hours as needed (Pain).   Marland Kitchen levothyroxine (SYNTHROID, LEVOTHROID) 112 MCG tablet Take 1 tablet (112 mcg total) by mouth daily before breakfast.  . lisinopril-hydrochlorothiazide (ZESTORETIC) 10-12.5 MG tablet Take 1 tablet by mouth daily.  . Multiple Vitamin (MULTIVITAMIN) capsule Take 1 capsule by mouth daily.    . Omega-3 Fatty Acids (FISH OIL) 1000 MG CAPS Take 1 capsule by mouth daily.    Marland Kitchen senna-docusate (SENOKOT S) 8.6-50 MG per tablet Take 1 tablet by mouth daily as needed for constipation.  . busPIRone (BUSPAR) 30 MG tablet TAKE (1/2) TO (1) TABLET BY MOUTH ONCE DAILY AS NEEDED (Patient not taking: Reported on 06/09/2018)  . [DISCONTINUED] alendronate (FOSAMAX) 70 MG tablet Take 1 tablet (70 mg total) by mouth every 7 (seven) days. Take with a full glass of water on an empty stomach.  . [DISCONTINUED] azithromycin (ZITHROMAX) 250 MG tablet Take 2 the first day and then one each day after.  . [DISCONTINUED] HYDROcodone-acetaminophen (NORCO/VICODIN) 5-325 MG tablet Take 1-2 tablets by mouth every 6 (six) hours as needed.  . [DISCONTINUED] predniSONE (DELTASONE) 20 MG tablet 2 po at same time daily for 5 days   No facility-administered encounter  medications on file as of 06/09/2018.     Allergies  Allergen Reactions  . Atorvastatin Other (See Comments)    Muscle cramps  . Meperidine Hcl Nausea And Vomiting    Makes me 'deathly' sick  . Statins Other (See Comments)    REACTION: Patient cannot tolerate due cramps  . Norco [Hydrocodone-Acetaminophen] Nausea And Vomiting  . Sulfonamide Derivatives Other (See Comments)    Unknown reaction    Review of Systems  Constitutional: Positive for fatigue. Negative for activity change, appetite change, chills, diaphoresis,  fever and unexpected weight change.  Eyes: Negative for photophobia and visual disturbance.  Respiratory: Negative for apnea, cough, choking, chest tightness, shortness of breath, wheezing and stridor.   Cardiovascular: Positive for palpitations. Negative for chest pain and leg swelling.  Gastrointestinal: Positive for abdominal pain. Negative for abdominal distention, anal bleeding, blood in stool, constipation, diarrhea, nausea, rectal pain and vomiting.  Endocrine: Negative for cold intolerance, heat intolerance, polydipsia, polyphagia and polyuria.  Genitourinary: Negative for decreased urine volume, difficulty urinating, dysuria, flank pain, frequency, hematuria and urgency.  Musculoskeletal: Negative for arthralgias, back pain, gait problem, joint swelling, myalgias, neck pain and neck stiffness.  Skin: Negative for color change, pallor, rash and wound.  Neurological: Positive for dizziness, tremors, weakness and light-headedness. Negative for seizures, syncope, facial asymmetry, speech difficulty, numbness and headaches.       Paresthesia in arms and legs  Hematological: Negative for adenopathy. Does not bruise/bleed easily.  Psychiatric/Behavioral: Negative for confusion and sleep disturbance.  All other systems reviewed and are negative.       Objective:  BP 136/67   Pulse (!) 58   Temp 98.8 F (37.1 C) (Oral)   Ht _0  (1.727 m)   Wt 155 lb (70.3 kg)    BMI 23.57 kg/m    Wt Readings from Last 3 Encounters:  06/09/18 155 lb (70.3 kg)  03/04/18 162 lb (73.5 kg)  02/24/18 164 lb (74.4 kg)    Physical Exam Vitals signs and nursing note reviewed.  Constitutional:      General: She is not in acute distress.    Appearance: Normal appearance. She is well-developed and well-groomed. She is not ill-appearing, toxic-appearing or diaphoretic.  HENT:     Head: Normocephalic and atraumatic.     Jaw: There is normal jaw occlusion.     Right Ear: Hearing normal.     Left Ear: Hearing normal.     Nose: Nose normal.     Mouth/Throat:     Lips: Pink.     Mouth: Mucous membranes are moist.     Pharynx: Oropharynx is clear. Uvula midline.  Eyes:     General: Lids are normal.     Extraocular Movements: Extraocular movements intact.     Conjunctiva/sclera: Conjunctivae normal.     Pupils: Pupils are equal, round, and reactive to light.  Neck:     Musculoskeletal: Normal range of motion and neck supple.     Thyroid: No thyroid mass, thyromegaly or thyroid tenderness.     Vascular: No carotid bruit or JVD.     Trachea: Trachea and phonation normal.  Cardiovascular:     Rate and Rhythm: Normal rate and regular rhythm.     Chest Wall: PMI is not displaced.     Pulses: Normal pulses.     Heart sounds: Normal heart sounds. No murmur. No friction rub. No gallop.   Pulmonary:     Effort: Pulmonary effort is normal. No respiratory distress.     Breath sounds: Normal breath sounds. No wheezing.  Abdominal:     General: Abdomen is protuberant. Bowel sounds are normal. There is no distension or abdominal bruit.     Palpations: Abdomen is soft. There is no hepatomegaly or splenomegaly.     Tenderness: There is no abdominal tenderness. There is no right CVA tenderness or left CVA tenderness.     Hernia: No hernia is present.  Musculoskeletal: Normal range of motion.     Right lower leg: No edema.  Left lower leg: No edema.  Lymphadenopathy:      Cervical: No cervical adenopathy.  Skin:    General: Skin is warm and dry.     Capillary Refill: Capillary refill takes less than 2 seconds.     Coloration: Skin is not cyanotic, jaundiced or pale.     Findings: No rash.  Neurological:     General: No focal deficit present.     Mental Status: She is alert and oriented to person, place, and time.     Cranial Nerves: Cranial nerves are intact. No cranial nerve deficit.     Sensory: Sensation is intact. No sensory deficit.     Motor: Motor function is intact. No weakness.     Coordination: Coordination is intact. Coordination normal.     Gait: Gait is intact. Gait normal.     Deep Tendon Reflexes: Reflexes are normal and symmetric. Reflexes normal.  Psychiatric:        Attention and Perception: Attention and perception normal.        Mood and Affect: Mood and affect normal.        Speech: Speech normal.        Behavior: Behavior normal. Behavior is cooperative.        Thought Content: Thought content normal.        Cognition and Memory: Cognition and memory normal.        Judgment: Judgment normal.     Results for orders placed or performed in visit on 03/06/18  Fecal occult blood, imunochemical  Result Value Ref Range   Fecal Occult Bld Negative Negative       Pertinent labs & imaging results that were available during my care of the patient were reviewed by me and considered in my medical decision making.  Assessment & Plan:  [name] was seen today for fatigue and abdominal pain.  Diagnoses and all orders for this visit:  Dizziness Ongoing symptoms concerning for GAD. Pt reluctant to accept a diagnosis of anxiety. No syncope or focal deficits. Will check labs today. Reevaluate in 2 weeks.  -     CMP14+EGFR -     CBC with Differential/Platelet -     Thyroid Panel With TSH  Paresthesia of both hands Likely due to hyperventilation and anxiety. Will check labs today. Report any new or worsening symptoms.  -     Vitamin B12  -     VITAMIN D 25 Hydroxy (Vit-D Deficiency, Fractures)  General weakness Ongoing for over a year. Will check below.  -     Thyroid Panel With TSH -     Vitamin B12 -     VITAMIN D 25 Hydroxy (Vit-D Deficiency, Fractures)  Other fatigue Ongoing for over a year. Will check below.  -     Thyroid Panel With TSH -     VITAMIN D 25 Hydroxy (Vit-D Deficiency, Fractures)  Generalized abdominal pain Pt requesting referral to GI, referral made.  -     Ambulatory referral to Gastroenterology     Continue all other maintenance medications.  Follow up plan: Return in about 2 weeks (around 06/23/2018), or if symptoms worsen or fail to improve, for wekaness, fatigue.  Educational handout given for fatigue  The above assessment and management plan was discussed with the patient. The patient verbalized understanding of and has agreed to the management plan. Patient is aware to call the clinic if symptoms persist or worsen. Patient is aware when to return to the clinic for  a follow-up visit. Patient educated on when it is appropriate to go to the emergency department.   Monia Pouch, FNP-C [facility] [phone]

## 2018-06-09 NOTE — Telephone Encounter (Signed)
done

## 2018-06-09 NOTE — Telephone Encounter (Signed)
Ok to order 

## 2018-06-09 NOTE — Patient Instructions (Signed)

## 2018-06-10 LAB — CMP14+EGFR
ALT: 18 IU/L (ref 0–32)
AST: 22 IU/L (ref 0–40)
Albumin/Globulin Ratio: 2.3 — ABNORMAL HIGH (ref 1.2–2.2)
Albumin: 4.5 g/dL (ref 3.7–4.7)
Alkaline Phosphatase: 46 IU/L (ref 39–117)
BUN/Creatinine Ratio: 23 (ref 12–28)
BUN: 15 mg/dL (ref 8–27)
Bilirubin Total: 0.8 mg/dL (ref 0.0–1.2)
CO2: 27 mmol/L (ref 20–29)
Calcium: 9.3 mg/dL (ref 8.7–10.3)
Chloride: 103 mmol/L (ref 96–106)
Creatinine, Ser: 0.64 mg/dL (ref 0.57–1.00)
GFR calc Af Amer: 100 mL/min/{1.73_m2} (ref >59)
GFR calc non Af Amer: 86 mL/min/{1.73_m2} (ref >59)
Globulin, Total: 2 g/dL (ref 1.5–4.5)
Glucose: 93 mg/dL (ref 65–99)
Potassium: 4.1 mmol/L (ref 3.5–5.2)
Sodium: 142 mmol/L (ref 134–144)
Total Protein: 6.5 g/dL (ref 6.0–8.5)

## 2018-06-10 LAB — CBC WITH DIFFERENTIAL/PLATELET
Basophils Absolute: 0 10*3/uL (ref 0.0–0.2)
Basos: 1 %
EOS (ABSOLUTE): 0.2 10*3/uL (ref 0.0–0.4)
Eos: 5 %
Hematocrit: 40.4 % (ref 34.0–46.6)
Hemoglobin: 12.9 g/dL (ref 11.1–15.9)
Immature Grans (Abs): 0 10*3/uL (ref 0.0–0.1)
Immature Granulocytes: 0 %
Lymphocytes Absolute: 1.7 10*3/uL (ref 0.7–3.1)
Lymphs: 32 %
MCH: 30.1 pg (ref 26.6–33.0)
MCHC: 31.9 g/dL (ref 31.5–35.7)
MCV: 94 fL (ref 79–97)
Monocytes Absolute: 0.4 10*3/uL (ref 0.1–0.9)
Monocytes: 7 %
Neutrophils Absolute: 2.9 10*3/uL (ref 1.4–7.0)
Neutrophils: 55 %
Platelets: 225 10*3/uL (ref 150–450)
RBC: 4.28 x10E6/uL (ref 3.77–5.28)
RDW: 13.2 % (ref 11.7–15.4)
WBC: 5.2 10*3/uL (ref 3.4–10.8)

## 2018-06-10 LAB — VITAMIN B12: Vitamin B-12: 1831 pg/mL — ABNORMAL HIGH (ref 232–1245)

## 2018-06-10 LAB — THYROID PANEL WITH TSH
Free Thyroxine Index: 2.3 (ref 1.2–4.9)
T3 Uptake Ratio: 27 % (ref 24–39)
T4, Total: 8.5 ug/dL (ref 4.5–12.0)
TSH: 0.973 u[IU]/mL (ref 0.450–4.500)

## 2018-06-10 LAB — VITAMIN D 25 HYDROXY (VIT D DEFICIENCY, FRACTURES): Vit D, 25-Hydroxy: 40.5 ng/mL (ref 30.0–100.0)

## 2018-06-13 ENCOUNTER — Telehealth: Payer: Self-pay | Admitting: Family Medicine

## 2018-06-13 LAB — LYME AB/WESTERN BLOT REFLEX
LYME DISEASE AB, QUANT, IGM: <0.8 index (ref 0.00–0.79)
Lyme IgG/IgM Ab: <0.91 {ISR} (ref 0.00–0.90)

## 2018-06-13 LAB — SPECIMEN STATUS REPORT

## 2018-06-15 ENCOUNTER — Telehealth: Payer: Self-pay | Admitting: Family Medicine

## 2018-06-15 NOTE — Telephone Encounter (Signed)
PT is wanting to speak to you about transferring her care to another provider

## 2018-06-15 NOTE — Telephone Encounter (Signed)
Pt aware of next appt

## 2018-06-19 ENCOUNTER — Telehealth: Payer: Self-pay | Admitting: *Deleted

## 2018-06-19 NOTE — Telephone Encounter (Signed)
Due to current COVID 19 pandemic, our office is severely reducing in office visits until further notice, in order to minimize the risk to our patients and healthcare providers. Pt did not VV or daughter that would be able to be there.  I offered telephone visit but she stated to r/s later with MD.  I was able to r/s with MM/NP on 6-25/20 at 1100 in office visit, (asking for referral to vascular surgeon).  I relayed COVID 19 process (temperature taken, screening questions, stay in car till called on mobile).  She verbalized understanding.

## 2018-06-19 NOTE — Telephone Encounter (Signed)
noted 

## 2018-06-21 ENCOUNTER — Ambulatory Visit: Payer: Medicare Other | Admitting: Adult Health

## 2018-06-22 ENCOUNTER — Ambulatory Visit: Payer: Medicare Other | Admitting: Family Medicine

## 2018-06-29 ENCOUNTER — Encounter: Payer: Self-pay | Admitting: Neurology

## 2018-06-29 ENCOUNTER — Ambulatory Visit (INDEPENDENT_AMBULATORY_CARE_PROVIDER_SITE_OTHER): Payer: Medicare Other | Admitting: Neurology

## 2018-06-29 ENCOUNTER — Other Ambulatory Visit: Payer: Self-pay

## 2018-06-29 VITALS — BP 147/66 | HR 54 | Temp 97.8°F | Ht 69.0 in | Wt 158.0 lb

## 2018-06-29 DIAGNOSIS — G4762 Sleep related leg cramps: Secondary | ICD-10-CM

## 2018-06-29 DIAGNOSIS — G500 Trigeminal neuralgia: Secondary | ICD-10-CM | POA: Diagnosis not present

## 2018-06-29 DIAGNOSIS — G3184 Mild cognitive impairment, so stated: Secondary | ICD-10-CM

## 2018-06-29 HISTORY — DX: Sleep related leg cramps: G47.62

## 2018-06-29 MED ORDER — DULOXETINE HCL 20 MG PO CPEP: ORAL_CAPSULE | ORAL | 3 refills | Status: DC

## 2018-06-29 NOTE — Progress Notes (Signed)
Reason for visit: Headache, trigeminal neuralgia, peripheral neuropathy, mild cognitive impairment  [name] is an 78 y.o. female  History of present illness:  Ms. [name] is a 78 year old right-handed white female with a history of mild cognitive impairment.  The patient has been on Aricept, she tolerates the medication fairly well, she has not noted a lot of change in her memory issues over the last 6 months.  She continues to have episodes of right frontal and retro-orbital headaches that sometimes are sharp and jabbing in nature.  The patient has been on gabapentin taking 300 mg in the morning and 600 mg in the evening.  She was placed on Effexor on her last visit but she stopped the medication, the reasons are not completely clear.  The patient seems to have more headaches when the weather is hot.  She may have 2 or 3 such events a week.  The patient has also had a lot of nocturnal leg cramps, again 2 or 3 times a week.  She just started taking magnesium supplementation for this.  She returns this office for further evaluation.   Past Medical History:  Diagnosis Date  . Anal or rectal pain   . Anxiety state, unspecified   . Arthritis   . Arthropathy, unspecified, site unspecified   . Carpal tunnel syndrome    Bilateral  . Cataract   . Chronic airway obstruction, not elsewhere classified    patient denies on 02/27/14  . Esophageal reflux   . Esophageal stricture   . Hypertension   . Internal hemorrhoids without mention of complication   . Malignant neoplasm of breast (female), unspecified site    left breast   . Memory disorder 09/24/2013  . Osteopenia   . Other and unspecified hyperlipidemia   . Pain in joint, multiple sites   . Personal history of colonic polyps    adenomatous  . Polyneuropathy in other diseases classified elsewhere ([place]) 09/24/2013  . Shingles   . Sleep apnea    cpap   . Unspecified hypothyroidism     Past Surgical History:  Procedure Laterality  Date  . APPENDECTOMY  03/2014  . BACK SURGERY     X2  . BREAST LUMPECTOMY    . EYE SURGERY    . LAPAROSCOPIC APPENDECTOMY N/A 02/28/2014   Procedure: APPENDECTOMY LAPAROSCOPIC PARTIAL CECECTOMY FOR MUCOCELE OF APPENDIX ;  Surgeon: Michael Boston, MD;  Location: [facility];  Service: General;  Laterality: N/A;  . MASTECTOMY     left  . THYROIDECTOMY    . TUBAL LIGATION      Family History  Problem Relation Age of Onset  . Bipolar disorder Sister   . Early death Mother   . Early death Sister   . Colon cancer Neg Hx   . Esophageal cancer Neg Hx   . Rectal cancer Neg Hx   . Stomach cancer Neg Hx     Social history:  reports that she has never smoked. She has never used smokeless tobacco. She reports that she does not drink alcohol or use drugs.    Allergies  Allergen Reactions  . Atorvastatin Other (See Comments)    Muscle cramps  . Meperidine Hcl Nausea And Vomiting    Makes me 'deathly' sick  . Statins Other (See Comments)    REACTION: Patient cannot tolerate due cramps  . Norco [Hydrocodone-Acetaminophen] Nausea And Vomiting  . Sulfonamide Derivatives Other (See Comments)    Unknown reaction    Medications:  Prior  to Admission medications   Medication Sig Start Date End Date Taking? Authorizing Provider  aspirin 81 MG tablet Take 81 mg by mouth 3 (three) times a week.    Yes [provider]  busPIRone (BUSPAR) 30 MG tablet TAKE (1/2) TO (1) TABLET BY MOUTH ONCE DAILY AS NEEDED 02/23/16  Yes Chipper Herb, MD  Calcium Carbonate-Vitamin D (CALCIUM 500/D) 500-125 MG-UNIT TABS Take 1 capsule by mouth daily.     Yes [provider]  Cyanocobalamin (VITAMIN B 12 PO) Take 1 tablet by mouth daily.    Yes [provider]  donepezil (ARICEPT) 10 MG tablet Take 1 tablet (10 mg total) by mouth at bedtime. 10/14/17  Yes Kathrynn Ducking, MD  ezetimibe (ZETIA) 10 MG tablet TAKE 1 TABLET BY MOUTH ONCE DAILY. 05/18/18  Yes Chipper Herb, MD  gabapentin  (NEURONTIN) 300 MG capsule One capsule in the morning and 2 at night 10/14/17  Yes Kathrynn Ducking, MD  ibuprofen (ADVIL,MOTRIN) 200 MG tablet Take 800 mg by mouth every 6 (six) hours as needed (Pain).    Yes [provider]  levothyroxine (SYNTHROID, LEVOTHROID) 112 MCG tablet Take 1 tablet (112 mcg total) by mouth daily before breakfast. 02/24/18  Yes Chipper Herb, MD  lisinopril-hydrochlorothiazide (ZESTORETIC) 10-12.5 MG tablet Take 1 tablet by mouth daily. 04/25/18  Yes Chipper Herb, MD  Multiple Vitamin (MULTIVITAMIN) capsule Take 1 capsule by mouth daily.     Yes [provider]  Omega-3 Fatty Acids (FISH OIL) 1000 MG CAPS Take 1 capsule by mouth daily.     Yes [provider]  senna-docusate (SENOKOT S) 8.6-50 MG per tablet Take 1 tablet by mouth daily as needed for constipation.   Yes [provider]    ROS:  Out of a complete 14 system review of symptoms, the patient complains only of the following symptoms, and all other reviewed systems are negative.  Headache Mild memory problems  Blood pressure (!) 147/66, pulse (!) 54, temperature 97.8 F (36.6 C), temperature source Temporal, height 5\' 9"  (1.753 m), weight 158 lb (71.7 kg).  Physical Exam  General: The patient is alert and cooperative at the time of the examination.  Skin: No significant peripheral edema is noted.   Neurologic Exam  Mental status: The patient is alert and oriented x 3 at the time of the examination. The patient has apparent normal recent and remote memory, with an apparently normal attention span and concentration ability.  Mini-Mental status examination done today shows a total score 29/30.   Cranial nerves: Facial symmetry is present. Speech is normal, no aphasia or dysarthria is noted. Extraocular movements are full. Visual fields are full.  Motor: The patient has good strength in all 4 extremities.  Sensory examination: Soft touch sensation is symmetric  on the face, arms, and legs.  Coordination: The patient has good finger-nose-finger and heel-to-shin bilaterally.  Gait and station: The patient has a normal gait. Tandem gait is minimally unsteady. Romberg is negative. No drift is seen.  Reflexes: Deep tendon reflexes are symmetric.   Assessment/Plan:  1.  Mild cognitive impairment  2.  Nocturnal leg cramps  3.  Right frontal and retro-orbital headache  4.  Trigeminal neuralgia  5.  Peripheral neuropathy  The patient will continue her gabapentin, will add Cymbalta taking 20 mg at night for a week and then go to 20 mg twice daily.  Hopefully this will help her peripheral neuropathy pain and her headache.  The patient is to go on magnesium supplementation for her nocturnal leg cramps, if this is not helpful, we may try baclofen in the future.  She will follow-up here in 6 months.  Jill Alexanders MD 06/29/2018 11:09 AM  [facility] [address] [place], [place] [address]  Phone [phone] Fax [phone]

## 2018-06-29 NOTE — Patient Instructions (Signed)
We will start Cymbalta for the neuropathy and for the headache, call for any dose adjustments.   Cymbalta (duloxetine) is an antidepressant medication that is commonly used for peripheral neuropathy pain or for fibromyalgia pain. As with any antidepressant medication, worsening depression can be seen. This medication can potentially cause headache, dizziness, sexual dysfunction, or nausea. If any problems are noted on this medication, please contact our office.

## 2018-06-30 IMAGING — DX DG SHOULDER 2+V*R*
3 series · 3 of 3 positions shown · non-contrast
Comparison: Chest x-ray 02/11/2016

CLINICAL DATA: Shoulder pain

EXAM:
RIGHT SHOULDER - 2+ VIEW

[shoulder ap]
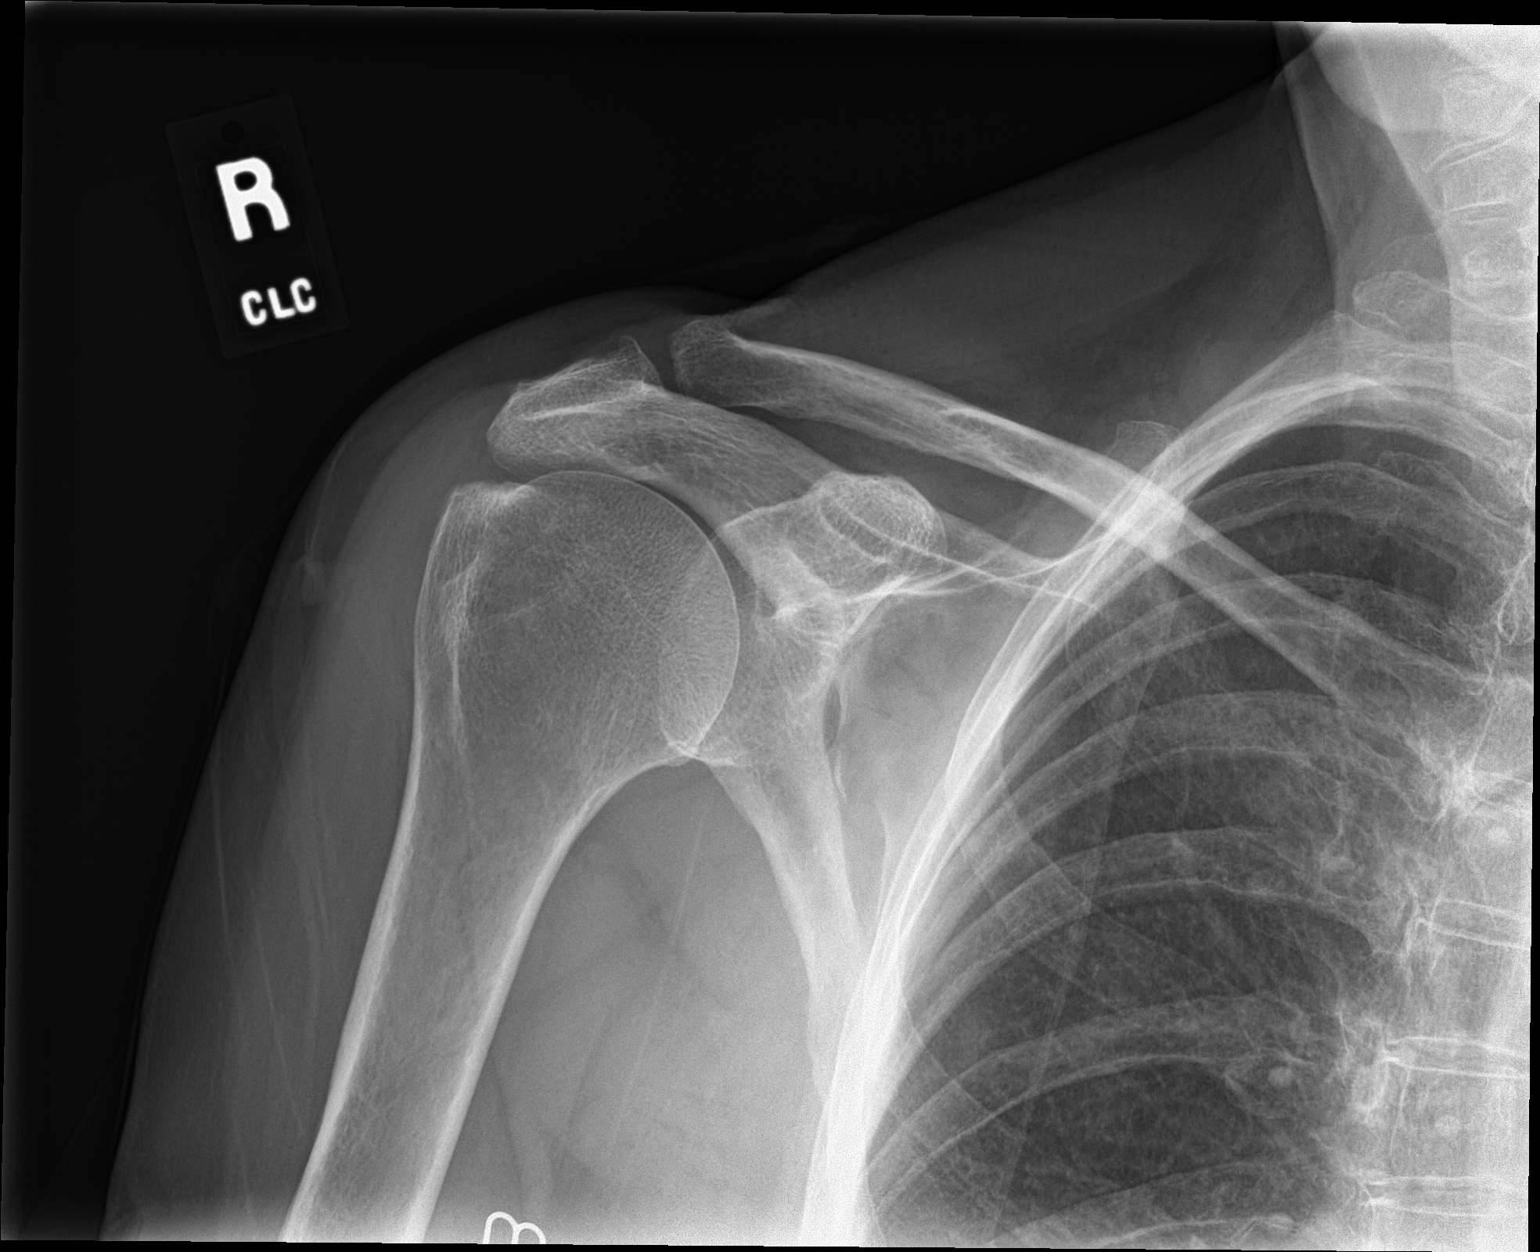

[shoulder obl]
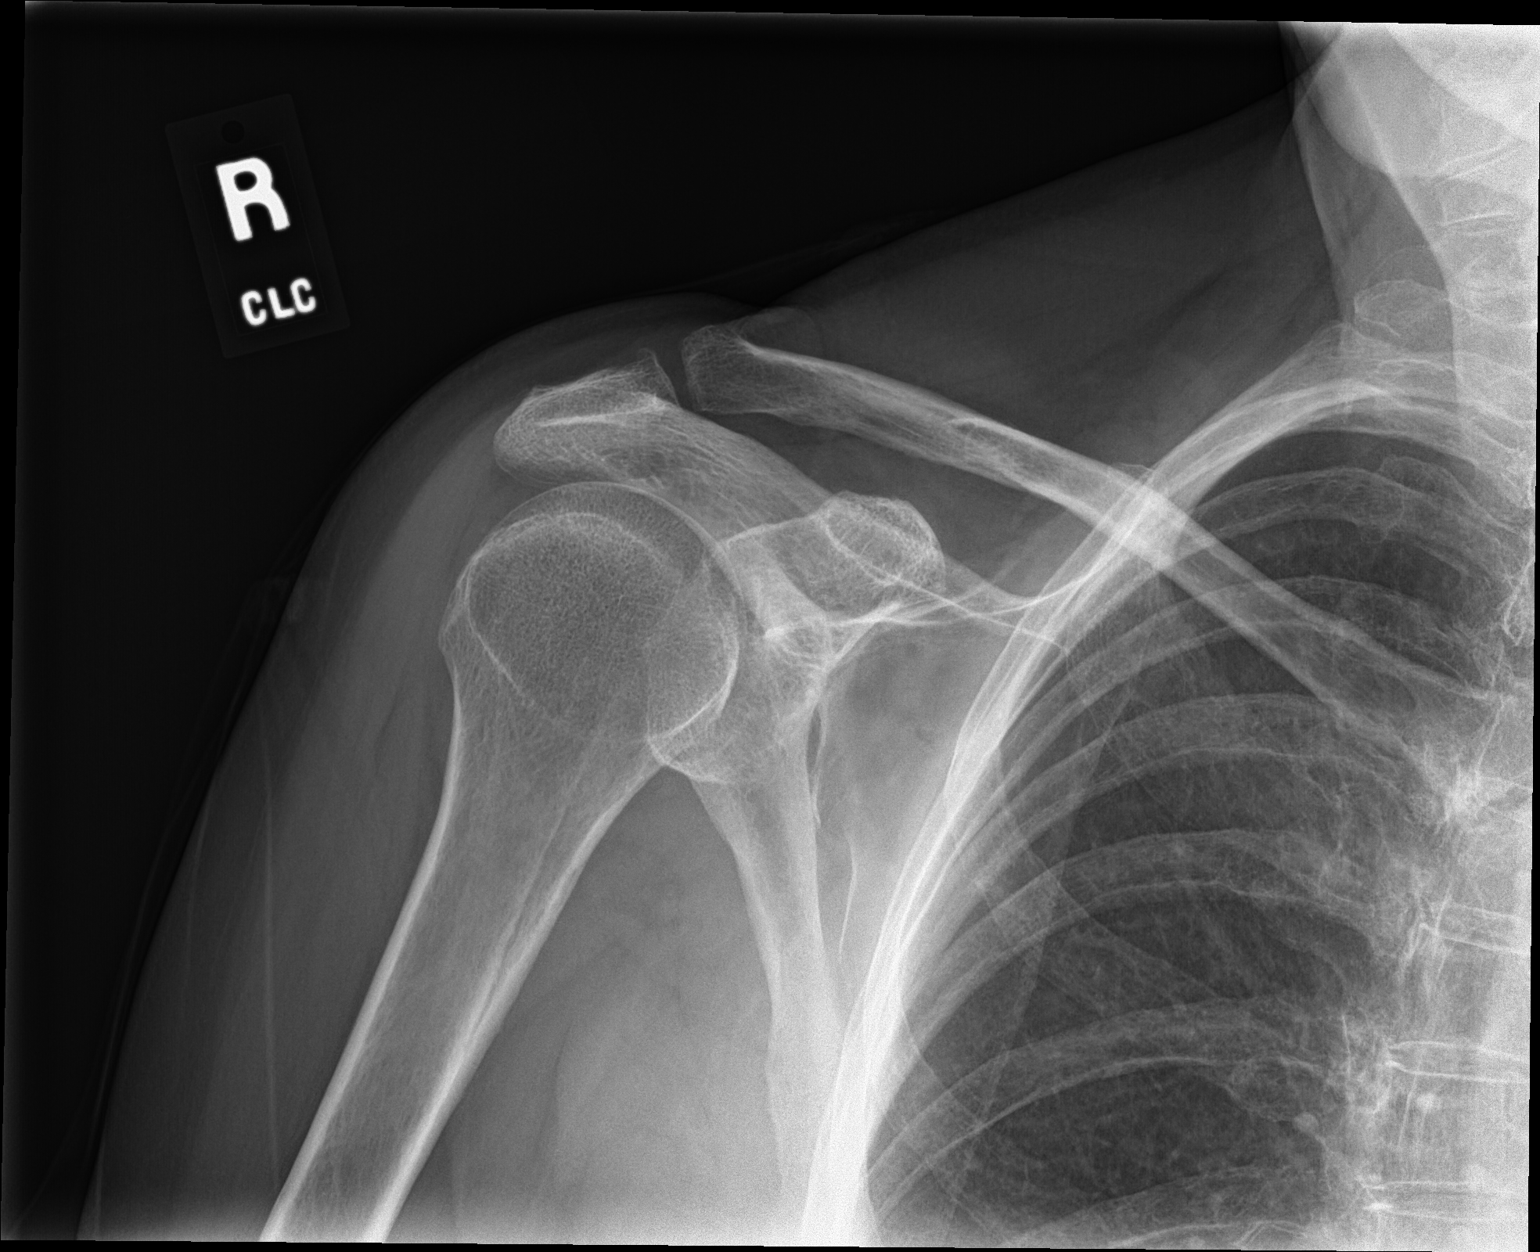

[shoulder axial]
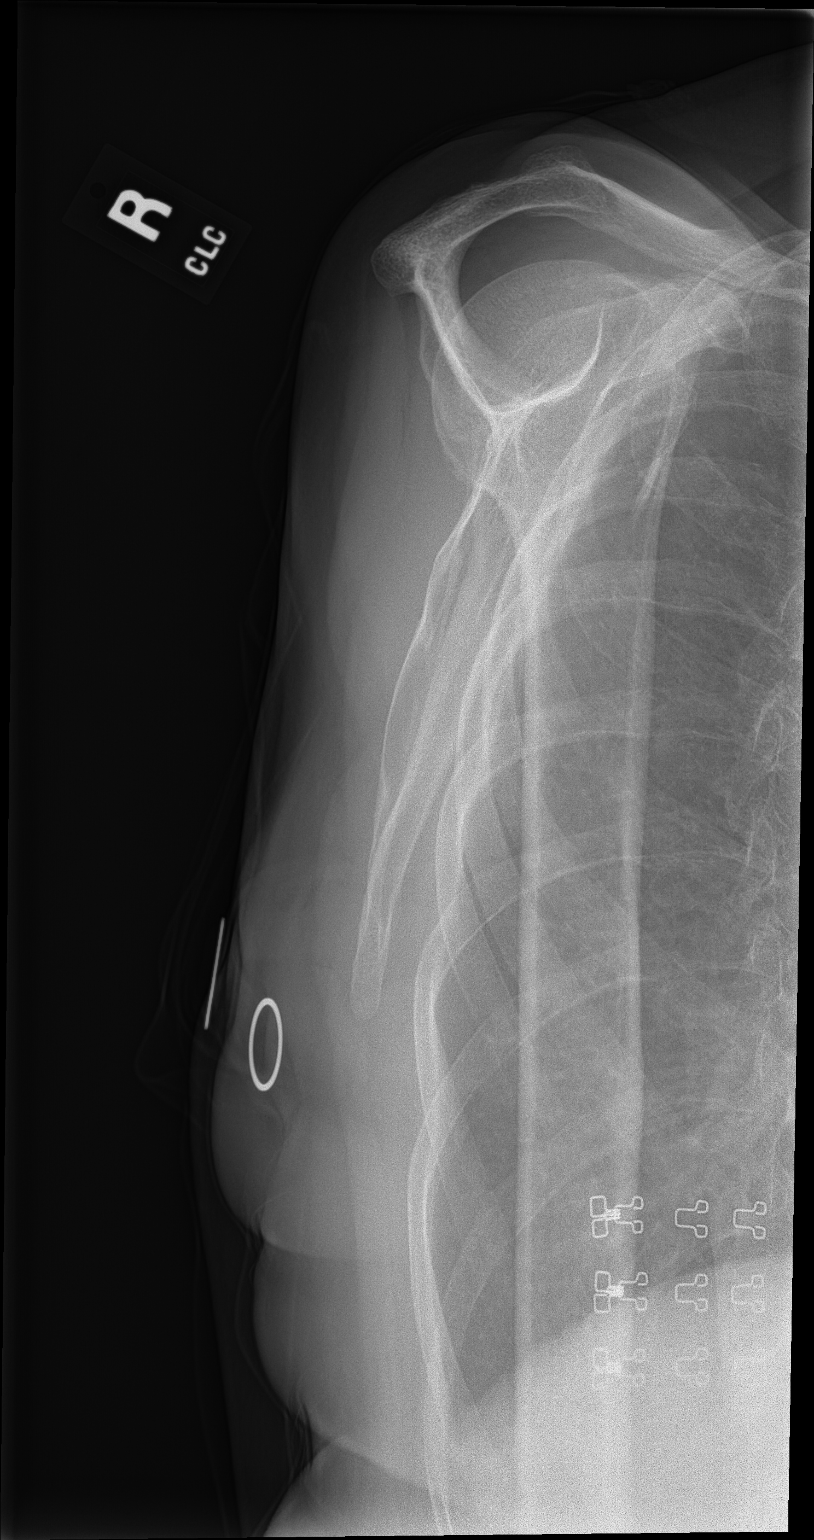

[3 of 3 positions shown; findings below may reference images not displayed]

FINDINGS: There is no evidence of fracture or dislocation. There is no
evidence of arthropathy or other focal bone abnormality. Soft
tissues are unremarkable.
IMPRESSION: Negative.

## 2018-07-05 ENCOUNTER — Encounter: Payer: Self-pay | Admitting: Family Medicine

## 2018-07-05 ENCOUNTER — Ambulatory Visit (INDEPENDENT_AMBULATORY_CARE_PROVIDER_SITE_OTHER): Payer: Medicare Other | Admitting: Family Medicine

## 2018-07-05 ENCOUNTER — Other Ambulatory Visit: Payer: Self-pay

## 2018-07-05 DIAGNOSIS — B029 Zoster without complications: Secondary | ICD-10-CM | POA: Diagnosis not present

## 2018-07-05 MED ORDER — VALACYCLOVIR HCL 1 G PO TABS: 1000.0000 mg | ORAL_TABLET | Freq: Two times a day (BID) | ORAL | 0 refills | Status: DC

## 2018-07-05 NOTE — Progress Notes (Signed)
Virtual Visit via telephone Note  I connected with [name] on 07/05/18 at 1120 by telephone and verified that I am speaking with the correct person using two identifiers. [name] is currently located at home and no other people are currently with her during visit. The provider, Fransisca Kaufmann Dilana Mcphie, MD is located in their office at time of visit.  Call ended at 1126  I discussed the limitations, risks, security and privacy concerns of performing an evaluation and management service by telephone and the availability of in person appointments. I also discussed with the patient that there may be a patient responsible charge related to this service. The patient expressed understanding and agreed to proceed.   History and Present Illness: Patient is calling in with shingles rash on the top and right of head.  She has rash and blisters on her hair line on the right side of head.  She is taking tylenol and benadryl and clobetasol liquid.  She is feeling like it is worsening.  She has had shingles on multiple occasions before and it feels like this is very similar.  No diagnosis found.  Outpatient Encounter Medications as of 07/05/2018  Medication Sig  . aspirin 81 MG tablet Take 81 mg by mouth 3 (three) times a week.   . busPIRone (BUSPAR) 30 MG tablet TAKE (1/2) TO (1) TABLET BY MOUTH ONCE DAILY AS NEEDED  . Calcium Carbonate-Vitamin D (CALCIUM 500/D) 500-125 MG-UNIT TABS Take 1 capsule by mouth daily.    . Cyanocobalamin (VITAMIN B 12 PO) Take 1 tablet by mouth daily.   Marland Kitchen donepezil (ARICEPT) 10 MG tablet Take 1 tablet (10 mg total) by mouth at bedtime.  . DULoxetine (CYMBALTA) 20 MG capsule One at night for 1 week, then take one twice a day  . ezetimibe (ZETIA) 10 MG tablet TAKE 1 TABLET BY MOUTH ONCE DAILY.  Marland Kitchen gabapentin (NEURONTIN) 300 MG capsule One capsule in the morning and 2 at night  . ibuprofen (ADVIL,MOTRIN) 200 MG tablet Take 800 mg by mouth every 6 (six) hours as needed  (Pain).   Marland Kitchen levothyroxine (SYNTHROID, LEVOTHROID) 112 MCG tablet Take 1 tablet (112 mcg total) by mouth daily before breakfast.  . lisinopril-hydrochlorothiazide (ZESTORETIC) 10-12.5 MG tablet Take 1 tablet by mouth daily.  . Multiple Vitamin (MULTIVITAMIN) capsule Take 1 capsule by mouth daily.    . Omega-3 Fatty Acids (FISH OIL) 1000 MG CAPS Take 1 capsule by mouth daily.    Marland Kitchen senna-docusate (SENOKOT S) 8.6-50 MG per tablet Take 1 tablet by mouth daily as needed for constipation.   No facility-administered encounter medications on file as of 07/05/2018.     Review of Systems  Constitutional: Negative for chills and fever.  Eyes: Negative for visual disturbance.  Respiratory: Negative for chest tightness and shortness of breath.   Cardiovascular: Negative for chest pain.  Skin: Positive for rash.  All other systems reviewed and are negative.   Observations/Objective: Patient sounds comfortable and in no acute distress  Assessment and Plan: Problem List Items Addressed This Visit    None    Visit Diagnoses    Herpes zoster without complication    -  Primary   Relevant Medications   valACYclovir (VALTREX) 1000 MG tablet       Follow Up Instructions: Patient is to take Valtrex and continue with Benadryl and Tylenol and use ibuprofen as needed.  Recommended for her to stop using the clobetasol ointment on it    I discussed the  assessment and treatment plan with the patient. The patient was provided an opportunity to ask questions and all were answered. The patient agreed with the plan and demonstrated an understanding of the instructions.   The patient was advised to call back or seek an in-person evaluation if the symptoms worsen or if the condition fails to improve as anticipated.  The above assessment and management plan was discussed with the patient. The patient verbalized understanding of and has agreed to the management plan. Patient is aware to call the clinic if symptoms  persist or worsen. Patient is aware when to return to the clinic for a follow-up visit. Patient educated on when it is appropriate to go to the emergency department.    I provided 6 minutes of non-face-to-face time during this encounter.    Worthy Rancher, MD

## 2018-07-11 ENCOUNTER — Telehealth: Payer: Self-pay | Admitting: *Deleted

## 2018-07-11 NOTE — Telephone Encounter (Signed)
Covid-19 screening questions   Do you now or have you had a fever in the last 14 days? NO   Do you have any respiratory symptoms of shortness of breath or cough now or in the last 14 days? NO  Do you have any family members or close contacts with diagnosed or suspected Covid-19 in the past 14 days? NO  Have you been tested for Covid-19 and found to be positive? NO        

## 2018-07-12 ENCOUNTER — Ambulatory Visit (INDEPENDENT_AMBULATORY_CARE_PROVIDER_SITE_OTHER): Payer: Medicare Other | Admitting: Internal Medicine

## 2018-07-12 ENCOUNTER — Other Ambulatory Visit: Payer: Self-pay

## 2018-07-12 ENCOUNTER — Encounter: Payer: Self-pay | Admitting: *Deleted

## 2018-07-12 ENCOUNTER — Ambulatory Visit: Payer: Medicare Other | Admitting: Internal Medicine

## 2018-07-12 VITALS — BP 98/58 | HR 57 | Temp 97.8°F | Ht 69.0 in | Wt 158.1 lb

## 2018-07-12 DIAGNOSIS — R152 Fecal urgency: Secondary | ICD-10-CM | POA: Diagnosis not present

## 2018-07-12 DIAGNOSIS — R195 Other fecal abnormalities: Secondary | ICD-10-CM | POA: Diagnosis not present

## 2018-07-12 DIAGNOSIS — R634 Abnormal weight loss: Secondary | ICD-10-CM | POA: Diagnosis not present

## 2018-07-12 DIAGNOSIS — R1030 Lower abdominal pain, unspecified: Secondary | ICD-10-CM | POA: Diagnosis not present

## 2018-07-12 DIAGNOSIS — Z86010 Personal history of colonic polyps: Secondary | ICD-10-CM

## 2018-07-12 DIAGNOSIS — R1310 Dysphagia, unspecified: Secondary | ICD-10-CM | POA: Diagnosis not present

## 2018-07-12 NOTE — Progress Notes (Signed)
Subjective:    Patient ID: [name], female    DOB: Nov 29, 1940, 78 y.o.   MRN: [id number]  HPI Gibson Telleria is a 78 year old female with a past medical history with GERD with hiatal hernia, prior dysphagia responsive to dilation, history of adenomatous colon polyp, submucosal lesion at the appendiceal orifice seen in February 2016 at the time of last colonoscopy status post surgical resection showing benign fibrous obliteration of the appendix, hypertension, hyperlipidemia, sleep apnea, hypothyroidism who is seen to evaluate dysphagia and change in bowel habit.  She is here alone today.  She was seen last in February 2016 at her colonoscopy performed for personal history of colon polyps.  This showed a submucosal bulge at the appendiceal orifice, a 5 mm hepatic flexure adenoma was removed.  Several weeks after this she had appendectomy with Dr. Johney Maine showing benign fibrous obliteration of the appendix.  She reports that she has been having alternating bowel habits.  She has been having loose stools at times which can be urgent and explosive.  It makes her scared to go anywhere.  Diet does not seem to affect it.  It can be associate with lower abdominal cramping.  She does not feel constipated.  She has not seen blood in her stool or melena.  She does note an approximate 30 pound weight loss in about 6 to 12 months.  She is noticed some swallowing difficulty particularly with solid food.  Food will feel like it stops in her mid and lower chest and cause discomfort.  She often will burp and belch when this occurs and at times the food will come back up.  She denies feeling heartburn.  She denies epigastric pain.  Denies nausea and vomiting.   Review of Systems As per HPI, otherwise negative  Current Medications, Allergies, Past Medical History, Past Surgical History, Family History and Social History were reviewed in [employer] record.     Objective:   Physical Exam BP  (!) 98/58 (BP Location: Right Arm, Patient Position: Sitting, Cuff Size: Normal)   Pulse (!) 57   Temp 97.8 F (36.6 C) (Other (Comment)) Comment (Src): head- thermoscan  Ht 5\' 9"  (1.753 m)   Wt 158 lb 2 oz (71.7 kg)   SpO2 95%   BMI 23.35 kg/m . Gen: awake, alert, NAD HEENT: anicteric, op clear CV: RRR, no mrg Pulm: CTA b/l Abd: soft, lower abdominal tenderness with mild guarding, no rebound , ND, +BS throughout Ext: no c/c/e Neuro: nonfocal  CBC    Component Value Date/Time   WBC 5.2 06/09/2018 0947   WBC 8.0 12/01/2015 1626   RBC 4.28 06/09/2018 0947   RBC 4.22 12/01/2015 1626   HGB 12.9 06/09/2018 0947   HGB 13.0 04/23/2013 1549   HCT 40.4 06/09/2018 0947   HCT 38.5 04/23/2013 1549   PLT 225 06/09/2018 0947   MCV 94 06/09/2018 0947   MCV 91.5 04/23/2013 1549   MCH 30.1 06/09/2018 0947   MCH 30.6 12/01/2015 1626   MCHC 31.9 06/09/2018 0947   MCHC 32.8 12/01/2015 1626   RDW 13.2 06/09/2018 0947   RDW 14.1 04/23/2013 1549   LYMPHSABS 1.7 06/09/2018 0947   LYMPHSABS 1.7 04/23/2013 1549   MONOABS 0.5 12/01/2015 1626   MONOABS 0.6 04/23/2013 1549   EOSABS 0.2 06/09/2018 0947   BASOSABS 0.0 06/09/2018 0947   BASOSABS 0.0 04/23/2013 1549   CMP     Component Value Date/Time   NA 142 06/09/2018 0947  NA 144 04/23/2013 1549   K 4.1 06/09/2018 0947   K 3.5 04/23/2013 1549   CL 103 06/09/2018 0947   CO2 27 06/09/2018 0947   CO2 25 04/23/2013 1549   GLUCOSE 93 06/09/2018 0947   GLUCOSE 94 12/01/2015 1626   GLUCOSE 97 04/23/2013 1549   BUN 15 06/09/2018 0947   BUN 20.1 04/23/2013 1549   CREATININE 0.64 06/09/2018 0947   CREATININE 0.7 04/23/2013 1549   CALCIUM 9.3 06/09/2018 0947   CALCIUM 10.3 04/23/2013 1549   PROT 6.5 06/09/2018 0947   PROT 6.8 04/23/2013 1549   ALBUMIN 4.5 06/09/2018 0947   ALBUMIN 4.1 04/23/2013 1549   AST 22 06/09/2018 0947   AST 31 04/23/2013 1549   ALT 18 06/09/2018 0947   ALT 32 04/23/2013 1549   ALKPHOS 46 06/09/2018 0947    ALKPHOS 74 04/23/2013 1549   BILITOT 0.8 06/09/2018 0947   BILITOT 0.53 04/23/2013 1549   GFRNONAA 86 06/09/2018 0947   GFRNONAA >89 07/10/2012 1056   GFRAA 100 06/09/2018 0947   GFRAA >89 07/10/2012 1056       Assessment & Plan:  78 year old female with a past medical history with GERD with hiatal hernia, prior dysphagia responsive to dilation, history of adenomatous colon polyp, submucosal lesion at the appendiceal orifice seen in February 2016 at the time of last colonoscopy status post surgical resection showing benign fibrous obliteration of the appendix, hypertension, hyperlipidemia, sleep apnea, hypothyroidism who is seen to evaluate dysphagia and change in bowel habit.   1.  Lower abdominal pain with fecal urgency and intermittent loose stool --I have recommended CT scan of the abdomen pelvis with contrast.  Also with weight loss CT is indicated.  Recent blood work particularly CBC and CMP were unremarkable.  If CT unrevealing then start Citrucel 1 working to 2 tablespoons daily to try to regulate bowel movements. --CT scan of the abdomen pelvis with contrast --If unremarkable Citrucel 1 tablespoon a day working to 2 tablespoons daily --Further recommendations if CT unremarkable and Citrucel/fiber supplementation fails to improve symptoms  2.  Dysphagia --history of GERD and hiatal hernia.  Prior dilation performed by Dr. Sharlett Iles effective at improving her dysphagia.  We will repeat upper endoscopy for probable dilation.  We discussed the risk, benefits and alternatives and she is agreeable and wishes to proceed --EGD in the [facility] with probable dilation  3.  History of colon polyp --surveillance colonoscopy would be recommended next year in February 2021

## 2018-07-12 NOTE — Patient Instructions (Addendum)
You have been scheduled for a CT scan of the abdomen and pelvis at [facility].   You are scheduled on Monday, 07/31/2018 at 8:00 am. You should arrive 15 minutes prior to your appointment time for registration. Please follow the written instructions below on the day of your exam:  WARNING: IF YOU ARE ALLERGIC TO IODINE/X-RAY DYE, PLEASE NOTIFY RADIOLOGY IMMEDIATELY AT [phone]! YOU WILL BE GIVEN A 13 HOUR PREMEDICATION PREP.  1) Do not eat or drink anything after 4:00 am (4 hours prior to your test) 2) You have been given 2 bottles of oral contrast to drink. The solution may taste better if refrigerated, but do NOT add ice or any other liquid to this solution. Shake well before drinking.    Drink 1 bottle of contrast @ 6:00 am (2 hours prior to your exam)  Drink 1 bottle of contrast @ 7:00 am (1 hour prior to your exam)  You may take any medications as prescribed with a small amount of water, if necessary. If you take any of the following medications: METFORMIN, GLUCOPHAGE, GLUCOVANCE, AVANDAMET, RIOMET, FORTAMET, [place] MET, JANUMET, GLUMETZA or METAGLIP, you MAY be asked to HOLD this medication 48 hours AFTER the exam.  The purpose of you drinking the oral contrast is to aid in the visualization of your intestinal tract. The contrast solution may cause some diarrhea. Depending on your individual set of symptoms, you may also receive an intravenous injection of x-ray contrast/dye. Plan on being at [facility] for 30 minutes or longer, depending on the type of exam you are having performed.  This test typically takes 30-45 minutes to complete.  If you have any questions regarding your exam or if you need to reschedule, you may call the CT department at [phone] between the hours of 8:00 am and 5:00 pm, Monday-Friday.  _____________________________________________________________  Please purchase the following medications over the counter and take as  directed: Citrucel- Take 1 tablespoon working her way up to 2 tablespoons daily  _____________________________________________________________  [name] have been scheduled for an endoscopy. Please follow written instructions given to you at your visit today. If you use inhalers (even only as needed), please bring them with you on the day of your procedure. Your physician has requested that you go to www.startemmi.com and enter the access code given to you at your visit today. This web site gives a general overview about your procedure. However, you should still follow specific instructions given to you by our office regarding your preparation for the procedure.

## 2018-07-14 ENCOUNTER — Ambulatory Visit (HOSPITAL_COMMUNITY): Payer: Medicare Other

## 2018-07-20 ENCOUNTER — Ambulatory Visit: Payer: Medicare Other | Admitting: Family Medicine

## 2018-07-26 DIAGNOSIS — H26493 Other secondary cataract, bilateral: Secondary | ICD-10-CM | POA: Diagnosis not present

## 2018-07-26 DIAGNOSIS — Z961 Presence of intraocular lens: Secondary | ICD-10-CM | POA: Diagnosis not present

## 2018-07-26 DIAGNOSIS — H35372 Puckering of macula, left eye: Secondary | ICD-10-CM | POA: Diagnosis not present

## 2018-07-26 DIAGNOSIS — H5711 Ocular pain, right eye: Secondary | ICD-10-CM | POA: Diagnosis not present

## 2018-07-28 ENCOUNTER — Other Ambulatory Visit: Payer: Self-pay

## 2018-07-31 ENCOUNTER — Ambulatory Visit (INDEPENDENT_AMBULATORY_CARE_PROVIDER_SITE_OTHER): Payer: Medicare Other | Admitting: Family Medicine

## 2018-07-31 ENCOUNTER — Other Ambulatory Visit: Payer: Self-pay

## 2018-07-31 ENCOUNTER — Encounter: Payer: Self-pay | Admitting: Family Medicine

## 2018-07-31 ENCOUNTER — Ambulatory Visit (HOSPITAL_COMMUNITY)
Admission: RE | Admit: 2018-07-31 | Discharge: 2018-07-31 | Disposition: A | Discharge status: Home / Self Care | Payer: Medicare Other | Source: Ambulatory Visit | Attending: Internal Medicine | Admitting: Internal Medicine

## 2018-07-31 VITALS — BP 116/65 | HR 56 | Temp 93.9°F | Ht 69.0 in | Wt 158.4 lb

## 2018-07-31 DIAGNOSIS — R197 Diarrhea, unspecified: Secondary | ICD-10-CM | POA: Diagnosis not present

## 2018-07-31 DIAGNOSIS — R634 Abnormal weight loss: Secondary | ICD-10-CM | POA: Insufficient documentation

## 2018-07-31 DIAGNOSIS — R1030 Lower abdominal pain, unspecified: Secondary | ICD-10-CM | POA: Insufficient documentation

## 2018-07-31 DIAGNOSIS — K8020 Calculus of gallbladder without cholecystitis without obstruction: Secondary | ICD-10-CM

## 2018-07-31 DIAGNOSIS — J439 Emphysema, unspecified: Secondary | ICD-10-CM

## 2018-07-31 DIAGNOSIS — E7800 Pure hypercholesterolemia, unspecified: Secondary | ICD-10-CM

## 2018-07-31 DIAGNOSIS — E890 Postprocedural hypothyroidism: Secondary | ICD-10-CM

## 2018-07-31 DIAGNOSIS — I10 Essential (primary) hypertension: Secondary | ICD-10-CM | POA: Diagnosis not present

## 2018-07-31 DIAGNOSIS — K219 Gastro-esophageal reflux disease without esophagitis: Secondary | ICD-10-CM | POA: Diagnosis not present

## 2018-07-31 LAB — POCT I-STAT CREATININE: Creatinine, Ser: 0.6 mg/dL (ref 0.44–1.00)

## 2018-07-31 MED ORDER — EZETIMIBE 10 MG PO TABS: ORAL_TABLET | ORAL | 1 refills | Status: DC

## 2018-07-31 MED ORDER — IOHEXOL 300 MG/ML  SOLN
100.0000 mL | Freq: Once | INTRAMUSCULAR | Status: AC | PRN
  Administered 2018-07-31: 09:00:00 100 mL via INTRAVENOUS

## 2018-07-31 NOTE — Progress Notes (Signed)
BP 116/65   Pulse (!) 56   Temp (!) 93.9 F (34.4 C) (Temporal)   Ht 5\' 9"  (1.753 m)   Wt 158 lb 6.4 oz (71.8 kg)   BMI 23.39 kg/m    Subjective:   Patient ID: [name], female    DOB: [birth year], 78 y.o.   MRN: [id number]  HPI: [name] is a 78 y.o. female presenting on 07/31/2018 for Establish Care and Medical Management of Chronic Issues (6 month follow up)   HPI Hypothyroidism recheck Patient is coming in for thyroid recheck today as well. They deny any issues with hair changes or heat or cold problems or diarrhea or constipation. They deny any chest pain or palpitations. They are currently on levothyroxine 154micrograms   Hyperlipidemia Patient is coming in for recheck of his hyperlipidemia. The patient is currently taking fish oil and Zetia. They deny any issues with myalgias or history of liver damage from it. They deny any focal numbness or weakness or chest pain.   Hypertension Patient is currently on lisinopril and hydrochlorothiazide, and their blood pressure today is 116/65. Patient denies any lightheadedness or dizziness. Patient denies headaches, blurred vision, chest pains, shortness of breath, or weakness. Denies any side effects from medication and is content with current medication.   GERD Patient is currently on pantoprazole.  She denies any major symptoms or abdominal pain or belching or burping. She denies any blood in her stool or lightheadedness or dizziness.   COPD Patient is coming in for COPD recheck today.  He is currently on no medication but has been diagnosed with a COPD.  He has a mild chronic cough but denies any major coughing spells or wheezing spells.  He has 0nighttime symptoms per week and 0daytime symptoms per week currently.   Patient has RUQ abd pain and ct abdomen of that showed gallstones. She does not think that it is bad enough to see a surgeon   Relevant past medical, surgical, family and social history reviewed and updated as  indicated. Interim medical history since our last visit reviewed. Allergies and medications reviewed and updated.  Review of Systems  Constitutional: Negative for chills and fever.  Eyes: Negative for redness and visual disturbance.  Respiratory: Negative for chest tightness and shortness of breath.   Cardiovascular: Negative for chest pain and leg swelling.  Gastrointestinal: Positive for abdominal distention and abdominal pain. Negative for anal bleeding, constipation, diarrhea, nausea and vomiting.  Genitourinary: Negative for difficulty urinating and dysuria.  Musculoskeletal: Negative for back pain and gait problem.  Skin: Negative for rash.  Neurological: Negative for dizziness, light-headedness and headaches.  Psychiatric/Behavioral: Negative for agitation and behavioral problems.  All other systems reviewed and are negative.   Per HPI unless specifically indicated above   Allergies as of 07/31/2018      Reactions   Atorvastatin Other (See Comments)   Muscle cramps   Meperidine Hcl Nausea And Vomiting   Makes me 'deathly' sick   Statins Other (See Comments)   REACTION: Patient cannot tolerate due cramps   Norco [hydrocodone-acetaminophen] Nausea And Vomiting   Sulfonamide Derivatives Other (See Comments)   Unknown reaction      Medication List       Accurate as of July 31, 2018  4:32 PM. If you have any questions, ask your nurse or doctor.        STOP taking these medications   valACYclovir 1000 MG tablet Commonly known as: VALTREX Stopped by: Worthy Rancher, MD  TAKE these medications   aspirin 81 MG tablet Take 81 mg by mouth 3 (three) times a week.   busPIRone 30 MG tablet Commonly known as: BUSPAR TAKE (1/2) TO (1) TABLET BY MOUTH ONCE DAILY AS NEEDED   Calcium 500/D 500-125 MG-UNIT Tabs Generic drug: Calcium Carbonate-Vitamin D Take 1 capsule by mouth daily.   donepezil 10 MG tablet Commonly known as: ARICEPT Take 1 tablet (10 mg total) by  mouth at bedtime.   DULoxetine 20 MG capsule Commonly known as: Cymbalta One at night for 1 week, then take one twice a day   ezetimibe 10 MG tablet Commonly known as: ZETIA TAKE 1 TABLET BY MOUTH ONCE DAILY.   Fish Oil 1000 MG Caps Take 1 capsule by mouth daily.   gabapentin 300 MG capsule Commonly known as: NEURONTIN One capsule in the morning and 2 at night   ibuprofen 200 MG tablet Commonly known as: ADVIL Take 800 mg by mouth every 6 (six) hours as needed (Pain).   levothyroxine 112 MCG tablet Commonly known as: SYNTHROID Take 1 tablet (112 mcg total) by mouth daily before breakfast.   lisinopril-hydrochlorothiazide 10-12.5 MG tablet Commonly known as: ZESTORETIC Take 1 tablet by mouth daily.   multivitamin capsule Take 1 capsule by mouth daily.   Senokot S 8.6-50 MG tablet Generic drug: senna-docusate Take 1 tablet by mouth daily as needed for constipation.   VITAMIN B 12 PO Take 1 tablet by mouth daily.        Objective:   BP 116/65   Pulse (!) 56   Temp (!) 93.9 F (34.4 C) (Temporal)   Ht 5\' 9"  (1.753 m)   Wt 158 lb 6.4 oz (71.8 kg)   BMI 23.39 kg/m   Wt Readings from Last 3 Encounters:  07/31/18 158 lb 6.4 oz (71.8 kg)  07/12/18 158 lb 2 oz (71.7 kg)  06/29/18 158 lb (71.7 kg)    Physical Exam Vitals signs and nursing note reviewed.  Constitutional:      General: She is not in acute distress.    Appearance: She is well-developed. She is not diaphoretic.  Eyes:     Conjunctiva/sclera: Conjunctivae normal.  Cardiovascular:     Rate and Rhythm: Normal rate and regular rhythm.     Heart sounds: Normal heart sounds. No murmur.  Pulmonary:     Effort: Pulmonary effort is normal. No respiratory distress.     Breath sounds: Normal breath sounds. No wheezing.  Musculoskeletal: Normal range of motion.        General: No tenderness.  Skin:    General: Skin is warm and dry.     Findings: No rash.  Neurological:     Mental Status: She is alert  and oriented to person, place, and time.     Coordination: Coordination normal.  Psychiatric:        Behavior: Behavior normal.       Assessment & Plan:   Problem List Items Addressed This Visit      Cardiovascular and Mediastinum   HTN (hypertension)   Relevant Medications   ezetimibe (ZETIA) 10 MG tablet     Respiratory   COPD (chronic obstructive pulmonary disease) (HCC)     Digestive   GERD (gastroesophageal reflux disease)     Endocrine   Hypothyroidism, postsurgical - Primary     Other   Hyperlipidemia   Relevant Medications   ezetimibe (ZETIA) 10 MG tablet    Other Visit Diagnoses    Calculus of  gallbladder without cholecystitis without obstruction          Continue patient's current medications including buspirone and Aricept and Cymbalta and Zetia and gabapentin and levothyroxine and lisinopril hydrochlorothiazide and fish oils and pantoprazole Seems like she is doing well and no changes currently. Follow up plan: Return in about 6 months (around 01/31/2019), or if symptoms worsen or fail to improve, for htn and hld.  Counseling provided for all of the vaccine components No orders of the defined types were placed in this encounter.   Caryl Pina, MD [facility] 07/31/2018, 4:32 PM

## 2018-08-01 ENCOUNTER — Telehealth: Payer: Self-pay | Admitting: Internal Medicine

## 2018-08-01 NOTE — Telephone Encounter (Signed)
See result note.  

## 2018-08-01 NOTE — Telephone Encounter (Signed)
Patient is returning your call regarding her CT results.

## 2018-08-02 ENCOUNTER — Telehealth: Payer: Self-pay | Admitting: Internal Medicine

## 2018-08-02 NOTE — Telephone Encounter (Signed)

## 2018-08-03 ENCOUNTER — Encounter: Payer: Self-pay | Admitting: Internal Medicine

## 2018-08-03 ENCOUNTER — Other Ambulatory Visit: Payer: Self-pay

## 2018-08-03 ENCOUNTER — Ambulatory Visit (AMBULATORY_SURGERY_CENTER): Payer: Medicare Other | Admitting: Internal Medicine

## 2018-08-03 VITALS — BP 117/57 | HR 51 | Temp 97.2°F | Resp 12 | Ht 69.0 in | Wt 158.0 lb

## 2018-08-03 DIAGNOSIS — K317 Polyp of stomach and duodenum: Secondary | ICD-10-CM | POA: Diagnosis not present

## 2018-08-03 DIAGNOSIS — D131 Benign neoplasm of stomach: Secondary | ICD-10-CM | POA: Diagnosis not present

## 2018-08-03 DIAGNOSIS — K219 Gastro-esophageal reflux disease without esophagitis: Secondary | ICD-10-CM

## 2018-08-03 DIAGNOSIS — R1310 Dysphagia, unspecified: Secondary | ICD-10-CM

## 2018-08-03 HISTORY — PX: COLONOSCOPY: SHX174

## 2018-08-03 MED ORDER — SODIUM CHLORIDE 0.9 % IV SOLN: 500.0000 mL | Freq: Once | INTRAVENOUS | Status: DC

## 2018-08-03 MED ORDER — PANTOPRAZOLE SODIUM 40 MG PO TBEC: 40.0000 mg | DELAYED_RELEASE_TABLET | Freq: Every day | ORAL | 0 refills | Status: DC

## 2018-08-03 NOTE — Op Note (Signed)
[facility] Patient Name: [name] Procedure Date: 08/03/2018 8:41 AM MRN: [id number] Endoscopist: Jerene Bears , MD Age: 78 Referring MD:  Date of Birth: [birth year] Gender: Female Account #: [id number] Procedure:                Upper GI endoscopy Indications:              Dysphagia, history of GERD, previous favorable                            response to esophageal dilation Medicines:                Monitored Anesthesia Care Procedure:                Pre-Anesthesia Assessment:                           - Prior to the procedure, a History and Physical                            was performed, and patient medications and                            allergies were reviewed. The patient's tolerance of                            previous anesthesia was also reviewed. The risks                            and benefits of the procedure and the sedation                            options and risks were discussed with the patient.                            All questions were answered, and informed consent                            was obtained. Prior Anticoagulants: The patient has                            taken no previous anticoagulant or antiplatelet                            agents. ASA Grade Assessment: II - A patient with                            mild systemic disease. After reviewing the risks                            and benefits, the patient was deemed in                            satisfactory condition to undergo the procedure.  After obtaining informed consent, the endoscope was                            passed under direct vision. Throughout the                            procedure, the patient's blood pressure, pulse, and                            oxygen saturations were monitored continuously. The                            Endoscope was introduced through the mouth, and                            advanced to the second part of  duodenum. The upper                            GI endoscopy was accomplished without difficulty.                            The patient tolerated the procedure well. Scope In: Scope Out: 8:59:45 AM Findings:                 Normal mucosa was found in the entire esophagus.                           No endoscopic abnormality was evident in the                            esophagus to explain the patient's complaint of                            dysphagia. It was decided, however, to proceed with                            dilation of the entire esophagus. The scope was                            withdrawn. Dilation was performed with a Maloney                            dilator with no resistance at 52 Fr.                           A single 7 mm sessile polyp with no bleeding was                            found in the gastric antrum. The polyp was removed                            with a hot snare. Resection and retrieval were  complete.                           The exam of the stomach was otherwise normal.                           The examined duodenum was normal. Complications:            No immediate complications. Estimated Blood Loss:     Estimated blood loss: none. Impression:               - Normal mucosa was found in the entire esophagus.                           - No endoscopic esophageal abnormality to explain                            patient's dysphagia. Esophagus dilated. Dilated.                           - A single gastric polyp. Resected and retrieved.                           - Normal examined duodenum. Recommendation:           - Patient has a contact number available for                            emergencies. The signs and symptoms of potential                            delayed complications were discussed with the                            patient. Return to normal activities tomorrow.                            Written discharge  instructions were provided to the                            patient.                           - Resume previous diet.                           - Continue present medications.                           - No NSAIDs for 2 weeks after polypectomy.                           - Pantoprazole 40 mg daily x 1 month after hot                            snare polypectomy.                           -  Await pathology results. Jerene Bears, MD 08/03/2018 9:11:28 AM This report has been signed electronically.

## 2018-08-03 NOTE — Progress Notes (Signed)
Pt's states no medical or surgical changes since previsit or office visit.  JM temps and CW vitals. Sm

## 2018-08-03 NOTE — Progress Notes (Signed)
Called to room to assist during endoscopic procedure.  Patient ID and intended procedure confirmed with present staff. Received instructions for my participation in the procedure from the performing physician.  

## 2018-08-03 NOTE — Addendum Note (Signed)
Addended by: Ernestine Conrad D on: 08/03/2018 02:48 PM   Modules accepted: Orders

## 2018-08-03 NOTE — Patient Instructions (Addendum)
No NSAIDS: aspirin, aleve and ibuprofen for 2 weeks to prevent bleeding. Read all handouts given to you by your recovery room nurse. Please take your new medication pantoprozole daily before breakfast.  YOU HAD AN ENDOSCOPIC PROCEDURE TODAY AT [place]:   Refer to the procedure report that was given to you for any specific questions about what was found during the examination.  If the procedure report does not answer your questions, please call your gastroenterologist to clarify.  If you requested that your care partner not be given the details of your procedure findings, then the procedure report has been included in a sealed envelope for you to review at your convenience later.  YOU SHOULD EXPECT: Some feelings of bloating in the abdomen. Passage of more gas than usual.  Walking can help get rid of the air that was put into your GI tract during the procedure and reduce the bloating.   Please Note:  You might notice some irritation and congestion in your nose or some drainage.  This is from the oxygen used during your procedure.  There is no need for concern and it should clear up in a day or so.  SYMPTOMS TO REPORT IMMEDIATELY:    Following upper endoscopy (EGD)  Vomiting of blood or coffee ground material  New chest pain or pain under the shoulder blades  Painful or persistently difficult swallowing  New shortness of breath  Fever of 100F or higher  Black, tarry-looking stools  For urgent or emergent issues, a gastroenterologist can be reached at any hour by calling [phone].   DIET:  We do recommend nothing by mouth until 10 am. Then clear liquids until 1100. Then, a soft diet for the rest of today.  Regular diet tomorrow.  .  Drink plenty of fluids but you should avoid alcoholic beverages for 24 hours.  ACTIVITY:  You should plan to take it easy for the rest of today and you should NOT DRIVE or use heavy machinery until tomorrow (because of the sedation  medicines used during the test).    FOLLOW UP: Our staff will call the number listed on your records 48-72 hours following your procedure to check on you and address any questions or concerns that you may have regarding the information given to you following your procedure. If we do not reach you, we will leave a message.  We will attempt to reach you two times.  During this call, we will ask if you have developed any symptoms of COVID 19. If you develop any symptoms (ie: fever, flu-like symptoms, shortness of breath, cough etc.) before then, please call [phone].  If you test positive for Covid 19 in the 2 weeks post procedure, please call and report this information to [place].    If any biopsies were taken you will be contacted by phone or by letter within the next 1-3 weeks.  Please call us at [phone] if you have not heard about the biopsies in 3 weeks.    SIGNATURES/CONFIDENTIALITY: You and/or your care partner have signed paperwork which will be entered into your electronic medical record.  These signatures attest to the fact that that the information above on your After Visit Summary has been reviewed and is understood.  Full responsibility of the confidentiality of this discharge information lies with you and/or your care-partner.

## 2018-08-07 ENCOUNTER — Telehealth: Payer: Self-pay

## 2018-08-07 NOTE — Telephone Encounter (Signed)
  Follow up Call-  Call back number 08/03/2018  Post procedure Call Back phone  # [phone]  Permission to leave phone message Yes  Some recent data might be hidden     Patient questions:  Do you have a fever, pain , or abdominal swelling? No. Pain Score  0 *  Have you tolerated food without any problems? Yes.    Have you been able to return to your normal activities? Yes.    Do you have any questions about your discharge instructions: Diet   No. Medications  No. Follow up visit  No.  Do you have questions or concerns about your Care? No.  Actions: * If pain score is 4 or above: No action needed, pain <4. 1. Have you developed a fever since your procedure? no  2.   Have you had an respiratory symptoms (SOB or cough) since your procedure? no  3.   Have you tested positive for COVID 19 since your procedure no  4.   Have you had any family members/close contacts diagnosed with the COVID 19 since your procedure?  no   If yes to any of these questions please route to Joylene John, RN and Alphonsa Gin, Therapist, sports.

## 2018-08-10 ENCOUNTER — Encounter: Payer: Self-pay | Admitting: Internal Medicine

## 2018-08-15 ENCOUNTER — Ambulatory Visit: Payer: Medicare Other | Admitting: Family Medicine

## 2018-08-21 ENCOUNTER — Other Ambulatory Visit: Payer: Self-pay | Admitting: Family Medicine

## 2018-08-22 MED ORDER — LISINOPRIL-HYDROCHLOROTHIAZIDE 10-12.5 MG PO TABS: 1.0000 | ORAL_TABLET | Freq: Every day | ORAL | 1 refills | Status: DC

## 2018-08-22 NOTE — Addendum Note (Signed)
Addended by: Antonietta Barcelona D on: 08/22/2018 09:11 AM   Modules accepted: Orders

## 2018-09-13 DIAGNOSIS — C50912 Malignant neoplasm of unspecified site of left female breast: Secondary | ICD-10-CM | POA: Diagnosis not present

## 2018-10-30 ENCOUNTER — Other Ambulatory Visit: Payer: Self-pay

## 2018-10-31 ENCOUNTER — Ambulatory Visit (INDEPENDENT_AMBULATORY_CARE_PROVIDER_SITE_OTHER): Payer: Medicare Other

## 2018-10-31 ENCOUNTER — Telehealth: Payer: Self-pay

## 2018-10-31 DIAGNOSIS — Z23 Encounter for immunization: Secondary | ICD-10-CM | POA: Diagnosis not present

## 2018-10-31 NOTE — Telephone Encounter (Signed)
Patient would like rx for new cpap machine. Please advise

## 2018-11-01 NOTE — Telephone Encounter (Signed)
Unfortunately at the last visit we did not extensively discuss sleep apnea because we were discussing the other things and so there is not sufficient documentation to do the order at this point, we will likely have to do it at the next visit, if she wants it sooner than we can schedule a visit for her to come in and do it sooner.  Can be virtual visit as well because following would need to do is discuss the sleep apnea machine and use with her so that we can do the order.

## 2018-11-01 NOTE — Telephone Encounter (Signed)
lmtcb

## 2018-11-13 NOTE — Telephone Encounter (Signed)
Patient never returned call, encounter closed 

## 2018-11-20 ENCOUNTER — Other Ambulatory Visit: Payer: Self-pay | Admitting: Neurology

## 2019-01-09 NOTE — Progress Notes (Signed)
PATIENT: [name] DOB: 10/21/1940  REASON FOR VISIT: follow up HISTORY FROM: patient  HISTORY OF PRESENT ILLNESS: Today 01/10/19  [name] is a 79 year old female with history of mild cognitive impairment, trigeminal neuralgia, and peripheral neuropathy.  She also complains of right frontal and retro-orbital headaches.  She remains on gabapentin, at last visit she was started on Cymbalta.  She reports that her memory has stayed the same.  She remains on Aricept.  She reports that her headaches are under good control with gabapentin.  She continues to complain of burning and stinging in her legs, up to her knees, but she thinks the Cymbalta has been beneficial.  She does continue to have occasional nocturnal leg cramps, but she has found that eating mustard has been helpful.  [place], but has family nearby.  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  She has not had any falls.  She reports her daughter is planning to take over management of her medications, because there are times she forgets if she has had taken something.  She continues to drive a car without difficulty.  She presents today for evaluation unaccompanied.  HISTORY 06/29/2018 Dr. Jannifer Franklin: [name] is a 79 year old right-handed white female with a history of mild cognitive impairment.  The patient has been on Aricept, she tolerates the medication fairly well, she has not noted a lot of change in her memory issues over the last 6 months.  She continues to have episodes of right frontal and retro-orbital headaches that sometimes are sharp and jabbing in nature.  The patient has been on gabapentin taking 300 mg in the morning and 600 mg in the evening.  She was placed on Effexor on her last visit but she stopped the medication, the reasons are not completely clear.  The patient seems to have more headaches when the weather is hot.  She may have 2 or 3 such events  a week.  The patient has also had a lot of nocturnal leg cramps, again 2 or 3 times a week.  She just started taking magnesium supplementation for this.  She returns this office for further evaluation.   REVIEW OF SYSTEMS: Out of a complete 14 system review of symptoms, the patient complains only of the following symptoms, and all other reviewed systems are negative.  Memory loss, neuropathy  ALLERGIES: Allergies  Allergen Reactions  . Atorvastatin Other (See Comments)    Muscle cramps  . Meperidine Hcl Nausea And Vomiting    Makes me 'deathly' sick  . Statins Other (See Comments)    REACTION: Patient cannot tolerate due cramps  . Norco [Hydrocodone-Acetaminophen] Nausea And Vomiting  . Sulfonamide Derivatives Other (See Comments)    Unknown reaction    HOME MEDICATIONS: Outpatient Medications Prior to Visit  Medication Sig Dispense Refill  . aspirin 81 MG tablet Take 81 mg by mouth 3 (three) times a week.     . busPIRone (BUSPAR) 30 MG tablet TAKE (1/2) TO (1) TABLET BY MOUTH ONCE DAILY AS NEEDED 30 tablet 0  . Calcium Carbonate-Vitamin D (CALCIUM 500/D) 500-125 MG-UNIT TABS Take 1 capsule by mouth daily.      . Cyanocobalamin (VITAMIN B 12 PO) Take 1 tablet by mouth daily.     Marland Kitchen donepezil (ARICEPT) 10 MG tablet TAKE 1 TABLET BY MOUTH AT BEDTIME. 90 tablet 0  . DULoxetine (CYMBALTA) 20 MG capsule  One at night for 1 week, then take one twice a day 60 capsule 3  . ezetimibe (ZETIA) 10 MG tablet TAKE 1 TABLET BY MOUTH ONCE DAILY. 90 tablet 1  . gabapentin (NEURONTIN) 300 MG capsule TAKE 1 CAPSULE EACH MORNING AND 2 AT BEDTIME. 270 capsule 0  . ibuprofen (ADVIL,MOTRIN) 200 MG tablet Take 800 mg by mouth every 6 (six) hours as needed (Pain).     Marland Kitchen levothyroxine (SYNTHROID, LEVOTHROID) 112 MCG tablet Take 1 tablet (112 mcg total) by mouth daily before breakfast. 90 tablet 3  . lisinopril-hydrochlorothiazide (ZESTORETIC) 10-12.5 MG tablet Take 1 tablet by mouth daily. 90 tablet 1  .  Multiple Vitamin (MULTIVITAMIN) capsule Take 1 capsule by mouth daily.      . Omega-3 Fatty Acids (FISH OIL) 1000 MG CAPS Take 1 capsule by mouth daily.      Marland Kitchen senna-docusate (SENOKOT S) 8.6-50 MG per tablet Take 1 tablet by mouth daily as needed for constipation.    . pantoprazole (PROTONIX) 40 MG tablet Take 1 tablet (40 mg total) by mouth daily. (Patient not taking: Reported on 01/10/2019) 30 tablet 0   No facility-administered medications prior to visit.    PAST MEDICAL HISTORY: Past Medical History:  Diagnosis Date  . Anal or rectal pain   . Anxiety state, unspecified   . Arthritis   . Arthropathy, unspecified, site unspecified   . Carpal tunnel syndrome    Bilateral  . Cataract   . Chronic airway obstruction, not elsewhere classified    patient denies on 02/27/14  . Esophageal reflux   . Esophageal stricture   . Hypertension   . Internal hemorrhoids without mention of complication   . Malignant neoplasm of breast (female), unspecified site    left breast   . Memory disorder 09/24/2013  . Nocturnal leg cramps 06/29/2018  . Osteopenia   . Other and unspecified hyperlipidemia   . Pain in joint, multiple sites   . Personal history of colonic polyps    adenomatous  . Polyneuropathy in other diseases classified elsewhere ([place]) 09/24/2013  . Shingles   . Sleep apnea    cpap   . Tubular adenoma of colon   . Unspecified hypothyroidism     PAST SURGICAL HISTORY: Past Surgical History:  Procedure Laterality Date  . APPENDECTOMY  03/2014  . BACK SURGERY     X2  . BREAST LUMPECTOMY    . EYE SURGERY    . LAPAROSCOPIC APPENDECTOMY N/A 02/28/2014   Procedure: APPENDECTOMY LAPAROSCOPIC PARTIAL CECECTOMY FOR MUCOCELE OF APPENDIX ;  Surgeon: Michael Boston, MD;  Location: [facility];  Service: General;  Laterality: N/A;  . MASTECTOMY     left  . THYROIDECTOMY    . TUBAL LIGATION      FAMILY HISTORY: Family History  Problem Relation Age of Onset  . Bipolar disorder Sister   . Early  death Mother   . Early death Sister   . Colon cancer Neg Hx   . Esophageal cancer Neg Hx   . Rectal cancer Neg Hx   . Stomach cancer Neg Hx     SOCIAL HISTORY: Social History   Socioeconomic History  . Marital status: Divorced    Spouse name: Not on file  . Number of children: 2  . Years of education: Not on file  . Highest education level: Not on file  Occupational History  . Occupation: Retired    Fish farm manager: RETIRED  Tobacco Use  . Smoking status: Never Smoker  .  Smokeless tobacco: Never Used  Substance and Sexual Activity  . Alcohol use: No    Alcohol/week: 0.0 standard drinks  . Drug use: No  . Sexual activity: Never  Other Topics Concern  . Not on file  Social History Narrative   Patient lives at home alone and she is divorced.   Retired.   Education business course.   Right handed.   Caffeine sometimes tea.    Social Determinants of Health   Financial Resource Strain:   . Difficulty of Paying Living Expenses: Not on file  Food Insecurity:   . Worried About Charity fundraiser in the Last Year: Not on file  . Ran Out of Food in the Last Year: Not on file  Transportation Needs:   . Lack of Transportation (Medical): Not on file  . Lack of Transportation (Non-Medical): Not on file  Physical Activity:   . Days of Exercise per Week: Not on file  . Minutes of Exercise per Session: Not on file  Stress:   . Feeling of Stress : Not on file  Social Connections:   . Frequency of Communication with Friends and Family: Not on file  . Frequency of Social Gatherings with Friends and Family: Not on file  . Attends Religious Services: Not on file  . Active Member of Clubs or Organizations: Not on file  . Attends Archivist Meetings: Not on file  . Marital Status: Not on file  Intimate Partner Violence:   . Fear of Current or Ex-Partner: Not on file  . Emotionally Abused: Not on file  . Physically Abused: Not on file  . Sexually Abused: Not on file     PHYSICAL EXAM  Vitals:   01/10/19 1046  BP: 121/68  Pulse: 67  Temp: (!) 96.8 F (36 C)  Weight: 157 lb 12.8 oz (71.6 kg)  Height: 5\' 9"  (1.753 m)   Body mass index is 23.3 kg/m.  Generalized: Well developed, in no acute distress   Neurological examination  Mentation: Alert oriented to time, place, history taking. Follows all commands speech and language fluent Cranial nerve II-XII: Pupils were equal round reactive to light. Extraocular movements were full, visual field were full on confrontational test. Facial sensation and strength were normal. Head turning and shoulder shrug  were normal and symmetric. Motor: The motor testing reveals 5 over 5 strength of all 4 extremities. Good symmetric motor tone is noted throughout.  Sensory: Sensory testing is intact to soft touch on all 4 extremities. No evidence of extinction is noted.  Coordination: Cerebellar testing reveals good finger-nose-finger and heel-to-shin bilaterally.  Gait and station: Gait is normal. Tandem gait is mildly unsteady.  Romberg is negative. No drift is seen.  Reflexes: Deep tendon reflexes are symmetric    DIAGNOSTIC DATA (LABS, IMAGING, TESTING) - I reviewed patient records, labs, notes, testing and imaging myself where available.  Lab Results  Component Value Date   WBC 5.2 06/09/2018   HGB 12.9 06/09/2018   HCT 40.4 06/09/2018   MCV 94 06/09/2018   PLT 225 06/09/2018      Component Value Date/Time   NA 142 06/09/2018 0947   NA 144 04/23/2013 1549   K 4.1 06/09/2018 0947   K 3.5 04/23/2013 1549   CL 103 06/09/2018 0947   CO2 27 06/09/2018 0947   CO2 25 04/23/2013 1549   GLUCOSE 93 06/09/2018 0947   GLUCOSE 94 12/01/2015 1626   GLUCOSE 97 04/23/2013 1549   BUN 15 06/09/2018  0947   BUN 20.1 04/23/2013 1549   CREATININE 0.60 07/31/2018 0832   CREATININE 0.7 04/23/2013 1549   CALCIUM 9.3 06/09/2018 0947   CALCIUM 10.3 04/23/2013 1549   PROT 6.5 06/09/2018 0947   PROT 6.8 04/23/2013 1549    ALBUMIN 4.5 06/09/2018 0947   ALBUMIN 4.1 04/23/2013 1549   AST 22 06/09/2018 0947   AST 31 04/23/2013 1549   ALT 18 06/09/2018 0947   ALT 32 04/23/2013 1549   ALKPHOS 46 06/09/2018 0947   ALKPHOS 74 04/23/2013 1549   BILITOT 0.8 06/09/2018 0947   BILITOT 0.53 04/23/2013 1549   GFRNONAA 86 06/09/2018 0947   GFRNONAA >89 07/10/2012 1056   GFRAA 100 06/09/2018 0947   GFRAA >89 07/10/2012 1056   Lab Results  Component Value Date   CHOL 209 (H) 02/24/2018   HDL 46 02/24/2018   LDLCALC 129 (H) 02/24/2018   LDLDIRECT 164 (H) 06/07/2014   TRIG 171 (H) 02/24/2018   CHOLHDL 4.5 (H) 02/24/2018   Lab Results  Component Value Date   HGBA1C 5.2 09/28/2013   Lab Results  Component Value Date   VITAMINB12 1,831 (H) 06/09/2018   Lab Results  Component Value Date   TSH 0.973 06/09/2018      ASSESSMENT AND PLAN 79 y.o. year old female  has a past medical history of Anal or rectal pain, Anxiety state, unspecified, Arthritis, Arthropathy, unspecified, site unspecified, Carpal tunnel syndrome, Cataract, Chronic airway obstruction, not elsewhere classified, Esophageal reflux, Esophageal stricture, Hypertension, Internal hemorrhoids without mention of complication, Malignant neoplasm of breast (female), unspecified site, Memory disorder (09/24/2013), Nocturnal leg cramps (06/29/2018), Osteopenia, Other and unspecified hyperlipidemia, Pain in joint, multiple sites, Personal history of colonic polyps, Polyneuropathy in other diseases classified elsewhere ([place]) (09/24/2013), Shingles, Sleep apnea, Tubular adenoma of colon, and Unspecified hypothyroidism. here with:  1.  Mild cognitive impairment 2.  Nocturnal leg cramps 3.  Trigeminal neuralgia 4.  Peripheral neuropathy  Overall, she continues to do well.  Her memory has remained stable, she will remain on Aricept.  Her nocturnal leg cramps are well managed by having a spoonful of mustard.  We may consider the addition of baclofen in the future  if needed, but she prefers less medications.  Her headaches are well controlled with gabapentin.  She will remain on Cymbalta 20 mg twice a day for neuropathy.  I refill her medications.  She will follow-up in 8 months or sooner if needed.  I did advise if her symptoms worsen or she develops any new symptoms she should let us know.   I spent 15 minutes with the patient. 50% of this time was spent discussing her plan of care.   Butler Denmark, AGNP-C, DNP 01/10/2019, 10:56 AM [facility] [address] [place], [place] [address] [phone]

## 2019-01-10 ENCOUNTER — Ambulatory Visit: Payer: Medicare Other | Admitting: Neurology

## 2019-01-10 ENCOUNTER — Other Ambulatory Visit: Payer: Self-pay

## 2019-01-10 ENCOUNTER — Encounter: Payer: Self-pay | Admitting: Neurology

## 2019-01-10 VITALS — BP 121/68 | HR 67 | Temp 96.8°F | Ht 69.0 in | Wt 157.8 lb

## 2019-01-10 DIAGNOSIS — G63 Polyneuropathy in diseases classified elsewhere: Secondary | ICD-10-CM

## 2019-01-10 DIAGNOSIS — G500 Trigeminal neuralgia: Secondary | ICD-10-CM

## 2019-01-10 DIAGNOSIS — G3184 Mild cognitive impairment, so stated: Secondary | ICD-10-CM

## 2019-01-10 MED ORDER — DULOXETINE HCL 20 MG PO CPEP: ORAL_CAPSULE | ORAL | 3 refills | Status: DC

## 2019-01-10 MED ORDER — DONEPEZIL HCL 10 MG PO TABS: 10.0000 mg | ORAL_TABLET | Freq: Every day | ORAL | 3 refills | Status: DC

## 2019-01-10 MED ORDER — GABAPENTIN 300 MG PO CAPS: ORAL_CAPSULE | ORAL | 3 refills | Status: DC

## 2019-01-10 NOTE — Patient Instructions (Signed)
Continue current medications. I think you look well today :) We will see you back in 8 months.

## 2019-01-13 NOTE — Progress Notes (Signed)
I have read the note, and I agree with the clinical assessment and plan.  Charlesia Canaday K Kyri Dai   

## 2019-01-22 DIAGNOSIS — Z03818 Encounter for observation for suspected exposure to other biological agents ruled out: Secondary | ICD-10-CM | POA: Diagnosis not present

## 2019-02-01 ENCOUNTER — Telehealth: Payer: Self-pay | Admitting: *Deleted

## 2019-02-01 ENCOUNTER — Ambulatory Visit: Payer: Medicare Other | Admitting: Family Medicine

## 2019-02-01 NOTE — Telephone Encounter (Signed)
A message was left, re: her follow up visit with Dr.Hochrein. 

## 2019-02-15 ENCOUNTER — Other Ambulatory Visit: Payer: Self-pay | Admitting: Family Medicine

## 2019-02-20 ENCOUNTER — Other Ambulatory Visit (HOSPITAL_COMMUNITY): Payer: Self-pay | Admitting: Family Medicine

## 2019-02-20 DIAGNOSIS — Z1231 Encounter for screening mammogram for malignant neoplasm of breast: Secondary | ICD-10-CM

## 2019-02-28 ENCOUNTER — Ambulatory Visit (INDEPENDENT_AMBULATORY_CARE_PROVIDER_SITE_OTHER): Payer: Medicare Other | Admitting: Cardiology

## 2019-02-28 ENCOUNTER — Encounter: Payer: Self-pay | Admitting: Cardiology

## 2019-02-28 VITALS — BP 120/70 | HR 66 | Ht 69.5 in | Wt 159.0 lb

## 2019-02-28 DIAGNOSIS — R001 Bradycardia, unspecified: Secondary | ICD-10-CM | POA: Diagnosis not present

## 2019-02-28 DIAGNOSIS — Z7189 Other specified counseling: Secondary | ICD-10-CM | POA: Diagnosis not present

## 2019-02-28 DIAGNOSIS — I10 Essential (primary) hypertension: Secondary | ICD-10-CM

## 2019-02-28 DIAGNOSIS — G4730 Sleep apnea, unspecified: Secondary | ICD-10-CM

## 2019-02-28 NOTE — Progress Notes (Signed)
Cardiology Office Note   Date:  02/28/2019   ID:  [name], DOB 10/23/1940, MRN [id number]  PCP:  Dettinger, Fransisca Kaufmann, MD  Cardiologist:   No primary care provider on file.   No chief complaint on file.     History of Present Illness: [name] is a 79 y.o. female who presents for follow up of a presyncopal episode.  I saw her for chest pain in 2017.  She had a negative perfusion study.   In August 2019 she had episode of presyncope.    Since I last saw her she has not had any new presyncope or syncope but she feels tired all the time.  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  She denies any chest discomfort, neck or arm discomfort.  She might get somewhat winded but this is not been new.  She has a lower heart rate in the 60s and 50s sometimes but not any other pauses.  Of note she does have sleep apnea and has not worn a CPAP in years.    Past Medical History:  Diagnosis Date  . Anal or rectal pain   . Anxiety state, unspecified   . Arthritis   . Arthropathy, unspecified, site unspecified   . Carpal tunnel syndrome    Bilateral  . Cataract   . Chronic airway obstruction, not elsewhere classified    patient denies on 02/27/14  . Esophageal reflux   . Esophageal stricture   . Hypertension   . Internal hemorrhoids without mention of complication   . Malignant neoplasm of breast (female), unspecified site    left breast   . Memory disorder 09/24/2013  . Nocturnal leg cramps 06/29/2018  . Osteopenia   . Other and unspecified hyperlipidemia   . Pain in joint, multiple sites   . Personal history of colonic polyps    adenomatous  . Polyneuropathy in other diseases classified elsewhere ([place]) 09/24/2013  . Shingles   . Sleep apnea    cpap   . Tubular adenoma of colon   . Unspecified hypothyroidism     Past Surgical History:  Procedure Laterality Date  . APPENDECTOMY  03/2014  . BACK SURGERY      X2  . BREAST LUMPECTOMY    . EYE SURGERY    . LAPAROSCOPIC APPENDECTOMY N/A 02/28/2014   Procedure: APPENDECTOMY LAPAROSCOPIC PARTIAL CECECTOMY FOR MUCOCELE OF APPENDIX ;  Surgeon: Michael Boston, MD;  Location: [facility];  Service: General;  Laterality: N/A;  . MASTECTOMY     left  . THYROIDECTOMY    . TUBAL LIGATION       Current Outpatient Medications  Medication Sig Dispense Refill  . aspirin 81 MG tablet Take 81 mg by mouth 3 (three) times a week.     . busPIRone (BUSPAR) 30 MG tablet TAKE (1/2) TO (1) TABLET BY MOUTH ONCE DAILY AS NEEDED 30 tablet 0  . Calcium Carbonate-Vitamin D (CALCIUM 500/D) 500-125 MG-UNIT TABS Take 1 capsule by mouth daily.      . Cyanocobalamin (VITAMIN B 12 PO) Take 1 tablet by mouth daily.     Marland Kitchen donepezil (ARICEPT) 10 MG tablet Take 1 tablet (10 mg total) by mouth at bedtime. 90 tablet 3  . DULoxetine (CYMBALTA) 20 MG capsule Take 1 twice a day (Patient taking differently: Take 20 mg by mouth 2 (two) times daily. Take 1 twice a day) 180  capsule 3  . ezetimibe (ZETIA) 10 MG tablet TAKE 1 TABLET BY MOUTH ONCE DAILY. 90 tablet 0  . gabapentin (NEURONTIN) 300 MG capsule TAKE 1 CAPSULE EACH MORNING AND 2 AT BEDTIME. 270 capsule 3  . ibuprofen (ADVIL,MOTRIN) 200 MG tablet Take 800 mg by mouth every 6 (six) hours as needed (Pain).     Marland Kitchen levothyroxine (SYNTHROID, LEVOTHROID) 112 MCG tablet Take 1 tablet (112 mcg total) by mouth daily before breakfast. 90 tablet 3  . lisinopril-hydrochlorothiazide (ZESTORETIC) 10-12.5 MG tablet Take 1 tablet by mouth daily. 90 tablet 1  . Multiple Vitamin (MULTIVITAMIN) capsule Take 1 capsule by mouth daily.      . Omega-3 Fatty Acids (FISH OIL) 1000 MG CAPS Take 1 capsule by mouth daily.      . pantoprazole (PROTONIX) 40 MG tablet Take 1 tablet (40 mg total) by mouth daily. 30 tablet 0  . senna-docusate (SENOKOT S) 8.6-50 MG per tablet Take 1 tablet by mouth daily as needed for constipation.     No current facility-administered  medications for this visit.    Allergies:   Atorvastatin, Meperidine hcl, Statins, Norco [hydrocodone-acetaminophen], and Sulfonamide derivatives   ROS:  Please see the history of present illness.   Otherwise, review of systems are positive for none.   All other systems are reviewed and negative.    PHYSICAL EXAM: VS:  BP 120/70   Pulse 66   Ht 5' 9.5" (1.765 m)   Wt 159 lb (72.1 kg)   BMI 23.14 kg/m  , BMI Body mass index is 23.14 kg/m. GENERAL:  Well appearing NECK:  No jugular venous distention, waveform within normal limits, carotid upstroke brisk and symmetric, no bruits, no thyromegaly LUNGS:  Clear to auscultation bilaterally CHEST:  Left mastectomy HEART:  PMI not displaced or sustained,S1 and S2 within normal limits, no S3, no S4, no clicks, no rubs, no murmurs ABD:  Flat, positive bowel sounds normal in frequency in pitch, no bruits, no rebound, no guarding, no midline pulsatile mass, no hepatomegaly, no splenomegaly EXT:  2 plus pulses throughout, no edema, no cyanosis no clubbing     EKG:  EKG is ordered today. The ekg ordered today demonstrates sinus rhythm, rate 66, axis normal limits intervals within normal limits, no acute ST-T wave changes.   Recent Labs: 06/09/2018: ALT 18; BUN 15; Hemoglobin 12.9; Platelets 225; Potassium 4.1; Sodium 142; TSH 0.973 07/31/2018: Creatinine, Ser 0.60    Lipid Panel    Component Value Date/Time   CHOL 209 (H) 02/24/2018 1610   CHOL 228 (H) 05/17/2012 1134   TRIG 171 (H) 02/24/2018 1610   TRIG 92 10/01/2015 1129   TRIG 88 05/17/2012 1134   HDL 46 02/24/2018 1610   HDL 61 10/01/2015 1129   HDL 55 05/17/2012 1134   CHOLHDL 4.5 (H) 02/24/2018 1610   LDLCALC 129 (H) 02/24/2018 1610   LDLCALC 161 (H) 09/28/2013 0823   LDLCALC 155 (H) 05/17/2012 1134   LDLDIRECT 164 (H) 06/07/2014 0858      Wt Readings from Last 3 Encounters:  02/28/19 159 lb (72.1 kg)  01/10/19 157 lb 12.8 oz (71.6 kg)  08/03/18 158 lb (71.7 kg)       Other studies Reviewed: Additional studies/ records that were reviewed today include:  Labs. Review of the above records demonstrates:  Please see elsewhere in the note.     ASSESSMENT AND PLAN:  BRADYCARDIA:   She wore a monitor for a month previously and there were no arrhythmias.  We talked about this.  I do not think that this is the etiology of her fatigue.  This will be addressed as below.  I do not think further monitoring is indicated unless she has a change in symptoms.  HTN:  The blood pressure is at target.  No change in therapy.   FATIGUE: I think this may well be related to the sleep apnea and will be addressed below.  I do note that her thyroid was normal recently and she is not anemic.  SLEEP APNEA: She has untreated sleep apnea previously diagnosed.  I am going to refer her to Dr. Celedonio Miyamoto.  COVID EDUCATION: She is leery of getting the vaccine.  We discussed this briefly and I endorsed getting the vaccination.  Current medicines are reviewed at length with the patient today.  The patient does not have concerns regarding medicines.  The following changes have been made:  no change  Labs/ tests ordered today include:   Orders Placed This Encounter  Procedures  . Ambulatory referral to Cardiology  . EKG 12-Lead     Disposition:   FU with me in one year.     Signed, Minus Breeding, MD  02/28/2019 4:23 PM    [place]

## 2019-02-28 NOTE — Patient Instructions (Signed)
Medication Instructions:  The current medical regimen is effective;  continue present plan and medications.  *If you need a refill on your cardiac medications before your next appointment, please call your pharmacy*  You have been referred to Dr Shelva Majestic for further evaluation and treatment of sleep apnea.  Follow-Up: At [facility], you and your health needs are our priority.  As part of our continuing mission to provide you with exceptional heart care, we have created designated Provider Care Teams.  These Care Teams include your primary Cardiologist (physician) and Advanced Practice Providers (APPs -  Physician Assistants and Nurse Practitioners) who all work together to provide you with the care you need, when you need it.  Your next appointment:   12 month(s)  The format for your next appointment:   In Person  Provider:   Minus Breeding, MD  Thank you for choosing [facility]!!

## 2019-03-01 ENCOUNTER — Telehealth: Payer: Self-pay | Admitting: Cardiovascular Disease

## 2019-03-01 NOTE — Telephone Encounter (Signed)
03/01/19 LVM to call and schedule appt--- AF °

## 2019-03-12 ENCOUNTER — Encounter: Payer: Self-pay | Admitting: Internal Medicine

## 2019-03-16 ENCOUNTER — Other Ambulatory Visit: Payer: Self-pay

## 2019-03-16 ENCOUNTER — Ambulatory Visit (HOSPITAL_COMMUNITY)
Admission: RE | Admit: 2019-03-16 | Discharge: 2019-03-16 | Disposition: A | Discharge status: Home / Self Care | Payer: Medicare Other | Source: Ambulatory Visit | Attending: Family Medicine | Admitting: Family Medicine

## 2019-03-16 DIAGNOSIS — Z1231 Encounter for screening mammogram for malignant neoplasm of breast: Secondary | ICD-10-CM | POA: Insufficient documentation

## 2019-03-28 ENCOUNTER — Ambulatory Visit (INDEPENDENT_AMBULATORY_CARE_PROVIDER_SITE_OTHER): Payer: Medicare Other | Admitting: Family Medicine

## 2019-03-28 ENCOUNTER — Other Ambulatory Visit: Payer: Self-pay

## 2019-03-28 ENCOUNTER — Encounter: Payer: Self-pay | Admitting: Family Medicine

## 2019-03-28 VITALS — BP 103/58 | HR 75 | Temp 98.9°F | Ht 69.5 in | Wt 160.0 lb

## 2019-03-28 DIAGNOSIS — R5383 Other fatigue: Secondary | ICD-10-CM

## 2019-03-28 DIAGNOSIS — E890 Postprocedural hypothyroidism: Secondary | ICD-10-CM | POA: Diagnosis not present

## 2019-03-28 DIAGNOSIS — I10 Essential (primary) hypertension: Secondary | ICD-10-CM | POA: Diagnosis not present

## 2019-03-28 DIAGNOSIS — E7800 Pure hypercholesterolemia, unspecified: Secondary | ICD-10-CM

## 2019-03-28 NOTE — Progress Notes (Signed)
BP (!) 103/58   Pulse 75   Temp 98.9 F (37.2 C)   Ht 5' 9.5" (1.765 m)   Wt 160 lb (72.6 kg)   SpO2 99%   BMI 23.29 kg/m    Subjective:   Patient ID: [name], female    DOB: 12/21/1940, 79 y.o.   MRN: [id number]  HPI: [name] is a 79 y.o. female presenting on 03/28/2019 for Medical Management of Chronic Issues, Hypothyroidism, and Fatigue   HPI Hypothyroidism recheck Patient is coming in for thyroid recheck today as well. They deny any issues with hair changes or heat or cold problems or diarrhea or constipation. They deny any chest pain or palpitations. They are currently on levothyroxine 145mcrograms   Hyperlipidemia Patient is coming in for recheck of his hyperlipidemia. The patient is currently taking omega-3's. They deny any issues with myalgias or history of liver damage from it. They deny any focal numbness or weakness or chest pain.   Hypertension Patient is currently on lisinopril hydrochlorothiazide, and their blood pressure today is 103/58. Patient denies any lightheadedness or dizziness. Patient denies headaches, blurred vision, chest pains, shortness of breath, or weakness. Denies any side effects from medication and is content with current medication.   Patient has been complaining of a lot of fatigue and tiredness daytime somnolence and not feeling like she slept well.  She does admit that she was given a CPAP before but is not using it in quite some time and she is going for evaluation again for that later this month.  Relevant past medical, surgical, family and social history reviewed and updated as indicated. Interim medical history since our last visit reviewed. Allergies and medications reviewed and updated.  Review of Systems  Constitutional: Positive for fatigue. Negative for chills and fever.  HENT: Negative for congestion, ear discharge and ear pain.   Eyes: Negative for redness and visual disturbance.  Respiratory: Negative for chest  tightness and shortness of breath.   Cardiovascular: Negative for chest pain and leg swelling.  Musculoskeletal: Negative for back pain and gait problem.  Skin: Negative for rash.  Neurological: Negative for dizziness, light-headedness and headaches.  Psychiatric/Behavioral: Negative for agitation and behavioral problems.  All other systems reviewed and are negative.   Per HPI unless specifically indicated above   Allergies as of 03/28/2019      Reactions   Atorvastatin Other (See Comments)   Muscle cramps   Meperidine Hcl Nausea And Vomiting   Makes me 'deathly' sick   Statins Other (See Comments)   REACTION: Patient cannot tolerate due cramps   Norco [hydrocodone-acetaminophen] Nausea And Vomiting   Sulfonamide Derivatives Other (See Comments)   Unknown reaction      Medication List       Accurate as of March 28, 2019  2:38 PM. If you have any questions, ask your nurse or doctor.        aspirin 81 MG tablet Take 81 mg by mouth 3 (three) times a week.   busPIRone 30 MG tablet Commonly known as: BUSPAR TAKE (1/2) TO (1) TABLET BY MOUTH ONCE DAILY AS NEEDED   Calcium 500/D 500-125 MG-UNIT Tabs Generic drug: Calcium Carbonate-Vitamin D Take 1 capsule by mouth daily.   donepezil 10 MG tablet Commonly known as: ARICEPT Take 1 tablet (10 mg total) by mouth at bedtime.   DULoxetine 20 MG capsule Commonly known as: Cymbalta Take 1 twice a day What changed:   how much to take  how to take  this  when to take this   ezetimibe 10 MG tablet Commonly known as: ZETIA TAKE 1 TABLET BY MOUTH ONCE DAILY.   Fish Oil 1000 MG Caps Take 1 capsule by mouth daily.   gabapentin 300 MG capsule Commonly known as: NEURONTIN TAKE 1 CAPSULE EACH MORNING AND 2 AT BEDTIME.   ibuprofen 200 MG tablet Commonly known as: ADVIL Take 800 mg by mouth every 6 (six) hours as needed (Pain).   levothyroxine 112 MCG tablet Commonly known as: SYNTHROID Take 1 tablet (112 mcg total) by  mouth daily before breakfast.   lisinopril-hydrochlorothiazide 10-12.5 MG tablet Commonly known as: ZESTORETIC Take 1 tablet by mouth daily.   multivitamin capsule Take 1 capsule by mouth daily.   pantoprazole 40 MG tablet Commonly known as: PROTONIX Take 1 tablet (40 mg total) by mouth daily.   Senokot S 8.6-50 MG tablet Generic drug: senna-docusate Take 1 tablet by mouth daily as needed for constipation.   VITAMIN B 12 PO Take 1 tablet by mouth daily.        Objective:   BP (!) 103/58   Pulse 75   Temp 98.9 F (37.2 C)   Ht 5' 9.5" (1.765 m)   Wt 160 lb (72.6 kg)   SpO2 99%   BMI 23.29 kg/m   Wt Readings from Last 3 Encounters:  03/28/19 160 lb (72.6 kg)  02/28/19 159 lb (72.1 kg)  01/10/19 157 lb 12.8 oz (71.6 kg)    Physical Exam Vitals and nursing note reviewed.  Constitutional:      General: She is not in acute distress.    Appearance: She is well-developed. She is not diaphoretic.  Eyes:     Conjunctiva/sclera: Conjunctivae normal.  Cardiovascular:     Rate and Rhythm: Normal rate and regular rhythm.     Heart sounds: Normal heart sounds. No murmur.  Pulmonary:     Effort: Pulmonary effort is normal. No respiratory distress.     Breath sounds: Normal breath sounds. No wheezing.  Musculoskeletal:        General: No swelling.  Skin:    General: Skin is warm and dry.     Findings: No rash.  Neurological:     Mental Status: She is alert and oriented to person, place, and time.     Coordination: Coordination normal.  Psychiatric:        Behavior: Behavior normal.       Assessment & Plan:   Problem List Items Addressed This Visit      Cardiovascular and Mediastinum   HTN (hypertension)   Relevant Orders   CMP14+EGFR     Endocrine   Hypothyroidism, postsurgical - Primary   Relevant Orders   TSH     Other   Hyperlipidemia   Relevant Orders   Lipid panel    Other Visit Diagnoses    Other fatigue       Relevant Orders   CBC with  Differential/Platelet      Patient having a lot of fatigue still, she is going for reevaluation for CPAP and hopefully will start using the machine as she has not been using it quite some time.  We will recheck her TSH and thyroid and blood counts and electrolytes and chemistry panel.   Follow up plan: Return in about 6 months (around 09/28/2019), or if symptoms worsen or fail to improve, for Thyroid and cholesterol recheck.  Counseling provided for all of the vaccine components Orders Placed This Encounter  Procedures  .  TSH  . CBC with Differential/Platelet  . CMP14+EGFR  . Lipid panel    Caryl Pina, MD [facility] 03/28/2019, 2:38 PM

## 2019-03-29 LAB — CBC WITH DIFFERENTIAL/PLATELET
Basophils Absolute: 0.1 10*3/uL (ref 0.0–0.2)
Basos: 1 %
EOS (ABSOLUTE): 0.5 10*3/uL — ABNORMAL HIGH (ref 0.0–0.4)
Eos: 9 %
Hematocrit: 38.1 % (ref 34.0–46.6)
Hemoglobin: 12.6 g/dL (ref 11.1–15.9)
Immature Grans (Abs): 0 10*3/uL (ref 0.0–0.1)
Immature Granulocytes: 0 %
Lymphocytes Absolute: 1.7 10*3/uL (ref 0.7–3.1)
Lymphs: 28 %
MCH: 31.1 pg (ref 26.6–33.0)
MCHC: 33.1 g/dL (ref 31.5–35.7)
MCV: 94 fL (ref 79–97)
Monocytes Absolute: 0.4 10*3/uL (ref 0.1–0.9)
Monocytes: 7 %
Neutrophils Absolute: 3.4 10*3/uL (ref 1.4–7.0)
Neutrophils: 55 %
Platelets: 223 10*3/uL (ref 150–450)
RBC: 4.05 x10E6/uL (ref 3.77–5.28)
RDW: 12.9 % (ref 11.7–15.4)
WBC: 6.1 10*3/uL (ref 3.4–10.8)

## 2019-03-30 ENCOUNTER — Encounter: Payer: Self-pay | Admitting: Internal Medicine

## 2019-03-30 LAB — LIPID PANEL
Chol/HDL Ratio: 4.1 ratio (ref 0.0–4.4)
Cholesterol, Total: 197 mg/dL (ref 100–199)
HDL: 48 mg/dL (ref >39)
LDL Chol Calc (NIH): 131 mg/dL — ABNORMAL HIGH (ref 0–99)
Triglycerides: 98 mg/dL (ref 0–149)
VLDL Cholesterol Cal: 18 mg/dL (ref 5–40)

## 2019-03-30 LAB — CMP14+EGFR
ALT: 14 IU/L (ref 0–32)
AST: 19 IU/L (ref 0–40)
Albumin/Globulin Ratio: 2.3 — ABNORMAL HIGH (ref 1.2–2.2)
Albumin: 4.3 g/dL (ref 3.7–4.7)
Alkaline Phosphatase: 53 IU/L (ref 39–117)
BUN/Creatinine Ratio: 32 — ABNORMAL HIGH (ref 12–28)
BUN: 18 mg/dL (ref 8–27)
Bilirubin Total: 0.6 mg/dL (ref 0.0–1.2)
CO2: 24 mmol/L (ref 20–29)
Calcium: 9.6 mg/dL (ref 8.7–10.3)
Chloride: 102 mmol/L (ref 96–106)
Creatinine, Ser: 0.57 mg/dL (ref 0.57–1.00)
GFR calc Af Amer: 103 mL/min/{1.73_m2} (ref >59)
GFR calc non Af Amer: 89 mL/min/{1.73_m2} (ref >59)
Globulin, Total: 1.9 g/dL (ref 1.5–4.5)
Glucose: 94 mg/dL (ref 65–99)
Potassium: 4.3 mmol/L (ref 3.5–5.2)
Sodium: 140 mmol/L (ref 134–144)
Total Protein: 6.2 g/dL (ref 6.0–8.5)

## 2019-03-30 LAB — TSH: TSH: 2.13 u[IU]/mL (ref 0.450–4.500)

## 2019-04-04 DIAGNOSIS — M546 Pain in thoracic spine: Secondary | ICD-10-CM | POA: Diagnosis not present

## 2019-04-04 DIAGNOSIS — S0990XA Unspecified injury of head, initial encounter: Secondary | ICD-10-CM | POA: Diagnosis not present

## 2019-04-16 ENCOUNTER — Encounter: Payer: Self-pay | Admitting: *Deleted

## 2019-04-19 ENCOUNTER — Encounter: Payer: Medicare Other | Admitting: Internal Medicine

## 2019-04-21 ENCOUNTER — Other Ambulatory Visit: Payer: Self-pay | Admitting: Family Medicine

## 2019-04-21 MED ORDER — VALACYCLOVIR HCL 1 G PO TABS: 1000.0000 mg | ORAL_TABLET | Freq: Two times a day (BID) | ORAL | 0 refills | Status: DC

## 2019-04-21 NOTE — Progress Notes (Signed)
Patient is calling in for valtrex for shingles

## 2019-04-24 ENCOUNTER — Telehealth: Payer: Self-pay | Admitting: Family Medicine

## 2019-04-24 NOTE — Chronic Care Management (AMB) (Signed)
  Chronic Care Management   Outreach Note  04/24/2019 Name: Kamiylah Meece MRN: [id number] DOB: 1940/03/10  Mahkayla Hubbs is a 79 y.o. year old female who is a primary care patient of Dettinger, Fransisca Kaufmann, MD. I reached out to Thea Gist by phone today in response to a referral sent by Ms. [employer]'s health plan.     An unsuccessful telephone outreach was attempted today. The patient was referred to the case management team for assistance with care management and care coordination.   Follow Up Plan: A HIPPA compliant phone message was left for the patient providing contact information and requesting a return call.  The care management team will reach out to the patient again over the next 7 days.  If patient returns call to provider office, please advise to call Amelia  at [place], [place]  [place], [place] [address] Direct Dial: [phone] [email or web address]: [email or web address]

## 2019-04-30 ENCOUNTER — Other Ambulatory Visit: Payer: Self-pay

## 2019-04-30 ENCOUNTER — Ambulatory Visit (AMBULATORY_SURGERY_CENTER): Payer: Self-pay | Admitting: *Deleted

## 2019-04-30 VITALS — Temp 97.3°F | Ht 69.0 in | Wt 156.4 lb

## 2019-04-30 DIAGNOSIS — Z86010 Personal history of colonic polyps: Secondary | ICD-10-CM

## 2019-04-30 MED ORDER — SUPREP BOWEL PREP KIT 17.5-3.13-1.6 GM/177ML PO SOLN: 1.0000 | Freq: Once | ORAL | 0 refills | Status: AC

## 2019-04-30 NOTE — Progress Notes (Signed)
Patient denies any allergies to egg or soy products. Patient denies complications with anesthesia/sedation.  Patient denies oxygen use at home and denies diet medications. Emmi instructions for colonoscopy explained and given to patient.  

## 2019-05-01 NOTE — Chronic Care Management (AMB) (Signed)
  Chronic Care Management   Note  05/01/2019 Name: [name] MRN: [id number] DOB: Apr 06, 1940  [name] is a 79 y.o. year old female who is a primary care patient of Dettinger, Fransisca Kaufmann, MD. I reached out to Thea Gist by phone today in response to a referral sent by [name]'s health plan.     [name] was given information about Chronic Care Management services today including:  1. CCM service includes personalized support from designated clinical staff supervised by her physician, including individualized plan of care and coordination with other care providers 2. 24/7 contact phone numbers for assistance for urgent and routine care needs. 3. Service will only be billed when office clinical staff spend 20 minutes or more in a month to coordinate care. 4. Only one practitioner may furnish and bill the service in a calendar month. 5. The patient may stop CCM services at any time (effective at the end of the month) by phone call to the office staff. 6. The patient will be responsible for cost sharing (co-pay) of up to 20% of the service fee (after annual deductible is met).  Patient did not agree to enrollment in care management services and does not wish to consider at this time.  Follow up plan: The patient has been provided with contact information for the care management team and has been advised to call with any health related questions or concerns.   Noreene Larsson, [place], [place],  [address] Direct Dial: [phone] [email or web address]

## 2019-05-02 DIAGNOSIS — M7541 Impingement syndrome of right shoulder: Secondary | ICD-10-CM | POA: Diagnosis not present

## 2019-05-02 DIAGNOSIS — M50323 Other cervical disc degeneration at C6-C7 level: Secondary | ICD-10-CM | POA: Diagnosis not present

## 2019-05-02 DIAGNOSIS — M438X2 Other specified deforming dorsopathies, cervical region: Secondary | ICD-10-CM | POA: Diagnosis not present

## 2019-05-02 DIAGNOSIS — M50322 Other cervical disc degeneration at C5-C6 level: Secondary | ICD-10-CM | POA: Diagnosis not present

## 2019-05-02 DIAGNOSIS — M25511 Pain in right shoulder: Secondary | ICD-10-CM | POA: Diagnosis not present

## 2019-05-05 ENCOUNTER — Emergency Department (HOSPITAL_COMMUNITY): Payer: Medicare Other

## 2019-05-05 ENCOUNTER — Emergency Department (HOSPITAL_COMMUNITY)
Admission: EM | Admit: 2019-05-05 | Discharge: 2019-05-05 | Discharge status: Against Medical Advice | Payer: Medicare Other | Attending: Emergency Medicine | Admitting: Emergency Medicine

## 2019-05-05 ENCOUNTER — Encounter (HOSPITAL_COMMUNITY): Payer: Self-pay | Admitting: *Deleted

## 2019-05-05 ENCOUNTER — Other Ambulatory Visit: Payer: Self-pay

## 2019-05-05 DIAGNOSIS — Z853 Personal history of malignant neoplasm of breast: Secondary | ICD-10-CM | POA: Diagnosis not present

## 2019-05-05 DIAGNOSIS — Y998 Other external cause status: Secondary | ICD-10-CM | POA: Insufficient documentation

## 2019-05-05 DIAGNOSIS — I10 Essential (primary) hypertension: Secondary | ICD-10-CM | POA: Diagnosis not present

## 2019-05-05 DIAGNOSIS — R296 Repeated falls: Secondary | ICD-10-CM | POA: Diagnosis not present

## 2019-05-05 DIAGNOSIS — Z5320 Procedure and treatment not carried out because of patient's decision for unspecified reasons: Secondary | ICD-10-CM | POA: Diagnosis not present

## 2019-05-05 DIAGNOSIS — W19XXXA Unspecified fall, initial encounter: Secondary | ICD-10-CM | POA: Insufficient documentation

## 2019-05-05 DIAGNOSIS — R1084 Generalized abdominal pain: Secondary | ICD-10-CM | POA: Diagnosis not present

## 2019-05-05 DIAGNOSIS — R42 Dizziness and giddiness: Secondary | ICD-10-CM | POA: Diagnosis not present

## 2019-05-05 DIAGNOSIS — E039 Hypothyroidism, unspecified: Secondary | ICD-10-CM | POA: Insufficient documentation

## 2019-05-05 DIAGNOSIS — Y939 Activity, unspecified: Secondary | ICD-10-CM | POA: Diagnosis not present

## 2019-05-05 DIAGNOSIS — S0990XA Unspecified injury of head, initial encounter: Secondary | ICD-10-CM | POA: Diagnosis not present

## 2019-05-05 DIAGNOSIS — Y929 Unspecified place or not applicable: Secondary | ICD-10-CM | POA: Insufficient documentation

## 2019-05-05 NOTE — ED Triage Notes (Signed)
States she fell in the bathtub, fell against the commode and the garage door hit her in the head in the last 3 weeks

## 2019-05-05 NOTE — ED Provider Notes (Signed)
Emergency Department Provider Note   I have reviewed the triage vital signs and the nursing notes.   HISTORY  Chief Complaint Head Injury and Dizziness   HPI [name] is a 79 y.o. female with past medical history reviewed below presents to the emergency department with multiple head injuries in the last several weeks.  Patient states that she sustained several falls and that her family is concerned about these.  Her last fall was 3 days ago.  She does not lose consciousness.  She does not feel lightheaded or have chest pain, heart palpitations prior to falling.  No seizure-like activity.   Patient states that while this is a concern she actually finally decided to come up today because she developed sudden severe abdominal pain.  She developed pain across her abdomen and felt like she was getting a kidney stone.  When she told her family about this they told her to also get her head checked out.  From triage, a CT scan of the head was ordered.  Patient states that since urinating her pain has completely resolved.  She has not had vomiting or diarrhea.  She is not experiencing dysuria, hesitancy, urgency.  She denies any back or flank pain.  No fevers or chills.     Past Medical History:  Diagnosis Date  . Anal or rectal pain   . Anxiety state, unspecified   . Arthritis   . Arthropathy, unspecified, site unspecified   . Carpal tunnel syndrome    Bilateral  . Cataract   . Chronic airway obstruction, not elsewhere classified    patient denies on 02/27/14  . Esophageal reflux   . Esophageal stricture   . Hypertension   . Internal hemorrhoids without mention of complication   . Malignant neoplasm of breast (female), unspecified site    left breast   . Memory disorder 09/24/2013  . Nocturnal leg cramps 06/29/2018  . Osteopenia   . Other and unspecified hyperlipidemia   . Pain in joint, multiple sites   . Personal history of colonic polyps    adenomatous  . Polyneuropathy in  other diseases classified elsewhere ([place]) 09/24/2013  . Shingles   . Sleep apnea    no longer uses cpap  . Tubular adenoma of colon   . Unspecified hypothyroidism     Patient Active Problem List   Diagnosis Date Noted  . Bradycardia 02/28/2019  . Educated about COVID-19 virus infection 02/28/2019  . Nocturnal leg cramps 06/29/2018  . Aortic atherosclerosis ([place]) 11/08/2016  . Thoracic aortic ectasia ([place]) 11/08/2016  . 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 Polyneuropathy in other diseases classified elsewhere ([place]) 09/24/2013  . Memory disorder 09/24/2013  . Sleep apnea 05/15/2013  . Hypothyroidism, postsurgical 05/15/2013  . HTN (hypertension) 05/15/2013  . Vitamin D deficiency 05/15/2013  . Hyperlipidemia 05/15/2013  . Osteopenia 10/25/2012  . Mild cognitive impairment with memory loss 09/27/2012  . Trigeminal neuralgia 04/02/2011  . Dysphagia 07/23/2010  . GERD (gastroesophageal reflux disease) 07/23/2010  . ANXIETY 05/24/2008  . COPD (chronic obstructive pulmonary disease) ([place]) 05/24/2008  . ARTHRITIS 05/24/2008  . Idiopathic osteoporosis 05/24/2008  . Malignant neoplasm of upper-outer quadrant of left female breast ([place]) 05/22/2008  . INTERNAL HEMORRHOIDS 05/22/2008  . COLONIC POLYPS, ADENOMATOUS, HX OF 05/22/2008    Past Surgical History:  Procedure Laterality Date  . APPENDECTOMY  03/2014  . BACK SURGERY     X2  . BREAST LUMPECTOMY    . COLONOSCOPY  08/03/2018  . EYE SURGERY     cataracts bilateral  . LAPAROSCOPIC APPENDECTOMY N/A 02/28/2014   Procedure: APPENDECTOMY LAPAROSCOPIC PARTIAL CECECTOMY FOR MUCOCELE OF APPENDIX ;  Surgeon: Michael Boston, MD;  Location: [facility]S;  Service: General;  Laterality: N/A;  . MASTECTOMY     left  . THYROIDECTOMY    . TUBAL LIGATION      Allergies Atorvastatin, Meperidine hcl, Statins, Norco [hydrocodone-acetaminophen], and Sulfonamide derivatives  Family History  Problem Relation Age  of Onset  . Bipolar disorder Sister   . Early death Mother   . Early death Sister   . Colon cancer Neg Hx   . Esophageal cancer Neg Hx   . Rectal cancer Neg Hx   . Stomach cancer Neg Hx     Social History Social History   Tobacco Use  . Smoking status: Never Smoker  . Smokeless tobacco: Never Used  Substance Use Topics  . Alcohol use: No    Alcohol/week: 0.0 standard drinks  . Drug use: No    Review of Systems  Constitutional: No fever/chills Eyes: No visual changes. ENT: No sore throat. Cardiovascular: Denies chest pain. Respiratory: Baseline shortness of breath with COPD history.  Gastrointestinal: Positive abdominal pain. Positive nausea, no vomiting.  No diarrhea.  No constipation. Genitourinary: Negative for dysuria. Musculoskeletal: Negative for back pain. Skin: Negative for rash. Neurological: Negative for  focal weakness or numbness. Positive recent falls with head injury.   10-point ROS otherwise negative.  ____________________________________________   PHYSICAL EXAM:  VITAL SIGNS: ED Triage Vitals  Enc Vitals Group     BP 05/05/19 1748 122/79     Pulse Rate 05/05/19 1748 (!) 59     Resp 05/05/19 1748 20     Temp 05/05/19 1748 98.2 F (36.8 C)     Temp src --      SpO2 05/05/19 1748 98 %   Constitutional: Alert and oriented. Well appearing and in no acute distress. Eyes: Conjunctivae are normal. PERRL. EOMI. Head: Atraumatic. Nose: No congestion/rhinnorhea. Mouth/Throat: Mucous membranes are moist.   Neck: No stridor.  Cardiovascular: Normal rate, regular rhythm. Good peripheral circulation. Grossly normal heart sounds.   Respiratory: Normal respiratory effort.  No retractions. Lungs CTAB. Gastrointestinal: Soft and nontender. No distention.  Musculoskeletal: No lower extremity tenderness nor edema. No gross deformities of extremities. Neurologic:  Normal speech and language. No gross focal neurologic deficits are appreciated.  Normal cranial  nerve exam 2 through 12.  Patient with equal grip strength and normal finger-to-nose testing.  No drift of the upper or lower extremities.  Normal sensation in the bilateral face and extremities.  Skin:  Skin is warm, dry and intact. No rash noted.   ____________________________________________  RADIOLOGY  CT Head Wo Contrast  Result Date: 05/05/2019 CLINICAL DATA:  Headache, head trauma. Multiple falls over the last 3 weeks. No loss of consciousness. EXAM: CT HEAD WITHOUT CONTRAST TECHNIQUE: Contiguous axial images were obtained from the base of the skull through the vertex without intravenous contrast. COMPARISON:  Head CT 08/26/2017 FINDINGS: Brain: No intracranial hemorrhage, mass effect, or midline shift. Brain volume is normal for age. No hydrocephalus. The basilar cisterns are patent. Mild chronic small vessel ischemia, similar to prior exam. No evidence of territorial infarct or acute ischemia. No extra-axial or intracranial fluid collection. Vascular: Atherosclerosis of skullbase vasculature without hyperdense vessel or abnormal calcification. Skull: No fracture or focal lesion. Sinuses/Orbits: Paranasal sinuses and mastoid air cells are clear. The visualized orbits are unremarkable. Bilateral  cataract resection. Other: No confluent scalp contusion. IMPRESSION: 1. No acute intracranial abnormality. No skull fracture. 2. Mild chronic small vessel ischemia. Electronically Signed   By: Keith Rake M.D.   On: 05/05/2019 18:52    ____________________________________________   PROCEDURES  Procedure(s) performed:   Procedures  None  ____________________________________________   INITIAL IMPRESSION / ASSESSMENT AND PLAN / ED COURSE  Pertinent labs & imaging results that were available during my care of the patient were reviewed by me and considered in my medical decision making (see chart for details).   Patient presents to the emergency department with multiple complaints.  The  triage note mentions multiple falls with head injury and concern for this primarily.  The patient states that actually she is not overly concerned about that but her family is.  She presents today with abdominal pain which has since resolved.  She has a completely soft and nontender abdomen.  She has baseline shortness of breath with COPD.  Despite the resolution of her symptoms, the sudden onset presentation and the patient's age I recommended obtaining basic lab work, EKG, troponins, and UA.  The patient did describe some possible chest discomfort yesterday.   In discussing my plan for additional lab testing given the new history the patient is very resistant to any further testing.  She states she is due to have a colonoscopy next week and that they will be obtaining many of those labs at that time.  I discussed my concern that we could be missing some underlying pathology in the abdomen that would require acute intervention, surgery.  She is also describing some possible chest discomfort in the past several days but is refusing EKG, chest x-ray, troponins.  We discussed that missing a potential process in the abdomen or heart issue could lead to significant morbidity and even mortality.  Patient verbalizes understanding of this but states that her daughter has been waiting on the car and needs to get back.  I offered to engage the daughter to see if she could go home and come back when the test have resulted the patient is refusing this.  She is insisting on leaving at this time.  She will sign out [facility].  I encouraged her to call her PCP first thing tomorrow and return to the emergency department at any time should she change her mind about further treatment and/or testing but especially if symptoms return or worsen.    ____________________________________________  FINAL CLINICAL IMPRESSION(S) / ED DIAGNOSES  Final diagnoses:  Injury of head, initial encounter  Dizzy  Generalized  abdominal pain    Note:  This document was prepared using Dragon voice recognition software and may include unintentional dictation errors.  Nanda Quinton, MD, [facility] Emergency Medicine    Irmalee Riemenschneider, [facility], MD 05/05/19 2029

## 2019-05-05 NOTE — Discharge Instructions (Signed)
You were seen in the emergency department today after dizziness with head injury.  You also mention some sudden abdominal pain and weakness.  Your CT scan of the head is normal other than some age-related brain changes.  Your exam today is not consistent with stroke.  We discussed performing further tests such as blood tests, chest x-ray, EKG especially given your sudden abdominal pain today and weakness.  I wanted to look more closely at your heart and give IV fluids.  You are deciding to leave [place].  You understand that we could be missing a potentially more serious diagnosis such as something requiring surgery in the belly, severe dehydration, or even issues with your heart.  Failing to diagnose the soon could lead to significant disability or even death.  Please call your primary care doctor first thing on Monday to schedule a follow-up appointment.  If your symptoms return or you change your mind about further tests please return to the emergency department immediately for reevaluation and we be happy to see you again.

## 2019-05-09 ENCOUNTER — Ambulatory Visit: Payer: Medicare Other | Admitting: Cardiovascular Disease

## 2019-05-09 ENCOUNTER — Other Ambulatory Visit: Payer: Self-pay

## 2019-05-09 ENCOUNTER — Encounter: Payer: Self-pay | Admitting: Cardiovascular Disease

## 2019-05-09 ENCOUNTER — Ambulatory Visit (INDEPENDENT_AMBULATORY_CARE_PROVIDER_SITE_OTHER): Payer: Medicare Other

## 2019-05-09 ENCOUNTER — Ambulatory Visit: Payer: Medicare Other | Attending: Internal Medicine

## 2019-05-09 DIAGNOSIS — G4733 Obstructive sleep apnea (adult) (pediatric): Secondary | ICD-10-CM

## 2019-05-09 DIAGNOSIS — I10 Essential (primary) hypertension: Secondary | ICD-10-CM | POA: Diagnosis not present

## 2019-05-09 DIAGNOSIS — R0683 Snoring: Secondary | ICD-10-CM

## 2019-05-09 DIAGNOSIS — G4719 Other hypersomnia: Secondary | ICD-10-CM

## 2019-05-09 DIAGNOSIS — G4730 Sleep apnea, unspecified: Secondary | ICD-10-CM

## 2019-05-09 DIAGNOSIS — R001 Bradycardia, unspecified: Secondary | ICD-10-CM

## 2019-05-09 DIAGNOSIS — E7800 Pure hypercholesterolemia, unspecified: Secondary | ICD-10-CM

## 2019-05-09 DIAGNOSIS — Z20822 Contact with and (suspected) exposure to covid-19: Secondary | ICD-10-CM

## 2019-05-09 DIAGNOSIS — Z1159 Encounter for screening for other viral diseases: Secondary | ICD-10-CM

## 2019-05-09 NOTE — Progress Notes (Signed)
Cardiology Office Note    Date:  05/09/2019   ID:  [name], DOB 1940-11-18, MRN [id number]  PCP:  Dettinger, Fransisca Kaufmann, MD  Cardiologist:  Shelva Majestic, MD (Sleep); Dr. Percival Spanish  New sleep evaluation referred by Dr. Percival Spanish.  History of Present Illness:  [name] is a 79 y.o. female who is followed by Dr. Percival Spanish.  She has a history of hypertension, hyperlipidemia, hypothyroidism, as well as GERD.  She has remote history of atypical chest pain and had a negative perfusion study in 2017.  In August 2019 she experienced presyncope.  She developed Covid the first week in January and it took her 3 to 4 weeks to feel better.  She was recently evaluated by Dr. Percival Spanish on February 28, 2019 and complained of significant fatigability.  She had a remote history of diagnosed sleep apnea by sleep study approximately  8-10 years ago.  Apparently she was on CPAP for 4 to 5 years but has been off CPAP therapy for at least 4 years.  Presently she admits to significant fatigability and very poor sleep.  She admits to excessive daytime sleepiness.  She typically goes to bed at 10 PM and wakes up between 5 and 6 AM.  She experiences nocturia at least 3-4 times per night.  Her sleep is nonrestorative.  She is aware of snoring.  An Epworth Sleepiness Scale score was calculated in the office today and this endorsed at 13 as shown below:  Epworth Sleepiness Scale: Situation   Chance of Dozing/Sleeping (0 = never , 1 = slight chance , 2 = moderate chance , 3 = high chance )   sitting and reading 2   watching TV 2   sitting inactive in a public place 2   being a passenger in a motor vehicle for an hour or more 2   lying down in the afternoon 1   sitting and talking to someone 1   sitting quietly after lunch (no alcohol) 2   while stopped for a few minutes in traffic as the driver 1   Total Score  13   She is unaware of any bruxism, hypnagogic hallucinations, or cataplectic events.  Past Medical  History:  Diagnosis Date  . Anal or rectal pain   . Anxiety state, unspecified   . Arthritis   . Arthropathy, unspecified, site unspecified   . Carpal tunnel syndrome    Bilateral  . Cataract   . Chronic airway obstruction, not elsewhere classified    patient denies on 02/27/14  . Esophageal reflux   . Esophageal stricture   . Hypertension   . Internal hemorrhoids without mention of complication   . Malignant neoplasm of breast (female), unspecified site    left breast   . Memory disorder 09/24/2013  . Nocturnal leg cramps 06/29/2018  . Osteopenia   . Other and unspecified hyperlipidemia   . Pain in joint, multiple sites   . Personal history of colonic polyps    adenomatous  . Polyneuropathy in other diseases classified elsewhere ([place]) 09/24/2013  . Shingles   . Sleep apnea    no longer uses cpap  . Tubular adenoma of colon   . Unspecified hypothyroidism     Past Surgical History:  Procedure Laterality Date  . APPENDECTOMY  03/2014  . BACK SURGERY     X2  . BREAST LUMPECTOMY    . COLONOSCOPY  08/03/2018  . EYE SURGERY     cataracts bilateral  . LAPAROSCOPIC APPENDECTOMY  N/A 02/28/2014   Procedure: APPENDECTOMY LAPAROSCOPIC PARTIAL CECECTOMY FOR MUCOCELE OF APPENDIX ;  Surgeon: Michael Boston, MD;  Location: [facility];  Service: General;  Laterality: N/A;  . MASTECTOMY     left  . THYROIDECTOMY    . TUBAL LIGATION      Current Medications: Outpatient Medications Prior to Visit  Medication Sig Dispense Refill  . aspirin 81 MG tablet Take 81 mg by mouth 3 (three) times a week.     . busPIRone (BUSPAR) 30 MG tablet TAKE (1/2) TO (1) TABLET BY MOUTH ONCE DAILY AS NEEDED 30 tablet 0  . Calcium Carbonate-Vitamin D (CALCIUM 500/D) 500-125 MG-UNIT TABS Take 1 capsule by mouth daily.      . Cyanocobalamin (VITAMIN B 12 PO) Take 1 tablet by mouth daily.     Marland Kitchen donepezil (ARICEPT) 10 MG tablet Take 1 tablet (10 mg total) by mouth at bedtime. 90 tablet 3  . ezetimibe (ZETIA) 10 MG  tablet TAKE 1 TABLET BY MOUTH ONCE DAILY. 90 tablet 0  . gabapentin (NEURONTIN) 300 MG capsule TAKE 1 CAPSULE EACH MORNING AND 2 AT BEDTIME. 270 capsule 3  . ibuprofen (ADVIL,MOTRIN) 200 MG tablet Take 800 mg by mouth every 6 (six) hours as needed (Pain).     Marland Kitchen levothyroxine (SYNTHROID, LEVOTHROID) 112 MCG tablet Take 1 tablet (112 mcg total) by mouth daily before breakfast. 90 tablet 3  . lisinopril-hydrochlorothiazide (ZESTORETIC) 10-12.5 MG tablet Take 1 tablet by mouth daily. 90 tablet 1  . Multiple Vitamin (MULTIVITAMIN) capsule Take 1 capsule by mouth daily.      . Omega-3 Fatty Acids (FISH OIL) 1000 MG CAPS Take 1 capsule by mouth daily.      . DULoxetine (CYMBALTA) 20 MG capsule Take 1 twice a day (Patient taking differently: Take 20 mg by mouth 2 (two) times daily. Take 1 twice a day) 180 capsule 3  . pantoprazole (PROTONIX) 40 MG tablet Take 1 tablet (40 mg total) by mouth daily. (Patient not taking: Reported on 05/09/2019) 30 tablet 0  . valACYclovir (VALTREX) 1000 MG tablet Take 1 tablet (1,000 mg total) by mouth 2 (two) times daily. (Patient not taking: Reported on 05/09/2019) 20 tablet 0  . baclofen (LIORESAL) 10 MG tablet Take by mouth.     No facility-administered medications prior to visit.     Allergies:   Atorvastatin, Meperidine hcl, Statins, Norco [hydrocodone-acetaminophen], and Sulfonamide derivatives   Social History   Socioeconomic History  . Marital status: Divorced    Spouse name: Not on file  . Number of children: 2  . Years of education: Not on file  . Highest education level: Not on file  Occupational History  . Occupation: Retired    Fish farm manager: RETIRED  Tobacco Use  . Smoking status: Never Smoker  . Smokeless tobacco: Never Used  Substance and Sexual Activity  . Alcohol use: No    Alcohol/week: 0.0 standard drinks  . Drug use: No  . Sexual activity: Never    Birth control/protection: Post-menopausal  Other Topics Concern  . Not on file  Social History  Narrative   Patient lives at home alone and she is divorced.   Retired.   Education business course.   Right handed.   Caffeine sometimes tea.    Social Determinants of Health   Financial Resource Strain:   . Difficulty of Paying Living Expenses:   Food Insecurity:   . Worried About Charity fundraiser in the Last Year:   . [employer]  of Food in the Last Year:   Transportation Needs:   . Film/video editor (Medical):   Marland Kitchen Lack of Transportation (Non-Medical):   Physical Activity:   . Days of Exercise per Week:   . Minutes of Exercise per Session:   Stress:   . Feeling of Stress :   Social Connections:   . Frequency of Communication with Friends and Family:   . Frequency of Social Gatherings with Friends and Family:   . Attends Religious Services:   . Active Member of Clubs or Organizations:   . Attends Archivist Meetings:   Marland Kitchen Marital Status:     She is divorced.  She has 2 children ages 16 and 15 and 2 grandchildren.  Family History:  The patient's family history includes Bipolar disorder in her sister; Early death in her mother and sister.  Her mother died at age 34 in a motor vehicle accident.  Father died at age 63.  1 sister died at age 78.   ROS General: Negative; No fevers, chills, or night sweats;  HEENT: Negative; No changes in vision or hearing, sinus congestion, difficulty swallowing Pulmonary: Negative; No cough, wheezing, shortness of breath, hemoptysis Cardiovascular: Remote history of atypical chest pain and presyncope; positive for hypertension and hyperlipidemia GI: Positive for GERD GU: Negative; No dysuria, hematuria, or difficulty voiding Musculoskeletal: Negative; no myalgias, joint pain, or weakness Hematologic/Oncology: Negative; no easy bruising, bleeding Endocrine: Positive for hypothyroidism Neuro: Negative; no changes in balance, headaches Skin: Negative; No rashes or skin lesions Psychiatric: Negative; No behavioral problems,  depression Sleep: See HPI: Other comprehensive 14 point system review is negative.   PHYSICAL EXAM:   VS:  BP 130/72   Pulse (!) 57   Ht '5\' 9"'$  (1.753 m)   Wt 157 lb 3.2 oz (71.3 kg)   LMP  (LMP Unknown)   SpO2 98%   BMI 23.21 kg/m     Repeat blood pressure was 128/70  Wt Readings from Last 3 Encounters:  05/09/19 157 lb 3.2 oz (71.3 kg)  04/30/19 156 lb 6.4 oz (70.9 kg)  03/28/19 160 lb (72.6 kg)    General: Alert, oriented, no distress.  Skin: normal turgor, no rashes, warm and dry HEENT: Normocephalic, atraumatic. Pupils equal round and reactive to light; sclera anicteric; extraocular muscles intact;  Nose without nasal septal hypertrophy Mouth/Parynx benign; Mallinpatti scale 3/4 Neck: No JVD, no carotid bruits; normal carotid upstroke Lungs: clear to ausculatation and percussion; no wheezing or rales Chest wall: without tenderness to palpitation Heart: PMI not displaced, RRR, s1 s2 normal, 1/6 systolic murmur, no diastolic murmur, no rubs, gallops, thrills, or heaves Abdomen: soft, nontender; no hepatosplenomehaly, BS+; abdominal aorta nontender and not dilated by palpation. Back: no CVA tenderness Pulses 2+ Musculoskeletal: full range of motion, normal strength, no joint deformities Extremities: no clubbing cyanosis or edema, Homan's sign negative  Neurologic: grossly nonfocal; Cranial nerves grossly wnl Psychologic: Normal mood and affect   Studies/Labs Reviewed:   EKG:  EKG is not ordered today.  I personally reviewed her last ECG from 02/28/2019 which reveals normal sinus rhythm at 66.  No ST segment changes.  No ectopy.  QTc interval 457 ms.  Recent Labs: BMP Latest Ref Rng & Units 03/28/2019 07/31/2018 06/09/2018  Glucose 65 - 99 mg/dL 94 - 93  BUN 8 - 27 mg/dL 18 - 15  Creatinine 0.57 - 1.00 mg/dL 0.57 0.60 0.64  BUN/Creat Ratio 12 - 28 32(H) - 23  Sodium 134 - 144  mmol/L 140 - 142  Potassium 3.5 - 5.2 mmol/L 4.3 - 4.1  Chloride 96 - 106 mmol/L 102 - 103    CO2 20 - 29 mmol/L 24 - 27  Calcium 8.7 - 10.3 mg/dL 9.6 - 9.3     Hepatic Function Latest Ref Rng & Units 03/28/2019 06/09/2018 02/24/2018  Total Protein 6.0 - 8.5 g/dL 6.2 6.5 6.6  Albumin 3.7 - 4.7 g/dL 4.3 4.5 4.7  AST 0 - 40 IU/L _0 ALT 0 - 32 IU/L _1 Alk Phosphatase 39 - 117 IU/L 53 46 48  Total Bilirubin 0.0 - 1.2 mg/dL 0.6 0.8 0.4  Bilirubin, Direct 0.00 - 0.40 mg/dL - - 0.10    CBC Latest Ref Rng & Units 03/28/2019 06/09/2018 02/24/2018  WBC 3.4 - 10.8 x10E3/uL 6.1 5.2 6.8  Hemoglobin 11.1 - 15.9 g/dL 12.6 12.9 13.0  Hematocrit 34.0 - 46.6 % 38.1 40.4 37.5  Platelets 150 - 450 x10E3/uL 223 225 254   Lab Results  Component Value Date   MCV 94 03/28/2019   MCV 94 06/09/2018   MCV 90 02/24/2018   Lab Results  Component Value Date   TSH 2.130 03/28/2019   Lab Results  Component Value Date   HGBA1C 5.2 09/28/2013     BNP No results found for: BNP  ProBNP No results found for: PROBNP   Lipid Panel     Component Value Date/Time   CHOL 197 03/28/2019 1507   CHOL 228 (H) 05/17/2012 1134   TRIG 98 03/28/2019 1507   TRIG 92 10/01/2015 1129   TRIG 88 05/17/2012 1134   HDL 48 03/28/2019 1507   HDL 61 10/01/2015 1129   HDL 55 05/17/2012 1134   CHOLHDL 4.1 03/28/2019 1507   LDLCALC 131 (H) 03/28/2019 1507   LDLCALC 161 (H) 09/28/2013 0823   LDLCALC 155 (H) 05/17/2012 1134   LDLDIRECT 164 (H) 06/07/2014 0858   LABVLDL 18 03/28/2019 1507    RADIOLOGY: CT Head Wo Contrast  Result Date: 05/05/2019 CLINICAL DATA:  Headache, head trauma. Multiple falls over the last 3 weeks. No loss of consciousness. EXAM: CT HEAD WITHOUT CONTRAST TECHNIQUE: Contiguous axial images were obtained from the base of the skull through the vertex without intravenous contrast. COMPARISON:  Head CT 08/26/2017 FINDINGS: Brain: No intracranial hemorrhage, mass effect, or midline shift. Brain volume is normal for age. No hydrocephalus. The basilar cisterns are patent. Mild chronic  small vessel ischemia, similar to prior exam. No evidence of territorial infarct or acute ischemia. No extra-axial or intracranial fluid collection. Vascular: Atherosclerosis of skullbase vasculature without hyperdense vessel or abnormal calcification. Skull: No fracture or focal lesion. Sinuses/Orbits: Paranasal sinuses and mastoid air cells are clear. The visualized orbits are unremarkable. Bilateral cataract resection. Other: No confluent scalp contusion. IMPRESSION: 1. No acute intracranial abnormality. No skull fracture. 2. Mild chronic small vessel ischemia. Electronically Signed   By: Keith Rake M.D.   On: 05/05/2019 18:52     Additional studies/ records that were reviewed today include:  I have reviewed the records of Dr. Percival Spanish, her ER evaluation and prior neurology evaluation.  ASSESSMENT:    No diagnosis found.   PLAN:  Ms. Karinda Cabriales is a 79 year old female who has a history of hypertension, hyperlipidemia, hypothyroidism, GERD, and remotely had experienced an episode of presyncope.  She also has history of peripheral neuropathy followed by neurology.  She has a history of previously documented obstructive sleep apnea and she believes she may  have had a sleep study done approximately 8 to 10 years ago.  At present, I do not have specifics regarding her sleep study.  By her report she states that she was on CPAP therapy for approximately 4 to 5 years but has been off CPAP therapy most likely for at least 4 years.  She has noticed progressive fatigability and she is aware that her sleep is very poor.  She typically has nocturia 3-4 times per night, her sleep is nonrestorative, she snores, and has residual daytime sleepiness.  He typically is in bed for 7 to 8 hours per night but wakes up still tired.  Presently I have recommended a reevaluation for sleep apnea and we will schedule her for split-night protocol.  I will try to obtain data regarding her prior sleep study for comparative  purposes.  I discussed with her new improvements in technology both in the CPAP machine as well as in array of masks now available with much greater comfort.  Her blood pressure today is controlled on lisinopril HCT 10/12.5 mg.  She is on levothyroxine for hypothyroidism.  Currently she is on Zetia for hyperlipidemia.  GERD is controlled with pantoprazole.  I will see her back in approximately 3 months for follow-up evaluation further recommendations were made at that time.    Medication Adjustments/Labs and Tests Ordered: Current medicines are reviewed at length with the patient today.  Concerns regarding medicines are outlined above.  Medication changes, Labs and Tests ordered today are listed in the Patient Instructions below. There are no Patient Instructions on file for this visit.   Signed, Shelva Majestic, MD  05/09/2019 11:44 AM    [facility] [address], [place], [place], [place]  [address] Phone: [phone]

## 2019-05-09 NOTE — Patient Instructions (Signed)
Medication Instructions:  CONTINUE WITH CURRENT MEDICATIONS. NO CHANGES.  *If you need a refill on your cardiac medications before your next appointment, please call your pharmacy*   Testing/Procedures: Your physician has recommended that you have a sleep study. This test records several body functions during sleep, including: brain activity, eye movement, oxygen and carbon dioxide blood levels, heart rate and rhythm, breathing rate and rhythm, the flow of air through your mouth and nose, snoring, body muscle movements, and chest and belly movement.     Follow-Up: At [facility], you and your health needs are our priority.  As part of our continuing mission to provide you with exceptional heart care, we have created designated Provider Care Teams.  These Care Teams include your primary Cardiologist (physician) and Advanced Practice Providers (APPs -  Physician Assistants and Nurse Practitioners) who all work together to provide you with the care you need, when you need it.  We recommend signing up for the patient portal called "MyChart".  Sign up information is provided on this After Visit Summary.  MyChart is used to connect with patients for Virtual Visits (Telemedicine).  Patients are able to view lab/test results, encounter notes, upcoming appointments, etc.  Non-urgent messages can be sent to your provider as well.   To learn more about what you can do with MyChart, go to [email or web address].    Your next appointment:   3 month(s)  The format for your next appointment:   In Person  Provider:   Thomas Kelly, MD    

## 2019-05-10 ENCOUNTER — Telehealth: Payer: Self-pay | Admitting: *Deleted

## 2019-05-10 LAB — NOVEL CORONAVIRUS, NAA: SARS-CoV-2, NAA: NOT DETECTED

## 2019-05-10 LAB — SARS-COV-2, NAA 2 DAY TAT

## 2019-05-10 NOTE — Telephone Encounter (Signed)
-----   Message from Roland Earl sent at 05/09/2019 12:17 PM EDT ----- Regarding: Sleep Study

## 2019-05-14 ENCOUNTER — Ambulatory Visit (AMBULATORY_SURGERY_CENTER): Payer: Medicare Other | Admitting: Internal Medicine

## 2019-05-14 ENCOUNTER — Other Ambulatory Visit: Payer: Self-pay

## 2019-05-14 ENCOUNTER — Encounter: Payer: Self-pay | Admitting: Internal Medicine

## 2019-05-14 VITALS — BP 152/53 | HR 54 | Temp 96.8°F | Resp 12 | Ht 69.0 in | Wt 156.0 lb

## 2019-05-14 DIAGNOSIS — Z86010 Personal history of colonic polyps: Secondary | ICD-10-CM

## 2019-05-14 DIAGNOSIS — D125 Benign neoplasm of sigmoid colon: Secondary | ICD-10-CM

## 2019-05-14 DIAGNOSIS — D123 Benign neoplasm of transverse colon: Secondary | ICD-10-CM

## 2019-05-14 MED ORDER — SODIUM CHLORIDE 0.9 % IV SOLN: 500.0000 mL | Freq: Once | INTRAVENOUS | Status: DC

## 2019-05-14 NOTE — Patient Instructions (Signed)
Handout on polyps & diverticulosis & hemorrhoids given to you today  Await pathology results on polyps removed    YOU HAD AN ENDOSCOPIC PROCEDURE TODAY AT [facility]:   Refer to the procedure report that was given to you for any specific questions about what was found during the examination.  If the procedure report does not answer your questions, please call your gastroenterologist to clarify.  If you requested that your care partner not be given the details of your procedure findings, then the procedure report has been included in a sealed envelope for you to review at your convenience later.  YOU SHOULD EXPECT: Some feelings of bloating in the abdomen. Passage of more gas than usual.  Walking can help get rid of the air that was put into your GI tract during the procedure and reduce the bloating. If you had a lower endoscopy (such as a colonoscopy or flexible sigmoidoscopy) you may notice spotting of blood in your stool or on the toilet paper. If you underwent a bowel prep for your procedure, you may not have a normal bowel movement for a few days.  Please Note:  You might notice some irritation and congestion in your nose or some drainage.  This is from the oxygen used during your procedure.  There is no need for concern and it should clear up in a day or so.  SYMPTOMS TO REPORT IMMEDIATELY:   Following lower endoscopy (colonoscopy or flexible sigmoidoscopy):  Excessive amounts of blood in the stool  Significant tenderness or worsening of abdominal pains  Swelling of the abdomen that is new, acute  Fever of 100F or higher    For urgent or emergent issues, a gastroenterologist can be reached at any hour by calling [phone]. Do not use MyChart messaging for urgent concerns.    DIET:  We do recommend a small meal at first, but then you may proceed to your regular diet.  Drink plenty of fluids but you should avoid alcoholic beverages for 24 hours.  ACTIVITY:   You should plan to take it easy for the rest of today and you should NOT DRIVE or use heavy machinery until tomorrow (because of the sedation medicines used during the test).    FOLLOW UP: Our staff will call the number listed on your records 48-72 hours following your procedure to check on you and address any questions or concerns that you may have regarding the information given to you following your procedure. If we do not reach you, we will leave a message.  We will attempt to reach you two times.  During this call, we will ask if you have developed any symptoms of COVID 19. If you develop any symptoms (ie: fever, flu-like symptoms, shortness of breath, cough etc.) before then, please call [phone].  If you test positive for Covid 19 in the 2 weeks post procedure, please call and report this information to [place].    If any biopsies were taken you will be contacted by phone or by letter within the next 1-3 weeks.  Please call us at [phone] if you have not heard about the biopsies in 3 weeks.    SIGNATURES/CONFIDENTIALITY: You and/or your care partner have signed paperwork which will be entered into your electronic medical record.  These signatures attest to the fact that that the information above on your After Visit Summary has been reviewed and is understood.  Full responsibility of the confidentiality of this discharge information lies  with you and/or your care-partner.

## 2019-05-14 NOTE — Progress Notes (Signed)
Called to room to assist during endoscopic procedure.  Patient ID and intended procedure confirmed with present staff. Received instructions for my participation in the procedure from the performing physician.  

## 2019-05-14 NOTE — Progress Notes (Signed)
A/ox3, pleased with MAC, report to RN 

## 2019-05-14 NOTE — Op Note (Signed)
[place] Patient Name: [name] Procedure Date: 05/14/2019 11:21 AM MRN: [id number] Endoscopist: Jerene Bears , MD Age: 79 Referring MD:  Date of Birth: Aug 02, 1940 Gender: Female Account #: [id number] Procedure:                Colonoscopy Indications:              High risk colon cancer surveillance: Personal                            history of non-advanced adenoma, Last colonoscopy:                            February 2016 Medicines:                Monitored Anesthesia Care Procedure:                Pre-Anesthesia Assessment:                           - Prior to the procedure, a History and Physical                            was performed, and patient medications and                            allergies were reviewed. The patient's tolerance of                            previous anesthesia was also reviewed. The risks                            and benefits of the procedure and the sedation                            options and risks were discussed with the patient.                            All questions were answered, and informed consent                            was obtained. Prior Anticoagulants: The patient has                            taken no previous anticoagulant or antiplatelet                            agents. ASA Grade Assessment: III - A patient with                            severe systemic disease. After reviewing the risks                            and benefits, the patient was deemed in  satisfactory condition to undergo the procedure.                           After obtaining informed consent, the colonoscope                            was passed under direct vision. Throughout the                            procedure, the patient's blood pressure, pulse, and                            oxygen saturations were monitored continuously. The                            Colonoscope was introduced through the anus and                             advanced to the cecum, identified by appendiceal                            orifice and ileocecal valve. The colonoscopy was                            performed without difficulty. The patient tolerated                            the procedure well. The quality of the bowel                            preparation was good. The ileocecal valve,                            appendiceal orifice, and rectum were photographed. Scope In: 11:36:29 AM Scope Out: 11:53:37 AM Scope Withdrawal Time: 0 hours 13 minutes 18 seconds  Total Procedure Duration: 0 hours 17 minutes 8 seconds  Findings:                 The digital rectal exam was normal.                           Two sessile polyps were found in the hepatic                            flexure. The polyps were 3 to 4 mm in size. These                            polyps were removed with a cold snare. Resection                            and retrieval were complete.                           Two sessile polyps were found in the transverse  colon. The polyps were 4 to 5 mm in size. These                            polyps were removed with a cold snare. Resection                            and retrieval were complete.                           A 4 mm polyp was found in the sigmoid colon. The                            polyp was sessile. The polyp was removed with a                            cold snare. Resection and retrieval were complete.                           Multiple small-mouthed diverticula were found in                            the sigmoid colon and descending colon.                           Internal hemorrhoids were found during                            retroflexion. The hemorrhoids were small. Complications:            No immediate complications. Estimated Blood Loss:     Estimated blood loss was minimal. Impression:               - Two 3 to 4 mm polyps at the hepatic flexure,                             removed with a cold snare. Resected and retrieved.                           - Two 4 to 5 mm polyps in the transverse colon,                            removed with a cold snare. Resected and retrieved.                           - One 4 mm polyp in the sigmoid colon, removed with                            a cold snare. Resected and retrieved.                           - Diverticulosis in the sigmoid colon and in the  descending colon.                           - Internal hemorrhoids. Recommendation:           - Patient has a contact number available for                            emergencies. The signs and symptoms of potential                            delayed complications were discussed with the                            patient. Return to normal activities tomorrow.                            Written discharge instructions were provided to the                            patient.                           - Resume previous diet.                           - Continue present medications.                           - Await pathology results.                           - No repeat colonoscopy due to age at next                            surveillance interval. Jerene Bears, MD 05/14/2019 11:56:40 AM This report has been signed electronically.

## 2019-05-14 NOTE — Progress Notes (Signed)
Temp check by:LS Vital check by:AG  The patient states no changes in medical or surgical history since pre-visit screening on 04/30/19

## 2019-05-16 ENCOUNTER — Telehealth: Payer: Self-pay

## 2019-05-16 ENCOUNTER — Encounter: Payer: Self-pay | Admitting: Cardiovascular Disease

## 2019-05-16 NOTE — Telephone Encounter (Signed)
  Follow up Call-  Call back number 05/14/2019 08/03/2018  Post procedure Call Back phone  # [phone]  Permission to leave phone message Yes Yes  Some recent data might be hidden     Patient questions:  Do you have a fever, pain , or abdominal swelling? No. Pain Score  0 *  Have you tolerated food without any problems? No.  Have you been able to return to your normal activities? No.  Do you have any questions about your discharge instructions: Diet   No. Medications  No. Follow up visit  No.  Do you have questions or concerns about your Care? No.  Actions: * If pain score is 4 or above: No action needed, pain <4.  1. Have you developed a fever since your procedure? no  2.   Have you had an respiratory symptoms (SOB or cough) since your procedure? no  3.   Have you tested positive for COVID 19 since your procedure no  4.   Have you had any family members/close contacts diagnosed with the COVID 19 since your procedure?  no   If yes to any of these questions please route to Joylene John, RN and Erenest Rasher, RN

## 2019-05-18 ENCOUNTER — Telehealth: Payer: Self-pay | Admitting: Family Medicine

## 2019-05-18 NOTE — Telephone Encounter (Signed)
Does this need to be repeated because it states that possible small masses could not be visualized? Please advise

## 2019-05-18 NOTE — Telephone Encounter (Signed)
Called patient LMTCB. 

## 2019-05-21 ENCOUNTER — Other Ambulatory Visit: Payer: Self-pay | Admitting: Family Medicine

## 2019-05-21 ENCOUNTER — Encounter: Payer: Self-pay | Admitting: Internal Medicine

## 2019-05-30 NOTE — Telephone Encounter (Signed)
Spoke to patient informed her that per results she does not need repeat just continue with yearly screenings. The dense tissue noted on exam has been noted on her exams for several years now and is more informational for the reading provider. Advised the patient to follow up with our office if she noticed any changes in breast - tenderness, pain, masses, lumps, or nipple changes/discharge.

## 2019-05-30 NOTE — Telephone Encounter (Signed)
LMTCB

## 2019-06-19 ENCOUNTER — Telehealth: Payer: Self-pay | Admitting: *Deleted

## 2019-06-19 NOTE — Telephone Encounter (Signed)
Mychart message sent to patient informing her of sleep study and covid appointment details.

## 2019-06-29 ENCOUNTER — Other Ambulatory Visit (HOSPITAL_COMMUNITY)
Admission: RE | Admit: 2019-06-29 | Discharge: 2019-06-29 | Disposition: A | Discharge status: Home / Self Care | Payer: Medicare Other | Source: Ambulatory Visit | Attending: Cardiovascular Disease | Admitting: Cardiovascular Disease

## 2019-06-29 ENCOUNTER — Other Ambulatory Visit: Payer: Self-pay

## 2019-06-29 DIAGNOSIS — Z20822 Contact with and (suspected) exposure to covid-19: Secondary | ICD-10-CM | POA: Diagnosis not present

## 2019-06-29 DIAGNOSIS — Z01812 Encounter for preprocedural laboratory examination: Secondary | ICD-10-CM | POA: Insufficient documentation

## 2019-06-29 NOTE — Progress Notes (Signed)
Called patient and left a message on her answering machine. Letting her know if she can get the message in time and get here by 4:00 she can still come for her appointment today. Patient came in for her injection. I told her to disregard my messages.

## 2019-06-30 LAB — SARS CORONAVIRUS 2 (TAT 6-24 HRS): SARS Coronavirus 2: NEGATIVE

## 2019-07-02 ENCOUNTER — Emergency Department (HOSPITAL_COMMUNITY): Payer: Medicare Other

## 2019-07-02 ENCOUNTER — Emergency Department (HOSPITAL_COMMUNITY)
Admission: EM | Admit: 2019-07-02 | Discharge: 2019-07-02 | Disposition: A | Discharge status: Home / Self Care | Payer: Medicare Other | Attending: Emergency Medicine | Admitting: Emergency Medicine

## 2019-07-02 DIAGNOSIS — I708 Atherosclerosis of other arteries: Secondary | ICD-10-CM | POA: Diagnosis not present

## 2019-07-02 DIAGNOSIS — R0789 Other chest pain: Secondary | ICD-10-CM | POA: Insufficient documentation

## 2019-07-02 DIAGNOSIS — I10 Essential (primary) hypertension: Secondary | ICD-10-CM | POA: Insufficient documentation

## 2019-07-02 DIAGNOSIS — J449 Chronic obstructive pulmonary disease, unspecified: Secondary | ICD-10-CM | POA: Insufficient documentation

## 2019-07-02 DIAGNOSIS — R110 Nausea: Secondary | ICD-10-CM | POA: Insufficient documentation

## 2019-07-02 DIAGNOSIS — M542 Cervicalgia: Secondary | ICD-10-CM | POA: Diagnosis present

## 2019-07-02 DIAGNOSIS — E039 Hypothyroidism, unspecified: Secondary | ICD-10-CM | POA: Diagnosis not present

## 2019-07-02 DIAGNOSIS — R0602 Shortness of breath: Secondary | ICD-10-CM | POA: Diagnosis not present

## 2019-07-02 DIAGNOSIS — J984 Other disorders of lung: Secondary | ICD-10-CM | POA: Diagnosis not present

## 2019-07-02 DIAGNOSIS — R519 Headache, unspecified: Secondary | ICD-10-CM | POA: Diagnosis not present

## 2019-07-02 DIAGNOSIS — M47816 Spondylosis without myelopathy or radiculopathy, lumbar region: Secondary | ICD-10-CM | POA: Diagnosis not present

## 2019-07-02 DIAGNOSIS — M436 Torticollis: Secondary | ICD-10-CM

## 2019-07-02 DIAGNOSIS — I6522 Occlusion and stenosis of left carotid artery: Secondary | ICD-10-CM | POA: Diagnosis not present

## 2019-07-02 DIAGNOSIS — M79602 Pain in left arm: Secondary | ICD-10-CM | POA: Insufficient documentation

## 2019-07-02 DIAGNOSIS — I700 Atherosclerosis of aorta: Secondary | ICD-10-CM | POA: Diagnosis not present

## 2019-07-02 DIAGNOSIS — R079 Chest pain, unspecified: Secondary | ICD-10-CM | POA: Diagnosis not present

## 2019-07-02 DIAGNOSIS — Z79899 Other long term (current) drug therapy: Secondary | ICD-10-CM | POA: Insufficient documentation

## 2019-07-02 DIAGNOSIS — I672 Cerebral atherosclerosis: Secondary | ICD-10-CM | POA: Diagnosis not present

## 2019-07-02 DIAGNOSIS — I6503 Occlusion and stenosis of bilateral vertebral arteries: Secondary | ICD-10-CM | POA: Diagnosis not present

## 2019-07-02 LAB — COMPREHENSIVE METABOLIC PANEL
ALT: 21 U/L (ref 0–44)
AST: 30 U/L (ref 15–41)
Albumin: 3.6 g/dL (ref 3.5–5.0)
Alkaline Phosphatase: 42 U/L (ref 38–126)
Anion gap: 12 (ref 5–15)
BUN: 17 mg/dL (ref 8–23)
CO2: 21 mmol/L — ABNORMAL LOW (ref 22–32)
Calcium: 8.7 mg/dL — ABNORMAL LOW (ref 8.9–10.3)
Chloride: 107 mmol/L (ref 98–111)
Creatinine, Ser: 0.65 mg/dL (ref 0.44–1.00)
GFR calc Af Amer: >60 mL/min (ref >60)
GFR calc non Af Amer: >60 mL/min (ref >60)
Glucose, Bld: 99 mg/dL (ref 70–99)
Potassium: 3.7 mmol/L (ref 3.5–5.1)
Sodium: 140 mmol/L (ref 135–145)
Total Bilirubin: 0.8 mg/dL (ref 0.3–1.2)
Total Protein: 6 g/dL — ABNORMAL LOW (ref 6.5–8.1)

## 2019-07-02 LAB — CBC WITH DIFFERENTIAL/PLATELET
Abs Immature Granulocytes: 0.04 10*3/uL (ref 0.00–0.07)
Basophils Absolute: 0.1 10*3/uL (ref 0.0–0.1)
Basophils Relative: 1 %
Eosinophils Absolute: 0.3 10*3/uL (ref 0.0–0.5)
Eosinophils Relative: 3 %
HCT: 38.2 % (ref 36.0–46.0)
Hemoglobin: 12.4 g/dL (ref 12.0–15.0)
Immature Granulocytes: 1 %
Lymphocytes Relative: 20 %
Lymphs Abs: 1.5 10*3/uL (ref 0.7–4.0)
MCH: 30.6 pg (ref 26.0–34.0)
MCHC: 32.5 g/dL (ref 30.0–36.0)
MCV: 94.3 fL (ref 80.0–100.0)
Monocytes Absolute: 0.6 10*3/uL (ref 0.1–1.0)
Monocytes Relative: 8 %
Neutro Abs: 5.1 10*3/uL (ref 1.7–7.7)
Neutrophils Relative %: 67 %
Platelets: 205 10*3/uL (ref 150–400)
RBC: 4.05 MIL/uL (ref 3.87–5.11)
RDW: 13.6 % (ref 11.5–15.5)
WBC: 7.6 10*3/uL (ref 4.0–10.5)
nRBC: 0 % (ref 0.0–0.2)

## 2019-07-02 LAB — I-STAT CHEM 8, ED
BUN: 18 mg/dL (ref 8–23)
Calcium, Ion: 0.99 mmol/L — ABNORMAL LOW (ref 1.15–1.40)
Chloride: 106 mmol/L (ref 98–111)
Creatinine, Ser: 0.5 mg/dL (ref 0.44–1.00)
Glucose, Bld: 99 mg/dL (ref 70–99)
HCT: 37 % (ref 36.0–46.0)
Hemoglobin: 12.6 g/dL (ref 12.0–15.0)
Potassium: 3.6 mmol/L (ref 3.5–5.1)
Sodium: 142 mmol/L (ref 135–145)
TCO2: 24 mmol/L (ref 22–32)

## 2019-07-02 LAB — PROTIME-INR
INR: 0.9 (ref 0.8–1.2)
Prothrombin Time: 12.1 seconds (ref 11.4–15.2)

## 2019-07-02 LAB — TROPONIN I (HIGH SENSITIVITY)
Troponin I (High Sensitivity): 5 ng/L (ref <18)
Troponin I (High Sensitivity): 5 ng/L (ref <18)

## 2019-07-02 MED ORDER — MORPHINE SULFATE (PF) 2 MG/ML IV SOLN
2.0000 mg | Freq: Once | INTRAVENOUS | Status: DC
  Filled 2019-07-02: qty 1

## 2019-07-02 MED ORDER — LIDOCAINE 5 % EX PTCH: 1.0000 | MEDICATED_PATCH | CUTANEOUS | 0 refills | Status: DC

## 2019-07-02 MED ORDER — TIZANIDINE HCL 4 MG PO TABS
4.0000 mg | ORAL_TABLET | Freq: Once | ORAL | Status: AC
  Administered 2019-07-02: 4 mg via ORAL
  Filled 2019-07-02: qty 1

## 2019-07-02 MED ORDER — ONDANSETRON HCL 4 MG/2ML IJ SOLN: 4.0000 mg | Freq: Once | INTRAMUSCULAR | Status: DC

## 2019-07-02 MED ORDER — TIZANIDINE HCL 4 MG PO TABS
4.0000 mg | ORAL_TABLET | Freq: Four times a day (QID) | ORAL | 0 refills | Status: DC | PRN
Start: 2019-07-02 — End: 2020-02-27

## 2019-07-02 MED ORDER — LIDOCAINE 5 % EX PTCH
1.0000 | MEDICATED_PATCH | CUTANEOUS | Status: DC
  Administered 2019-07-02: 1 via TRANSDERMAL
  Filled 2019-07-02: qty 1

## 2019-07-02 MED ORDER — IOHEXOL 350 MG/ML SOLN
130.0000 mL | Freq: Once | INTRAVENOUS | Status: AC | PRN
  Administered 2019-07-02: 130 mL via INTRAVENOUS

## 2019-07-02 MED ORDER — IBUPROFEN 400 MG PO TABS: 600.0000 mg | ORAL_TABLET | Freq: Once | ORAL | Status: DC

## 2019-07-02 MED ORDER — FENTANYL CITRATE (PF) 100 MCG/2ML IJ SOLN: 50.0000 ug | Freq: Once | INTRAMUSCULAR | Status: DC

## 2019-07-02 NOTE — ED Notes (Addendum)
CT will call when scanner is available.

## 2019-07-02 NOTE — ED Notes (Signed)
Pt verbalized understanding of discharge instructions. Follow up care reviewed, pt had no further questions. 

## 2019-07-02 NOTE — ED Provider Notes (Signed)
[facility] EMERGENCY DEPARTMENT Provider Note   CSN: [id number] Arrival date & time: 07/02/19  8416     History Chief Complaint  Patient presents with  . Neck Pain    [name] is a 79 y.o. female.  The history is provided by the patient, medical records and the EMS personnel. No language interpreter was used.  Neck Pain  [name] is a 79 y.o. female who presents to the Emergency Department complaining of neck pain. She presents the emergency department by EMS complaining of severe left sided neck pain that began this morning and woke her from sleep. Pain is described as sharp in nature. It started behind her left ear and goes throughout her entire neck and down to her chest and left arm. Pain is worse with movement. She has associated nausea. She is unsure if she is having any difficulty breathing. She took four, 325 mg aspirin. She received Zofran by EMS prior to ED arrival. No prior similar symptoms. She lives at home alone. Level V caveat due to distress.     Past Medical History:  Diagnosis Date  . Anal or rectal pain   . Anxiety state, unspecified   . Arthritis   . Arthropathy, unspecified, site unspecified   . Carpal tunnel syndrome    Bilateral  . Cataract   . Chronic airway obstruction, not elsewhere classified    patient denies on 02/27/14  . Esophageal reflux   . Esophageal stricture   . Hypertension   . Internal hemorrhoids without mention of complication   . Malignant neoplasm of breast (female), unspecified site    left breast   . Memory disorder 09/24/2013  . Nocturnal leg cramps 06/29/2018  . Osteopenia   . Other and unspecified hyperlipidemia   . Pain in joint, multiple sites   . Personal history of colonic polyps    adenomatous  . Polyneuropathy in other diseases classified elsewhere ([place]) 09/24/2013  . Shingles   . Sleep apnea    no longer uses cpap  . Tubular adenoma of colon   . Unspecified hypothyroidism     Patient  Active Problem List   Diagnosis Date Noted  . Bradycardia 02/28/2019  . Educated about COVID-19 virus infection 02/28/2019  . Nocturnal leg cramps 06/29/2018  . Aortic atherosclerosis ([place]) 11/08/2016  . Thoracic aortic ectasia ([place]) 11/08/2016  . 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 Polyneuropathy in other diseases classified elsewhere ([place]) 09/24/2013  . Memory disorder 09/24/2013  . Sleep apnea 05/15/2013  . Hypothyroidism, postsurgical 05/15/2013  . HTN (hypertension) 05/15/2013  . Vitamin D deficiency 05/15/2013  . Hyperlipidemia 05/15/2013  . Osteopenia 10/25/2012  . Mild cognitive impairment with memory loss 09/27/2012  . Trigeminal neuralgia 04/02/2011  . Dysphagia 07/23/2010  . GERD (gastroesophageal reflux disease) 07/23/2010  . ANXIETY 05/24/2008  . COPD (chronic obstructive pulmonary disease) ([place]) 05/24/2008  . ARTHRITIS 05/24/2008  . Idiopathic osteoporosis 05/24/2008  . Malignant neoplasm of upper-outer quadrant of left female breast ([place]) 05/22/2008  . INTERNAL HEMORRHOIDS 05/22/2008  . COLONIC POLYPS, ADENOMATOUS, HX OF 05/22/2008    Past Surgical History:  Procedure Laterality Date  . APPENDECTOMY  03/2014  . BACK SURGERY     X2  . BREAST LUMPECTOMY    . COLONOSCOPY  08/03/2018  . EYE SURGERY     cataracts bilateral  . LAPAROSCOPIC APPENDECTOMY N/A 02/28/2014   Procedure: APPENDECTOMY LAPAROSCOPIC PARTIAL CECECTOMY FOR MUCOCELE OF APPENDIX ;  Surgeon: Michael Boston,  MD;  Location: [facility] ORS;  Service: General;  Laterality: N/A;  . MASTECTOMY     left  . THYROIDECTOMY    . TUBAL LIGATION       OB History    Gravida  2   Para  2   Term  2   Preterm      AB      Living        SAB      TAB      Ectopic      Multiple      Live Births              Family History  Problem Relation Age of Onset  . Bipolar disorder Sister   . Early death Mother   . Early death Sister   . Colon cancer Neg Hx   .  Esophageal cancer Neg Hx   . Rectal cancer Neg Hx   . Stomach cancer Neg Hx     Social History   Tobacco Use  . Smoking status: Never Smoker  . Smokeless tobacco: Never Used  Vaping Use  . Vaping Use: Never used  Substance Use Topics  . Alcohol use: No    Alcohol/week: 0.0 standard drinks  . Drug use: No    Home Medications Prior to Admission medications   Medication Sig Start Date End Date Taking? Authorizing Provider  aspirin 81 MG tablet Take 81 mg by mouth daily.    Yes [provider]  busPIRone (BUSPAR) 30 MG tablet TAKE (1/2) TO (1) TABLET BY MOUTH ONCE DAILY AS NEEDED Patient taking differently: Take 15-30 mg by mouth daily as needed (anxiety).  02/23/16  Yes Chipper Herb, MD  Calcium Carbonate-Vitamin D (CALCIUM 500/D) 500-125 MG-UNIT TABS Take 1 capsule by mouth daily.     Yes [provider]  Cyanocobalamin (VITAMIN B 12 PO) Take 1 tablet by mouth daily.    Yes [provider]  dimenhyDRINATE (DRAMAMINE) 50 MG tablet Take 50 mg by mouth daily.    Yes [provider]  donepezil (ARICEPT) 10 MG tablet Take 1 tablet (10 mg total) by mouth at bedtime. 01/10/19  Yes Suzzanne Cloud, NP  DULoxetine (CYMBALTA) 20 MG capsule Take 1 twice a day Patient taking differently: Take 20 mg by mouth daily.  01/10/19  Yes Suzzanne Cloud, NP  ezetimibe (ZETIA) 10 MG tablet TAKE 1 TABLET BY MOUTH ONCE DAILY. Patient taking differently: Take 10 mg by mouth daily. TAKE 1 TABLET BY MOUTH ONCE DAILY. 05/21/19  Yes Dettinger, Fransisca Kaufmann, MD  gabapentin (NEURONTIN) 300 MG capsule TAKE 1 CAPSULE EACH MORNING AND 2 AT BEDTIME. Patient taking differently: Take 300-600 mg by mouth See admin instructions. TAKE 1 CAPSULE (300mg ) EACH MORNING AND 2 (600mg ) AT BEDTIME. 01/10/19  Yes Suzzanne Cloud, NP  ibuprofen (ADVIL,MOTRIN) 200 MG tablet Take 800 mg by mouth every 6 (six) hours as needed (Pain).    Yes [provider]  levothyroxine (SYNTHROID) 112 MCG tablet TAKE  ONE TABLET BY MOUTH EACH DAY BEFORE BREAKFAST Patient taking differently: Take 112 mcg by mouth daily before breakfast.  05/21/19  Yes Dettinger, Fransisca Kaufmann, MD  lisinopril-hydrochlorothiazide (ZESTORETIC) 10-12.5 MG tablet TAKE 1 TABLET BY MOUTH ONCE DAILY. Patient taking differently: Take 1 tablet by mouth daily.  05/21/19  Yes Dettinger, Fransisca Kaufmann, MD  Multiple Vitamin (MULTIVITAMIN) capsule Take 1 capsule by mouth daily.     Yes [provider]  Omega-3 Fatty Acids (Mineral  OIL) 1000 MG CAPS Take 1,000 mg by mouth daily.    Yes [provider]  lidocaine (LIDODERM) 5 % Place 1 patch onto the skin daily. Remove & Discard patch within 12 hours or as directed by MD 07/02/19   Quintella Reichert, MD  pantoprazole (PROTONIX) 40 MG tablet Take 1 tablet (40 mg total) by mouth daily. Patient not taking: Reported on 05/09/2019 08/03/18   Pyrtle, Lajuan Lines, MD  tiZANidine (ZANAFLEX) 4 MG tablet Take 1 tablet (4 mg total) by mouth every 6 (six) hours as needed for muscle spasms. 07/02/19   Quintella Reichert, MD  valACYclovir (VALTREX) 1000 MG tablet Take 1 tablet (1,000 mg total) by mouth 2 (two) times daily. Patient not taking: Reported on 05/09/2019 04/21/19   Dettinger, Fransisca Kaufmann, MD    Allergies    Atorvastatin, Meperidine hcl, Statins, Norco [hydrocodone-acetaminophen], and Sulfonamide derivatives  Review of Systems   Review of Systems  Musculoskeletal: Positive for neck pain.  All other systems reviewed and are negative.   Physical Exam Updated Vital Signs BP 119/63 (BP Location: Right Arm)   Pulse 66   Temp 97.9 F (36.6 C) (Oral)   Resp 15   Ht 5\' 9"  (1.753 m)   Wt 72.6 kg   LMP  (LMP Unknown)   SpO2 97%   BMI 23.63 kg/m   Physical Exam Vitals and nursing note reviewed.  Constitutional:      General: She is in acute distress.     Appearance: She is well-developed. She is ill-appearing.  HENT:     Head: Normocephalic and atraumatic.  Neck:     Comments: Left anteriolateral neck  with two discrete areas of fullness Cardiovascular:     Rate and Rhythm: Normal rate and regular rhythm.     Heart sounds: No murmur heard.   Pulmonary:     Effort: Pulmonary effort is normal. No respiratory distress.     Breath sounds: Normal breath sounds.  Abdominal:     Palpations: Abdomen is soft.     Tenderness: There is no abdominal tenderness. There is no guarding or rebound.  Musculoskeletal:        General: No tenderness.     Comments: Trace edema to bilateral lower extremities. 2+ DP and radial pulses bilaterally.  Skin:    General: Skin is warm and dry.  Neurological:     Mental Status: She is alert and oriented to person, place, and time.     Comments: Five out of five strength in all four extremities with sensation to light touch intact in all four extremities.  Psychiatric:        Behavior: Behavior normal.     ED Results / Procedures / Treatments   Labs (all labs ordered are listed, but only abnormal results are displayed) Labs Reviewed  COMPREHENSIVE METABOLIC PANEL - Abnormal; Notable for the following components:      Result Value   CO2 21 (*)    Calcium 8.7 (*)    Total Protein 6.0 (*)    All other components within normal limits  I-STAT CHEM 8, ED - Abnormal; Notable for the following components:   Calcium, Ion 0.99 (*)    All other components within normal limits  CBC WITH DIFFERENTIAL/PLATELET  PROTIME-INR  TROPONIN I (HIGH SENSITIVITY)  TROPONIN I (HIGH SENSITIVITY)    EKG EKG Interpretation  Date/Time:  Monday July 02 2019 08:09:35 EDT Ventricular Rate:  66 PR Interval:    QRS Duration: 101 QT Interval:  453 QTC Calculation: 475 R Axis:   81 Text Interpretation: Sinus rhythm Borderline right axis deviation LVH with secondary repolarization abnormality Confirmed by Quintella Reichert [phone]) on 07/02/2019 10:43:55 AM   Radiology CT Head Wo Contrast  Result Date: 07/02/2019 CLINICAL DATA:  Headache. EXAM: CT HEAD WITHOUT CONTRAST  TECHNIQUE: Contiguous axial images were obtained from the base of the skull through the vertex without intravenous contrast. COMPARISON:  May 05, 2019. FINDINGS: Brain: Mild chronic ischemic white matter disease is noted. No mass effect or midline shift is noted. Ventricular size is within normal limits. There is no evidence of mass lesion, hemorrhage or acute infarction. Vascular: No hyperdense vessel or unexpected calcification. Skull: Normal. Negative for fracture or focal lesion. Sinuses/Orbits: No acute finding. Other: None. IMPRESSION: Mild chronic ischemic white matter disease. No acute intracranial abnormality seen. Electronically Signed   By: Marijo Conception M.D.   On: 07/02/2019 09:24   CT Angio Neck W and/or Wo Contrast  Result Date: 07/02/2019 CLINICAL DATA:  Headache and neck pain.  Left neck mass. EXAM: CT ANGIOGRAPHY NECK TECHNIQUE: Multidetector CT imaging of the neck was performed using the standard protocol during bolus administration of intravenous contrast. Multiplanar CT image reconstructions and MIPs were obtained to evaluate the vascular anatomy. Carotid stenosis measurements (when applicable) are obtained utilizing NASCET criteria, using the distal internal carotid diameter as the denominator. CONTRAST:  134mL OMNIPAQUE IOHEXOL 350 MG/ML SOLN COMPARISON:  CT neck 06/20/2012 FINDINGS: Aortic arch: Extensive atherosclerotic calcification in the aortic arch without aneurysm. Four vessel arch with left vertebral artery origin from the arch. Atherosclerotic disease in the proximal great vessels and subclavian arteries bilaterally without flow limiting stenosis. Right carotid system: Right carotid widely patent without atherosclerotic disease or dissection. Left carotid system: Left carotid widely patent without significant stenosis. Mild atherosclerotic calcification proximal left internal carotid artery. Vertebral arteries: Moderate stenosis origin of left vertebral artery at the arch. Left  vertebral artery is patent to the basilar without significant stenosis. Right vertebral artery is dominant and widely patent to the basilar. Mild atherosclerotic calcification without stenosis distal right vertebral artery. Skeleton: No acute skeletal abnormality. Left-sided facet degeneration. Disc degeneration and mild spurring at C5-6 and C6-7. Other neck: Negative for mass or adenopathy in the neck. Upper chest: Mild scarring in the left lung apex. No acute abnormality. Left mastectomy. IMPRESSION: 1. No significant carotid stenosis in the neck. Mild atherosclerotic calcification left internal carotid artery. 2. Moderate stenosis origin of left vertebral artery at the aortic arch. Right vertebral artery patent without significant stenosis. 3. Negative for soft tissue mass in the neck. 4.  Aortic Atherosclerosis (ICD10-I70.0). Electronically Signed   By: Franchot Gallo M.D.   On: 07/02/2019 09:30   DG Chest Port 1 View  Result Date: 07/02/2019 CLINICAL DATA:  Chest pain and shortness of breath EXAM: PORTABLE CHEST 1 VIEW COMPARISON:  February 24, 2018 FINDINGS: There is no edema or airspace opacity. Heart size and pulmonary vascularity are normal. No adenopathy. There is aortic atherosclerosis. No bone lesions. IMPRESSION: Lungs clear. Heart size normal. Aortic Atherosclerosis (ICD10-I70.0). Electronically Signed   By: Lowella Grip III M.D.   On: 07/02/2019 08:36   CT Angio Chest/Abd/Pel for Dissection W and/or W/WO  Result Date: 07/02/2019 CLINICAL DATA:  Neck and chest pain, initial encounter EXAM: CT ANGIOGRAPHY CHEST, ABDOMEN AND PELVIS TECHNIQUE: Non-contrast CT of the chest was initially obtained. Multidetector CT imaging through the chest, abdomen and pelvis was performed using the standard protocol during bolus  administration of intravenous contrast. Multiplanar reconstructed images and MIPs were obtained and reviewed to evaluate the vascular anatomy. CONTRAST:  154mL OMNIPAQUE IOHEXOL 350  MG/ML SOLN COMPARISON:  None. FINDINGS: CTA CHEST FINDINGS Cardiovascular: Initial noncontrast study of the chest was obtained and reveals significant atherosclerotic calcifications of the aorta. No aneurysmal dilatation is noted. Subsequent contrast enhanced imaging shows variant anatomy with the left vertebral artery arising directly from the aorta. Atherosclerotic changes in the aorta are seen without aneurysmal dilatation or dissection. Coronary calcifications are seen. No cardiac enlargement is noted. The pulmonary artery as visualized shows no definitive pulmonary embolism although timing was not optimum for embolus evaluation. Descending thoracic aorta is within normal limits. Mediastinum/Nodes: The esophagus is within normal limits. No sizable hilar or mediastinal adenopathy is noted. The thoracic inlet is within normal limits. Lungs/Pleura: Lungs are well aerated bilaterally. No focal infiltrate or sizable effusion is seen. Minimal scarring is noted bilaterally. No sizable parenchymal nodules are seen. Musculoskeletal: Degenerative changes of the thoracic spine are no acute rib abnormality is noted. Review of the MIP images confirms the above findings. CTA ABDOMEN AND PELVIS FINDINGS VASCULAR Aorta: Abdominal aorta demonstrates atherosclerotic calcifications without aneurysmal dilatation or dissection. Celiac: Mild narrowing is noted proximally without significant poststenotic dilatation. No significant flow limitation is noted. SMA: Patent without evidence of aneurysm, dissection, vasculitis or significant stenosis. Renals: Both renal arteries are patent without evidence of aneurysm, dissection, vasculitis, fibromuscular dysplasia or significant stenosis. Dual renal arteries are noted on the left. IMA: Patent without evidence of aneurysm, dissection, vasculitis or significant stenosis. Iliacs: Patent without evidence of aneurysm, dissection, vasculitis or significant stenosis. Veins: No specific venous  abnormality is noted. Review of the MIP images confirms the above findings. NON-VASCULAR Hepatobiliary: Fatty infiltration of the liver is noted. Cholelithiasis is seen without complicating factors. Pancreas: Unremarkable. No pancreatic ductal dilatation or surrounding inflammatory changes. Spleen: Central decreased attenuation is noted likely related to the arterial timing of the contrast bolus. No definitive mass is seen. Adrenals/Urinary Tract: Adrenal glands are within normal limits. Kidneys demonstrate a normal enhancement pattern bilaterally. No obstructive changes are seen. Bladder is within normal limits. Stomach/Bowel: Colon is predominately decompressed. No obstructive or inflammatory changes are identified. Changes consistent with prior appendectomy are noted. Small bowel shows no obstructive changes. The stomach is predominately decompressed. Lymphatic: No significant lymphadenopathy is noted. Reproductive: Multiple calcified uterine fibroids are noted posteriorly along the lower uterine segment. Poorly calcified fundal fibroid is noted as well. No adnexal mass is seen. Other: No abdominal wall hernia or abnormality. No abdominopelvic ascites. Musculoskeletal: Degenerative change of the lumbar spine is noted without acute abnormality. Review of the MIP images confirms the above findings. IMPRESSION: No evidence of aortic aneurysmal dilatation or dissection. No pulmonary emboli are seen. Chronic changes as described above without acute abnormality. Electronically Signed   By: Inez Catalina M.D.   On: 07/02/2019 09:34    Procedures Procedures (including critical care time)  Medications Ordered in ED Medications  ondansetron (ZOFRAN) injection 4 mg (4 mg Intravenous Not Given 07/02/19 1213)  morphine 2 MG/ML injection 2 mg (has no administration in time range)  lidocaine (LIDODERM) 5 % 1 patch (1 patch Transdermal Patch Applied 07/02/19 1019)  iohexol (OMNIPAQUE) 350 MG/ML injection 130 mL (130 mLs  Intravenous Contrast Given 07/02/19 0846)  tiZANidine (ZANAFLEX) tablet 4 mg (4 mg Oral Given 07/02/19 1019)    ED Course  I have reviewed the triage vital signs and the nursing notes.  Pertinent labs &  imaging results that were available during my care of the patient were reviewed by me and considered in my medical decision making (see chart for details).    MDM Rules/Calculators/A&P                         Patient here for evaluation of severe left sided neck pain. Patient distress on ED arrival with significant pain on lateral gaze. Given her severe distress initial concern for possible dissection. CTA was obtained, which is negative for dissection - it does note some incidental moderate vertebral stenosis. Troponin is negative times two. Patient's pain is partially improved after treatment in the department. No evidence of dissection, PE. Presentation is not consistent with ACS. Discussed with patient concern for torticollis. Discussed home care, outpatient follow-up and return precautions.  Final Clinical Impression(s) / ED Diagnoses Final diagnoses:  Torticollis    Rx / DC Orders ED Discharge Orders         Ordered    tiZANidine (ZANAFLEX) 4 MG tablet  Every 6 hours PRN     Discontinue  Reprint     07/02/19 1135    lidocaine (LIDODERM) 5 %  Every 24 hours     Discontinue  Reprint     07/02/19 1136           Quintella Reichert, MD 07/02/19 [phone]

## 2019-07-02 NOTE — Discharge Instructions (Addendum)
You had a CT scan performed in the emergency department today. The CT scan did show some narrowing in one of your vertebral arteries. Please follow-up with your family doctor for recheck.

## 2019-07-03 ENCOUNTER — Ambulatory Visit: Payer: Medicare Other | Attending: Cardiovascular Disease | Admitting: Cardiovascular Disease

## 2019-07-03 ENCOUNTER — Other Ambulatory Visit: Payer: Self-pay

## 2019-07-03 DIAGNOSIS — G4733 Obstructive sleep apnea (adult) (pediatric): Secondary | ICD-10-CM | POA: Insufficient documentation

## 2019-07-03 DIAGNOSIS — I493 Ventricular premature depolarization: Secondary | ICD-10-CM | POA: Diagnosis not present

## 2019-07-03 DIAGNOSIS — Z79899 Other long term (current) drug therapy: Secondary | ICD-10-CM | POA: Insufficient documentation

## 2019-07-03 DIAGNOSIS — Z7982 Long term (current) use of aspirin: Secondary | ICD-10-CM | POA: Insufficient documentation

## 2019-07-04 NOTE — Procedures (Signed)
[facility]        Patient Name: [name], [name] Date: 07/03/2019 Gender: Female D.O.B: 1941-01-02 Age (years): 83 Referring Provider: Shelva Majestic MD, ABSM Height (inches): 69 Interpreting Physician: Shelva Majestic MD, ABSM Weight (lbs): 157 RPSGT: Rosebud Poles BMI: 23 MRN: [id number] Neck Size: 15.50  CLINICAL INFORMATION Sleep Study Type: Split Night CPAP  Indication for sleep study: N/A  Epworth Sleepiness Score: 8  SLEEP STUDY TECHNIQUE As per the AASM Manual for the Scoring of Sleep and Associated Events v2.3 (April 2016) with a hypopnea requiring 4% desaturations.  The channels recorded and monitored were frontal, central and occipital EEG, electrooculogram (EOG), submentalis EMG (chin), nasal and oral airflow, thoracic and abdominal wall motion, anterior tibialis EMG, snore microphone, electrocardiogram, and pulse oximetry. Continuous positive airway pressure (CPAP) was initiated when the patient met split night criteria and was titrated according to treat sleep-disordered breathing.  MEDICATIONS aspirin 81 MG tablet busPIRone (BUSPAR) 30 MG tablet Calcium Carbonate-Vitamin D (CALCIUM 500/D) 500-125 MG-UNIT TABS Cyanocobalamin (VITAMIN B 12 PO) dimenhyDRINATE (DRAMAMINE) 50 MG tablet donepezil (ARICEPT) 10 MG tablet DULoxetine (CYMBALTA) 20 MG capsule ezetimibe (ZETIA) 10 MG tablet gabapentin (NEURONTIN) 300 MG capsule ibuprofen (ADVIL,MOTRIN) 200 MG tablet levothyroxine (SYNTHROID) 112 MCG tablet lidocaine (LIDODERM) 5 % lisinopril-hydrochlorothiazide (ZESTORETIC) 10-12.5 MG tablet Multiple Vitamin (MULTIVITAMIN) capsule Omega-3 Fatty Acids (FISH OIL) 1000 MG CAPS pantoprazole (PROTONIX) 40 MG tablet tiZANidine (ZANAFLEX) 4 MG tablet valACYclovir (VALTREX) 1000 MG tablet  Medications self-administered by patient taken the night of the study : N/A  RESPIRATORY PARAMETERS Diagnostic Total AHI (/hr): 49.5 RDI (/hr): 49.5 OA Index (/hr): - CA  Index (/hr): 0.0 REM AHI (/hr): N/A NREM AHI (/hr): 49.5 Supine AHI (/hr): 49.5 Non-supine AHI (/hr): 0 Min O2 Sat (%): 81.0 Mean O2 (%): 90.0 Time below 88% (min): 39.6   Titration Optimal Pressure (cm): 10 AHI at Optimal Pressure (/hr): 0.0 Min O2 at Optimal Pressure (%): 91.0 Supine % at Optimal (%): 100 Sleep % at Optimal (%): 100   SLEEP ARCHITECTURE The recording time for the entire night was 395.5 minutes.  During a baseline period of 179.4 minutes, the patient slept for 174.5 minutes in REM and nonREM, yielding a sleep efficiency of 97.3%%. Sleep onset after lights out was 0.2 minutes with a REM latency of N/A minutes. The patient spent 2.6%% of the night in stage N1 sleep, 84.5%% in stage N2 sleep, 12.9%% in stage N3 and 0% in REM.  During the titration period of 205.6 minutes, the patient slept for 195.3 minutes in REM and nonREM, yielding a sleep efficiency of 95.0%%. Sleep onset after CPAP initiation was 9.4 minutes with a REM latency of 85.0 minutes. The patient spent 2.6%% of the night in stage N1 sleep, 32.4%% in stage N2 sleep, 40.7%% in stage N3 and 24.3% in REM.  CARDIAC DATA The 2 lead EKG demonstrated sinus rhythm. The mean heart rate was 100.0 beats per minute. Other EKG findings include: PVCs.  LEG MOVEMENT DATA The total Periodic Limb Movements of Sleep (PLMS) were 4. The PLMS index was 0.6 .  IMPRESSIONS - Severe obstructive sleep apnea occurred during the diagnostic portion of the study (AHI 49.5/hour). CPAP was titrate to optimal PAP pressure at 10 cm of water. - No significant central sleep apnea occurred during the diagnostic portion of the study (CAI = 0.0/hour). - Severe oxygen desaturation was noted during the diagnostic portion of the study to a nadir of 81% during NREM sleep and 72% during  REM sleep. - The patient snored with moderate snoring volume during the diagnostic portion of the study. - EKG findings include PVCs. - Clinically significant periodic  limb movements did not occur during sleep.  DIAGNOSIS - Obstructive Sleep Apnea (327.23 [G47.33 ICD-10])  RECOMMENDATIONS - Recommend and initial trial of CPAP therapy with EPR at10 cm H2O with a Medium size Resmed Full Face Mask AirFit F20 mask and heated humidification. - Effort should be made to optimize nasal and oropgaryngeal patency. - Avoid alcohol, sedatives and other CNS depressants that may worsen sleep apnea and disrupt normal sleep architecture. - Sleep hygiene should be reviewed to assess factors that may improve sleep quality. - Weight management and regular exercise should be initiated or continued. - Recommend a downloac in 30days and sleep clinic evaluation after 4 weeks of therapy  [Electronically signed] 07/04/2019 05:39 PM  Shelva Majestic MD, [facility], ABSM Diplomate, American Board of Sleep Medicine   NPI: [id number] [place] PH: [phone]   FX: [phone] [place]

## 2019-07-17 ENCOUNTER — Telehealth: Payer: Self-pay | Admitting: *Deleted

## 2019-07-17 NOTE — Telephone Encounter (Signed)
Mychart message sent to patient informing her of sleep study results and recommendations.

## 2019-07-17 NOTE — Telephone Encounter (Signed)
Order for CPAP Airsense 10 sent to [employer].

## 2019-08-01 DIAGNOSIS — H35372 Puckering of macula, left eye: Secondary | ICD-10-CM | POA: Diagnosis not present

## 2019-08-01 DIAGNOSIS — H26492 Other secondary cataract, left eye: Secondary | ICD-10-CM | POA: Diagnosis not present

## 2019-08-01 DIAGNOSIS — Z961 Presence of intraocular lens: Secondary | ICD-10-CM | POA: Diagnosis not present

## 2019-08-01 DIAGNOSIS — H43813 Vitreous degeneration, bilateral: Secondary | ICD-10-CM | POA: Diagnosis not present

## 2019-08-01 DIAGNOSIS — H5711 Ocular pain, right eye: Secondary | ICD-10-CM | POA: Diagnosis not present

## 2019-08-13 ENCOUNTER — Other Ambulatory Visit: Payer: Self-pay

## 2019-08-13 ENCOUNTER — Ambulatory Visit: Payer: Medicare Other | Admitting: Cardiovascular Disease

## 2019-08-13 VITALS — BP 130/71 | HR 62 | Temp 94.1°F | Ht 69.5 in | Wt 161.6 lb

## 2019-08-13 DIAGNOSIS — G4733 Obstructive sleep apnea (adult) (pediatric): Secondary | ICD-10-CM | POA: Diagnosis not present

## 2019-08-13 DIAGNOSIS — R0683 Snoring: Secondary | ICD-10-CM | POA: Diagnosis not present

## 2019-08-13 DIAGNOSIS — E7800 Pure hypercholesterolemia, unspecified: Secondary | ICD-10-CM

## 2019-08-13 DIAGNOSIS — I10 Essential (primary) hypertension: Secondary | ICD-10-CM

## 2019-08-13 NOTE — Patient Instructions (Addendum)
Medication Instructions:  CONTINUE WITH CURRENT MEDICATIONS. NO CHANGES.  *If you need a refill on your cardiac medications before your next appointment, please call your pharmacy*  Follow-Up: At [facility] you and your health needs are our priority.  As part of our continuing mission to provide you with exceptional heart care, we have created designated Provider Care Teams.  These Care Teams include your primary Cardiologist (physician) and Advanced Practice Providers (APPs -  Physician Assistants and Nurse Practitioners) who all work together to provide you with the care you need, when you need it.  We recommend signing up for the patient portal called "MyChart".  Sign up information is provided on this After Visit Summary.  MyChart is used to connect with patients for Virtual Visits (Telemedicine).  Patients are able to view lab/test results, encounter notes, upcoming appointments, etc.  Non-urgent messages can be sent to your provider as well.   To learn more about what you can do with MyChart, go to [email or web address].    Your next appointment:   2-3 month(s)  The format for your next appointment:   In Person  Provider:   Shelva Majestic, MD   Address: [address], [place], [place] [address]  Phone: [phone]

## 2019-08-13 NOTE — Progress Notes (Signed)
Cardiology Office Note    Date:  08/13/2019   ID:  [name], DOB 07/12/1940, MRN [id number]  PCP:  Dettinger, Fransisca Kaufmann, MD  Cardiologist:  Shelva Majestic, MD (Sleep); Dr. Percival Spanish  F/U sleep evaluation referred by Dr. Percival Spanish.  History of Present Illness:  [name] is a 79 y.o. female who is followed by Dr. Percival Spanish.  She has a history of hypertension, hyperlipidemia, hypothyroidism, as well as GERD.  She has remote history of atypical chest pain and had a negative perfusion study in 2017.  In August 2019 she experienced presyncope.  She developed Covid the first week in January and it took her 3 to 4 weeks to feel better.  She was  evaluated by Dr. Percival Spanish on February 28, 2019 and complained of significant fatigability.  She had a remote history of diagnosed sleep apnea by sleep study approximately  8-10 years ago.  Apparently she was on CPAP for 4 to 5 years but has been off CPAP therapy for at least 4 years.  I saw her for initial sleep evaluation on May 09, 2019.  At that time, she admitted to significant fatigability and very poor sleep.  She admits to excessive daytime sleepiness.  She typically goes to bed at 10 PM and wakes up between 5 and 6 AM.  She experiences nocturia at least 3-4 times per night.  Her sleep is nonrestorative.  She is aware of snoring.  An Epworth Sleepiness Scale score was calculated in the office  and this endorsed at 13 as shown below:  Epworth Sleepiness Scale: Situation   Chance of Dozing/Sleeping (0 = never , 1 = slight chance , 2 = moderate chance , 3 = high chance )   sitting and reading 2   watching TV 2   sitting inactive in a public place 2   being a passenger in a motor vehicle for an hour or more 2   lying down in the afternoon 1   sitting and talking to someone 1   sitting quietly after lunch (no alcohol) 2   while stopped for a few minutes in traffic as the driver 1   Total Score  13   She was unaware of any bruxism, hypnagogic  hallucinations, or cataplectic events.  I referred Ms. Dicostanzo for a sleep study which was done at the [place] lab on July 03, 2019.  She met split-night protocol.  She was found to have severe obstructive sleep apnea during the diagnostic portion of the study with an AHI of 49.5/h.    CPAP was initiated was titrated up to 10 cm water pressure.  There was severe desaturation on the diagnostic portion of the study to a nadir of 81% and during REM sleep oxygen nadir was 72%.  Patient states that she has not yet initiated CPAP therapy since she had not heard from her DME company.  She has had continued snoring, nocturia least 3-4 times per night.  Typically she has been going to bed between 10 PM and midnight and wakes up between 6 and 7 AM.  She presents for evaluation.   Past Medical History:  Diagnosis Date  . Anal or rectal pain   . Anxiety state, unspecified   . Arthritis   . Arthropathy, unspecified, site unspecified   . Carpal tunnel syndrome    Bilateral  . Cataract   . Chronic airway obstruction, not elsewhere classified    patient denies on 02/27/14  . Esophageal reflux   .  Esophageal stricture   . Hypertension   . Internal hemorrhoids without mention of complication   . Malignant neoplasm of breast (female), unspecified site    left breast   . Memory disorder 09/24/2013  . Nocturnal leg cramps 06/29/2018  . Osteopenia   . Other and unspecified hyperlipidemia   . Pain in joint, multiple sites   . Personal history of colonic polyps    adenomatous  . Polyneuropathy in other diseases classified elsewhere ([place]) 09/24/2013  . Shingles   . Sleep apnea    no longer uses cpap  . Tubular adenoma of colon   . Unspecified hypothyroidism     Past Surgical History:  Procedure Laterality Date  . APPENDECTOMY  03/2014  . BACK SURGERY     X2  . BREAST LUMPECTOMY    . COLONOSCOPY  08/03/2018  . EYE SURGERY     cataracts bilateral  . LAPAROSCOPIC APPENDECTOMY N/A 02/28/2014    Procedure: APPENDECTOMY LAPAROSCOPIC PARTIAL CECECTOMY FOR MUCOCELE OF APPENDIX ;  Surgeon: Michael Boston, MD;  Location: [facility];  Service: General;  Laterality: N/A;  . MASTECTOMY     left  . THYROIDECTOMY    . TUBAL LIGATION      Current Medications: Outpatient Medications Prior to Visit  Medication Sig Dispense Refill  . aspirin 81 MG tablet Take 81 mg by mouth daily.     . busPIRone (BUSPAR) 30 MG tablet TAKE (1/2) TO (1) TABLET BY MOUTH ONCE DAILY AS NEEDED (Patient taking differently: Take 15-30 mg by mouth daily as needed (anxiety). ) 30 tablet 0  . Calcium Carbonate-Vitamin D (CALCIUM 500/D) 500-125 MG-UNIT TABS Take 1 capsule by mouth daily.      . Cyanocobalamin (VITAMIN B 12 PO) Take 1 tablet by mouth daily.     Marland Kitchen dimenhyDRINATE (DRAMAMINE) 50 MG tablet Take 50 mg by mouth daily.     Marland Kitchen donepezil (ARICEPT) 10 MG tablet Take 1 tablet (10 mg total) by mouth at bedtime. 90 tablet 3  . DULoxetine (CYMBALTA) 20 MG capsule Take 1 twice a day (Patient taking differently: Take 20 mg by mouth daily. ) 180 capsule 3  . ezetimibe (ZETIA) 10 MG tablet TAKE 1 TABLET BY MOUTH ONCE DAILY. (Patient taking differently: Take 10 mg by mouth daily. TAKE 1 TABLET BY MOUTH ONCE DAILY.) 90 tablet 0  . gabapentin (NEURONTIN) 300 MG capsule TAKE 1 CAPSULE EACH MORNING AND 2 AT BEDTIME. (Patient taking differently: Take 300-600 mg by mouth See admin instructions. TAKE 1 CAPSULE (314m) EACH MORNING AND 2 (6017m AT BEDTIME.) 270 capsule 3  . ibuprofen (ADVIL,MOTRIN) 200 MG tablet Take 800 mg by mouth every 6 (six) hours as needed (Pain).     . Marland Kitchenevothyroxine (SYNTHROID) 112 MCG tablet TAKE ONE TABLET BY MOUTH EACH DAY BEFORE BREAKFAST (Patient taking differently: Take 112 mcg by mouth daily before breakfast. ) 90 tablet 2  . lidocaine (LIDODERM) 5 % Place 1 patch onto the skin daily. Remove & Discard patch within 12 hours or as directed by MD 6 patch 0  . lisinopril-hydrochlorothiazide (ZESTORETIC) 10-12.5 MG  tablet TAKE 1 TABLET BY MOUTH ONCE DAILY. (Patient taking differently: Take 1 tablet by mouth daily. ) 90 tablet 0  . Multiple Vitamin (MULTIVITAMIN) capsule Take 1 capsule by mouth daily.      . Omega-3 Fatty Acids (FISH OIL) 1000 MG CAPS Take 1,000 mg by mouth daily.     . pantoprazole (PROTONIX) 40 MG tablet Take 1 tablet (40 mg total) by  mouth daily. 30 tablet 0  . tiZANidine (ZANAFLEX) 4 MG tablet Take 1 tablet (4 mg total) by mouth every 6 (six) hours as needed for muscle spasms. 15 tablet 0  . valACYclovir (VALTREX) 1000 MG tablet Take 1 tablet (1,000 mg total) by mouth 2 (two) times daily. 20 tablet 0   No facility-administered medications prior to visit.     Allergies:   Atorvastatin, Meperidine hcl, Statins, Norco [hydrocodone-acetaminophen], and Sulfonamide derivatives   Social History   Socioeconomic History  . Marital status: Divorced    Spouse name: Not on file  . Number of children: 2  . Years of education: Not on file  . Highest education level: Not on file  Occupational History  . Occupation: Retired    Fish farm manager: RETIRED  Tobacco Use  . Smoking status: Never Smoker  . Smokeless tobacco: Never Used  Vaping Use  . Vaping Use: Never used  Substance and Sexual Activity  . Alcohol use: No    Alcohol/week: 0.0 standard drinks  . Drug use: No  . Sexual activity: Never    Birth control/protection: Post-menopausal  Other Topics Concern  . Not on file  Social History Narrative   Patient lives at home alone and she is divorced.   Retired.   Education business course.   Right handed.   Caffeine sometimes tea.    Social Determinants of Health   Financial Resource Strain:   . Difficulty of Paying Living Expenses:   Food Insecurity:   . Worried About Charity fundraiser in the Last Year:   . Arboriculturist in the Last Year:   Transportation Needs:   . Film/video editor (Medical):   Marland Kitchen Lack of Transportation (Non-Medical):   Physical Activity:   . Days of  Exercise per Week:   . Minutes of Exercise per Session:   Stress:   . Feeling of Stress :   Social Connections:   . Frequency of Communication with Friends and Family:   . Frequency of Social Gatherings with Friends and Family:   . Attends Religious Services:   . Active Member of Clubs or Organizations:   . Attends Archivist Meetings:   Marland Kitchen Marital Status:     She is divorced.  She has 2 children ages 72 and 48 and 2 grandchildren.  Family History:  The patient's family history includes Bipolar disorder in her sister; Early death in her mother and sister.  Her mother died at age 49 in a motor vehicle accident.  Father died at age 38.  64 sister died at age 39.   ROS General: Negative; No fevers, chills, or night sweats;  HEENT: Negative; No changes in vision or hearing, sinus congestion, difficulty swallowing Pulmonary: Negative; No cough, wheezing, shortness of breath, hemoptysis Cardiovascular: Remote history of atypical chest pain and presyncope; positive for hypertension and hyperlipidemia GI: Positive for GERD GU: Negative; No dysuria, hematuria, or difficulty voiding Musculoskeletal: Negative; no myalgias, joint pain, or weakness Hematologic/Oncology: Negative; no easy bruising, bleeding Endocrine: Positive for hypothyroidism Neuro: Negative; no changes in balance, headaches Skin: Negative; No rashes or skin lesions Psychiatric: Negative; No behavioral problems, depression Sleep: See HPI: Other comprehensive 14 point system review is negative.   PHYSICAL EXAM:   VS:  BP 130/71   Pulse 62   Temp (!) 94.1 F (34.5 C)   Ht 5' 9.5" (1.765 m)   Wt 161 lb 9.6 oz (73.3 kg)   LMP  (LMP Unknown)  SpO2 98%   BMI 23.52 kg/m     Repeat blood pressure by me was 128/68  Wt Readings from Last 3 Encounters:  08/13/19 161 lb 9.6 oz (73.3 kg)  07/02/19 160 lb (72.6 kg)  05/14/19 156 lb (70.8 kg)      Physical Exam BP 130/71   Pulse 62   Temp (!) 94.1 F (34.5  C)   Ht 5' 9.5" (1.765 m)   Wt 161 lb 9.6 oz (73.3 kg)   LMP  (LMP Unknown)   SpO2 98%   BMI 23.52 kg/m  General: Alert, oriented, no distress.  Skin: normal turgor, no rashes, warm and dry HEENT: Normocephalic, atraumatic. Pupils equal round and reactive to light; sclera anicteric; extraocular muscles intact;  Nose without nasal septal hypertrophy Mouth/Parynx benign; Mallinpatti scale 3/4 Neck: No JVD, no carotid bruits; normal carotid upstroke Lungs: clear to ausculatation and percussion; no wheezing or rales Chest wall: without tenderness to palpitation Heart: PMI not displaced, RRR, s1 s2 normal, 1/6 systolic murmur, no diastolic murmur, no rubs, gallops, thrills, or heaves Abdomen: soft, nontender; no hepatosplenomehaly, BS+; abdominal aorta nontender and not dilated by palpation. Back: no CVA tenderness Pulses 2+ Musculoskeletal: full range of motion, normal strength, no joint deformities Extremities: no clubbing cyanosis or edema, Homan's sign negative  Neurologic: grossly nonfocal; Cranial nerves grossly wnl Psychologic: Normal mood and affect    Studies/Labs Reviewed:   EKG:  EKG is not ordered today.  I personally reviewed her last ECG from 02/28/2019 which reveals normal sinus rhythm at 66.  No ST segment changes.  No ectopy.  QTc interval 457 ms.  Recent Labs: BMP Latest Ref Rng & Units 07/02/2019 07/02/2019 03/28/2019  Glucose 70 - 99 mg/dL 99 99 94  BUN 8 - 23 mg/dL _0 Creatinine 0.44 - 1.00 mg/dL 0.50 0.65 0.57  BUN/Creat Ratio 12 - 28 - - 32(H)  Sodium 135 - 145 mmol/L 142 140 140  Potassium 3.5 - 5.1 mmol/L 3.6 3.7 4.3  Chloride 98 - 111 mmol/L 106 107 102  CO2 22 - 32 mmol/L - 21(L) 24  Calcium 8.9 - 10.3 mg/dL - 8.7(L) 9.6     Hepatic Function Latest Ref Rng & Units 07/02/2019 03/28/2019 06/09/2018  Total Protein 6.5 - 8.1 g/dL 6.0(L) 6.2 6.5  Albumin 3.5 - 5.0 g/dL 3.6 4.3 4.5  AST 15 - 41 U/L _1 ALT 0 - 44 U/L _2 Alk Phosphatase 38  - 126 U/L 42 53 46  Total Bilirubin 0.3 - 1.2 mg/dL 0.8 0.6 0.8  Bilirubin, Direct 0.00 - 0.40 mg/dL - - -    CBC Latest Ref Rng & Units 07/02/2019 07/02/2019 03/28/2019  WBC 4.0 - 10.5 K/uL - 7.6 6.1  Hemoglobin 12.0 - 15.0 g/dL 12.6 12.4 12.6  Hematocrit 36 - 46 % 37.0 38.2 38.1  Platelets 150 - 400 K/uL - 205 223   Lab Results  Component Value Date   MCV 94.3 07/02/2019   MCV 94 03/28/2019   MCV 94 06/09/2018   Lab Results  Component Value Date   TSH 2.130 03/28/2019   Lab Results  Component Value Date   HGBA1C 5.2 09/28/2013     BNP No results found for: BNP  ProBNP No results found for: PROBNP   Lipid Panel     Component Value Date/Time   CHOL 197 03/28/2019 1507   CHOL 228 (H) 05/17/2012 1134   TRIG 98 03/28/2019 1507  TRIG 92 10/01/2015 1129   TRIG 88 05/17/2012 1134   HDL 48 03/28/2019 1507   HDL 61 10/01/2015 1129   HDL 55 05/17/2012 1134   CHOLHDL 4.1 03/28/2019 1507   LDLCALC 131 (H) 03/28/2019 1507   LDLCALC 161 (H) 09/28/2013 0823   LDLCALC 155 (H) 05/17/2012 1134   LDLDIRECT 164 (H) 06/07/2014 0858   LABVLDL 18 03/28/2019 1507    RADIOLOGY: No results found.   Additional studies/ records that were reviewed today include:  I have reviewed the records of Dr. Percival Spanish, her ER evaluation and prior neurology evaluation.  ASSESSMENT:    No diagnosis found.   PLAN:  Ms. Tyrihanna Wingert is a 79 year old female who has a history of hypertension, hyperlipidemia, hypothyroidism, GERD, and remotely had experienced an episode of presyncope.  She also has history of peripheral neuropathy followed by neurology.  She has a history of previously documented obstructive sleep apnea and she believes she may have had a sleep study done approximately 8 to 10 years ago.  She apparently was on CPAP for approximately 45 years and then had not been on treatment for at least another for 5 years.  I reviewed her most recent sleep study with her which was done at the  [facility] sleep lab on July 03, 2019.  This confirms severe sleep apnea with an overall AHI at 49.5/h.  She had significant oxygen desaturation.  She was titrated up to 10 cm water pressure.  Apparently, the patient states that she has not yet been contacted.  We had contacted our sleep coordinator who had attempted to contact the patient.  In addition, [facility] had also attempted to contact the patient to provide her with her machine.  There is some reversal as to why this has not yet been implemented but there is also was some issue about possible [employer] as a DME..  We will try to arrange for her CPAP to be initiated as soon as possible.  I discussed with her the implications of her untreated sleep apnea with reference to her cardiovascular health the importance of resumption of treatment.  Her blood pressure today is stable on her current regimen consisting of lisinopril HCT 10/12.5 mg.  She is on levothyroxine 112 mcg for hypothyroidism.  She is tolerating Zetia for hyperlipidemia and takes Protonix for GERD.  See her prior to her 90-day window is complete for CPAP compliance following reinstitution of therapy.   Medication Adjustments/Labs and Tests Ordered: Current medicines are reviewed at length with the patient today.  Concerns regarding medicines are outlined above.  Medication changes, Labs and Tests ordered today are listed in the Patient Instructions below. There are no Patient Instructions on file for this visit.   Signed, Shelva Majestic, MD  08/13/2019 11:24 AM    [place] [address], [place], [place], [place]  [address] Phone: [phone]

## 2019-08-14 ENCOUNTER — Encounter: Payer: Self-pay | Admitting: Cardiovascular Disease

## 2019-08-21 ENCOUNTER — Other Ambulatory Visit: Payer: Self-pay | Admitting: Family Medicine

## 2019-08-21 ENCOUNTER — Telehealth: Payer: Self-pay | Admitting: Neurology

## 2019-08-21 MED ORDER — DICLOFENAC SODIUM 3 % EX GEL: 1.0000 g | Freq: Two times a day (BID) | CUTANEOUS | 3 refills | Status: DC | PRN

## 2019-08-21 NOTE — Telephone Encounter (Signed)
The patient is having some burning associated with her neuropathy in the evening hours, she wants to try topical diclofenac, I have no problem with this, I will send in a prescription.

## 2019-08-21 NOTE — Telephone Encounter (Signed)
Pt called wanting to know if Diclofenac can be called in for her to help with the burning sensation she is experiencing due to her Trigeminal Neuralgia. Please advise.

## 2019-09-11 ENCOUNTER — Other Ambulatory Visit: Payer: Self-pay

## 2019-09-11 ENCOUNTER — Ambulatory Visit: Payer: Medicare Other | Admitting: Neurology

## 2019-09-11 ENCOUNTER — Encounter: Payer: Self-pay | Admitting: Neurology

## 2019-09-11 VITALS — BP 150/80 | HR 58 | Ht 69.0 in | Wt 163.0 lb

## 2019-09-11 DIAGNOSIS — G63 Polyneuropathy in diseases classified elsewhere: Secondary | ICD-10-CM | POA: Diagnosis not present

## 2019-09-11 DIAGNOSIS — G500 Trigeminal neuralgia: Secondary | ICD-10-CM | POA: Diagnosis not present

## 2019-09-11 DIAGNOSIS — G3184 Mild cognitive impairment, so stated: Secondary | ICD-10-CM | POA: Diagnosis not present

## 2019-09-11 MED ORDER — DULOXETINE HCL 20 MG PO CPEP: ORAL_CAPSULE | ORAL | 3 refills | Status: DC

## 2019-09-11 MED ORDER — GABAPENTIN 300 MG PO CAPS: ORAL_CAPSULE | ORAL | 3 refills | Status: DC

## 2019-09-11 MED ORDER — DONEPEZIL HCL 10 MG PO TABS: 10.0000 mg | ORAL_TABLET | Freq: Every day | ORAL | 3 refills | Status: DC

## 2019-09-11 NOTE — Progress Notes (Signed)
PATIENT: [name] DOB: 02-03-1940  REASON FOR VISIT: follow up HISTORY FROM: patient  HISTORY OF PRESENT ILLNESS: Today 09/11/19 [name] is a 79 year old female with history of mild cognitive impairment, trigeminal neuralgia, and peripheral neuropathy.  She has complained of right frontal and retro-orbital headaches.  She is on gabapentin and Cymbalta.  Diclofenac gel has been added for TN.   Is overall stable, will be starting CPAP soon, continues with occasional jabs of headache to the right frontal area, has neuropathy pain in the legs, cramps in her feet and hands (consuming mustard without benefit.  Suffered a fall few months ago, fell in the bathtub. CT head was negative for acute process.  She lives alone, drives a car.  Does not want any more medication, due to side effect of drowsiness.  Memory is stable, 30/30 today.  Presents today for evaluation unaccompanied.  HISTORY 01/10/2019 SS: [name] is a 79 year old female with history of mild cognitive impairment, trigeminal neuralgia, and peripheral neuropathy.  She also complains of right frontal and retro-orbital headaches.  She remains on gabapentin, at last visit she was started on Cymbalta.  She reports that her memory has stayed the same.  She remains on Aricept.  She reports that her headaches are under good control with gabapentin.  She continues to complain of burning and stinging in her legs, up to her knees, but she thinks the Cymbalta has been beneficial.  She does continue to have occasional nocturnal leg cramps, but she has found that eating mustard has been helpful.  [place], but has family nearby.  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  She has not had any falls.  She reports her daughter is planning to take over management of her medications, because there are times she forgets if she has had taken something.  She continues to drive a car  without difficulty.  She presents today for evaluation unaccompanied.   REVIEW OF SYSTEMS: Out of a complete 14 system review of symptoms, the patient complains only of the following symptoms, and all other reviewed systems are negative.  See HPI  ALLERGIES: Allergies  Allergen Reactions  . Atorvastatin Other (See Comments)    Muscle cramps  . Meperidine Hcl Nausea And Vomiting    Makes me 'deathly' sick  . Statins Other (See Comments)    REACTION: Patient cannot tolerate due cramps  . Norco [Hydrocodone-Acetaminophen] Nausea And Vomiting  . Sulfonamide Derivatives Other (See Comments)    Unknown reaction    HOME MEDICATIONS: Outpatient Medications Prior to Visit  Medication Sig Dispense Refill  . aspirin 81 MG tablet Take 81 mg by mouth daily.     . busPIRone (BUSPAR) 30 MG tablet TAKE (1/2) TO (1) TABLET BY MOUTH ONCE DAILY AS NEEDED (Patient taking differently: Take 15-30 mg by mouth daily as needed (anxiety). ) 30 tablet 0  . Calcium Carbonate-Vitamin D (CALCIUM 500/D) 500-125 MG-UNIT TABS Take 1 capsule by mouth daily.      . Cyanocobalamin (VITAMIN B 12 PO) Take 1 tablet by mouth daily.     . Diclofenac Sodium 3 % GEL Apply 1 g topically 2 (two) times daily as needed. 60 g 3  . dimenhyDRINATE (DRAMAMINE) 50 MG tablet Take 50 mg by mouth daily.     Marland Kitchen donepezil (ARICEPT) 10 MG tablet Take 1 tablet (10 mg total) by mouth at bedtime. [place]  tablet 3  . DULoxetine (CYMBALTA) 20 MG capsule Take 1 twice a day (Patient taking differently: Take 20 mg by mouth daily. ) 180 capsule 3  . ezetimibe (ZETIA) 10 MG tablet TAKE 1 TABLET BY MOUTH ONCE DAILY. 90 tablet 0  . gabapentin (NEURONTIN) 300 MG capsule TAKE 1 CAPSULE EACH MORNING AND 2 AT BEDTIME. (Patient taking differently: Take 300-600 mg by mouth See admin instructions. TAKE 1 CAPSULE (300mg ) EACH MORNING AND 2 (600mg ) AT BEDTIME.) 270 capsule 3  . ibuprofen (ADVIL,MOTRIN) 200 MG tablet Take 800 mg by mouth every 6 (six) hours as needed  (Pain).     Marland Kitchen levothyroxine (SYNTHROID) 112 MCG tablet TAKE ONE TABLET BY MOUTH EACH DAY BEFORE BREAKFAST (Patient taking differently: Take 112 mcg by mouth daily before breakfast. ) 90 tablet 2  . lidocaine (LIDODERM) 5 % Place 1 patch onto the skin daily. Remove & Discard patch within 12 hours or as directed by MD 6 patch 0  . lisinopril-hydrochlorothiazide (ZESTORETIC) 10-12.5 MG tablet TAKE 1 TABLET BY MOUTH ONCE DAILY. 90 tablet 0  . Multiple Vitamin (MULTIVITAMIN) capsule Take 1 capsule by mouth daily.      . Omega-3 Fatty Acids (FISH OIL) 1000 MG CAPS Take 1,000 mg by mouth daily.     . pantoprazole (PROTONIX) 40 MG tablet Take 1 tablet (40 mg total) by mouth daily. 30 tablet 0  . tiZANidine (ZANAFLEX) 4 MG tablet Take 1 tablet (4 mg total) by mouth every 6 (six) hours as needed for muscle spasms. 15 tablet 0  . valACYclovir (VALTREX) 1000 MG tablet Take 1 tablet (1,000 mg total) by mouth 2 (two) times daily. 20 tablet 0   No facility-administered medications prior to visit.    PAST MEDICAL HISTORY: Past Medical History:  Diagnosis Date  . Anal or rectal pain   . Anxiety state, unspecified   . Arthritis   . Arthropathy, unspecified, site unspecified   . Carpal tunnel syndrome    Bilateral  . Cataract   . Chronic airway obstruction, not elsewhere classified    patient denies on 02/27/14  . Esophageal reflux   . Esophageal stricture   . Hypertension   . Internal hemorrhoids without mention of complication   . Malignant neoplasm of breast (female), unspecified site    left breast   . Memory disorder 09/24/2013  . Nocturnal leg cramps 06/29/2018  . Osteopenia   . Other and unspecified hyperlipidemia   . Pain in joint, multiple sites   . Personal history of colonic polyps    adenomatous  . Polyneuropathy in other diseases classified elsewhere ([place]) 09/24/2013  . Shingles   . Sleep apnea    no longer uses cpap  . Tubular adenoma of colon   . Unspecified hypothyroidism      PAST SURGICAL HISTORY: Past Surgical History:  Procedure Laterality Date  . APPENDECTOMY  03/2014  . BACK SURGERY     X2  . BREAST LUMPECTOMY    . COLONOSCOPY  08/03/2018  . EYE SURGERY     cataracts bilateral  . LAPAROSCOPIC APPENDECTOMY N/A 02/28/2014   Procedure: APPENDECTOMY LAPAROSCOPIC PARTIAL CECECTOMY FOR MUCOCELE OF APPENDIX ;  Surgeon: Michael Boston, MD;  Location: [facility];  Service: General;  Laterality: N/A;  . MASTECTOMY     left  . THYROIDECTOMY    . TUBAL LIGATION      FAMILY HISTORY: Family History  Problem Relation Age of Onset  . Bipolar disorder Sister   . Early death Mother   .  Early death Sister   . Colon cancer Neg Hx   . Esophageal cancer Neg Hx   . Rectal cancer Neg Hx   . Stomach cancer Neg Hx     SOCIAL HISTORY: Social History   Socioeconomic History  . Marital status: Divorced    Spouse name: Not on file  . Number of children: 2  . Years of education: Not on file  . Highest education level: Not on file  Occupational History  . Occupation: Retired    Fish farm manager: RETIRED  Tobacco Use  . Smoking status: Never Smoker  . Smokeless tobacco: Never Used  Vaping Use  . Vaping Use: Never used  Substance and Sexual Activity  . Alcohol use: No    Alcohol/week: 0.0 standard drinks  . Drug use: No  . Sexual activity: Never    Birth control/protection: Post-menopausal  Other Topics Concern  . Not on file  Social History Narrative   Patient lives at home alone and she is divorced.   Retired.   Education business course.   Right handed.   Caffeine sometimes tea.    Social Determinants of Health   Financial Resource Strain:   . Difficulty of Paying Living Expenses: Not on file  Food Insecurity:   . Worried About Charity fundraiser in the Last Year: Not on file  . Ran Out of Food in the Last Year: Not on file  Transportation Needs:   . Lack of Transportation (Medical): Not on file  . Lack of Transportation (Non-Medical): Not on file   Physical Activity:   . Days of Exercise per Week: Not on file  . Minutes of Exercise per Session: Not on file  Stress:   . Feeling of Stress : Not on file  Social Connections:   . Frequency of Communication with Friends and Family: Not on file  . Frequency of Social Gatherings with Friends and Family: Not on file  . Attends Religious Services: Not on file  . Active Member of Clubs or Organizations: Not on file  . Attends Archivist Meetings: Not on file  . Marital Status: Not on file  Intimate Partner Violence:   . Fear of Current or Ex-Partner: Not on file  . Emotionally Abused: Not on file  . Physically Abused: Not on file  . Sexually Abused: Not on file   PHYSICAL EXAM  Vitals:   09/11/19 1036  BP: (!) 150/80  Pulse: (!) 58  Weight: 163 lb (73.9 kg)  Height: 5\' 9"  (1.753 m)   Body mass index is 24.07 kg/m.  Generalized: Well developed, in no acute distress  MMSE - Mini Mental State Exam 09/11/2019 06/29/2018 10/14/2017  Orientation to time 5 4 5   Orientation to Place 5 5 5   Registration 3 3 3   Attention/ Calculation 5 5 5   Recall 3 3 3   Language- name 2 objects 2 2 2   Language- repeat 1 1 1   Language- follow 3 step command 3 3 3   Language- read & follow direction 1 1 1   Write a sentence 1 1 1   Copy design 1 1 1   Total score 30 29 30     Neurological examination  Mentation: Alert oriented to time, place, history taking. Follows all commands speech and language fluent Cranial nerve II-XII: Pupils were equal round reactive to light. Extraocular movements were full, visual field were full on confrontational test. Facial sensation and strength were normal. Head turning and shoulder shrug  were normal and symmetric. Motor: The  motor testing reveals 5 over 5 strength of all 4 extremities. Good symmetric motor tone is noted throughout.  Sensory: Sensory testing is intact to soft touch on all 4 extremities. No evidence of extinction is noted.  Coordination:  Cerebellar testing reveals good finger-nose-finger and heel-to-shin bilaterally.  Gait and station: Gait is normal, good pace, stride, can walk independently Reflexes: Deep tendon reflexes are symmetric and normal bilaterally.   DIAGNOSTIC DATA (LABS, IMAGING, TESTING) - I reviewed patient records, labs, notes, testing and imaging myself where available.  Lab Results  Component Value Date   WBC 7.6 07/02/2019   HGB 12.6 07/02/2019   HCT 37.0 07/02/2019   MCV 94.3 07/02/2019   PLT 205 07/02/2019      Component Value Date/Time   NA 142 07/02/2019 0823   NA 140 03/28/2019 1507   NA 144 04/23/2013 1549   K 3.6 07/02/2019 0823   K 3.5 04/23/2013 1549   CL 106 07/02/2019 0823   CO2 21 (L) 07/02/2019 0815   CO2 25 04/23/2013 1549   GLUCOSE 99 07/02/2019 0823   GLUCOSE 97 04/23/2013 1549   BUN 18 07/02/2019 0823   BUN 18 03/28/2019 1507   BUN 20.1 04/23/2013 1549   CREATININE 0.50 07/02/2019 0823   CREATININE 0.7 04/23/2013 1549   CALCIUM 8.7 (L) 07/02/2019 0815   CALCIUM 10.3 04/23/2013 1549   PROT 6.0 (L) 07/02/2019 0815   PROT 6.2 03/28/2019 1507   PROT 6.8 04/23/2013 1549   ALBUMIN 3.6 07/02/2019 0815   ALBUMIN 4.3 03/28/2019 1507   ALBUMIN 4.1 04/23/2013 1549   AST 30 07/02/2019 0815   AST 31 04/23/2013 1549   ALT 21 07/02/2019 0815   ALT 32 04/23/2013 1549   ALKPHOS 42 07/02/2019 0815   ALKPHOS 74 04/23/2013 1549   BILITOT 0.8 07/02/2019 0815   BILITOT 0.6 03/28/2019 1507   BILITOT 0.53 04/23/2013 1549   GFRNONAA >60 07/02/2019 0815   GFRNONAA >89 07/10/2012 1056   GFRAA >60 07/02/2019 0815   GFRAA >89 07/10/2012 1056   Lab Results  Component Value Date   CHOL 197 03/28/2019   HDL 48 03/28/2019   LDLCALC 131 (H) 03/28/2019   LDLDIRECT 164 (H) 06/07/2014   TRIG 98 03/28/2019   CHOLHDL 4.1 03/28/2019   Lab Results  Component Value Date   HGBA1C 5.2 09/28/2013   Lab Results  Component Value Date   VITAMINB12 1,831 (H) 06/09/2018   Lab Results    Component Value Date   TSH 2.130 03/28/2019      ASSESSMENT AND PLAN 79 y.o. year old female  has a past medical history of Anal or rectal pain, Anxiety state, unspecified, Arthritis, Arthropathy, unspecified, site unspecified, Carpal tunnel syndrome, Cataract, Chronic airway obstruction, not elsewhere classified, Esophageal reflux, Esophageal stricture, Hypertension, Internal hemorrhoids without mention of complication, Malignant neoplasm of breast (female), unspecified site, Memory disorder (09/24/2013), Nocturnal leg cramps (06/29/2018), Osteopenia, Other and unspecified hyperlipidemia, Pain in joint, multiple sites, Personal history of colonic polyps, Polyneuropathy in other diseases classified elsewhere ([place]) (09/24/2013), Shingles, Sleep apnea, Tubular adenoma of colon, and Unspecified hypothyroidism. here with:  1.  Mild cognitive impairment -MMSE 30/30 today -Continue Aricept 10 mg at bedtime  2.  Nocturnal leg cramps -Talked about adding baclofen, wants to hold off for now  3.  Trigeminal neuralgia, right 4.  Peripheral neuropathy -Overall stable, continues with related pain -Continue gabapentin 300 mg am, 600 mg pm -Continue Cymbalta 20 mg twice a day -Continue diclofenac gel as needed  for TN pain -Does not want to adjust any dosing currently due to worry about drowsiness, maybe this will improve with initiation of CPAP -Follow-up in 8 months or sooner if needed  I spent 30 minutes of face-to-face and non-face-to-face time with patient.  This included previsit chart review, lab review, study review, order entry, electronic health record documentation, patient education.  Butler Denmark, AGNP-C, DNP 09/11/2019, 10:45 AM [facility] [address], [place], [place] [address] [phone]

## 2019-09-11 NOTE — Patient Instructions (Signed)
Continue current medications  Please see your primary doctor about your leg  See you back in 8 months

## 2019-09-12 NOTE — Progress Notes (Signed)
I have read the note, and I agree with the clinical assessment and plan.  Devony Mcgrady K Itxel Wickard   

## 2019-10-30 ENCOUNTER — Ambulatory Visit (INDEPENDENT_AMBULATORY_CARE_PROVIDER_SITE_OTHER): Payer: Medicare Other

## 2019-10-30 ENCOUNTER — Other Ambulatory Visit: Payer: Self-pay

## 2019-10-30 DIAGNOSIS — Z23 Encounter for immunization: Secondary | ICD-10-CM | POA: Diagnosis not present

## 2019-11-14 ENCOUNTER — Other Ambulatory Visit: Payer: Self-pay | Admitting: Family Medicine

## 2019-12-03 ENCOUNTER — Ambulatory Visit: Payer: Medicare Other | Admitting: Cardiovascular Disease

## 2020-02-06 ENCOUNTER — Other Ambulatory Visit: Payer: Self-pay | Admitting: Family Medicine

## 2020-02-11 ENCOUNTER — Encounter: Payer: Self-pay | Admitting: Family

## 2020-02-11 ENCOUNTER — Ambulatory Visit (INDEPENDENT_AMBULATORY_CARE_PROVIDER_SITE_OTHER): Payer: Medicare Other | Admitting: Family

## 2020-02-11 DIAGNOSIS — J029 Acute pharyngitis, unspecified: Secondary | ICD-10-CM | POA: Diagnosis not present

## 2020-02-11 DIAGNOSIS — R509 Fever, unspecified: Secondary | ICD-10-CM

## 2020-02-11 MED ORDER — AMOXICILLIN-POT CLAVULANATE 875-125 MG PO TABS: 1.0000 | ORAL_TABLET | Freq: Two times a day (BID) | ORAL | 0 refills | Status: DC

## 2020-02-11 NOTE — Progress Notes (Signed)
Virtual Visit via telephone Note Due to COVID-19 pandemic this visit was conducted virtually. This visit type was conducted due to national recommendations for restrictions regarding the COVID-19 Pandemic (e.g. social distancing, sheltering in place) in an effort to limit this patient's exposure and mitigate transmission in our community. All issues noted in this document were discussed and addressed.  A physical exam was not performed with this format.  I connected with [name] on 02/11/20 at 8:55 AM  by telephone and verified that I am speaking with the correct person using two identifiers. [name] is currently located at home and no one is currently with her during visit. The provider, [name], FNP is located in their office at time of visit.  I discussed the limitations, risks, security and privacy concerns of performing an evaluation and management service by telephone and the availability of in person appointments. I also discussed with the patient that there may be a patient responsible charge related to this service. The patient expressed understanding and agreed to proceed.   History and Present Illness:  Sore Throat  This is a new problem. The current episode started in the past 7 days. The problem has been gradually worsening. The maximum temperature recorded prior to her arrival was 100.4 - 100.9 F. The pain is at a severity of 8/10. Associated symptoms include congestion, coughing, ear pain, headaches, a hoarse voice, swollen glands and trouble swallowing. Pertinent negatives include no shortness of breath. Associated symptoms comments: White patches in throat. She has had no exposure to strep. She has tried acetaminophen and NSAIDs for the symptoms. The treatment provided mild relief.      Review of Systems  HENT: Positive for congestion, ear pain, hoarse voice and trouble swallowing.   Respiratory: Positive for cough. Negative for shortness of breath.    Neurological: Positive for headaches.  All other systems reviewed and are negative.    Observations/Objective: No SOB or distress noted, hoarse voice   Assessment and Plan: 1. Acute pharyngitis, unspecified etiology - Take meds as prescribed - Use a cool mist humidifier  -Use saline nose sprays frequently -Force fluids -For any cough or congestion  Use plain Mucinex- regular strength or max strength is fine -For fever or aces or pains- take tylenol or ibuprofen. -Throat lozenges if help -New toothbrush in 3 days Call if symptoms worsen or do not improve, she will come today for COVID testing to rule out - Novel Coronavirus, NAA ([facility]) - amoxicillin-clavulanate (AUGMENTIN) 875-125 MG tablet; Take 1 tablet by mouth 2 (two) times daily.  Dispense: 14 tablet; Refill: 0  2. Fever, unspecified fever cause - Novel Coronavirus, NAA ([facility]) - amoxicillin-clavulanate (AUGMENTIN) 875-125 MG tablet; Take 1 tablet by mouth 2 (two) times daily.  Dispense: 14 tablet; Refill: 0    I discussed the assessment and treatment plan with the patient. The patient was provided an opportunity to ask questions and all were answered. The patient agreed with the plan and demonstrated an understanding of the instructions.   The patient was advised to call back or seek an in-person evaluation if the symptoms worsen or if the condition fails to improve as anticipated.  The above assessment and management plan was discussed with the patient. The patient verbalized understanding of and has agreed to the management plan. Patient is aware to call the clinic if symptoms persist or worsen. Patient is aware when to return to the clinic for a follow-up visit. Patient educated on when it is appropriate  to go to the emergency department.   Time call ended:  9:07 AM   I provided 12 minutes of non-face-to-face time during this encounter.    [name], FNP

## 2020-02-12 ENCOUNTER — Other Ambulatory Visit (HOSPITAL_COMMUNITY): Payer: Self-pay | Admitting: Family Medicine

## 2020-02-12 DIAGNOSIS — Z1231 Encounter for screening mammogram for malignant neoplasm of breast: Secondary | ICD-10-CM

## 2020-02-12 LAB — NOVEL CORONAVIRUS, NAA: SARS-CoV-2, NAA: NOT DETECTED

## 2020-02-12 LAB — SARS-COV-2, NAA 2 DAY TAT

## 2020-02-25 ENCOUNTER — Ambulatory Visit (INDEPENDENT_AMBULATORY_CARE_PROVIDER_SITE_OTHER): Payer: Medicare Other | Admitting: Family Medicine

## 2020-02-25 ENCOUNTER — Encounter: Payer: Self-pay | Admitting: Family Medicine

## 2020-02-25 VITALS — BP 143/63 | HR 63 | Ht 69.0 in

## 2020-02-25 DIAGNOSIS — J441 Chronic obstructive pulmonary disease with (acute) exacerbation: Secondary | ICD-10-CM | POA: Diagnosis not present

## 2020-02-25 MED ORDER — AMOXICILLIN-POT CLAVULANATE 875-125 MG PO TABS: 1.0000 | ORAL_TABLET | Freq: Two times a day (BID) | ORAL | 0 refills | Status: DC

## 2020-02-25 MED ORDER — PREDNISONE 20 MG PO TABS: ORAL_TABLET | ORAL | 0 refills | Status: DC

## 2020-02-25 MED ORDER — ALBUTEROL SULFATE HFA 108 (90 BASE) MCG/ACT IN AERS: 2.0000 | INHALATION_SPRAY | Freq: Four times a day (QID) | RESPIRATORY_TRACT | 0 refills | Status: DC | PRN

## 2020-02-25 NOTE — Progress Notes (Signed)
BP (!) 143/63   Pulse 63   Ht 5\' 9"  (1.753 m)   LMP  (LMP Unknown)   SpO2 100%   BMI 24.07 kg/m    Subjective:   Patient ID: [name], female    DOB: 1940-10-23, 80 y.o.   MRN: [id number]  HPI: [name] is a 80 y.o. female presenting on 02/25/2020 for URI (HA, coughing, congestion 2 weeks ago. ATB given at that time. HA lingered in to last week. Now some HA and SOB. Has some nausea and weakness. )   HPI Patient is coming in today for cough and congestion that started 3 weeks ago.  She was seen in Covid test and given an antibiotic and it did improve but did not fully resolve.  She still having some cough and congestion and headache and some shortness of breath and some wheezing and also describes some nausea and the occasional weakness.  She is using some Mucinex over-the-counter without significant improvement.  She denies any sick contacts that we know of.  Relevant past medical, surgical, family and social history reviewed and updated as indicated. Interim medical history since our last visit reviewed. Allergies and medications reviewed and updated.  Review of Systems  Constitutional: Negative for chills and fever.  HENT: Positive for congestion, postnasal drip, rhinorrhea, sinus pressure, sneezing and sore throat. Negative for ear discharge and ear pain.   Eyes: Negative for pain, redness and visual disturbance.  Respiratory: Positive for cough, shortness of breath and wheezing. Negative for chest tightness.   Cardiovascular: Negative for chest pain and leg swelling.  Genitourinary: Negative for difficulty urinating and dysuria.  Musculoskeletal: Negative for back pain and gait problem.  Skin: Negative for rash.  Neurological: Positive for headaches. Negative for light-headedness.  Psychiatric/Behavioral: Negative for agitation and behavioral problems.  All other systems reviewed and are negative.   Per HPI unless specifically indicated above   Allergies as of  02/25/2020      Reactions   Atorvastatin Other (See Comments)   Muscle cramps   Meperidine Hcl Nausea And Vomiting   Makes me 'deathly' sick   Statins Other (See Comments)   REACTION: Patient cannot tolerate due cramps   Norco [hydrocodone-acetaminophen] Nausea And Vomiting   Sulfonamide Derivatives Other (See Comments)   Unknown reaction      Medication List       Accurate as of February 25, 2020  5:05 PM. If you have any questions, ask your nurse or doctor.        STOP taking these medications   amoxicillin-clavulanate 875-125 MG tablet Commonly known as: AUGMENTIN Stopped by: Fransisca Kaufmann Mecca Barga, MD   DULoxetine 20 MG capsule Commonly known as: Cymbalta Stopped by: Fransisca Kaufmann Tamari Busic, MD   lidocaine 5 % Commonly known as: Lidoderm Stopped by: Fransisca Kaufmann Tora Prunty, MD     TAKE these medications   aspirin 81 MG tablet Take 81 mg by mouth daily.   busPIRone 30 MG tablet Commonly known as: BUSPAR TAKE (1/2) TO (1) TABLET BY MOUTH ONCE DAILY AS NEEDED What changed: See the new instructions.   Calcium Carbonate-Vitamin D 500-125 MG-UNIT Tabs Take 1 capsule by mouth daily.   Diclofenac Sodium 3 % Gel Apply 1 g topically 2 (two) times daily as needed.   dimenhyDRINATE 50 MG tablet Commonly known as: DRAMAMINE Take 50 mg by mouth daily.   donepezil 10 MG tablet Commonly known as: ARICEPT Take 1 tablet (10 mg total) by mouth at bedtime.   ezetimibe  10 MG tablet Commonly known as: ZETIA TAKE 1 TABLET BY MOUTH ONCE DAILY. (Needs to be seen before next refill)   Fish Oil 1000 MG Caps Take 1,000 mg by mouth daily.   gabapentin 300 MG capsule Commonly known as: NEURONTIN TAKE 1 CAPSULE EACH MORNING AND 2 AT BEDTIME.   ibuprofen 200 MG tablet Commonly known as: ADVIL Take 800 mg by mouth every 6 (six) hours as needed (Pain).   levothyroxine 112 MCG tablet Commonly known as: SYNTHROID Take 1 tablet (112 mcg total) by mouth daily before breakfast. (Needs to be  seen before next refill)   lisinopril-hydrochlorothiazide 10-12.5 MG tablet Commonly known as: ZESTORETIC Take 1 tablet by mouth daily. (Needs to be seen before next refill)   multivitamin capsule Take 1 capsule by mouth daily.   pantoprazole 40 MG tablet Commonly known as: PROTONIX Take 1 tablet (40 mg total) by mouth daily.   tiZANidine 4 MG tablet Commonly known as: Zanaflex Take 1 tablet (4 mg total) by mouth every 6 (six) hours as needed for muscle spasms.   valACYclovir 1000 MG tablet Commonly known as: VALTREX Take 1 tablet (1,000 mg total) by mouth 2 (two) times daily.   VITAMIN B 12 PO Take 1 tablet by mouth daily.        Objective:   BP (!) 143/63   Pulse 63   Ht 5\' 9"  (1.753 m)   LMP  (LMP Unknown)   SpO2 100%   BMI 24.07 kg/m   Wt Readings from Last 3 Encounters:  09/11/19 163 lb (73.9 kg)  08/13/19 161 lb 9.6 oz (73.3 kg)  07/02/19 160 lb (72.6 kg)    Physical Exam Vitals reviewed.  Constitutional:      General: She is not in acute distress.    Appearance: She is well-developed and well-nourished. She is not diaphoretic.  HENT:     Right Ear: Tympanic membrane, ear canal and external ear normal.     Left Ear: Tympanic membrane, ear canal and external ear normal.     Nose: Mucosal edema and rhinorrhea present. No epistaxis.     Right Sinus: No maxillary sinus tenderness or frontal sinus tenderness.     Left Sinus: No maxillary sinus tenderness or frontal sinus tenderness.     Mouth/Throat:     Mouth: Mucous membranes are normal.     Pharynx: Uvula midline. Posterior oropharyngeal edema and posterior oropharyngeal erythema present. No oropharyngeal exudate.     Tonsils: No tonsillar abscesses.  Eyes:     Extraocular Movements: EOM normal.     Conjunctiva/sclera: Conjunctivae normal.  Cardiovascular:     Rate and Rhythm: Normal rate and regular rhythm.     Pulses: Intact distal pulses.     Heart sounds: Normal heart sounds. No murmur  heard.   Pulmonary:     Effort: Pulmonary effort is normal. No respiratory distress.     Breath sounds: Normal breath sounds. No wheezing.  Musculoskeletal:        General: No tenderness or edema. Normal range of motion.  Skin:    General: Skin is warm and dry.     Findings: No rash.  Neurological:     Mental Status: She is alert and oriented to person, place, and time.     Coordination: Coordination normal.  Psychiatric:        Mood and Affect: Mood and affect normal.        Behavior: Behavior normal.       Assessment &  Plan:   Problem List Items Addressed This Visit   None   Visit Diagnoses    COPD exacerbation ([place])    -  Primary   Relevant Medications   predniSONE (DELTASONE) 20 MG tablet   amoxicillin-clavulanate (AUGMENTIN) 875-125 MG tablet   albuterol (VENTOLIN HFA) 108 (90 Base) MCG/ACT inhaler       Follow up plan: Return if symptoms worsen or fail to improve.  Counseling provided for all of the vaccine components No orders of the defined types were placed in this encounter.   Caryl Pina, MD [facility] 02/25/2020, 5:05 PM

## 2020-02-26 DIAGNOSIS — R5383 Other fatigue: Secondary | ICD-10-CM | POA: Insufficient documentation

## 2020-02-26 NOTE — Progress Notes (Signed)
Cardiology Office Note   Date:  02/27/2020   ID:  [name], DOB 06/20/1940, MRN [id number]  PCP:  Dettinger, Leslie Kaufmann, MD  Cardiologist:   No primary care provider on file.   Chief Complaint  Patient presents with  . Fatigue      History of Present Illness: [name] is a 80 y.o. female who presents for follow up of a presyncopal episode.  I saw her for chest pain in 2017.  She had a negative perfusion study.   In August 2019 she had episode of presyncope.    Since I last saw her she saw Dr. Claiborne Billings and is being treated for severe sleep apnea.   Unfortunately she cannot use the CPAP.  She has multiple somatic complaints.  Fatigue is still the predominant complaint.  She does have some episodes of lightheadedness and has had some presyncope although this is fairly infrequent and fleeting.  She has some fleeting sharp chest discomfort in the left chest.  She complains of bilateral shoulder pain.  She is up most of the night between the sleep apnea and urinary incontinence.  Tries to stay active.  She lives by herself.  She has callus that she has to feed.  She is not describing substernal chest pressure.  She is not describing PND or orthopnea.   Past Medical History:  Diagnosis Date  . Anal or rectal pain   . Anxiety state, unspecified   . Arthritis   . Arthropathy, unspecified, site unspecified   . Carpal tunnel syndrome    Bilateral  . Cataract   . Chronic airway obstruction, not elsewhere classified    patient denies on 02/27/14  . Esophageal reflux   . Esophageal stricture   . Hypertension   . Internal hemorrhoids without mention of complication   . Malignant neoplasm of breast (female), unspecified site    left breast   . Memory disorder 09/24/2013  . Nocturnal leg cramps 06/29/2018  . Osteopenia   . Other and unspecified hyperlipidemia   . Pain in joint, multiple sites   . Personal history of colonic polyps    adenomatous  . Polyneuropathy in other diseases  classified elsewhere ([place]) 09/24/2013  . Shingles   . Sleep apnea    no longer uses cpap  . Tubular adenoma of colon   . Unspecified hypothyroidism     Past Surgical History:  Procedure Laterality Date  . APPENDECTOMY  03/2014  . BACK SURGERY     X2  . BREAST LUMPECTOMY    . COLONOSCOPY  08/03/2018  . EYE SURGERY     cataracts bilateral  . LAPAROSCOPIC APPENDECTOMY N/A 02/28/2014   Procedure: APPENDECTOMY LAPAROSCOPIC PARTIAL CECECTOMY FOR MUCOCELE OF APPENDIX ;  Surgeon: Michael Boston, MD;  Location: [facility];  Service: General;  Laterality: N/A;  . MASTECTOMY     left  . THYROIDECTOMY    . TUBAL LIGATION       Current Outpatient Medications  Medication Sig Dispense Refill  . albuterol (VENTOLIN HFA) 108 (90 Base) MCG/ACT inhaler Inhale 2 puffs into the lungs every 6 (six) hours as needed for wheezing or shortness of breath. 8 g 0  . aspirin 81 MG tablet Take 81 mg by mouth daily.    . busPIRone (BUSPAR) 30 MG tablet TAKE (1/2) TO (1) TABLET BY MOUTH ONCE DAILY AS NEEDED (Patient taking differently: Take 15-30 mg by mouth daily as needed (anxiety).) 30 tablet 0  . Calcium Carbonate-Vitamin D 500-125 MG-UNIT  TABS Take 1 capsule by mouth daily.    . Cyanocobalamin (VITAMIN B 12 PO) Take 1 tablet by mouth daily.     Marland Kitchen dimenhyDRINATE (DRAMAMINE) 50 MG tablet Take 50 mg by mouth every 8 (eight) hours as needed.    . donepezil (ARICEPT) 10 MG tablet Take 1 tablet (10 mg total) by mouth at bedtime. 90 tablet 3  . ezetimibe (ZETIA) 10 MG tablet TAKE 1 TABLET BY MOUTH ONCE DAILY. (Needs to be seen before next refill) 30 tablet 0  . gabapentin (NEURONTIN) 300 MG capsule TAKE 1 CAPSULE EACH MORNING AND 2 AT BEDTIME. 270 capsule 3  . ibuprofen (ADVIL,MOTRIN) 200 MG tablet Take 800 mg by mouth every 6 (six) hours as needed (Pain).     Marland Kitchen levothyroxine (SYNTHROID) 112 MCG tablet Take 1 tablet (112 mcg total) by mouth daily before breakfast. (Needs to be seen before next refill) 30 tablet 0  .  lisinopril-hydrochlorothiazide (ZESTORETIC) 10-12.5 MG tablet Take 1 tablet by mouth daily. (Needs to be seen before next refill) 30 tablet 0  . Multiple Vitamin (MULTIVITAMIN) capsule Take 1 capsule by mouth daily.    . Omega-3 Fatty Acids (FISH OIL) 1000 MG CAPS Take 1,000 mg by mouth daily.    . Diclofenac Sodium 3 % GEL Apply 1 g topically 2 (two) times daily as needed. 60 g 3   No current facility-administered medications for this visit.    Allergies:   Atorvastatin, Meperidine hcl, Statins, Norco [hydrocodone-acetaminophen], and Sulfonamide derivatives   ROS:  Please see the history of present illness.   Otherwise, review of systems are positive for incontinence, anxiety, shoulder pain.   All other systems are reviewed and negative.    PHYSICAL EXAM: VS:  BP 118/62   Pulse 72   Ht 5' 9.5" (1.765 m)   Wt 167 lb (75.8 kg)   LMP  (LMP Unknown)   BMI 24.31 kg/m  , BMI Body mass index is 24.31 kg/m. GENERAL:  Well appearing NECK:  No jugular venous distention, waveform within normal limits, carotid upstroke brisk and symmetric, no bruits, no thyromegaly LUNGS:  Clear to auscultation bilaterally CHEST:  Unremarkable HEART:  PMI not displaced or sustained,S1 and S2 within normal limits, no S3, no S4, no clicks, no rubs, no murmurs ABD:  Flat, positive bowel sounds normal in frequency in pitch, no bruits, no rebound, no guarding, no midline pulsatile mass, no hepatomegaly, no splenomegaly EXT:  2 plus pulses throughout, no edema, no cyanosis no clubbing    EKG:  EKG is not ordered today.    Recent Labs: 03/28/2019: TSH 2.130 07/02/2019: ALT 21; BUN 18; Creatinine, Ser 0.50; Hemoglobin 12.6; Platelets 205; Potassium 3.6; Sodium 142    Lipid Panel    Component Value Date/Time   CHOL 197 03/28/2019 1507   CHOL 228 (H) 05/17/2012 1134   TRIG 98 03/28/2019 1507   TRIG 92 10/01/2015 1129   TRIG 88 05/17/2012 1134   HDL 48 03/28/2019 1507   HDL 61 10/01/2015 1129   HDL 55  05/17/2012 1134   CHOLHDL 4.1 03/28/2019 1507   LDLCALC 131 (H) 03/28/2019 1507   LDLCALC 161 (H) 09/28/2013 0823   LDLCALC 155 (H) 05/17/2012 1134   LDLDIRECT 164 (H) 06/07/2014 0858      Wt Readings from Last 3 Encounters:  02/27/20 167 lb (75.8 kg)  09/11/19 163 lb (73.9 kg)  08/13/19 161 lb 9.6 oz (73.3 kg)      Other studies Reviewed: Additional studies/ records  that were reviewed today include:  Sleep study notes. Review of the above records demonstrates:  Please see elsewhere in the note.     ASSESSMENT AND PLAN:  BRADYCARDIA:     She has had some lightheaded episodes but they are infrequent.  She did not have any bradycardia arrhythmias on the monitor in 2019 and I do not think her symptoms are frequent enough to suggest that a 4-week monitor would be helpful.  But she knows that she should let me know if they increase in frequency at which point I might need for her to wear a monitor.  HTN:  The blood pressure is at target.  No change in therapy.   FATIGUE:   Unfortunately I think this is likely related to the sleep apnea.  I do not think this is related to bradycardia arrhythmias.  She was not anemic last year.  Thyroid was normal.  She unfortunately does not want further attempts of therapy for her sleep apnea.   SLEEP APNEA:   As above.   URINARY INCONTINENCE: I am going to take the liberty of referring her to urology    Current medicines are reviewed at length with the patient today.  The patient does not have concerns regarding medicines.  The following changes have been made:  no change  Labs/ tests ordered today include:   Orders Placed This Encounter  Procedures  . Ambulatory referral to Urology     Disposition:   FU with me in one year.     Signed, Minus Breeding, MD  02/27/2020 4:04 PM    [facility]

## 2020-02-27 ENCOUNTER — Ambulatory Visit: Payer: Medicare Other | Admitting: Cardiology

## 2020-02-27 ENCOUNTER — Other Ambulatory Visit: Payer: Self-pay

## 2020-02-27 ENCOUNTER — Encounter: Payer: Self-pay | Admitting: Cardiology

## 2020-02-27 VITALS — BP 118/62 | HR 72 | Ht 69.5 in | Wt 167.0 lb

## 2020-02-27 DIAGNOSIS — R001 Bradycardia, unspecified: Secondary | ICD-10-CM

## 2020-02-27 DIAGNOSIS — R5383 Other fatigue: Secondary | ICD-10-CM | POA: Diagnosis not present

## 2020-02-27 DIAGNOSIS — R32 Unspecified urinary incontinence: Secondary | ICD-10-CM | POA: Diagnosis not present

## 2020-02-27 DIAGNOSIS — I10 Essential (primary) hypertension: Secondary | ICD-10-CM | POA: Diagnosis not present

## 2020-02-27 NOTE — Patient Instructions (Signed)
Medication Instructions:  The current medical regimen is effective;  continue present plan and medications.  *If you need a refill on your cardiac medications before your next appointment, please call your pharmacy*  Follow-Up: At [facility], you and your health needs are our priority.  As part of our continuing mission to provide you with exceptional heart care, we have created designated Provider Care Teams.  These Care Teams include your primary Cardiologist (physician) and Advanced Practice Providers (APPs -  Physician Assistants and Nurse Practitioners) who all work together to provide you with the care you need, when you need it.  We recommend signing up for the patient portal called "MyChart".  Sign up information is provided on this After Visit Summary.  MyChart is used to connect with patients for Virtual Visits (Telemedicine).  Patients are able to view lab/test results, encounter notes, upcoming appointments, etc.  Non-urgent messages can be sent to your provider as well.   To learn more about what you can do with MyChart, go to [email or web address].    Your next appointment:   12 month(s)  The format for your next appointment:   In Person  Provider:   Minus Breeding, MD   Thank you for choosing [facility]!!    Dr Louis Meckel [place], [place] [phone] [phone]

## 2020-03-10 ENCOUNTER — Other Ambulatory Visit: Payer: Self-pay | Admitting: Family Medicine

## 2020-03-24 ENCOUNTER — Ambulatory Visit: Payer: Medicare Other | Admitting: Family Medicine

## 2020-04-11 ENCOUNTER — Other Ambulatory Visit: Payer: Self-pay

## 2020-04-11 ENCOUNTER — Ambulatory Visit (INDEPENDENT_AMBULATORY_CARE_PROVIDER_SITE_OTHER): Payer: Medicare Other

## 2020-04-11 ENCOUNTER — Ambulatory Visit (INDEPENDENT_AMBULATORY_CARE_PROVIDER_SITE_OTHER): Payer: Medicare Other | Admitting: Family Medicine

## 2020-04-11 ENCOUNTER — Encounter: Payer: Self-pay | Admitting: Family Medicine

## 2020-04-11 VITALS — BP 101/62 | HR 66 | Ht 69.5 in | Wt 163.0 lb

## 2020-04-11 DIAGNOSIS — Z0000 Encounter for general adult medical examination without abnormal findings: Secondary | ICD-10-CM | POA: Diagnosis not present

## 2020-04-11 DIAGNOSIS — J439 Emphysema, unspecified: Secondary | ICD-10-CM

## 2020-04-11 DIAGNOSIS — E7800 Pure hypercholesterolemia, unspecified: Secondary | ICD-10-CM

## 2020-04-11 DIAGNOSIS — Z1283 Encounter for screening for malignant neoplasm of skin: Secondary | ICD-10-CM | POA: Diagnosis not present

## 2020-04-11 DIAGNOSIS — J441 Chronic obstructive pulmonary disease with (acute) exacerbation: Secondary | ICD-10-CM | POA: Diagnosis not present

## 2020-04-11 DIAGNOSIS — R413 Other amnesia: Secondary | ICD-10-CM

## 2020-04-11 DIAGNOSIS — I10 Essential (primary) hypertension: Secondary | ICD-10-CM

## 2020-04-11 DIAGNOSIS — Z780 Asymptomatic menopausal state: Secondary | ICD-10-CM | POA: Diagnosis not present

## 2020-04-11 DIAGNOSIS — Z0001 Encounter for general adult medical examination with abnormal findings: Secondary | ICD-10-CM

## 2020-04-11 DIAGNOSIS — M85832 Other specified disorders of bone density and structure, left forearm: Secondary | ICD-10-CM | POA: Diagnosis not present

## 2020-04-11 DIAGNOSIS — M85851 Other specified disorders of bone density and structure, right thigh: Secondary | ICD-10-CM | POA: Diagnosis not present

## 2020-04-11 DIAGNOSIS — E890 Postprocedural hypothyroidism: Secondary | ICD-10-CM

## 2020-04-11 MED ORDER — LISINOPRIL 2.5 MG PO TABS: 2.5000 mg | ORAL_TABLET | Freq: Every day | ORAL | 3 refills | Status: DC

## 2020-04-11 MED ORDER — EZETIMIBE 10 MG PO TABS: 10.0000 mg | ORAL_TABLET | Freq: Every day | ORAL | 3 refills | Status: DC

## 2020-04-11 MED ORDER — ALBUTEROL SULFATE HFA 108 (90 BASE) MCG/ACT IN AERS
2.0000 | INHALATION_SPRAY | Freq: Four times a day (QID) | RESPIRATORY_TRACT | 5 refills | Status: DC | PRN
Start: 2020-04-11 — End: 2020-08-04

## 2020-04-11 MED ORDER — HYDROCHLOROTHIAZIDE 12.5 MG PO TABS: 12.5000 mg | ORAL_TABLET | Freq: Every day | ORAL | 3 refills | Status: DC

## 2020-04-11 MED ORDER — MEMANTINE HCL 5 MG PO TABS: 5.0000 mg | ORAL_TABLET | Freq: Two times a day (BID) | ORAL | 3 refills | Status: DC

## 2020-04-11 MED ORDER — LEVOTHYROXINE SODIUM 112 MCG PO TABS: 112.0000 ug | ORAL_TABLET | Freq: Every day | ORAL | 3 refills | Status: DC

## 2020-04-11 MED ORDER — BUSPIRONE HCL 30 MG PO TABS: 15.0000 mg | ORAL_TABLET | Freq: Every day | ORAL | 3 refills | Status: DC | PRN

## 2020-04-11 NOTE — Progress Notes (Signed)
BP 101/62   Pulse 66   Ht 5' 9.5" (1.765 m)   Wt 163 lb (73.9 kg)   LMP  (LMP Unknown)   SpO2 98%   BMI 23.73 kg/m    Subjective:   Patient ID: [name], female    DOB: 03/11/1940, 80 y.o.   MRN: [id number]  HPI: [name] is a 80 y.o. female presenting on 04/11/2020 for Medical Management of Chronic Issues, COPD, and Hypothyroidism   HPI  Adult well exam and physical Patient is coming in for adult well exam and physical chronic check. Patient denies any chest pain, shortness of breath, headaches or vision issues, abdominal complaints, diarrhea, nausea, vomiting, or joint issues.   Hypothyroidism recheck Patient is coming in for thyroid recheck today as well. They deny any issues with hair changes or heat or cold problems or diarrhea or constipation. They deny any chest pain or palpitations. They are currently on levothyroxine 112 micrograms   Hypertension Patient is currently on lisinopril hydrochlorothiazide, and their blood pressure today is 101/62.  Patient says she has been getting lightheaded sometimes and she stumbles sometimes because her blood pressure is low and because she has been deconditioned. Patient denies headaches, blurred vision, chest pains, shortness of breath, or weakness. Denies any side effects from medication and is content with current medication.   Hyperlipidemia Patient is coming in for recheck of his hyperlipidemia. The patient is currently taking fish oil and Zetia. They deny any issues with myalgias or history of liver damage from it. They deny any focal numbness or weakness or chest pain.   Memory issues. Patient has been having memory issues and says that they have worsened recently and feels like the Aricept is not helping as much as it used to.  COPD Patient is coming in for COPD recheck today.  He is currently on no medication and breathing is doing fine, has albuterol if needed.  He has a mild chronic cough but denies any major coughing  spells or wheezing spells.  He has 0nighttime symptoms per week and 0daytime symptoms per week currently.    Patient is postmenopausal and get a bone density scan today.  Relevant past medical, surgical, family and social history reviewed and updated as indicated. Interim medical history since our last visit reviewed. Allergies and medications reviewed and updated.  Review of Systems  Constitutional: Negative for chills and fever.  HENT: Negative for congestion, ear discharge and ear pain.   Eyes: Negative for redness and visual disturbance.  Respiratory: Negative for chest tightness and shortness of breath.   Cardiovascular: Negative for chest pain and leg swelling.  Genitourinary: Negative for difficulty urinating and dysuria.  Musculoskeletal: Negative for back pain and gait problem.  Skin: Negative for rash.  Neurological: Negative for light-headedness and headaches.  Psychiatric/Behavioral: Positive for confusion. Negative for agitation, behavioral problems, decreased concentration, dysphoric mood, self-injury, sleep disturbance and suicidal ideas. The patient is not nervous/anxious.   All other systems reviewed and are negative.   Per HPI unless specifically indicated above   Allergies as of 04/11/2020      Reactions   Atorvastatin Other (See Comments)   Muscle cramps   Meperidine Hcl Nausea And Vomiting   Makes me 'deathly' sick   Statins Other (See Comments)   REACTION: Patient cannot tolerate due cramps   Norco [hydrocodone-acetaminophen] Nausea And Vomiting   Sulfonamide Derivatives Other (See Comments)   Unknown reaction      Medication List  Accurate as of April 11, 2020 10:24 AM. If you have any questions, ask your nurse or doctor.        albuterol 108 (90 Base) MCG/ACT inhaler Commonly known as: VENTOLIN HFA Inhale 2 puffs into the lungs every 6 (six) hours as needed for wheezing or shortness of breath.   aspirin 81 MG tablet Take 81 mg by mouth  daily.   busPIRone 30 MG tablet Commonly known as: BUSPAR TAKE (1/2) TO (1) TABLET BY MOUTH ONCE DAILY AS NEEDED What changed: See the new instructions.   Calcium Carbonate-Vitamin D 500-125 MG-UNIT Tabs Take 1 capsule by mouth daily.   Diclofenac Sodium 3 % Gel Apply 1 g topically 2 (two) times daily as needed.   dimenhyDRINATE 50 MG tablet Commonly known as: DRAMAMINE Take 50 mg by mouth every 8 (eight) hours as needed.   donepezil 10 MG tablet Commonly known as: ARICEPT Take 1 tablet (10 mg total) by mouth at bedtime.   ezetimibe 10 MG tablet Commonly known as: ZETIA TAKE 1 TABLET BY MOUTH ONCE DAILY. (Needs to be seen before next refill)   Fish Oil 1000 MG Caps Take 1,000 mg by mouth daily.   gabapentin 300 MG capsule Commonly known as: NEURONTIN TAKE 1 CAPSULE EACH MORNING AND 2 AT BEDTIME.   ibuprofen 200 MG tablet Commonly known as: ADVIL Take 800 mg by mouth every 6 (six) hours as needed (Pain).   levothyroxine 112 MCG tablet Commonly known as: SYNTHROID Take 1 tablet (112 mcg total) by mouth daily before breakfast. (NEEDS TO BE SEEN BEFORE NEXT REFILL)   lisinopril-hydrochlorothiazide 10-12.5 MG tablet Commonly known as: ZESTORETIC Take 1 tablet by mouth daily. (NEEDS TO BE SEEN BEFORE NEXT REFILL)   multivitamin capsule Take 1 capsule by mouth daily.   VITAMIN B 12 PO Take 1 tablet by mouth daily.        Objective:   BP 101/62   Pulse 66   Ht 5' 9.5" (1.765 m)   Wt 163 lb (73.9 kg)   LMP  (LMP Unknown)   SpO2 98%   BMI 23.73 kg/m   Wt Readings from Last 3 Encounters:  04/11/20 163 lb (73.9 kg)  02/27/20 167 lb (75.8 kg)  09/11/19 163 lb (73.9 kg)    Physical Exam Vitals and nursing note reviewed.  Constitutional:      General: She is not in acute distress.    Appearance: She is well-developed. She is not diaphoretic.  Eyes:     Conjunctiva/sclera: Conjunctivae normal.  Cardiovascular:     Rate and Rhythm: Normal rate and regular  rhythm.     Heart sounds: Normal heart sounds. No murmur heard.   Pulmonary:     Effort: Pulmonary effort is normal. No respiratory distress.     Breath sounds: Normal breath sounds. No wheezing.  Musculoskeletal:        General: No tenderness. Normal range of motion.  Skin:    General: Skin is warm and dry.     Findings: No rash.  Neurological:     Mental Status: She is alert and oriented to person, place, and time.     Coordination: Coordination normal.  Psychiatric:        Mood and Affect: Mood is not anxious or depressed.        Behavior: Behavior normal.        Thought Content: Thought content does not include suicidal ideation. Thought content does not include suicidal plan.  Cognition and Memory: Memory is not impaired. She exhibits impaired recent memory. She does not exhibit impaired remote memory.       Assessment & Plan:   Problem List Items Addressed This Visit      Cardiovascular and Mediastinum   HTN (hypertension)   Relevant Medications   ezetimibe (ZETIA) 10 MG tablet   lisinopril (ZESTRIL) 2.5 MG tablet   hydrochlorothiazide (HYDRODIURIL) 12.5 MG tablet   Other Relevant Orders   CMP14+EGFR     Respiratory   COPD (chronic obstructive pulmonary disease) (HCC)   Relevant Medications   albuterol (VENTOLIN HFA) 108 (90 Base) MCG/ACT inhaler     Endocrine   Hypothyroidism, postsurgical   Relevant Medications   levothyroxine (SYNTHROID) 112 MCG tablet   Other Relevant Orders   TSH     Other   Memory disorder (Chronic)   Relevant Medications   memantine (NAMENDA) 5 MG tablet   Hyperlipidemia   Relevant Medications   ezetimibe (ZETIA) 10 MG tablet   lisinopril (ZESTRIL) 2.5 MG tablet   hydrochlorothiazide (HYDRODIURIL) 12.5 MG tablet   Other Relevant Orders   CMP14+EGFR   Lipid panel    Other Visit Diagnoses    Physical exam    -  Primary   Relevant Medications   albuterol (VENTOLIN HFA) 108 (90 Base) MCG/ACT inhaler   Other Relevant  Orders   DG [facility] DEXA   CBC with Differential/Platelet   CMP14+EGFR   Lipid panel   TSH   Postmenopausal       Relevant Orders   DG [facility] DEXA   COPD exacerbation (HCC)       Relevant Medications   albuterol (VENTOLIN HFA) 108 (90 Base) MCG/ACT inhaler   Other Relevant Orders   CBC with Differential/Platelet    Will switch patient from Aricept to Namenda and see how she does with memory.  We will lower her blood pressure medicine, she is to where she is on taking lisinopril 2.5 and HCTZ 12.5 because of low blood pressure.  Continue other medicines, will check blood work today.  Follow up plan: Return in about 6 months (around 10/11/2020), or if symptoms worsen or fail to improve, for Thyroid and COPD.  Counseling provided for all of the vaccine components No orders of the defined types were placed in this encounter.   Caryl Pina, MD [facility] 04/11/2020, 10:24 AM

## 2020-04-11 NOTE — Addendum Note (Signed)
Addended by: Caryl Pina on: 04/11/2020 10:51 AM   Modules accepted: Orders

## 2020-04-12 LAB — CBC WITH DIFFERENTIAL/PLATELET
Basophils Absolute: 0 10*3/uL (ref 0.0–0.2)
Basos: 1 %
EOS (ABSOLUTE): 0.3 10*3/uL (ref 0.0–0.4)
Eos: 5 %
Hematocrit: 39.3 % (ref 34.0–46.6)
Hemoglobin: 13.1 g/dL (ref 11.1–15.9)
Immature Grans (Abs): 0 10*3/uL (ref 0.0–0.1)
Immature Granulocytes: 0 %
Lymphocytes Absolute: 1.6 10*3/uL (ref 0.7–3.1)
Lymphs: 29 %
MCH: 30.4 pg (ref 26.6–33.0)
MCHC: 33.3 g/dL (ref 31.5–35.7)
MCV: 91 fL (ref 79–97)
Monocytes Absolute: 0.4 10*3/uL (ref 0.1–0.9)
Monocytes: 8 %
Neutrophils Absolute: 3.1 10*3/uL (ref 1.4–7.0)
Neutrophils: 57 %
Platelets: 229 10*3/uL (ref 150–450)
RBC: 4.31 x10E6/uL (ref 3.77–5.28)
RDW: 13.2 % (ref 11.7–15.4)
WBC: 5.4 10*3/uL (ref 3.4–10.8)

## 2020-04-12 LAB — CMP14+EGFR
ALT: 19 IU/L (ref 0–32)
AST: 25 IU/L (ref 0–40)
Albumin/Globulin Ratio: 2.5 — ABNORMAL HIGH (ref 1.2–2.2)
Albumin: 4.5 g/dL (ref 3.7–4.7)
Alkaline Phosphatase: 57 IU/L (ref 44–121)
BUN/Creatinine Ratio: 25 (ref 12–28)
BUN: 19 mg/dL (ref 8–27)
Bilirubin Total: 0.6 mg/dL (ref 0.0–1.2)
CO2: 22 mmol/L (ref 20–29)
Calcium: 9.2 mg/dL (ref 8.7–10.3)
Chloride: 102 mmol/L (ref 96–106)
Creatinine, Ser: 0.75 mg/dL (ref 0.57–1.00)
Globulin, Total: 1.8 g/dL (ref 1.5–4.5)
Glucose: 90 mg/dL (ref 65–99)
Potassium: 4.5 mmol/L (ref 3.5–5.2)
Sodium: 141 mmol/L (ref 134–144)
Total Protein: 6.3 g/dL (ref 6.0–8.5)
eGFR: 81 mL/min/{1.73_m2} (ref >59)

## 2020-04-12 LAB — LIPID PANEL
Chol/HDL Ratio: 4.8 ratio — ABNORMAL HIGH (ref 0.0–4.4)
Cholesterol, Total: 263 mg/dL — ABNORMAL HIGH (ref 100–199)
HDL: 55 mg/dL (ref >39)
LDL Chol Calc (NIH): 195 mg/dL — ABNORMAL HIGH (ref 0–99)
Triglycerides: 79 mg/dL (ref 0–149)
VLDL Cholesterol Cal: 13 mg/dL (ref 5–40)

## 2020-04-12 LAB — TSH: TSH: 7.18 u[IU]/mL — ABNORMAL HIGH (ref 0.450–4.500)

## 2020-04-16 DIAGNOSIS — R3915 Urgency of urination: Secondary | ICD-10-CM | POA: Diagnosis not present

## 2020-04-21 ENCOUNTER — Ambulatory Visit (HOSPITAL_COMMUNITY)
Admission: RE | Admit: 2020-04-21 | Discharge: 2020-04-21 | Disposition: A | Discharge status: Home / Self Care | Payer: Medicare Other | Source: Ambulatory Visit | Attending: Family Medicine | Admitting: Family Medicine

## 2020-04-21 ENCOUNTER — Other Ambulatory Visit: Payer: Self-pay

## 2020-04-21 DIAGNOSIS — Z1231 Encounter for screening mammogram for malignant neoplasm of breast: Secondary | ICD-10-CM | POA: Diagnosis not present

## 2020-04-22 ENCOUNTER — Encounter: Payer: Self-pay | Admitting: Family Medicine

## 2020-04-24 ENCOUNTER — Encounter: Payer: Self-pay | Admitting: Family Medicine

## 2020-04-28 ENCOUNTER — Telehealth: Payer: Self-pay

## 2020-04-28 MED ORDER — LEVOTHYROXINE SODIUM 125 MCG PO TABS: 125.0000 ug | ORAL_TABLET | Freq: Every day | ORAL | 0 refills | Status: DC

## 2020-04-28 NOTE — Telephone Encounter (Signed)
Pt returned missed call regarding her dexa and lab results. Reviewed results with pt per Dr Neldon Mc notes. Pt voiced understanding and will pick up new thyroid script at the pharmacy. (NOTE: last levothyroxine Rx sent in for her was on 04/11/20 for 130mcg and needs to be sent for 141mcg per dettinger). Pt says she doesn't know why her cholesterol is so high because she takes her Zetia everyday and stays plenty hydrated and does not eat bad. Pt wants to know what Dr Dettinger wants her to do?

## 2020-05-12 ENCOUNTER — Ambulatory Visit: Payer: Medicare Other | Admitting: Neurology

## 2020-07-14 ENCOUNTER — Other Ambulatory Visit: Payer: Self-pay | Admitting: Family Medicine

## 2020-07-29 ENCOUNTER — Ambulatory Visit (INDEPENDENT_AMBULATORY_CARE_PROVIDER_SITE_OTHER): Payer: Medicare Other | Admitting: Family

## 2020-07-29 ENCOUNTER — Encounter: Payer: Self-pay | Admitting: Family

## 2020-07-29 VITALS — BP 111/70 | HR 72 | Temp 97.6°F | Ht 69.5 in | Wt 160.0 lb

## 2020-07-29 DIAGNOSIS — R21 Rash and other nonspecific skin eruption: Secondary | ICD-10-CM | POA: Diagnosis not present

## 2020-07-29 DIAGNOSIS — W57XXXA Bitten or stung by nonvenomous insect and other nonvenomous arthropods, initial encounter: Secondary | ICD-10-CM | POA: Diagnosis not present

## 2020-07-29 MED ORDER — PREDNISONE 20 MG PO TABS: 40.0000 mg | ORAL_TABLET | Freq: Every day | ORAL | 0 refills | Status: AC

## 2020-07-29 MED ORDER — TRIAMCINOLONE ACETONIDE 0.5 % EX OINT: 1.0000 "application " | TOPICAL_OINTMENT | Freq: Two times a day (BID) | CUTANEOUS | 1 refills | Status: DC

## 2020-07-29 NOTE — Progress Notes (Signed)
Subjective:    Patient ID: [name], female    DOB: 1940-12-08, 80 y.o.   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 They are know + for COVID    PT presents to the office today rash on bilateral feet that she noticed two weeks ago. She reports she was exposed to fleas. She states bilateral legs erythemas and slightly swollen. She has tried calamine lotion and OTC creams for bites.   Rash This is a new problem. The current episode started 1 to 4 weeks ago. The problem is unchanged. The affected locations include the right toes, right foot, left foot, left toes, left ankle and right ankle. The rash is characterized by itchiness.     Review of Systems  Skin:  Positive for rash.  All other systems reviewed and are negative.     Objective:   Physical Exam Vitals reviewed.  Constitutional:      General: She is not in acute distress.    Appearance: She is well-developed.  HENT:     Head: Normocephalic and atraumatic.     Right Ear: Tympanic membrane normal.     Left Ear: Tympanic membrane normal.  Eyes:     Pupils: Pupils are equal, round, and reactive to light.  Neck:     Thyroid: No thyromegaly.  Cardiovascular:     Rate and Rhythm: Normal rate and regular rhythm.     Heart sounds: Normal heart sounds. No murmur heard. Pulmonary:     Effort: Pulmonary effort is normal. No respiratory distress.     Breath sounds: Normal breath sounds. No wheezing.  Abdominal:     General: Bowel sounds are normal. There is no distension.     Palpations: Abdomen is soft.     Tenderness: There is no abdominal tenderness.  Musculoskeletal:        General: No tenderness. Normal range of motion.     Cervical back: Normal range of motion and neck supple.  Skin:    General: Skin is warm and dry.     Findings: Rash present.     Comments: Scattered erythemas circular bites on bilateral ankles and feet   Neurological:      Mental Status: She is alert and oriented to person, place, and time.     Cranial Nerves: No cranial nerve deficit.     Deep Tendon Reflexes: Reflexes are normal and symmetric.  Psychiatric:        Behavior: Behavior normal.        Thought Content: Thought content normal.        Judgment: Judgment normal.    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 They are know + for COVID )   Diagnosis and orders addressed:  1. Flea bite, initial encounter Avoid scratching  Avoid fleas  Steroid cream and pills  Cool baths  RTO if symptoms worsen or do not improve  - triamcinolone ointment (KENALOG) 0.5 %; Apply 1 application topically 2 (two) times daily.  Dispense: 60  g; Refill: 1 - predniSONE (DELTASONE) 20 MG tablet; Take 2 tablets (40 mg total) by mouth daily with breakfast for 3 days.  Dispense: 6 tablet; Refill: 0    [name], FNP

## 2020-07-29 NOTE — Patient Instructions (Signed)
Insect Bite, Adult An insect bite can make your skin red, itchy, and swollen. An insect bite is different from an insect sting, which happens when an insect injects poison (venom) into the skin. Some insects can spread disease to people through a bite. However, most insectbites do not lead to disease and are not serious. What are the causes? Insects may bite for a variety of reasons, including: Hunger. To defend themselves. Insects that bite include: Spiders. Mosquitoes. Ticks. Fleas. Ants. Flies. Kissing bugs. Chiggers. What are the signs or symptoms? Symptoms of this condition include: Itching or pain in the bite area. Redness and swelling in the bite area. An open wound (skin ulcer). In many cases, symptoms last for 2-4 days. In rare cases, a person may have a severe allergic reaction (anaphylactic reaction) to a bite. Symptoms of an anaphylactic reaction may include: Feeling warm in the face (flushed). This may include redness. Itchy, red, swollen areas of skin (hives). Swelling of the eyes, lips, face, mouth, tongue, or throat. Difficulty breathing, speaking, or swallowing. Noisy breathing (wheezing). Dizziness or light-headedness. Fainting. Pain or cramping in the abdomen. Vomiting. Diarrhea. How is this diagnosed? This condition is usually diagnosed based on symptoms and a physical exam. How is this treated? Treatment is usually not needed. Symptoms often go away on their own. When treatment is recommended, it may involve: Applying a cream or lotion to the bite area. This treatment helps with itching. Taking an antibiotic medicine. This treatment is needed if the bite area gets infected. Getting a tetanus shot, if you are not up to date on this vaccine. Applying ice to the affected area. Allergy medicines called antihistamines. This treatment may be needed if you develop itching or an allergic reaction to the insect bite. Giving yourself an epinephrine injection if  you have an anaphylactic reaction to a bite. To give the injection, you will use what is commonly called an auto-injector "pen" (pre-filled automatic epinephrine injection device). Your health care provider will teach you how to use an auto-injector pen. Follow these instructions at home: Bite area care  Do not scratch the bite area. Keep the bite area clean and dry. Wash it every day with soap and water as told by your health care provider. Check the bite area every day for signs of infection. Check for: Redness, swelling, or pain. Fluid or blood. Warmth. Pus or a bad smell.  Managing pain, itching, and swelling  You may apply cortisone cream, calamine lotion, or a paste made of baking soda and water to the bite area as told by your health care provider. If directed, put ice on the bite area. Put ice in a plastic bag. Place a towel between your skin and the bag. Leave the ice on for 20 minutes, 2-3 times a day.  General instructions Apply or take over-the-counter and prescription medicines only as told by your health care provider. If you were prescribed an antibiotic medicine, take or apply it as told by your health care provider. Do not stop using the antibiotic even if your condition improves. Keep all follow-up visits as told by your health care provider. This is important. How is this prevented? To help reduce your risk of insect bites: When you are outdoors, wear clothing that covers your arms and legs. This is especially important in the early morning and evening. Use insect repellent. The best insect repellents contain DEET, picaridin, oil of lemon eucalyptus (OLE), or IR3535. Consider spraying your clothing with a pesticide called permethrin.  Permethrin helps prevent insect bites. It works for several weeks and for up to 5-6 clothing washes. Do not apply permethrin directly to the skin. If your home windows do not have screens, consider installing them. If you will be sleeping  in an area where there are mosquitoes, consider covering your sleeping area with a mosquito net. Contact a health care provider if: You have redness, swelling, or pain in the bite area. You have fluid or blood coming from the bite area. The bite area feels warm to the touch. You have pus or a bad smell coming from the bite area. You have a fever. Get help right away if: You have joint pain. You have a rash. You feel unusually tired or sleepy. You have neck pain. You have a headache. You have unusual weakness. You develop symptoms of an anaphylactic reaction. These may include: Flushed skin. Hives. Swelling of the eyes, lips, face, mouth, tongue, or throat. Difficulty breathing, speaking, or swallowing. Wheezing. Dizziness or light-headedness. Fainting. Pain or cramping in the abdomen. Vomiting. Diarrhea. These symptoms may represent a serious problem that is an emergency. Do not wait to see if the symptoms will go away. Do the following right away: Use the auto-injector pen as you have been instructed. Get medical help. Call your local emergency services (911 in the [place] Do not drive yourself to the hospital. Summary An insect bite can make your skin red, itchy, and swollen. Treatment is usually not needed. Symptoms often go away on their own. When treatment is recommended, it may involve taking medicine, applying medicine to the area, or applying ice. Apply or take over-the-counter and prescription medicines only as told by your health care provider. Use insect repellent to help prevent insect bites. Contact a health care provider if you have any signs of infection in the bite area. This information is not intended to replace advice given to you by your health care provider. Make sure you discuss any questions you have with your healthcare provider. Document Revised: 06/30/2017 Document Reviewed: 07/01/2017 Elsevier Patient Education  [place].

## 2020-08-04 ENCOUNTER — Ambulatory Visit (INDEPENDENT_AMBULATORY_CARE_PROVIDER_SITE_OTHER): Payer: Medicare Other | Admitting: Family

## 2020-08-04 ENCOUNTER — Encounter: Payer: Self-pay | Admitting: Family

## 2020-08-04 DIAGNOSIS — U071 COVID-19: Secondary | ICD-10-CM | POA: Diagnosis not present

## 2020-08-04 DIAGNOSIS — J439 Emphysema, unspecified: Secondary | ICD-10-CM

## 2020-08-04 MED ORDER — FLUTICASONE PROPIONATE 50 MCG/ACT NA SUSP
2.0000 | Freq: Every day | NASAL | 6 refills | Status: AC
Start: 2020-08-04 — End: ?

## 2020-08-04 MED ORDER — ALBUTEROL SULFATE HFA 108 (90 BASE) MCG/ACT IN AERS: 2.0000 | INHALATION_SPRAY | Freq: Four times a day (QID) | RESPIRATORY_TRACT | 0 refills | Status: AC | PRN

## 2020-08-04 MED ORDER — BENZONATATE 200 MG PO CAPS: 200.0000 mg | ORAL_CAPSULE | Freq: Three times a day (TID) | ORAL | 1 refills | Status: DC | PRN

## 2020-08-04 NOTE — Progress Notes (Signed)
Virtual Visit  Note Due to COVID-19 pandemic this visit was conducted virtually. This visit type was conducted due to national recommendations for restrictions regarding the COVID-19 Pandemic (e.g. social distancing, sheltering in place) in an effort to limit this patient's exposure and mitigate transmission in our community. All issues noted in this document were discussed and addressed.  A physical exam was not performed with this format.  I connected with [name] on 08/04/20 at 9:50 AM  by telephone and verified that I am speaking with the correct person using two identifiers. [name] is currently located at home and no one  is currently with her during visit. The provider, [name], FNP is located in their office at time of visit.  I discussed the limitations, risks, security and privacy concerns of performing an evaluation and management service by telephone and the availability of in person appointments. I also discussed with the patient that there may be a patient responsible charge related to this service. The patient expressed understanding and agreed to proceed.   History and Present Illness:  PT calls the office today with positive COVID. She reports her symptoms started on 07/29/20 and she took a COVID test on 07/30/20 that was positive.  Cough This is a new problem. The current episode started in the past 7 days. The problem has been waxing and waning. The problem occurs every few minutes. The cough is Non-productive. Associated symptoms include ear pain, a fever, headaches, myalgias, nasal congestion, postnasal drip and shortness of breath. Pertinent negatives include no ear congestion or wheezing. She has tried rest and oral steroids for the symptoms. The treatment provided mild relief. Her past medical history is significant for COPD.    Review of Systems  Constitutional:  Positive for fever.  HENT:  Positive for ear pain and postnasal drip.   Respiratory:   Positive for cough and shortness of breath. Negative for wheezing.   Musculoskeletal:  Positive for myalgias.  Neurological:  Positive for headaches.  All other systems reviewed and are negative.   Observations/Objective: No SOB or distress,   Assessment and Plan: 1. COVID-19 virus detected COVID positive, rest, force fluids, tylenol as needed, Quarantine for at least 5 days and fever free, report any worsening symptoms such as increased shortness of breath, swelling, or continued high fevers.  Out of the 5 day window for antivirals  Do not hesitate to go to ED  - albuterol (VENTOLIN HFA) 108 (90 Base) MCG/ACT inhaler; Inhale 2 puffs into the lungs every 6 (six) hours as needed for wheezing or shortness of breath.  Dispense: 8 g; Refill: 0 - benzonatate (TESSALON) 200 MG capsule; Take 1 capsule (200 mg total) by mouth 3 (three) times daily as needed.  Dispense: 30 capsule; Refill: 1 - fluticasone (FLONASE) 50 MCG/ACT nasal spray; Place 2 sprays into both nostrils daily.  Dispense: 16 g; Refill: 6  2. Pulmonary emphysema, unspecified emphysema type ([place])        I discussed the assessment and treatment plan with the patient. The patient was provided an opportunity to ask questions and all were answered. The patient agreed with the plan and demonstrated an understanding of the instructions.   The patient was advised to call back or seek an in-person evaluation if the symptoms worsen or if the condition fails to improve as anticipated.  The above assessment and management plan was discussed with the patient. The patient verbalized understanding of and has agreed to the management plan. Patient is  aware to call the clinic if symptoms persist or worsen. Patient is aware when to return to the clinic for a follow-up visit. Patient educated on when it is appropriate to go to the emergency department.   Time call ended:  10:05 AM   I provided 15 minutes of  non face-to-face time during this  encounter.    [name], FNP

## 2020-08-12 ENCOUNTER — Encounter: Payer: Self-pay | Admitting: *Deleted

## 2020-08-12 ENCOUNTER — Telehealth: Payer: Self-pay | Admitting: Neurology

## 2020-08-12 NOTE — Telephone Encounter (Signed)
Patient returned call.  Patient is very concerned about falling, has had multiple falls over the past couple months.  She said she called [facility] (she heard about them from a TV commercial).  She heard about electrical shock therapy and nutrition to help with falling and weakness.  She said they put her feet in something and kept asking her when she felt the stimuli and then told her she has peripheral neuropathy.  She was then sent home with boots that go on her legs and a pan she puts her feet in for treatment with 2 black paddles that goes in the pan.  They are refusing to file her insurance.   She likes the treatment and feels she can safely do them at home.  She wants to return all this and find somebody who she could be referred to and not have to pay for it out of pocket.  She wants to go through a doctor on her insurance to have the treatments done at home.  She said she has talked to her insurance and they told her they would pay for it but not through where she went.    She is asking for a referral to a licensed medical practice that would be covered by her insurance.

## 2020-08-12 NOTE — Telephone Encounter (Signed)
Attempted to return the call to the patient twice at the home number below. Phone rings repeatedly with no answer or voicemail. Left message on her mobile number. Also, sent a mychart message to the patient. We are happy to help, when she contacts [place] back.

## 2020-08-12 NOTE — Telephone Encounter (Signed)
Pt called stating her cognitive ability is getting worse and she is having a hard time walking, says she stumbles a lot. Pt is requesting a call back.

## 2020-08-13 NOTE — Telephone Encounter (Signed)
Pt has been scheduled for 08/27/2020 at 1200 pm.

## 2020-08-13 NOTE — Telephone Encounter (Signed)
I called the patient.  The patient indicates that she believes that her ability to ambulate safely has significantly changed over the last several months.  She is now falling more frequently.  Last clinical examination suggested only mild gait disorder previously, she did have evidence on upper extremity nerve conduction studies in 2014 of a neuropathy.   The patient will need a clinical evaluation, I do not see that she has an appointment set up in our office.  We will try to get a visit set up for her to evaluate her gait disorder.

## 2020-08-18 ENCOUNTER — Encounter: Payer: Self-pay | Admitting: Family Medicine

## 2020-08-18 ENCOUNTER — Ambulatory Visit (INDEPENDENT_AMBULATORY_CARE_PROVIDER_SITE_OTHER): Payer: Medicare Other

## 2020-08-18 ENCOUNTER — Other Ambulatory Visit: Payer: Self-pay

## 2020-08-18 ENCOUNTER — Ambulatory Visit (INDEPENDENT_AMBULATORY_CARE_PROVIDER_SITE_OTHER): Payer: Medicare Other | Admitting: Family Medicine

## 2020-08-18 VITALS — BP 118/67 | HR 76 | Temp 98.5°F | Ht 69.5 in | Wt 162.1 lb

## 2020-08-18 DIAGNOSIS — S92001A Unspecified fracture of right calcaneus, initial encounter for closed fracture: Secondary | ICD-10-CM

## 2020-08-18 DIAGNOSIS — M25571 Pain in right ankle and joints of right foot: Secondary | ICD-10-CM

## 2020-08-18 DIAGNOSIS — S92214A Nondisplaced fracture of cuboid bone of right foot, initial encounter for closed fracture: Secondary | ICD-10-CM | POA: Diagnosis not present

## 2020-08-18 DIAGNOSIS — W19XXXA Unspecified fall, initial encounter: Secondary | ICD-10-CM | POA: Diagnosis not present

## 2020-08-18 DIAGNOSIS — S92351A Displaced fracture of fifth metatarsal bone, right foot, initial encounter for closed fracture: Secondary | ICD-10-CM | POA: Diagnosis not present

## 2020-08-18 DIAGNOSIS — M79671 Pain in right foot: Secondary | ICD-10-CM

## 2020-08-18 DIAGNOSIS — S92354A Nondisplaced fracture of fifth metatarsal bone, right foot, initial encounter for closed fracture: Secondary | ICD-10-CM | POA: Diagnosis not present

## 2020-08-18 NOTE — Progress Notes (Signed)
Acute Office Visit  Subjective:    Patient ID: [name], female    DOB: [birth year], 80 y.o.   MRN: [id number]  Chief Complaint  Patient presents with   Fall    HPI Patient is in today for a fall that occurred 8 days ago. She lost her balance and fell and landed on her right side and rolled onto her right foot. She has had pain, swelling, and bruising in her top of her right foot and along the lateral side since then. She reports that the pain has been getting worse. The pain is intermittent. It feels achy. The pain is moderate. Weight bearing makes the pain worse. She has been elevating her foot. She reports increased tingling in this foot. She denies numbness. She reports decreased ROM due to the pain. She has been taking tylenol and motrin for the pain. She has screws in her right foot from a previous surgery.   Past Medical History:  Diagnosis Date   Anal or rectal pain    Anxiety state, unspecified    Arthritis    Arthropathy, unspecified, site unspecified    Carpal tunnel syndrome    Bilateral   Cataract    Chronic airway obstruction, not elsewhere classified    patient denies on 02/27/14   Esophageal reflux    Esophageal stricture    Hypertension    Internal hemorrhoids without mention of complication    Malignant neoplasm of breast (female), unspecified site    left breast    Memory disorder 09/24/2013   Nocturnal leg cramps 06/29/2018   Osteopenia    Other and unspecified hyperlipidemia    Pain in joint, multiple sites    Personal history of colonic polyps    adenomatous   Polyneuropathy in other diseases classified elsewhere ([facility]) 09/24/2013   Shingles    Sleep apnea    no longer uses cpap   Tubular adenoma of colon    Unspecified hypothyroidism     Past Surgical History:  Procedure Laterality Date   APPENDECTOMY  03/2014   BACK SURGERY     X2   BREAST LUMPECTOMY     COLONOSCOPY  08/03/2018   EYE SURGERY     cataracts bilateral   LAPAROSCOPIC  APPENDECTOMY N/A 02/28/2014   Procedure: APPENDECTOMY LAPAROSCOPIC PARTIAL CECECTOMY FOR MUCOCELE OF APPENDIX ;  Surgeon: Michael Boston, MD;  Location: [facility];  Service: General;  Laterality: N/A;   MASTECTOMY     left   THYROIDECTOMY     TUBAL LIGATION      Family History  Problem Relation Age of Onset   Bipolar disorder Sister    Early death Mother    Early death Sister    Colon cancer Neg Hx    Esophageal cancer Neg Hx    Rectal cancer Neg Hx    Stomach cancer Neg Hx     Social History   Socioeconomic History   Marital status: Divorced    Spouse name: Not on file   Number of children: 2   Years of education: Not on file   Highest education level: Not on file  Occupational History   Occupation: Retired    Fish farm manager: RETIRED  Tobacco Use   Smoking status: Never   Smokeless tobacco: Never  Vaping Use   Vaping Use: Never used  Substance and Sexual Activity   Alcohol use: No    Alcohol/week: 0.0 standard drinks   Drug use: No   Sexual activity: Never  Birth control/protection: Post-menopausal  Other Topics Concern   Not on file  Social History Narrative   Patient lives at home alone and she is divorced.   Retired.   Education business course.   Right handed.   Caffeine sometimes tea.    Social Determinants of Health   Financial Resource Strain: Not on file  Food Insecurity: Not on file  Transportation Needs: Not on file  Physical Activity: Not on file  Stress: Not on file  Social Connections: Not on file  Intimate Partner Violence: Not on file    Outpatient Medications Prior to Visit  Medication Sig Dispense Refill   albuterol (VENTOLIN HFA) 108 (90 Base) MCG/ACT inhaler Inhale 2 puffs into the lungs every 6 (six) hours as needed for wheezing or shortness of breath. 8 g 0   aspirin 81 MG tablet Take 81 mg by mouth daily.     busPIRone (BUSPAR) 30 MG tablet Take 0.5-1 tablets (15-30 mg total) by mouth daily as needed. TAKE (1/2) TO (1) TABLET BY MOUTH ONCE  DAILY AS NEEDED 90 tablet 3   Calcium Carbonate-Vitamin D 500-125 MG-UNIT TABS Take 1 capsule by mouth daily.     Cyanocobalamin (VITAMIN B 12 PO) Take 1 tablet by mouth daily.      Diclofenac Sodium 3 % GEL Apply 1 g topically 2 (two) times daily as needed. 60 g 3   dimenhyDRINATE (DRAMAMINE) 50 MG tablet Take 50 mg by mouth every 8 (eight) hours as needed.     ezetimibe (ZETIA) 10 MG tablet Take 1 tablet (10 mg total) by mouth daily. 90 tablet 3   fluticasone (FLONASE) 50 MCG/ACT nasal spray Place 2 sprays into both nostrils daily. 16 g 6   gabapentin (NEURONTIN) 300 MG capsule TAKE 1 CAPSULE EACH MORNING AND 2 AT BEDTIME. 270 capsule 3   hydrochlorothiazide (HYDRODIURIL) 12.5 MG tablet Take 1 tablet (12.5 mg total) by mouth daily. 90 tablet 3   ibuprofen (ADVIL,MOTRIN) 200 MG tablet Take 800 mg by mouth every 6 (six) hours as needed (Pain).      levothyroxine (SYNTHROID) 125 MCG tablet TAKE ONE TABLET ONCE DAILY 90 tablet 0   lisinopril (ZESTRIL) 2.5 MG tablet Take 1 tablet (2.5 mg total) by mouth daily. 90 tablet 3   memantine (NAMENDA) 5 MG tablet Take 1 tablet (5 mg total) by mouth 2 (two) times daily. 180 tablet 3   Multiple Vitamin (MULTIVITAMIN) capsule Take 1 capsule by mouth daily.     Omega-3 Fatty Acids (FISH OIL) 1000 MG CAPS Take 1,000 mg by mouth daily.     triamcinolone ointment (KENALOG) 0.5 % Apply 1 application topically 2 (two) times daily. 60 g 1   benzonatate (TESSALON) 200 MG capsule Take 1 capsule (200 mg total) by mouth 3 (three) times daily as needed. 30 capsule 1   No facility-administered medications prior to visit.    Allergies  Allergen Reactions   Atorvastatin Other (See Comments)    Muscle cramps   Meperidine Hcl Nausea And Vomiting    Makes me 'deathly' sick   Statins Other (See Comments)    REACTION: Patient cannot tolerate due cramps   Norco [Hydrocodone-Acetaminophen] Nausea And Vomiting   Sulfonamide Derivatives Other (See Comments)    Unknown  reaction    Review of Systems As per HPI.     Objective:    Physical Exam Vitals and nursing note reviewed.  Constitutional:      General: She is not in acute distress.  Appearance: She is not ill-appearing, toxic-appearing or diaphoretic.  Musculoskeletal:     Right ankle: Swelling present. No ecchymosis. No tenderness. Normal range of motion. Anterior drawer test negative.     Right Achilles Tendon: Normal.     Right foot: Decreased range of motion. Normal capillary refill. Swelling and tenderness (dorsal and lateral) present. Normal pulse.  Neurological:     Mental Status: She is alert and oriented to person, place, and time.  Psychiatric:        Mood and Affect: Mood normal.        Behavior: Behavior normal.    BP 118/67   Pulse 76   Temp 98.5 F (36.9 C) (Temporal)   Ht 5' 9.5" (1.765 m)   Wt 162 lb 2 oz (73.5 kg)   LMP  (LMP Unknown)   BMI 23.60 kg/m  Wt Readings from Last 3 Encounters:  08/18/20 162 lb 2 oz (73.5 kg)  07/29/20 160 lb (72.6 kg)  04/11/20 163 lb (73.9 kg)    Health Maintenance Due  Topic Date Due   Zoster Vaccines- Shingrix (1 of 2) Never done   COVID-19 Vaccine (3 - Moderna risk series) 08/16/2019   INFLUENZA VACCINE  08/04/2020    There are no preventive care reminders to display for this patient.   Lab Results  Component Value Date   TSH 7.180 (H) 04/11/2020   Lab Results  Component Value Date   WBC 5.4 04/11/2020   HGB 13.1 04/11/2020   HCT 39.3 04/11/2020   MCV 91 04/11/2020   PLT 229 04/11/2020   Lab Results  Component Value Date   NA 141 04/11/2020   K 4.5 04/11/2020   CHLORIDE 109 04/23/2013   CO2 22 04/11/2020   GLUCOSE 90 04/11/2020   BUN 19 04/11/2020   CREATININE 0.75 04/11/2020   BILITOT 0.6 04/11/2020   ALKPHOS 57 04/11/2020   AST 25 04/11/2020   ALT 19 04/11/2020   PROT 6.3 04/11/2020   ALBUMIN 4.5 04/11/2020   CALCIUM 9.2 04/11/2020   ANIONGAP 12 07/02/2019   EGFR 81 04/11/2020   Lab Results   Component Value Date   CHOL 263 (H) 04/11/2020   Lab Results  Component Value Date   HDL 55 04/11/2020   Lab Results  Component Value Date   LDLCALC 195 (H) 04/11/2020   Lab Results  Component Value Date   TRIG 79 04/11/2020   Lab Results  Component Value Date   CHOLHDL 4.8 (H) 04/11/2020   Lab Results  Component Value Date   HGBA1C 5.2 09/28/2013       Assessment & Plan:   Jeris was seen today for fall.  Diagnoses and all orders for this visit:  Fall, initial encounter Right foot pain Acute right ankle pain Xrays today in office. Acute avulsion fracture of lateral distal calcaneous, proximal cuboid, and base of 5th metatarsal per radiology report. CAM boot applied today in office. Can continue tylenol and ibuprofen. Referral to ortho placed.  -     DG Ankle Complete Right; Future -     DG Foot Complete Right; Future  Closed nondisplaced fracture of right calcaneus, unspecified portion of calcaneus, initial encounter -     Ambulatory referral to Orthopedic Surgery  Closed nondisplaced fracture of cuboid of right foot, initial encounter -     Ambulatory referral to Orthopedic Surgery  Closed nondisplaced fracture of fifth metatarsal bone of right foot, initial encounter -     Ambulatory referral to Orthopedic Surgery  Return to office for new or worsening symptoms, or if symptoms persist.   The patient indicates understanding of these issues and agrees with the plan.  Gwenlyn Perking, FNP

## 2020-08-18 NOTE — Patient Instructions (Signed)
Ankle Pain The ankle joint holds your body weight and allows you to move around. Ankle pain can occur on either side or the back of one ankle or both ankles. Ankle pain may be sharp and burning or dull and aching. There may be tenderness, stiffness, redness, or warmth around the ankle. Many things can cause anklepain, including an injury to the area and overuse of the ankle. Follow these instructions at home: Activity Rest your ankle as told by your health care provider. Avoid any activities that cause ankle pain. Do not use the injured limb to support your body weight until your health care provider says that you can. Use crutches as told by your health care provider. Do exercises as told by your health care provider. Ask your health care provider when it is safe to drive if you have a brace on your ankle. If you have a brace: Wear the brace as told by your health care provider. Remove it only as told by your health care provider. Loosen the brace if your toes tingle, become numb, or turn cold and blue. Keep the brace clean. If the brace is not waterproof: Do not let it get wet. Cover it with a watertight covering when you take a bath or shower. If you were given an elastic bandage:  Remove it when you take a bath or a shower. Try not to move your ankle very much, but wiggle your toes from time to time. This helps to prevent swelling. Adjust the bandage to make it more comfortable if it feels too tight. Loosen the bandage if you have numbness or tingling in your foot or if your foot turns cold and blue.  Managing pain, stiffness, and swelling  If directed, put ice on the painful area. If you have a removable brace or elastic bandage, remove it as told by your health care provider. Put ice in a plastic bag. Place a towel between your skin and the bag. Leave the ice on for 20 minutes, 2-3 times a day. Move your toes often to avoid stiffness and to lessen swelling. Raise (elevate) your  ankle above the level of your heart while you are sitting or lying down.  General instructions Record information about your pain. Writing down the following may be helpful for you and your health care provider: How often you have ankle pain. Where the pain is located. What the pain feels like. If treatment involves wearing a prescribed shoe or insole, make sure you wear it correctly and for as long as told by your health care provider. Take over-the-counter and prescription medicines only as told by your health care provider. Keep all follow-up visits as told by your health care provider. This is important. Contact a health care provider if: Your pain gets worse. Your pain is not relieved with medicines. You have a fever or chills. You are having more trouble with walking. You have new symptoms. Get help right away if: Your foot, leg, toes, or ankle: Tingles or becomes numb. Becomes swollen. Turns pale or blue. Summary Ankle pain can occur on either side or the back of one ankle or both ankles. Ankle pain may be sharp and burning or dull and aching. Rest your ankle as told by your health care provider. If told, apply ice to the area. Take over-the-counter and prescription medicines only as told by your health care provider. This information is not intended to replace advice given to you by your health care provider. Make sure you  discuss any questions you have with your healthcare provider. Document Revised: 04/11/2018 Document Reviewed: 06/29/2017 Elsevier Patient Education  [place].

## 2020-08-21 DIAGNOSIS — S99921A Unspecified injury of right foot, initial encounter: Secondary | ICD-10-CM | POA: Diagnosis not present

## 2020-08-21 DIAGNOSIS — S92354A Nondisplaced fracture of fifth metatarsal bone, right foot, initial encounter for closed fracture: Secondary | ICD-10-CM | POA: Diagnosis not present

## 2020-08-25 NOTE — Telephone Encounter (Signed)
I called the pt and we have scheduled her for 10/29/2020 at 1200 pm with Dr. Jannifer Franklin

## 2020-08-25 NOTE — Telephone Encounter (Signed)
Pt called stating she fell and broke her foot, unable to make her appt on 08/27/2020. Pt would like to be called for another appt with Dr. Jannifer Franklin before he leaves. Pt requesting a call back.

## 2020-08-27 ENCOUNTER — Ambulatory Visit: Payer: Self-pay | Admitting: Neurology

## 2020-10-02 DIAGNOSIS — Z1283 Encounter for screening for malignant neoplasm of skin: Secondary | ICD-10-CM | POA: Diagnosis not present

## 2020-10-02 DIAGNOSIS — I872 Venous insufficiency (chronic) (peripheral): Secondary | ICD-10-CM | POA: Diagnosis not present

## 2020-10-02 DIAGNOSIS — R208 Other disturbances of skin sensation: Secondary | ICD-10-CM | POA: Diagnosis not present

## 2020-10-06 DIAGNOSIS — S99921A Unspecified injury of right foot, initial encounter: Secondary | ICD-10-CM | POA: Diagnosis not present

## 2020-10-06 DIAGNOSIS — M25552 Pain in left hip: Secondary | ICD-10-CM | POA: Diagnosis not present

## 2020-10-10 ENCOUNTER — Other Ambulatory Visit: Payer: Self-pay | Admitting: Neurology

## 2020-10-10 ENCOUNTER — Other Ambulatory Visit: Payer: Self-pay | Admitting: Family Medicine

## 2020-10-13 ENCOUNTER — Ambulatory Visit (INDEPENDENT_AMBULATORY_CARE_PROVIDER_SITE_OTHER): Payer: Medicare Other | Admitting: Family Medicine

## 2020-10-13 ENCOUNTER — Encounter: Payer: Self-pay | Admitting: Family Medicine

## 2020-10-13 ENCOUNTER — Other Ambulatory Visit: Payer: Self-pay

## 2020-10-13 VITALS — BP 128/72 | HR 64 | Ht 69.5 in | Wt 165.0 lb

## 2020-10-13 DIAGNOSIS — E7800 Pure hypercholesterolemia, unspecified: Secondary | ICD-10-CM | POA: Diagnosis not present

## 2020-10-13 DIAGNOSIS — R413 Other amnesia: Secondary | ICD-10-CM | POA: Diagnosis not present

## 2020-10-13 DIAGNOSIS — Z23 Encounter for immunization: Secondary | ICD-10-CM | POA: Diagnosis not present

## 2020-10-13 DIAGNOSIS — E890 Postprocedural hypothyroidism: Secondary | ICD-10-CM | POA: Diagnosis not present

## 2020-10-13 DIAGNOSIS — N3946 Mixed incontinence: Secondary | ICD-10-CM

## 2020-10-13 DIAGNOSIS — K219 Gastro-esophageal reflux disease without esophagitis: Secondary | ICD-10-CM | POA: Diagnosis not present

## 2020-10-13 DIAGNOSIS — I10 Essential (primary) hypertension: Secondary | ICD-10-CM

## 2020-10-13 MED ORDER — LEVOTHYROXINE SODIUM 125 MCG PO TABS: 125.0000 ug | ORAL_TABLET | Freq: Every day | ORAL | 3 refills | Status: DC

## 2020-10-13 NOTE — Progress Notes (Signed)
BP 128/72   Pulse 64   Ht 5' 9.5" (1.765 m)   Wt 165 lb (74.8 kg)   LMP  (LMP Unknown)   SpO2 96%   BMI 24.02 kg/m    Subjective:   Patient ID: [name], female    DOB: April 12, 1940, 80 y.o.   MRN: [id number]  HPI: [name] is a 80 y.o. female presenting on 10/13/2020 for Medical Management of Chronic Issues and Hypothyroidism   HPI Hypothyroidism recheck Patient is coming in for thyroid recheck today as well. They deny any issues with hair changes or heat or cold problems or diarrhea or constipation. They deny any chest pain or palpitations. They are currently on levothyroxine 125 micrograms   Hypertension Patient is currently on hydrochlorothiazide and lisinopril, and their blood pressure today is 128/72. Patient denies any lightheadedness or dizziness. Patient denies headaches, blurred vision, chest pains, shortness of breath, or weakness. Denies any side effects from medication and is content with current medication.   Hyperlipidemia Patient is coming in for recheck of his hyperlipidemia. The patient is currently taking Zetia and fish oils. They deny any issues with myalgias or history of liver damage from it. They deny any focal numbness or weakness or chest pain.   GERD Patient is currently on no medication currently and denies issues.  She denies any major symptoms or abdominal pain or belching or burping. She denies any blood in her stool or lightheadedness or dizziness.   Memory and cognitive impairment. Patient has been on Namenda for memory.  She feels like it does okay but hard to tell if it is making a difference.  She is here on her own so there is nobody else to ask.  Patient is still having urinary frequency and urgency all of the time.  Patient wants a handicap placard because she is less and more off balance than she has been.  She does not want to do physical therapy to help with that.  Relevant past medical, surgical, family and social history reviewed  and updated as indicated. Interim medical history since our last visit reviewed. Allergies and medications reviewed and updated.  Review of Systems  Constitutional:  Negative for chills and fever.  HENT:  Negative for congestion, ear discharge and ear pain.   Eyes:  Negative for visual disturbance.  Respiratory:  Negative for chest tightness and shortness of breath.   Cardiovascular:  Negative for chest pain and leg swelling.  Genitourinary:  Positive for frequency and urgency. Negative for difficulty urinating, dysuria, flank pain, hematuria, pelvic pain, vaginal bleeding, vaginal discharge and vaginal pain.  Musculoskeletal:  Negative for back pain and gait problem.  Skin:  Negative for rash.  Neurological:  Negative for dizziness, light-headedness and headaches.  Psychiatric/Behavioral:  Negative for agitation and behavioral problems.   All other systems reviewed and are negative.  Per HPI unless specifically indicated above   Allergies as of 10/13/2020       Reactions   Atorvastatin Other (See Comments)   Muscle cramps   Meperidine Hcl Nausea And Vomiting   Makes me 'deathly' sick   Statins Other (See Comments)   REACTION: Patient cannot tolerate due cramps   Norco [hydrocodone-acetaminophen] Nausea And Vomiting   Sulfonamide Derivatives Other (See Comments)   Unknown reaction        Medication List        Accurate as of October 13, 2020  9:15 AM. If you have any questions, ask your nurse or doctor.  albuterol 108 (90 Base) MCG/ACT inhaler Commonly known as: VENTOLIN HFA Inhale 2 puffs into the lungs every 6 (six) hours as needed for wheezing or shortness of breath.   aspirin 81 MG tablet Take 81 mg by mouth daily.   busPIRone 30 MG tablet Commonly known as: BUSPAR Take 0.5-1 tablets (15-30 mg total) by mouth daily as needed. TAKE (1/2) TO (1) TABLET BY MOUTH ONCE DAILY AS NEEDED   Calcium Carbonate-Vitamin D 500-125 MG-UNIT Tabs Take 1 capsule by  mouth daily.   Diclofenac Sodium 3 % Gel Apply 1 g topically 2 (two) times daily as needed.   dimenhyDRINATE 50 MG tablet Commonly known as: DRAMAMINE Take 50 mg by mouth every 8 (eight) hours as needed.   ezetimibe 10 MG tablet Commonly known as: ZETIA Take 1 tablet (10 mg total) by mouth daily.   Fish Oil 1000 MG Caps Take 1,000 mg by mouth daily.   fluticasone 50 MCG/ACT nasal spray Commonly known as: FLONASE Place 2 sprays into both nostrils daily.   gabapentin 300 MG capsule Commonly known as: NEURONTIN TAKE 1 CAPSULE EACH MORNING AND 2 AT BEDTIME.   hydrochlorothiazide 12.5 MG tablet Commonly known as: HYDRODIURIL Take 1 tablet (12.5 mg total) by mouth daily.   ibuprofen 200 MG tablet Commonly known as: ADVIL Take 800 mg by mouth every 6 (six) hours as needed (Pain).   levothyroxine 125 MCG tablet Commonly known as: SYNTHROID Take 1 tablet (125 mcg total) by mouth daily.   lisinopril 2.5 MG tablet Commonly known as: Zestril Take 1 tablet (2.5 mg total) by mouth daily.   memantine 5 MG tablet Commonly known as: NAMENDA Take 1 tablet (5 mg total) by mouth 2 (two) times daily.   multivitamin capsule Take 1 capsule by mouth daily.   triamcinolone ointment 0.5 % Commonly known as: KENALOG Apply 1 application topically 2 (two) times daily.   VITAMIN B 12 PO Take 1 tablet by mouth daily.         Objective:   BP 128/72   Pulse 64   Ht 5' 9.5" (1.765 m)   Wt 165 lb (74.8 kg)   LMP  (LMP Unknown)   SpO2 96%   BMI 24.02 kg/m   Wt Readings from Last 3 Encounters:  10/13/20 165 lb (74.8 kg)  08/18/20 162 lb 2 oz (73.5 kg)  07/29/20 160 lb (72.6 kg)    Physical Exam Vitals and nursing note reviewed.  Constitutional:      General: She is not in acute distress.    Appearance: She is well-developed. She is not diaphoretic.  Eyes:     Conjunctiva/sclera: Conjunctivae normal.  Cardiovascular:     Rate and Rhythm: Normal rate and regular rhythm.      Heart sounds: Normal heart sounds. No murmur heard. Pulmonary:     Effort: Pulmonary effort is normal. No respiratory distress.     Breath sounds: Normal breath sounds. No wheezing.  Musculoskeletal:        General: No tenderness. Normal range of motion.  Skin:    General: Skin is warm and dry.     Findings: No rash.  Neurological:     Mental Status: She is alert and oriented to person, place, and time.     Coordination: Coordination normal.  Psychiatric:        Behavior: Behavior normal.      Assessment & Plan:   Problem List Items Addressed This Visit       Cardiovascular and  Mediastinum   HTN (hypertension)   Relevant Orders   CMP14+EGFR     Digestive   GERD (gastroesophageal reflux disease)   Relevant Orders   CBC with Differential/Platelet     Endocrine   Hypothyroidism, postsurgical - Primary   Relevant Medications   levothyroxine (SYNTHROID) 125 MCG tablet   Other Relevant Orders   TSH     Other   Memory disorder (Chronic)   Hyperlipidemia   Relevant Orders   Lipid panel   Other Visit Diagnoses     Mixed stress and urge urinary incontinence           Will recheck blood work, seems to be stable on current medicine except for urinary issues.  She already did see a urologist and was not satisfied with the response.  We will give her samples of Myrbetriq and see if it helps. Follow up plan: Return in about 6 months (around 04/13/2021), or if symptoms worsen or fail to improve, for Hypertension and hypothyroidism.  Counseling provided for all of the vaccine components Orders Placed This Encounter  Procedures   CBC with Differential/Platelet   CMP14+EGFR   Lipid panel   TSH    Caryl Pina, MD [facility] 10/13/2020, 9:15 AM

## 2020-10-14 LAB — CMP14+EGFR
ALT: 15 IU/L (ref 0–32)
AST: 23 IU/L (ref 0–40)
Albumin/Globulin Ratio: 2.5 — ABNORMAL HIGH (ref 1.2–2.2)
Albumin: 4.8 g/dL — ABNORMAL HIGH (ref 3.7–4.7)
Alkaline Phosphatase: 64 IU/L (ref 44–121)
BUN/Creatinine Ratio: 20 (ref 12–28)
BUN: 13 mg/dL (ref 8–27)
Bilirubin Total: 0.5 mg/dL (ref 0.0–1.2)
CO2: 20 mmol/L (ref 20–29)
Calcium: 9.1 mg/dL (ref 8.7–10.3)
Chloride: 104 mmol/L (ref 96–106)
Creatinine, Ser: 0.65 mg/dL (ref 0.57–1.00)
Globulin, Total: 1.9 g/dL (ref 1.5–4.5)
Glucose: 86 mg/dL (ref 70–99)
Potassium: 4.3 mmol/L (ref 3.5–5.2)
Sodium: 142 mmol/L (ref 134–144)
Total Protein: 6.7 g/dL (ref 6.0–8.5)
eGFR: 90 mL/min/{1.73_m2} (ref >59)

## 2020-10-14 LAB — CBC WITH DIFFERENTIAL/PLATELET
Basophils Absolute: 0 10*3/uL (ref 0.0–0.2)
Basos: 1 %
EOS (ABSOLUTE): 0.2 10*3/uL (ref 0.0–0.4)
Eos: 5 %
Hematocrit: 39.5 % (ref 34.0–46.6)
Hemoglobin: 13.5 g/dL (ref 11.1–15.9)
Immature Grans (Abs): 0 10*3/uL (ref 0.0–0.1)
Immature Granulocytes: 0 %
Lymphocytes Absolute: 1.7 10*3/uL (ref 0.7–3.1)
Lymphs: 39 %
MCH: 31.3 pg (ref 26.6–33.0)
MCHC: 34.2 g/dL (ref 31.5–35.7)
MCV: 92 fL (ref 79–97)
Monocytes Absolute: 0.3 10*3/uL (ref 0.1–0.9)
Monocytes: 7 %
Neutrophils Absolute: 2.1 10*3/uL (ref 1.4–7.0)
Neutrophils: 48 %
Platelets: 195 10*3/uL (ref 150–450)
RBC: 4.31 x10E6/uL (ref 3.77–5.28)
RDW: 13.6 % (ref 11.7–15.4)
WBC: 4.3 10*3/uL (ref 3.4–10.8)

## 2020-10-14 LAB — LIPID PANEL
Chol/HDL Ratio: 4.8 ratio — ABNORMAL HIGH (ref 0.0–4.4)
Cholesterol, Total: 247 mg/dL — ABNORMAL HIGH (ref 100–199)
HDL: 52 mg/dL (ref >39)
LDL Chol Calc (NIH): 175 mg/dL — ABNORMAL HIGH (ref 0–99)
Triglycerides: 110 mg/dL (ref 0–149)
VLDL Cholesterol Cal: 20 mg/dL (ref 5–40)

## 2020-10-14 LAB — TSH: TSH: 8.15 u[IU]/mL — ABNORMAL HIGH (ref 0.450–4.500)

## 2020-10-16 ENCOUNTER — Encounter: Payer: Self-pay | Admitting: Family

## 2020-10-16 ENCOUNTER — Ambulatory Visit (INDEPENDENT_AMBULATORY_CARE_PROVIDER_SITE_OTHER): Payer: Medicare Other | Admitting: Family

## 2020-10-16 DIAGNOSIS — R399 Unspecified symptoms and signs involving the genitourinary system: Secondary | ICD-10-CM

## 2020-10-16 MED ORDER — CEPHALEXIN 500 MG PO CAPS: 500.0000 mg | ORAL_CAPSULE | Freq: Two times a day (BID) | ORAL | 0 refills | Status: DC

## 2020-10-16 NOTE — Progress Notes (Signed)
Virtual Visit  Note Due to COVID-19 pandemic this visit was conducted virtually. This visit type was conducted due to national recommendations for restrictions regarding the COVID-19 Pandemic (e.g. social distancing, sheltering in place) in an effort to limit this patient's exposure and mitigate transmission in our community. All issues noted in this document were discussed and addressed.  A physical exam was not performed with this format.  I connected with [name] on 10/16/20 at 3:34 pm  by telephone and verified that I am speaking with the correct person using two identifiers. [name] is currently located at home and no one is currently with her  during visit. The provider, [name], FNP is located in their office at time of visit.  I discussed the limitations, risks, security and privacy concerns of performing an evaluation and management service by telephone and the availability of in person appointments. I also discussed with the patient that there may be a patient responsible charge related to this service. The patient expressed understanding and agreed to proceed.  [name], Spence are scheduled for a virtual visit with your provider today.    Just as we do with appointments in the office, we must obtain your consent to participate.  Your consent will be active for this visit and any virtual visit you may have with one of our providers in the next 365 days.    If you have a MyChart account, I can also send a copy of this consent to you electronically.  All virtual visits are billed to your insurance company just like a traditional visit in the office.  As this is a virtual visit, video technology does not allow for your provider to perform a traditional examination.  This may limit your provider's ability to fully assess your condition.  If your provider identifies any concerns that need to be evaluated in person or the need to arrange testing such as labs, EKG, etc, we will make  arrangements to do so.    Although advances in technology are sophisticated, we cannot ensure that it will always work on either your end or our end.  If the connection with a video visit is poor, we may have to switch to a telephone visit.  With either a video or telephone visit, we are not always able to ensure that we have a secure connection.   I need to obtain your verbal consent now.   Are you willing to proceed with your visit today?   Mackensie Pilson has provided verbal consent on 10/16/2020 for a virtual visit (video or telephone).   [name], [place] 10/16/2020  3:35 PM    History and Present Illness:  Dysuria  This is a new problem. The current episode started 1 to 4 weeks ago. The problem occurs intermittently. The quality of the pain is described as burning. The pain is at a severity of 9/10. There has been no fever. Associated symptoms include frequency, hesitancy and urgency. Pertinent negatives include no hematuria, nausea or vomiting. She has tried increased fluids for the symptoms. The treatment provided mild relief.     Review of Systems  Gastrointestinal:  Negative for nausea and vomiting.  Genitourinary:  Positive for dysuria, frequency, hesitancy and urgency. Negative for hematuria.    Observations/Objective: No SOB or distress noted   Assessment and Plan: 1. UTI symptoms Force fluids AZO over the counter X2 days RTO if symptoms worsen or do not improve  Culture pending - Urinalysis, Complete - Urine Culture -  cephALEXin (KEFLEX) 500 MG capsule; Take 1 capsule (500 mg total) by mouth 2 (two) times daily.  Dispense: 14 capsule; Refill: 0    I discussed the assessment and treatment plan with the patient. The patient was provided an opportunity to ask questions and all were answered. The patient agreed with the plan and demonstrated an understanding of the instructions.   The patient was advised to call back or seek an in-person evaluation if the symptoms  worsen or if the condition fails to improve as anticipated.  The above assessment and management plan was discussed with the patient. The patient verbalized understanding of and has agreed to the management plan. Patient is aware to call the clinic if symptoms persist or worsen. Patient is aware when to return to the clinic for a follow-up visit. Patient educated on when it is appropriate to go to the emergency department.   Time call ended:  3:45 pm   I provided 11 minutes of  non face-to-face time during this encounter.    [name], FNP

## 2020-10-17 LAB — MICROSCOPIC EXAMINATION
Epithelial Cells (non renal): NONE SEEN /hpf (ref 0–10)
Renal Epithel, UA: NONE SEEN /hpf
WBC, UA: >30 /hpf — AB (ref 0–5)

## 2020-10-17 LAB — URINALYSIS, COMPLETE
Bilirubin, UA: NEGATIVE
Glucose, UA: NEGATIVE
Nitrite, UA: POSITIVE — AB
Protein,UA: NEGATIVE
Specific Gravity, UA: 1.025 (ref 1.005–1.030)
Urobilinogen, Ur: 0.2 mg/dL (ref 0.2–1.0)
pH, UA: 5 (ref 5.0–7.5)

## 2020-10-20 LAB — URINE CULTURE

## 2020-10-22 ENCOUNTER — Other Ambulatory Visit: Payer: Self-pay

## 2020-10-22 DIAGNOSIS — E7800 Pure hypercholesterolemia, unspecified: Secondary | ICD-10-CM

## 2020-10-22 DIAGNOSIS — I10 Essential (primary) hypertension: Secondary | ICD-10-CM

## 2020-10-29 ENCOUNTER — Ambulatory Visit: Payer: Medicare Other | Admitting: Neurology

## 2020-10-29 ENCOUNTER — Other Ambulatory Visit: Payer: Self-pay

## 2020-10-29 ENCOUNTER — Encounter: Payer: Self-pay | Admitting: Neurology

## 2020-10-29 VITALS — BP 131/71 | HR 57 | Ht 69.0 in | Wt 161.0 lb

## 2020-10-29 DIAGNOSIS — G500 Trigeminal neuralgia: Secondary | ICD-10-CM | POA: Diagnosis not present

## 2020-10-29 DIAGNOSIS — R269 Unspecified abnormalities of gait and mobility: Secondary | ICD-10-CM | POA: Diagnosis not present

## 2020-10-29 DIAGNOSIS — R413 Other amnesia: Secondary | ICD-10-CM | POA: Diagnosis not present

## 2020-10-29 NOTE — Progress Notes (Signed)
Reason for visit: Peripheral neuropathy, gait disturbance, mild cognitive impairment  [name] is an 80 y.o. female  History of present illness:  [name] is a 80 year old right-handed white female with a history of a mild memory disturbance.  She has been relatively stable with this, she is on Aricept and Namenda currently.  She comes in today with some gradual worsening problems with her balance over the last year.  She reports ongoing numbness of the toes.  She has a lot of leg cramps at night while sleeping, she will take mustard for this.  She has had several falls, she actually fractured her right foot several months ago.  She has a cane and a walker but does not use this consistently when she is outside of the house.  She claims she did undergo physical therapy without much benefit a year ago for her walking.  She does report chronic low back pain without radiation down the legs.  She does have some problems with urinary incontinence and is on medication for this with some benefit.  She has not given up any activities daily living because of memory, she continues to operate a motor vehicle.  She returns for an evaluation.  Past Medical History:  Diagnosis Date   Anal or rectal pain    Anxiety state, unspecified    Arthritis    Arthropathy, unspecified, site unspecified    Carpal tunnel syndrome    Bilateral   Cataract    Chronic airway obstruction, not elsewhere classified    patient denies on 02/27/14   Esophageal reflux    Esophageal stricture    Hypertension    Internal hemorrhoids without mention of complication    Malignant neoplasm of breast (female), unspecified site    left breast    Memory disorder 09/24/2013   Nocturnal leg cramps 06/29/2018   Osteopenia    Other and unspecified hyperlipidemia    Pain in joint, multiple sites    Personal history of colonic polyps    adenomatous   Polyneuropathy in other diseases classified elsewhere ([place]) 09/24/2013   Shingles     Sleep apnea    no longer uses cpap   Tubular adenoma of colon    Unspecified hypothyroidism     Past Surgical History:  Procedure Laterality Date   APPENDECTOMY  03/2014   BACK SURGERY     X2   BREAST LUMPECTOMY     COLONOSCOPY  08/03/2018   EYE SURGERY     cataracts bilateral   LAPAROSCOPIC APPENDECTOMY N/A 02/28/2014   Procedure: APPENDECTOMY LAPAROSCOPIC PARTIAL CECECTOMY FOR MUCOCELE OF APPENDIX ;  Surgeon: Michael Boston, MD;  Location: [facility];  Service: General;  Laterality: N/A;   MASTECTOMY     left   THYROIDECTOMY     TUBAL LIGATION      Family History  Problem Relation Age of Onset   Bipolar disorder Sister    Early death Mother    Early death Sister    Colon cancer Neg Hx    Esophageal cancer Neg Hx    Rectal cancer Neg Hx    Stomach cancer Neg Hx     Social history:  reports that she has never smoked. She has never used smokeless tobacco. She reports that she does not drink alcohol and does not use drugs.    Allergies  Allergen Reactions   Atorvastatin Other (See Comments)    Muscle cramps   Meperidine Hcl Nausea And Vomiting    Makes  me 'deathly' sick   Statins Other (See Comments)    REACTION: Patient cannot tolerate due cramps   Norco [Hydrocodone-Acetaminophen] Nausea And Vomiting   Sulfonamide Derivatives Other (See Comments)    Unknown reaction    Medications:  Prior to Admission medications   Medication Sig Start Date End Date Taking? Authorizing Provider  albuterol (VENTOLIN HFA) 108 (90 Base) MCG/ACT inhaler Inhale 2 puffs into the lungs every 6 (six) hours as needed for wheezing or shortness of breath. 08/04/20  Yes Hawks, Christy A, FNP  aspirin 81 MG tablet Take 81 mg by mouth daily.   Yes [provider]  busPIRone (BUSPAR) 30 MG tablet Take 0.5-1 tablets (15-30 mg total) by mouth daily as needed. TAKE (1/2) TO (1) TABLET BY MOUTH ONCE DAILY AS NEEDED 04/11/20  Yes Dettinger, Fransisca Kaufmann, MD  Calcium Carbonate-Vitamin D 500-125  MG-UNIT TABS Take 1 capsule by mouth daily.   Yes [provider]  cephALEXin (KEFLEX) 500 MG capsule Take 1 capsule (500 mg total) by mouth 2 (two) times daily. 10/16/20  Yes Hawks, Christy A, FNP  Cyanocobalamin (VITAMIN B 12 PO) Take 1 tablet by mouth daily.    Yes [provider]  Diclofenac Sodium 3 % GEL Apply 1 g topically 2 (two) times daily as needed. 08/21/19  Yes Kathrynn Ducking, MD  dimenhyDRINATE (DRAMAMINE) 50 MG tablet Take 50 mg by mouth every 8 (eight) hours as needed.   Yes [provider]  donepezil (ARICEPT) 10 MG tablet TAKE 1 TABLET BY MOUTH AT BEDTIME. 10/13/20  Yes Suzzanne Cloud, NP  ezetimibe (ZETIA) 10 MG tablet Take 1 tablet (10 mg total) by mouth daily. 04/11/20  Yes Dettinger, Fransisca Kaufmann, MD  fluticasone (FLONASE) 50 MCG/ACT nasal spray Place 2 sprays into both nostrils daily. 08/04/20  Yes Hawks, Alyse Low A, FNP  gabapentin (NEURONTIN) 300 MG capsule TAKE 1 CAPSULE EACH MORNING AND 2 AT BEDTIME. 10/13/20  Yes Suzzanne Cloud, NP  hydrochlorothiazide (HYDRODIURIL) 12.5 MG tablet Take 1 tablet (12.5 mg total) by mouth daily. 04/11/20  Yes Dettinger, Fransisca Kaufmann, MD  ibuprofen (ADVIL,MOTRIN) 200 MG tablet Take 800 mg by mouth every 6 (six) hours as needed (Pain).    Yes [provider]  levothyroxine (SYNTHROID) 125 MCG tablet Take 1 tablet (125 mcg total) by mouth daily. 10/13/20  Yes Dettinger, Fransisca Kaufmann, MD  lisinopril (ZESTRIL) 2.5 MG tablet Take 1 tablet (2.5 mg total) by mouth daily. 04/11/20  Yes Dettinger, Fransisca Kaufmann, MD  memantine (NAMENDA) 5 MG tablet Take 1 tablet (5 mg total) by mouth 2 (two) times daily. 04/11/20  Yes Dettinger, Fransisca Kaufmann, MD  Multiple Vitamin (MULTIVITAMIN) capsule Take 1 capsule by mouth daily.   Yes [provider]  Omega-3 Fatty Acids (FISH OIL) 1000 MG CAPS Take 1,000 mg by mouth daily.   Yes [provider]  triamcinolone ointment (KENALOG) 0.5 % Apply 1 application topically 2 (two) times daily. 07/29/20   Yes Hawks, Christy A, FNP    ROS:  Out of a complete 14 system review of symptoms, the patient complains only of the following symptoms, and all other reviewed systems are negative.  Walking difficulty Numbness in the feet Memory problems  Blood pressure 131/71, pulse (!) 57, height 5\' 9"  (1.753 m), weight 161 lb (73 kg).  Physical Exam  General: The patient is alert and cooperative at the time of the examination.  Skin: No significant peripheral edema is noted.   Neurologic Exam  Mental  status: The patient is alert and oriented x 3 at the time of the examination. The Mini-Mental status examination done today shows a total score of 28/30.   Cranial nerves: Facial symmetry is present. Speech is normal, no aphasia or dysarthria is noted. Extraocular movements are full. Visual fields are full.  Motor: The patient has good strength in all 4 extremities.  Sensory examination: Soft touch sensation is symmetric on the face, arms, and legs.  Coordination: The patient has good finger-nose-finger and heel-to-shin bilaterally.  Gait and station: The patient has a slightly wide-based gait, she can walk independently.  She does have some mild instability with turns.  Surprisingly, she is able to perform tandem gait.  Romberg is negative but is slightly unsteady.  Reflexes: Deep tendon reflexes are symmetric.   Assessment/Plan:  1.  Gait disturbance  2.  Peripheral neuropathy  3.  Mild cognitive impairment  The patient be set up for physical therapy again for her balance.  She will undergo blood work today.  She is on gabapentin taking a total of 900 mg a day.  Potentially, this dose may need to be lowered to improve balance.  She is on vitamin B12 supplementation.  She will follow-up here in 6 months, in the future she can be followed through Dr. [name].  Jill Alexanders MD 10/29/2020 11:56 AM  [facility] [address] [place], [place]  [address]  Phone [phone] Fax [phone]

## 2020-10-31 LAB — MULTIPLE MYELOMA PANEL, SERUM
Albumin SerPl Elph-Mcnc: 4.1 g/dL (ref 2.9–4.4)
Albumin/Glob SerPl: 1.6 (ref 0.7–1.7)
Alpha 1: 0.2 g/dL (ref 0.0–0.4)
Alpha2 Glob SerPl Elph-Mcnc: 0.6 g/dL (ref 0.4–1.0)
B-Globulin SerPl Elph-Mcnc: 1 g/dL (ref 0.7–1.3)
Gamma Glob SerPl Elph-Mcnc: 0.7 g/dL (ref 0.4–1.8)
Globulin, Total: 2.6 g/dL (ref 2.2–3.9)
IgA/Immunoglobulin A, Serum: 166 mg/dL (ref 64–422)
IgG (Immunoglobin G), Serum: 770 mg/dL (ref 586–1602)
IgM (Immunoglobulin M), Srm: 89 mg/dL (ref 26–217)
Total Protein: 6.7 g/dL (ref 6.0–8.5)

## 2020-10-31 LAB — COPPER, SERUM: Copper: 109 ug/dL (ref 80–158)

## 2020-10-31 LAB — RPR: RPR Ser Ql: NONREACTIVE

## 2020-11-06 ENCOUNTER — Telehealth: Payer: Self-pay

## 2020-11-06 NOTE — Telephone Encounter (Signed)
Pt returned phone, relayed previous message as instructed.

## 2020-11-06 NOTE — Telephone Encounter (Signed)
I called the pt and left vm asking for CB to review labs. Per results from 10/29/2020, labs were normal.  TE staff can relay if pt calls back.

## 2020-11-06 NOTE — Telephone Encounter (Signed)
Noted thank you

## 2020-11-17 ENCOUNTER — Telehealth: Payer: Self-pay | Admitting: Family Medicine

## 2020-11-17 ENCOUNTER — Other Ambulatory Visit: Payer: Self-pay | Admitting: Family Medicine

## 2020-11-17 DIAGNOSIS — N3281 Overactive bladder: Secondary | ICD-10-CM

## 2020-11-17 MED ORDER — MIRABEGRON ER 25 MG PO TB24: 25.0000 mg | ORAL_TABLET | Freq: Every day | ORAL | 3 refills | Status: DC

## 2020-11-17 NOTE — Progress Notes (Signed)
Patient aware and verbalized understanding. °

## 2020-11-17 NOTE — Telephone Encounter (Signed)
Pt called stating that Dr Dettinger gave her samples of Myrbetriq and told pt that if the medicine worked for her, to call and let him know and he would send in a script for it.  Pt says the medicine is working and would like for Dr Building control surveyor to go ahead and send Rx to [facility].

## 2020-11-17 NOTE — Progress Notes (Unsigned)
Sent Myrbetriq for the patient

## 2020-11-24 ENCOUNTER — Telehealth: Payer: Self-pay

## 2020-11-24 NOTE — Chronic Care Management (AMB) (Signed)
  Chronic Care Management   Note  11/24/2020 Name: [name] MRN: [id number] DOB: 1940-12-07  [name] is a 80 y.o. year old female who is a primary care patient of Dettinger, Fransisca Kaufmann, MD. I reached out to Thea Gist by phone today in response to a referral sent by [name] PCP.  [name] was given information about Chronic Care Management services today including:  CCM service includes personalized support from designated clinical staff supervised by her physician, including individualized plan of care and coordination with other care providers 24/7 contact phone numbers for assistance for urgent and routine care needs. Service will only be billed when office clinical staff spend 20 minutes or more in a month to coordinate care. Only one practitioner may furnish and bill the service in a calendar month. The patient may stop CCM services at any time (effective at the end of the month) by phone call to the office staff. The patient is responsible for co-pay (up to 20% after annual deductible is met) if co-pay is required by the individual health plan.   Patient agreed to services and verbal consent obtained.   Follow up plan: Telephone appointment with care management team member scheduled for: RN CM 12/05/2020 Pharm D 01/10/2020  Noreene Larsson, [facility], [place], [place] [address] Direct Dial: [phone] [email or web address]

## 2020-12-05 ENCOUNTER — Ambulatory Visit (INDEPENDENT_AMBULATORY_CARE_PROVIDER_SITE_OTHER): Payer: Medicare Other | Admitting: *Deleted

## 2020-12-05 DIAGNOSIS — N3281 Overactive bladder: Secondary | ICD-10-CM

## 2020-12-05 DIAGNOSIS — I10 Essential (primary) hypertension: Secondary | ICD-10-CM

## 2020-12-05 DIAGNOSIS — E890 Postprocedural hypothyroidism: Secondary | ICD-10-CM

## 2020-12-05 NOTE — Chronic Care Management (AMB) (Signed)
Chronic Care Management   CCM RN Visit Note  12/05/2020 Name: [name] MRN: [id number] DOB: February 22, 1940  Subjective: [name] is a 80 y.o. year old female who is a primary care patient of Dettinger, Fransisca Kaufmann, MD. The care management team was consulted for assistance with disease management and care coordination needs.    Engaged with patient by telephone for initial visit in response to provider referral for case management and/or care coordination services.   Consent to Services:  The patient was given the following information about Chronic Care Management services today, agreed to services, and gave verbal consent: 1. CCM service includes personalized support from designated clinical staff supervised by the primary care provider, including individualized plan of care and coordination with other care providers 2. 24/7 contact phone numbers for assistance for urgent and routine care needs. 3. Service will only be billed when office clinical staff spend 20 minutes or more in a month to coordinate care. 4. Only one practitioner may furnish and bill the service in a calendar month. 5.The patient may stop CCM services at any time (effective at the end of the month) by phone call to the office staff. 6. The patient will be responsible for cost sharing (co-pay) of up to 20% of the service fee (after annual deductible is met). Patient agreed to services and consent obtained.  Patient agreed to services and verbal consent obtained.   Assessment: Review of patient past medical history, allergies, medications, health status, including review of consultants reports, laboratory and other test data, was performed as part of comprehensive evaluation and provision of chronic care management services.   SDOH (Social Determinants of Health) assessments and interventions performed:  SDOH Interventions    Flowsheet Row Most Recent Value  SDOH Interventions   Financial Strain Interventions Other (Comment)   [assisted with Medicare Extra Help Application]  Social Connections Interventions Intervention Not Indicated        CCM Care Plan  Allergies  Allergen Reactions   Atorvastatin Other (See Comments)    Muscle cramps   Meperidine Hcl Nausea And Vomiting    Makes me 'deathly' sick   Statins Other (See Comments)    REACTION: Patient cannot tolerate due cramps   Norco [Hydrocodone-Acetaminophen] Nausea And Vomiting   Sulfonamide Derivatives Other (See Comments)    Unknown reaction    Outpatient Encounter Medications as of 12/05/2020  Medication Sig   albuterol (VENTOLIN HFA) 108 (90 Base) MCG/ACT inhaler Inhale 2 puffs into the lungs every 6 (six) hours as needed for wheezing or shortness of breath.   aspirin 81 MG tablet Take 81 mg by mouth daily.   busPIRone (BUSPAR) 30 MG tablet Take 0.5-1 tablets (15-30 mg total) by mouth daily as needed. TAKE (1/2) TO (1) TABLET BY MOUTH ONCE DAILY AS NEEDED   Calcium Carbonate-Vitamin D 500-125 MG-UNIT TABS Take 1 capsule by mouth daily.   cephALEXin (KEFLEX) 500 MG capsule Take 1 capsule (500 mg total) by mouth 2 (two) times daily.   Cyanocobalamin (VITAMIN B 12 PO) Take 1 tablet by mouth daily.    Diclofenac Sodium 3 % GEL Apply 1 g topically 2 (two) times daily as needed.   dimenhyDRINATE (DRAMAMINE) 50 MG tablet Take 50 mg by mouth every 8 (eight) hours as needed.   donepezil (ARICEPT) 10 MG tablet TAKE 1 TABLET BY MOUTH AT BEDTIME.   ezetimibe (ZETIA) 10 MG tablet Take 1 tablet (10 mg total) by mouth daily.   fluticasone (FLONASE) 50 MCG/ACT nasal spray  Place 2 sprays into both nostrils daily.   gabapentin (NEURONTIN) 300 MG capsule TAKE 1 CAPSULE EACH MORNING AND 2 AT BEDTIME.   hydrochlorothiazide (HYDRODIURIL) 12.5 MG tablet Take 1 tablet (12.5 mg total) by mouth daily.   ibuprofen (ADVIL,MOTRIN) 200 MG tablet Take 800 mg by mouth every 6 (six) hours as needed (Pain).    levothyroxine (SYNTHROID) 125 MCG tablet Take 1 tablet (125 mcg  total) by mouth daily.   lisinopril (ZESTRIL) 2.5 MG tablet Take 1 tablet (2.5 mg total) by mouth daily.   memantine (NAMENDA) 5 MG tablet Take 1 tablet (5 mg total) by mouth 2 (two) times daily.   mirabegron ER (MYRBETRIQ) 25 MG TB24 tablet Take 1 tablet (25 mg total) by mouth daily.   Multiple Vitamin (MULTIVITAMIN) capsule Take 1 capsule by mouth daily.   Omega-3 Fatty Acids (FISH OIL) 1000 MG CAPS Take 1,000 mg by mouth daily.   triamcinolone ointment (KENALOG) 0.5 % Apply 1 application topically 2 (two) times daily.   No facility-administered encounter medications on file as of 12/05/2020.    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 02/28/2014 02/28/2014   Polyneuropathy in other diseases classified elsewhere ([place]) 09/24/2013   Memory disorder 09/24/2013   Sleep apnea 05/15/2013   Hypothyroidism, postsurgical 05/15/2013   HTN (hypertension) 05/15/2013   Vitamin D deficiency 05/15/2013   Hyperlipidemia 05/15/2013   Osteopenia 10/25/2012   Trigeminal neuralgia 04/02/2011   GERD (gastroesophageal reflux disease) 07/23/2010   ANXIETY 05/24/2008   COPD (chronic obstructive pulmonary disease) ([place]) 05/24/2008   ARTHRITIS 05/24/2008   Idiopathic osteoporosis 05/24/2008   Malignant neoplasm of upper-outer quadrant of left female breast ([place]) 05/22/2008   COLONIC POLYPS, ADENOMATOUS, HX OF 05/22/2008    Conditions to be addressed/monitored:HTN, Anxiety, Hypothyroidism, Osteoarthritis, and overactive bladder, and prescription assistance needs  Care Plan : [facility] Care Plan  Updates made by Ilean China, RN since 12/05/2020 12:00 AM     Problem: Chronic Disease Management Needs   Priority: High  Onset Date: 12/05/2020     Long-Range Goal: Patient will Work with RN Care  Manager to Develop a Plan of Care Regarding Chronic Care Management and Care Coordination Associated with HTN, hypothyroidism, arthritis, anxiety, prescription assistance needs   Start Date: 12/05/2020  Expected End Date: 12/05/2021  This Visit's Progress: On track  Priority: High  Note:   Current Barriers:  Financial constraints related to cost of medications  RNCM Clinical Goal(s):  Patient will  complete applications for prescription assistance  through collaboration with RN Care manager, provider, and care team.  Patient will work with RN Care Manager regarding Care Coordination and Care Management associated with hypertension, overactive bladder, hypothyroidism  Interventions: 1:1 collaboration with primary care provider regarding development and update of comprehensive plan of care as evidenced by provider attestation and co-signature Inter-disciplinary care team collaboration (see longitudinal plan of care) Evaluation of current treatment plan related to  self management and patient's adherence to plan as established by provider Discussed family/social support Discussed mobility and ability to perform ADLs   SDOH Barriers (Status: New goal.) Short Term Goal  Patient interviewed and SDOH assessment performed        SDOH Interventions    Flowsheet Row Most Recent Value  SDOH Interventions   Financial Strain Interventions Other (  Comment)  [assisted with Medicare Extra Help Application]  Social Connections Interventions Intervention Not Indicated     Provided patient with information about Medicare Extra Help for Prescription Drug Coverage (LIS) and the limitations for Myrbetriq manufacturer assistance Discussed plans with patient for ongoing care management follow up and provided patient with direct contact information for care management team Advised patient to call RN Care Manager [phone] when she receives her decision letter from [place] regarding Extra  Help Assisted patient/caregiver with obtaining information about health plan benefits Assisted patient with completion of online Medicare Extra Help application    Patient Goals/Self-Care Activities: Perform IADL's (shopping, preparing meals, housekeeping, managing finances) independently Call provider office for new concerns or questions  Call [place] when you receive the decision letter [phone]     Plan:Telephone follow up appointment with care management team member scheduled for:  01/09/21 with PharmD and 01/14/21 with RNCM The patient has been provided with contact information for the care management team and has been advised to call with any health related questions or concerns.   Chong Sicilian, BSN, RN-BC Embedded Chronic Care Manager [facility] Direct Dial: [phone]

## 2020-12-05 NOTE — Patient Instructions (Signed)
Visit Information  Patient Care Plan: [facility] Care Plan     Problem Identified: Chronic Disease Management Needs   Priority: High  Onset Date: 12/05/2020     Long-Range Goal: Patient will Work with RN Care Manager to Develop a Plan of Care Regarding Chronic Care Management and Care Coordination Associated with HTN, hypothyroidism, arthritis, anxiety, prescription assistance needs   Start Date: 12/05/2020  Expected End Date: 12/05/2021  This Visit's Progress: On track  Priority: High  Note:   Current Barriers:  Financial constraints related to cost of medications  RNCM Clinical Goal(s):  Patient will  complete applications for prescription assistance  through collaboration with RN Care manager, provider, and care team.  Patient will work with RN Care Manager regarding Care Coordination and Care Management associated with hypertension, overactive bladder, hypothyroidism  Interventions: 1:1 collaboration with primary care provider regarding development and update of comprehensive plan of care as evidenced by provider attestation and co-signature Inter-disciplinary care team collaboration (see longitudinal plan of care) Evaluation of current treatment plan related to  self management and patient's adherence to plan as established by provider Discussed family/social support Discussed mobility and ability to perform ADLs   SDOH Barriers (Status: New goal.) Short Term Goal  Patient interviewed and SDOH assessment performed        SDOH Interventions    Flowsheet Row Most Recent Value  SDOH Interventions   Financial Strain Interventions Other (Comment)  [assisted with Medicare Extra Help Application]  Social Connections Interventions Intervention Not Indicated     Provided patient with information about Medicare Extra Help for Prescription Drug Coverage (LIS) and the limitations for Myrbetriq manufacturer assistance Discussed plans with patient for ongoing care management follow up and  provided patient with direct contact information for care management team Advised patient to call RN Care Manager [phone] when she receives her decision letter from [place] regarding Extra Help Assisted patient/caregiver with obtaining information about health plan benefits Assisted patient with completion of online Medicare Extra Help application    Patient Goals/Self-Care Activities: Perform IADL's (shopping, preparing meals, housekeeping, managing finances) independently Call provider office for new concerns or questions  Call [place] when you receive the decision letter [phone]      Patient verbalizes understanding of instructions provided today and agrees to view in [place].   Plan:Telephone follow up appointment with care management team member scheduled for:  01/09/21 with PharmD and 01/14/21 with RNCM The patient has been provided with contact information for the care management team and has been advised to call with any health related questions or concerns.   Chong Sicilian, BSN, RN-BC Embedded Chronic Care Manager [facility] [place] [facility] Direct Dial: [phone]

## 2020-12-30 ENCOUNTER — Telehealth: Payer: Self-pay | Admitting: Internal Medicine

## 2020-12-30 ENCOUNTER — Other Ambulatory Visit: Payer: Self-pay

## 2020-12-30 NOTE — Telephone Encounter (Signed)
Patient called stating she was bleeding from her rectum.  She doesn't think it's from her hemorrhoids as that is usually very painful.  The only pain she is having is in her lower abdomen.  Scheduled an appointment with Nevin Bloodgood on 1/12, but wants to know if she can be seen by Dr. Hilarie Fredrickson sooner rather than later.  Please advise.  Thank you.

## 2020-12-30 NOTE — Telephone Encounter (Addendum)
Spoke with pt. Pt states she has had about 4 or more episodes of bright red blood in stools in the last 2 weeks. Pt said she sees the blood when she wipes. She also says that she is experiencing right lower abdominal pain that comes and goes. Pt states that on 12/23 she had a dark black stool. Pt said she is currently having 2 stools per day. Pt is scheduled to see Carl Best on 12/30 at 1:30 pm. Pt instructed to go to ER if bleeding gets worse or she is experiencing any dizziness, lightheadedness, or shortness of breath. Pt verbalized understanding.

## 2020-12-31 DIAGNOSIS — M79662 Pain in left lower leg: Secondary | ICD-10-CM | POA: Diagnosis not present

## 2020-12-31 DIAGNOSIS — M7672 Peroneal tendinitis, left leg: Secondary | ICD-10-CM | POA: Diagnosis not present

## 2020-12-31 DIAGNOSIS — R52 Pain, unspecified: Secondary | ICD-10-CM | POA: Diagnosis not present

## 2021-01-01 ENCOUNTER — Ambulatory Visit (INDEPENDENT_AMBULATORY_CARE_PROVIDER_SITE_OTHER): Payer: Medicare Other

## 2021-01-01 ENCOUNTER — Ambulatory Visit (INDEPENDENT_AMBULATORY_CARE_PROVIDER_SITE_OTHER): Payer: Medicare Other | Admitting: Family

## 2021-01-01 VITALS — BP 146/70 | HR 72 | Resp 18

## 2021-01-01 DIAGNOSIS — R0602 Shortness of breath: Secondary | ICD-10-CM

## 2021-01-01 DIAGNOSIS — R531 Weakness: Secondary | ICD-10-CM

## 2021-01-01 DIAGNOSIS — R110 Nausea: Secondary | ICD-10-CM

## 2021-01-01 DIAGNOSIS — J439 Emphysema, unspecified: Secondary | ICD-10-CM | POA: Diagnosis not present

## 2021-01-01 MED ORDER — ONDANSETRON HCL 4 MG PO TABS: 4.0000 mg | ORAL_TABLET | Freq: Three times a day (TID) | ORAL | 0 refills | Status: DC | PRN

## 2021-01-01 NOTE — Patient Instructions (Addendum)
Weakness ?Weakness is a lack of strength. You may feel weak all over your body (generalized), or you may feel weak in one specific part of your body (focal). Common causes of weakness include: ?Infection and immune system disorders. ?Physical exhaustion. ?Internal bleeding or other blood loss that results in a lack of red blood cells (anemia). ?Dehydration. ?An imbalance in mineral (electrolyte) levels, such as potassium. ?Heart disease, circulation problems, or stroke. ?Other causes include: ?Some medicines or cancer treatment. ?Stress, anxiety, or depression. ?Nervous system disorders. ?Thyroid disorders. ?Loss of muscle strength because of age or inactivity. ?Poor sleep quality or sleep disorders. ?The cause of your weakness may not be known. Some causes of weakness can be serious, so it is important to see your health care provider. ?Follow these instructions at home: ?Activity ?Rest as needed. ?Try to get enough sleep. Most adults need 7-8 hours of quality sleep each night. Talk to your health care provider about how much sleep you need each night. ?Do exercises, such as arm curls and leg raises, for 30 minutes at least 2 days a week or as told by your health care provider. This helps build muscle strength. ?Consider working with a physical therapist or trainer who can develop an exercise plan to help you gain muscle strength. ?General instructions ? ?Take over-the-counter and prescription medicines only as told by your health care provider. ?Eat a healthy, well-balanced diet. This includes: ?Proteins to build muscles, such as lean meats and fish. ?Fresh fruits and vegetables. ?Carbohydrates to boost energy, such as whole grains. ?Drink enough fluid to keep your urine pale yellow. ?Keep all follow-up visits as told by your health care provider. This is important. ?Contact a health care provider if your weakness: ?Does not improve or gets worse. ?Affects your ability to think clearly. ?Affects your ability to do  your normal daily activities. ?Get help right away if you: ?Develop sudden weakness, especially on one side of your face or body. ?Have chest pain. ?Have trouble breathing or shortness of breath. ?Have problems with your vision. ?Have trouble talking or swallowing. ?Have trouble standing or walking. ?Are light-headed or lose consciousness. ?Summary ?Weakness is a lack of strength. You may feel weak all over your body or just in one specific part of your body. ?Weakness can be caused by a variety of things. In some cases, the cause may be unknown. ?Rest as needed, and try to get enough sleep. Most adults need 7-8 hours of quality sleep each night. ?Eat a healthy, well-balanced diet. ?This information is not intended to replace advice given to you by your health care provider. Make sure you discuss any questions you have with your health care provider. ?Document Revised: 07/27/2017 Document Reviewed: 07/27/2017 ?Elsevier Patient Education ? 2022 Elsevier Inc. ? ?

## 2021-01-01 NOTE — Progress Notes (Signed)
Subjective:    Patient ID: [name], female    DOB: 1940/06/08, 80 y.o.   MRN: [id number]  Chief Complaint  Patient presents with   Shortness of breath, nausea, weakness   Pt walks into the office today with complaints of intermittent weakness and fatigue. She states she went out to get something to eat earlier and started eating her fries and started feeling nausea and weak. She is anxious and feels hot and cold.   She has a GI appointment tomorrow for blood in her stools.  Anxiety Presents for follow-up visit. Symptoms include excessive worry, hyperventilation, irritability, nervous/anxious behavior, obsessions, palpitations and restlessness. Symptoms occur most days. The severity of symptoms is moderate.       Review of Systems  Constitutional:  Positive for irritability.  Cardiovascular:  Positive for palpitations.  Psychiatric/Behavioral:  The patient is nervous/anxious.   All other systems reviewed and are negative.  Family History  Problem Relation Age of Onset   Bipolar disorder Sister    Early death Mother    Early death Sister    Colon cancer Neg Hx    Esophageal cancer Neg Hx    Rectal cancer Neg Hx    Stomach cancer Neg Hx    Social History   Socioeconomic History   Marital status: Divorced    Spouse name: Not on file   Number of children: 2   Years of education: Not on file   Highest education level: Not on file  Occupational History   Occupation: Retired    Fish farm manager: RETIRED  Tobacco Use   Smoking status: Never   Smokeless tobacco: Never  Vaping Use   Vaping Use: Never used  Substance and Sexual Activity   Alcohol use: No    Alcohol/week: 0.0 standard drinks   Drug use: No   Sexual activity: Never    Birth control/protection: Post-menopausal  Other Topics Concern   Not on file  Social History Narrative   Patient lives at home alone and she is divorced.   Retired.   Education business course.   Right handed.   Caffeine sometimes tea.     Social Determinants of Health   Financial Resource Strain: Low Risk    Difficulty of Paying Living Expenses: Not very hard  Food Insecurity: No Food Insecurity   Worried About Charity fundraiser in the Last Year: Never true   Ran Out of Food in the Last Year: Never true  Transportation Needs: No Transportation Needs   Lack of Transportation (Medical): No   Lack of Transportation (Non-Medical): No  Physical Activity: Not on file  Stress: Not on file  Social Connections: Moderately Integrated   Frequency of Communication with Friends and Family: More than three times a week   Frequency of Social Gatherings with Friends and Family: More than three times a week   Attends Religious Services: More than 4 times per year   Active Member of [employer] or Organizations: Yes   Attends Music therapist: More than 4 times per year   Marital Status: Divorced       Objective:   Physical Exam Vitals reviewed.  Constitutional:      General: She is not in acute distress.    Appearance: She is well-developed.  HENT:     Head: Normocephalic and atraumatic.     Right Ear: Tympanic membrane normal.     Left Ear: Tympanic membrane normal.  Eyes:     Pupils: Pupils are equal, round,  and reactive to light.  Neck:     Thyroid: No thyromegaly.  Cardiovascular:     Rate and Rhythm: Normal rate and regular rhythm.     Heart sounds: Normal heart sounds. No murmur heard. Pulmonary:     Effort: Pulmonary effort is normal. No respiratory distress.     Breath sounds: Normal breath sounds. No wheezing.  Abdominal:     General: Bowel sounds are normal. There is no distension.     Palpations: Abdomen is soft.     Tenderness: There is no abdominal tenderness.  Musculoskeletal:        General: No tenderness. Normal range of motion.     Cervical back: Normal range of motion and neck supple.  Skin:    General: Skin is warm and dry.  Neurological:     Mental Status: She is alert and oriented  to person, place, and time.     Cranial Nerves: No cranial nerve deficit.     Deep Tendon Reflexes: Reflexes are normal and symmetric.  Psychiatric:        Behavior: Behavior normal.        Thought Content: Thought content normal.        Judgment: Judgment normal.      BP (!) 146/70    Pulse 72    Resp 18    LMP  (LMP Unknown)    SpO2 100%      Assessment & Plan:  Merril Isakson comes in today with chief complaint of Shortness of breath, nausea, weakness   Diagnosis and orders addressed:  1. Shortness of breath - EKG 12-Lead - DG Chest 2 View - CMP14+EGFR - CBC with Differential/Platelet - Ambulatory referral to Cardiology  2. Weakness - CMP14+EGFR - CBC with Differential/Platelet - Ambulatory referral to Cardiology  3. Nausea - ondansetron (ZOFRAN) 4 MG tablet; Take 1 tablet (4 mg total) by mouth every 8 (eight) hours as needed for nausea or vomiting.  Dispense: 20 tablet; Refill: 0 - Ambulatory referral to Cardiology   Labs pending Will given Zofran as needed  Needs to follow up with Cardiologists  During exam patient now seems at baseline. I believe she may have had a panic attack given the SOB, crying, and calmness presenting to office.    [name], FNP

## 2021-01-02 ENCOUNTER — Ambulatory Visit: Payer: Medicare Other | Admitting: Nurse Practitioner

## 2021-01-02 ENCOUNTER — Encounter: Payer: Self-pay | Admitting: Nurse Practitioner

## 2021-01-02 VITALS — BP 116/58 | HR 64 | Ht 69.0 in | Wt 163.5 lb

## 2021-01-02 DIAGNOSIS — R112 Nausea with vomiting, unspecified: Secondary | ICD-10-CM | POA: Diagnosis not present

## 2021-01-02 DIAGNOSIS — K921 Melena: Secondary | ICD-10-CM

## 2021-01-02 DIAGNOSIS — K625 Hemorrhage of anus and rectum: Secondary | ICD-10-CM

## 2021-01-02 DIAGNOSIS — R531 Weakness: Secondary | ICD-10-CM

## 2021-01-02 LAB — CBC WITH DIFFERENTIAL/PLATELET
Basophils Absolute: 0 10*3/uL (ref 0.0–0.2)
Basos: 0 %
EOS (ABSOLUTE): 0.2 10*3/uL (ref 0.0–0.4)
Eos: 3 %
Hematocrit: 37.6 % (ref 34.0–46.6)
Hemoglobin: 12.7 g/dL (ref 11.1–15.9)
Immature Grans (Abs): 0 10*3/uL (ref 0.0–0.1)
Immature Granulocytes: 0 %
Lymphocytes Absolute: 1.9 10*3/uL (ref 0.7–3.1)
Lymphs: 28 %
MCH: 30.9 pg (ref 26.6–33.0)
MCHC: 33.8 g/dL (ref 31.5–35.7)
MCV: 92 fL (ref 79–97)
Monocytes Absolute: 0.4 10*3/uL (ref 0.1–0.9)
Monocytes: 7 %
Neutrophils Absolute: 4.2 10*3/uL (ref 1.4–7.0)
Neutrophils: 62 %
Platelets: 236 10*3/uL (ref 150–450)
RBC: 4.11 x10E6/uL (ref 3.77–5.28)
RDW: 13 % (ref 11.7–15.4)
WBC: 6.7 10*3/uL (ref 3.4–10.8)

## 2021-01-02 LAB — CMP14+EGFR
ALT: 18 IU/L (ref 0–32)
AST: 33 IU/L (ref 0–40)
Albumin/Globulin Ratio: 2.4 — ABNORMAL HIGH (ref 1.2–2.2)
Albumin: 4.5 g/dL (ref 3.7–4.7)
Alkaline Phosphatase: 59 IU/L (ref 44–121)
BUN/Creatinine Ratio: 22 (ref 12–28)
BUN: 14 mg/dL (ref 8–27)
Bilirubin Total: 0.3 mg/dL (ref 0.0–1.2)
CO2: 27 mmol/L (ref 20–29)
Calcium: 9.5 mg/dL (ref 8.7–10.3)
Chloride: 102 mmol/L (ref 96–106)
Creatinine, Ser: 0.64 mg/dL (ref 0.57–1.00)
Globulin, Total: 1.9 g/dL (ref 1.5–4.5)
Glucose: 82 mg/dL (ref 70–99)
Potassium: 4.2 mmol/L (ref 3.5–5.2)
Sodium: 143 mmol/L (ref 134–144)
Total Protein: 6.4 g/dL (ref 6.0–8.5)
eGFR: 90 mL/min/{1.73_m2} (ref >59)

## 2021-01-02 MED ORDER — PANTOPRAZOLE SODIUM 20 MG PO TBEC: 20.0000 mg | DELAYED_RELEASE_TABLET | Freq: Every day | ORAL | 1 refills | Status: DC

## 2021-01-02 NOTE — Patient Instructions (Signed)
IMAGING: You will be contacted by [facility] (Your caller ID will indicate phone # [phone]) in the next 7 days to schedule your CT Scan. If you have not heard from them within 7 business days, please call [facility] at [phone] to follow up on the status of your appointment.    PROCEDURES: You have been scheduled for a EGD. Please follow the written instructions given to you at your visit today. If you use inhalers (even only as needed), please bring them with you on the day of your procedure.  MEDICATION: We have sent the following medication to your pharmacy for you to pick up at your convenience: Pantoprazole 20 MG once a day.  Please contact our office if black stools or rectal bleeding recurs.  It was great seeing you today! Thank you for entrusting me with your care and choosing [facility].  Noralyn Pick, CRNP  The [facility] providers would like to encourage you to use [facility] to communicate with providers for non-urgent requests or questions.  Due to long hold times on the telephone, sending your provider a message by [facility] may be faster and more efficient way to get a response. Please allow 48 business hours for a response.  Please remember that this is for non-urgent requests/questions.  If you are age 16 or older, your body mass index should be between 23-30. Your Body mass index is 24.14 kg/m. If this is out of the aforementioned range listed, please consider follow up with your Primary Care Provider.  If you are age 79 or younger, your body mass index should be between 19-25. Your Body mass index is 24.14 kg/m. If this is out of the aformentioned range listed, please consider follow up with your Primary Care Provider.

## 2021-01-02 NOTE — Progress Notes (Addendum)
01/02/2021 [name] [id number] 10-02-1940   Chief Complaint: Rectal bleeding, black stool x 1, weakness   History of Present Illness: [name] is a 80 year old female with a past medical history of hypertension, hyperlipidemia, hypothyroidism, sleep apnea, mild memory impairment, peripheral neuropathy, hiatal hernia, GERD, dysphagia responsive to dilatation, adenomatous colon polyps, submucosal lesion at the appendiceal orifice 02/2014 status postsurgical resection (path report showed benign fibrous obliteration of the appendix).    She is followed by Dr. Hilarie Fredrickson. She presents to our office today with complaints of generalized weakness, rectal bleeding and black stools. She started passing bright red blood iwht her bowel movements mid Nov. 2022 which she thought was due to having hemorrhoids. Two weeks ago, she passed a large volume of bright red blood with a bowel movement, she was alarmed at how much blood she saw on the toilet tissue. No associated abdominal pain, straining or anorectal pain. Since then, no significant rectal bleeding. On 12/27/2020 she passed a soft formed sticky "black tarry" stool without bright red blood without recurrence. No Pepto/bismuth or oral iron use. She has infrequent heartburn. Recent nausea and she vomited partially digested food a few times over the past 3 days. No coffee ground emesis or frank hematemesis. She took a few doses of Zofran without further vomiting. Her last BM was 2 days ago described as darker brown without obvious blood. No prior history of an UGI bleed.   She underwent an EGD 08/03/2018 which showed a gastric polyps otherwise was normal. Her most recent colonoscopy was 05/14/2019 and 5 polyps were removed, path report identified tubular adenomatous and on inflammatory polyp. Diverticulosis to the descending and sigmoid colon was present and internal hemorrhoids noted.   Her main concern today is generalized weakness. She is unable to keep  up with her normal activities which is distressing to her. She reports having balance issues for the past few months resulting in several falls at home without head injury, followed by neurology. No fevers, sweats or chills. She has lost < 5lbs over the past week. She informed her PCP she had some SOB, no chest pain. A chest xray 01/01/2021 showed evidence of emphysema, a 12 lead EKG without acute ischemia and she was referred to cardiology. Labs 01/01/2021 which showed a normal CMP. Hg 13.5 -> 12.7. She is on ASA 81mg  QD, no other NSAID use. No alcohol use.   CBC Latest Ref Rng & Units 01/01/2021 10/13/2020 04/11/2020  WBC 3.4 - 10.8 x10E3/uL 6.7 4.3 5.4  Hemoglobin 11.1 - 15.9 g/dL 12.7 13.5 13.1  Hematocrit 34.0 - 46.6 % 37.6 39.5 39.3  Platelets 150 - 450 x10E3/uL 236 195 229    CMP Latest Ref Rng & Units 01/01/2021 10/29/2020 10/13/2020  Glucose 70 - 99 mg/dL 82 - 86  BUN 8 - 27 mg/dL 14 - 13  Creatinine 0.57 - 1.00 mg/dL 0.64 - 0.65  Sodium 134 - 144 mmol/L 143 - 142  Potassium 3.5 - 5.2 mmol/L 4.2 - 4.3  Chloride 96 - 106 mmol/L 102 - 104  CO2 20 - 29 mmol/L 27 - 20  Calcium 8.7 - 10.3 mg/dL 9.5 - 9.1  Total Protein 6.0 - 8.5 g/dL 6.4 6.7 6.7  Total Bilirubin 0.0 - 1.2 mg/dL 0.3 - 0.5  Alkaline Phos 44 - 121 IU/L 59 - 64  AST 0 - 40 IU/L 33 - 23  ALT 0 - 32 IU/L 18 - 15    Current Outpatient Medications on File Prior to  Visit  Medication Sig Dispense Refill   albuterol (VENTOLIN HFA) 108 (90 Base) MCG/ACT inhaler Inhale 2 puffs into the lungs every 6 (six) hours as needed for wheezing or shortness of breath. 8 g 0   aspirin 81 MG tablet Take 81 mg by mouth daily.     busPIRone (BUSPAR) 30 MG tablet Take 0.5-1 tablets (15-30 mg total) by mouth daily as needed. TAKE (1/2) TO (1) TABLET BY MOUTH ONCE DAILY AS NEEDED 90 tablet 3   Calcium Carbonate-Vitamin D 500-125 MG-UNIT TABS Take 1 capsule by mouth daily.     Cyanocobalamin (VITAMIN B 12 PO) Take 1 tablet by mouth daily.       Diclofenac Sodium 3 % GEL Apply 1 g topically 2 (two) times daily as needed. 60 g 3   dimenhyDRINATE (DRAMAMINE) 50 MG tablet Take 50 mg by mouth every 8 (eight) hours as needed.     donepezil (ARICEPT) 10 MG tablet TAKE 1 TABLET BY MOUTH AT BEDTIME. 90 tablet 3   ezetimibe (ZETIA) 10 MG tablet Take 1 tablet (10 mg total) by mouth daily. 90 tablet 3   fluticasone (FLONASE) 50 MCG/ACT nasal spray Place 2 sprays into both nostrils daily. 16 g 6   gabapentin (NEURONTIN) 300 MG capsule TAKE 1 CAPSULE EACH MORNING AND 2 AT BEDTIME. 270 capsule 3   hydrochlorothiazide (HYDRODIURIL) 12.5 MG tablet Take 1 tablet (12.5 mg total) by mouth daily. 90 tablet 3   ibuprofen (ADVIL,MOTRIN) 200 MG tablet Take 800 mg by mouth every 6 (six) hours as needed (Pain).      levothyroxine (SYNTHROID) 125 MCG tablet Take 1 tablet (125 mcg total) by mouth daily. 90 tablet 3   lisinopril (ZESTRIL) 2.5 MG tablet Take 1 tablet (2.5 mg total) by mouth daily. 90 tablet 3   memantine (NAMENDA) 5 MG tablet Take 1 tablet (5 mg total) by mouth 2 (two) times daily. 180 tablet 3   mirabegron ER (MYRBETRIQ) 25 MG TB24 tablet Take 1 tablet (25 mg total) by mouth daily. 90 tablet 3   Multiple Vitamin (MULTIVITAMIN) capsule Take 1 capsule by mouth daily.     Omega-3 Fatty Acids (FISH OIL) 1000 MG CAPS Take 1,000 mg by mouth daily.     ondansetron (ZOFRAN) 4 MG tablet Take 1 tablet (4 mg total) by mouth every 8 (eight) hours as needed for nausea or vomiting. 20 tablet 0   triamcinolone ointment (KENALOG) 0.5 % Apply 1 application topically 2 (two) times daily. 60 g 1   No current facility-administered medications on file prior to visit.   Allergies  Allergen Reactions   Atorvastatin Other (See Comments)    Muscle cramps   Meperidine Hcl Nausea And Vomiting    Makes me 'deathly' sick   Statins Other (See Comments)    REACTION: Patient cannot tolerate due cramps   Norco [Hydrocodone-Acetaminophen] Nausea And Vomiting   Sulfonamide  Derivatives Other (See Comments)    Unknown reaction   Current Medications, Allergies, Past Medical History, Past Surgical History, Family History and Social History were reviewed in [employer] record.  Review of Systems:   Constitutional: See HPI.  Respiratory: See HPI.  Cardiovascular: Negative for chest pain, palpitations and leg swelling.  Gastrointestinal: See HPI.  Musculoskeletal: Negative for back pain or muscle aches.  Neurological: + Balance and memory issues.   Physical Exam: LMP  (LMP Unknown)  BP (!) 116/58    Pulse 64    Ht 5\' 9"  (1.753 m)  Wt 163 lb 8 oz (74.2 kg)    LMP  (LMP Unknown)    BMI 24.14 kg/m  Wt Readings from Last 3 Encounters:  01/02/21 163 lb 8 oz (74.2 kg)  10/29/20 161 lb (73 kg)  10/13/20 165 lb (74.8 kg)    General: 80 year old female in NAD.  Head: Normocephalic and atraumatic. Eyes: No scleral icterus. Conjunctiva pink . Ears: Normal auditory acuity. Mouth: Upper dentures, partial lower plate. No ulcers or lesions.  Lungs: Clear throughout to auscultation. Heart: Regular rate and rhythm, no murmur. Abdomen: Soft, nontender and nondistended. No masses or hepatomegaly. Normal bowel sounds x 4 quadrants.  Rectal: Noninflamed external hemorrhoids. Internal hemorrhoids without prolapse. Brown stool in the recta vault. Guaiac negative. Stephany CMA present during exam.  Musculoskeletal: Symmetrical with no gross deformities. Extremities: No edema. Neurological: Alert oriented x 4. No focal deficits.  Psychological: Alert and cooperative. Normal mood and affect  Assessment and Recommendations:  56) 80 year old female with a history of hemorrhoids and left sided diverticulosis presents with rectal bleeding, likely hemorrhoidal. She had one episode of painless large volume hematochezia 2 weeks ago, query diverticular bleed at that time. Hg 12.7 (Hg 13.5 on 10/13/2020).  -Flex sigmoidoscopy if rectal bleeding recurs -Abd/pelvic  CTA in ED if large volume hematochezia occurs  -Apply a small amount of Desitin inside the anal opening and to the external anal area tid as needed for anal or hemorrhoidal irritation/bleeding.  -Patient to contact our office if rectal bleeding recurs   2) History of GERD, patient reported passing a soft formed black tarry stool x 1 on 12/27/2020 without recurrence. EGD 07/2018  showed a gastric polyp otherwise was negative.  -Pantoprazole 20mg  one po QD -EGD benefits and risks discussed including risk with sedation, risk of bleeding, perforation and infection. Cardiac clearance required prior to proceeding with EGD ( Note EGD tentatively scheduled with Dr. Hilarie Fredrickson on 01/22/2021).   3) Nausea with nonbloody emesis x 2 or 3 episodes. Normal CBC and LFTs. -CTAP with oral and IV contrast  -Check lipase level if vomiting recurs  -Zofran PRN -EGD and PPI as noted above  -Diet as tolerated, avoid larger meals   4) Generalized weakness -CTAP as ordered above, rule out any inflammatory/infectious/malignant process   5) History of tubular adenomatous colon polyps per colonoscopy 05/2019 -No further colon polyp surveillance colonoscopies due to age   42) SOB, no CP. Cardiology consult ordered per PCP. Note, I was not aware the patient was referred to cardiology during the patient's appointment time today.  Patient will be contacted by our office regarding cardiac clearance now required prior to proceeding with an EGD.  Further recommendations to be determined after CTAP results reviewed.    Today's encounter was 40 minutes including pre charting, result review, obtaining history and a physical exam, coordinating treatment plan, patient counseling and documentation.   ADDENDUM: Patient underwent negative nuclear stress test on 01/13/2021.  I communicated with cardiologist Dr. Percival Spanish and he provided cardiac clearance, patient to proceed with EGD as scheduled.

## 2021-01-03 DIAGNOSIS — R112 Nausea with vomiting, unspecified: Secondary | ICD-10-CM | POA: Insufficient documentation

## 2021-01-03 DIAGNOSIS — E890 Postprocedural hypothyroidism: Secondary | ICD-10-CM

## 2021-01-03 DIAGNOSIS — I10 Essential (primary) hypertension: Secondary | ICD-10-CM

## 2021-01-03 DIAGNOSIS — K625 Hemorrhage of anus and rectum: Secondary | ICD-10-CM | POA: Insufficient documentation

## 2021-01-03 DIAGNOSIS — K921 Melena: Secondary | ICD-10-CM | POA: Insufficient documentation

## 2021-01-05 NOTE — Progress Notes (Signed)
Addendum: Reviewed and agree with assessment and management plan. Agree with complicated symptoms.  Would reserve flex sig for recurrent bleeding not known to be hemorrhoidal. EGD certainly reasonable given hx of gastric adenoma, but agree with cardiology clearance 1st.  Delay scheduled EGD if cards clearance not obtained 1st.  Kailin Leu, Lajuan Lines, MD

## 2021-01-07 ENCOUNTER — Encounter: Payer: Self-pay | Admitting: Cardiology

## 2021-01-07 ENCOUNTER — Ambulatory Visit (INDEPENDENT_AMBULATORY_CARE_PROVIDER_SITE_OTHER): Payer: Medicare Other | Admitting: Cardiology

## 2021-01-07 ENCOUNTER — Telehealth: Payer: Self-pay

## 2021-01-07 ENCOUNTER — Telehealth: Payer: Self-pay | Admitting: Cardiology

## 2021-01-07 ENCOUNTER — Encounter: Payer: Self-pay | Admitting: *Deleted

## 2021-01-07 ENCOUNTER — Other Ambulatory Visit: Payer: Self-pay

## 2021-01-07 VITALS — BP 128/60 | HR 63 | Ht 69.0 in | Wt 160.0 lb

## 2021-01-07 DIAGNOSIS — R079 Chest pain, unspecified: Secondary | ICD-10-CM | POA: Diagnosis not present

## 2021-01-07 DIAGNOSIS — R0602 Shortness of breath: Secondary | ICD-10-CM

## 2021-01-07 DIAGNOSIS — R531 Weakness: Secondary | ICD-10-CM

## 2021-01-07 NOTE — Telephone Encounter (Signed)
Dr. Percival Spanish,  Our mutual patient has appointment with you this morning. She is scheduled on 01/22/21 with [place] for EGD, we need cardiac evaluation and clearance. Thank you.  Request for surgical clearance:     Endoscopy Procedure  What type of surgery is being performed?     EGD  When is this surgery scheduled?     01/22/21  What type of clearance is required ?  We need cardiac evaluation and clearance before we can proceed.    Practice name and name of physician performing surgery?      [facility]  What is your office phone and fax number?      Phone- [phone]  [phone]  Anesthesia type (None, local, MAC, general) ?       MAC

## 2021-01-07 NOTE — Patient Instructions (Addendum)
Medication Instructions:  The current medical regimen is effective;  continue present plan and medications.  *If you need a refill on your cardiac medications before your next appointment, please call your pharmacy*  Testing/Procedures: Your physician has requested that you have a lexiscan myoview. This will be completed at [facility] and you will be called to be scheduled.  Follow-Up: At [facility], you and your health needs are our priority.  As part of our continuing mission to provide you with exceptional heart care, we have created designated Provider Care Teams.  These Care Teams include your primary Cardiologist (physician) and Advanced Practice Providers (APPs -  Physician Assistants and Nurse Practitioners) who all work together to provide you with the care you need, when you need it.  We recommend signing up for the patient portal called "MyChart".  Sign up information is provided on this After Visit Summary.  MyChart is used to connect with patients for Virtual Visits (Telemedicine).  Patients are able to view lab/test results, encounter notes, upcoming appointments, etc.  Non-urgent messages can be sent to your provider as well.   To learn more about what you can do with MyChart, go to [email or web address].    Your next appointment:   1 year(s)  The format for your next appointment:   In Person  Provider:   Minus Breeding, MD    Thank you for choosing [facility]!!

## 2021-01-07 NOTE — Progress Notes (Signed)
Patient has consult with cardiology scheduled 01/08/2021, await cardiology recommendations.

## 2021-01-07 NOTE — Telephone Encounter (Signed)
Checking percert on the following patient for testing scheduled at [facility].     LEXISCAN   01/13/2021

## 2021-01-07 NOTE — Progress Notes (Addendum)
Cardiology Office Note   Date:  01/07/2021   ID:  [name], DOB Sep 27, 1940, MRN [id number]  PCP:  Dettinger, Fransisca Kaufmann, MD  Cardiologist:   None   Chief Complaint  Patient presents with   Fatigue      History of Present Illness: [name] is a 81 y.o. female who presents for follow up of a presyncopal episode.  I saw her for chest pain in 2017.  She had a negative perfusion study.   In August 2019 she had episode of presyncope.  She sees Dr. Claiborne Billings and plan severe sleep apnea has never been instructed on how to use it.   At her last primary care office visit she was complaining of weakness and shortness of breath.  She was added to my schedule this morning because of these acute complaints.  She really has multiple complaints.  She is actually about to have an EGD because of nausea.  When she saw.  Her biggest complaint was severe fatigue.  She felt so weak on his days that she thought she was passed out.  He described vomiting.  She describes sharp shooting pain under her right breast.  She describes sporadic other discomfort and said it was severe on Christmas Eve.  She really cannot qualify or quantify.  She does not describe associated diaphoresis.  She says she is chronically short of breath.  She said she has nausea and vomiting as described.  None of the symptoms necessarily seem to be associated.  Her biggest complaint really seems to be 15 other she has multiple somatic complaints.  I do note from previous CT of her chest that she has aortic atherosclerosis and mention of coronary calcium.   Past Medical History:  Diagnosis Date   Anal or rectal pain    Anxiety state, unspecified    Arthritis    Arthropathy, unspecified, site unspecified    Carpal tunnel syndrome    Bilateral   Cataract    Chronic airway obstruction, not elsewhere classified    patient denies on 02/27/14   Esophageal reflux    Esophageal stricture    Hypertension    Internal hemorrhoids without  mention of complication    Malignant neoplasm of breast (female), unspecified site    left breast    Memory disorder 09/24/2013   Nocturnal leg cramps 06/29/2018   Osteopenia    Other and unspecified hyperlipidemia    Pain in joint, multiple sites    Personal history of colonic polyps    adenomatous   Polyneuropathy in other diseases classified elsewhere ([place]) 09/24/2013   Shingles    Sleep apnea    no longer uses cpap   Tubular adenoma of colon    Unspecified hypothyroidism     Past Surgical History:  Procedure Laterality Date   APPENDECTOMY  03/2014   BACK SURGERY     X2   BREAST LUMPECTOMY     COLONOSCOPY  08/03/2018   EYE SURGERY     cataracts bilateral   LAPAROSCOPIC APPENDECTOMY N/A 02/28/2014   Procedure: APPENDECTOMY LAPAROSCOPIC PARTIAL CECECTOMY FOR MUCOCELE OF APPENDIX ;  Surgeon: Michael Boston, MD;  Location: [facility];  Service: General;  Laterality: N/A;   MASTECTOMY     left   THYROIDECTOMY     TUBAL LIGATION       Current Outpatient Medications  Medication Sig Dispense Refill   albuterol (VENTOLIN HFA) 108 (90 Base) MCG/ACT inhaler Inhale 2 puffs into the lungs every 6 (six)  hours as needed for wheezing or shortness of breath. 8 g 0   aspirin 81 MG tablet Take 81 mg by mouth daily.     busPIRone (BUSPAR) 30 MG tablet Take 0.5-1 tablets (15-30 mg total) by mouth daily as needed. TAKE (1/2) TO (1) TABLET BY MOUTH ONCE DAILY AS NEEDED 90 tablet 3   Calcium Carbonate-Vitamin D 500-125 MG-UNIT TABS Take 1 capsule by mouth daily.     Cyanocobalamin (VITAMIN B 12 PO) Take 1 tablet by mouth daily.      Diclofenac Sodium 3 % GEL Apply 1 g topically 2 (two) times daily as needed. 60 g 3   dimenhyDRINATE (DRAMAMINE) 50 MG tablet Take 50 mg by mouth every 8 (eight) hours as needed.     donepezil (ARICEPT) 10 MG tablet TAKE 1 TABLET BY MOUTH AT BEDTIME. 90 tablet 3   ezetimibe (ZETIA) 10 MG tablet Take 1 tablet (10 mg total) by mouth daily. 90 tablet 3   fluticasone  (FLONASE) 50 MCG/ACT nasal spray Place 2 sprays into both nostrils daily. 16 g 6   gabapentin (NEURONTIN) 300 MG capsule TAKE 1 CAPSULE EACH MORNING AND 2 AT BEDTIME. 270 capsule 3   hydrochlorothiazide (HYDRODIURIL) 12.5 MG tablet Take 1 tablet (12.5 mg total) by mouth daily. 90 tablet 3   ibuprofen (ADVIL,MOTRIN) 200 MG tablet Take 800 mg by mouth every 6 (six) hours as needed (Pain).      levothyroxine (SYNTHROID) 125 MCG tablet Take 1 tablet (125 mcg total) by mouth daily. 90 tablet 3   lisinopril (ZESTRIL) 2.5 MG tablet Take 1 tablet (2.5 mg total) by mouth daily. 90 tablet 3   memantine (NAMENDA) 5 MG tablet Take 1 tablet (5 mg total) by mouth 2 (two) times daily. 180 tablet 3   mirabegron ER (MYRBETRIQ) 25 MG TB24 tablet Take 1 tablet (25 mg total) by mouth daily. 90 tablet 3   Multiple Vitamin (MULTIVITAMIN) capsule Take 1 capsule by mouth daily.     Omega-3 Fatty Acids (FISH OIL) 1000 MG CAPS Take 1,000 mg by mouth daily.     ondansetron (ZOFRAN) 4 MG tablet Take 1 tablet (4 mg total) by mouth every 8 (eight) hours as needed for nausea or vomiting. 20 tablet 0   pantoprazole (PROTONIX) 20 MG tablet Take 1 tablet (20 mg total) by mouth daily. 30 tablet 1   triamcinolone ointment (KENALOG) 0.5 % Apply 1 application topically 2 (two) times daily. 60 g 1   No current facility-administered medications for this visit.    Allergies:   Atorvastatin, Meperidine hcl, Statins, Norco [hydrocodone-acetaminophen], and Sulfonamide derivatives   ROS:  Please see the history of present illness.   Otherwise, review of systems are positive balance, falls, urinary incontinence.   All other systems are reviewed and negative.    PHYSICAL EXAM: VS:  BP 128/60    Pulse 63    Ht 5\' 9"  (1.753 m)    Wt 160 lb (72.6 kg)    LMP  (LMP Unknown)    SpO2 99%    BMI 23.63 kg/m  , BMI Body mass index is 23.63 kg/m. GENERAL:  Well appearing NECK:  No jugular venous distention, waveform within normal limits, carotid  upstroke brisk and symmetric, no bruits, no thyromegaly LUNGS:  Clear to auscultation bilaterally CHEST:  Unremarkable HEART:  PMI not displaced or sustained,S1 and S2 within normal limits, no S3, no S4, no clicks, no rubs, no murmurs ABD:  Flat, positive bowel sounds normal in frequency  in pitch, no bruits, no rebound, no guarding, no midline pulsatile mass, no hepatomegaly, no splenomegaly EXT:  2 plus pulses throughout, no edema, no cyanosis no clubbing   EKG:  EKG is not ordered today. 01/01/2021 sinus rhythm rate 82, axis within normal limits, intervals within normal premature ectopic complexes, no acute ST-T wave changes.   Recent Labs: 10/13/2020: TSH 8.150 01/01/2021: ALT 18; BUN 14; Creatinine, Ser 0.64; Hemoglobin 12.7; Platelets 236; Potassium 4.2; Sodium 143    Lipid Panel    Component Value Date/Time   CHOL 247 (H) 10/13/2020 0939   CHOL 228 (H) 05/17/2012 1134   TRIG 110 10/13/2020 0939   TRIG 92 10/01/2015 1129   TRIG 88 05/17/2012 1134   HDL 52 10/13/2020 0939   HDL 61 10/01/2015 1129   HDL 55 05/17/2012 1134   CHOLHDL 4.8 (H) 10/13/2020 0939   LDLCALC 175 (H) 10/13/2020 0939   LDLCALC 161 (H) 09/28/2013 0823   LDLCALC 155 (H) 05/17/2012 1134   LDLDIRECT 164 (H) 06/07/2014 0858      Wt Readings from Last 3 Encounters:  01/07/21 160 lb (72.6 kg)  01/02/21 163 lb 8 oz (74.2 kg)  10/29/20 161 lb (73 kg)      Other studies Reviewed: Additional studies/ records that were reviewed today include: Labs.   Review of the above records demonstrates:  Please see elsewhere in the note.     ASSESSMENT AND PLAN:  SOB: The patient has multiple somatic complaints but does not describe chest discomfort.  She has no coronary calcium seen on previous CT incidentally.  I am going to send her for a stress test but she would not get with the treadmill so she will have a Lexiscan Myoview.  WEAKNESS: This has been present and chronic complaint.  I did note that her labs  have been normal including CBC and TSH.  I did review her monitor that she had a few years ago and there was no significant bradycardia arrhythmias to explain this.  This may be related to her sleep apnea which was addressed as below.  BRADYCARDIA:   She has not had syncope.  Has had mechanical falls.  She had no severe arrhythmias on previous monitor.  No further testing.  HTN:  The blood pressure is at target.  No change in therapy.  At target.  No change in therapy.   FATIGUE:  I went back and reviewed notes from Dr. Claiborne Billings.   There is some confusion about her CPAP.  She has been waiting for instructions on how to use it per her report.  I will try to get her follow up and see if she cannot initiate this.  I saw mention that she did not tolerate it butt she denies this. Marland Kitchen   SLEEP APNEA:  See above.   PRE PROCEDURE CARDIOVASCULAR CLEARANCE:  This will be pending the perfusion study   ADDENDUM: We will try to keep her from having go back in Sleep apnea: Because of the time lapse between the original sleep study and this 1 sleep study needs to be repeated per insurance guidelines.  So a home sleep study will be ordered.  Current medicines are reviewed at length with the patient today.  The patient does not have concerns regarding medicines.  The following changes have been made:  no change  Labs/ tests ordered today include:   Orders Placed This Encounter  Procedures   NM Myocar Multi W/Spect W/Wall Motion / EF  Disposition:   FU with me as needed.     Signed, Minus Breeding, MD  01/07/2021 11:37 AM    [facility]

## 2021-01-08 NOTE — Telephone Encounter (Signed)
Clearance is pending until she has a treadmill test 01/13/21.

## 2021-01-09 ENCOUNTER — Telehealth: Payer: Medicare Other | Admitting: Pharmacist

## 2021-01-09 ENCOUNTER — Telehealth: Payer: Self-pay | Admitting: Pharmacist

## 2021-01-09 NOTE — Telephone Encounter (Signed)
°  Care Management   Follow Up Note   01/09/2021 Name: [name] MRN: [id number] DOB: 17-Apr-1940   Referred by: Dettinger, Fransisca Kaufmann, MD Reason for referral : Appointment (Reschedule if needed)   An unsuccessful telephone outreach was attempted today. The patient was referred to the case management team for assistance with care management and care coordination.   Follow Up Plan: The care management team will reach out to the patient again to reschedule   [name] Eck, PharmD, BCPS Clinical Pharmacist, [place]  II Phone [phone]

## 2021-01-13 ENCOUNTER — Other Ambulatory Visit: Payer: Self-pay

## 2021-01-13 ENCOUNTER — Ambulatory Visit (HOSPITAL_COMMUNITY)
Admission: RE | Admit: 2021-01-13 | Discharge: 2021-01-13 | Disposition: A | Discharge status: Home / Self Care | Payer: Medicare Other | Source: Ambulatory Visit | Attending: Cardiology | Admitting: Cardiology

## 2021-01-13 ENCOUNTER — Other Ambulatory Visit: Payer: Self-pay | Admitting: Cardiology

## 2021-01-13 ENCOUNTER — Encounter (HOSPITAL_COMMUNITY)
Admission: RE | Admit: 2021-01-13 | Discharge: 2021-01-13 | Disposition: A | Discharge status: Home / Self Care | Payer: Medicare Other | Source: Ambulatory Visit | Attending: Cardiology | Admitting: Cardiology

## 2021-01-13 DIAGNOSIS — R079 Chest pain, unspecified: Secondary | ICD-10-CM | POA: Diagnosis not present

## 2021-01-13 DIAGNOSIS — G4733 Obstructive sleep apnea (adult) (pediatric): Secondary | ICD-10-CM

## 2021-01-13 DIAGNOSIS — R0602 Shortness of breath: Secondary | ICD-10-CM | POA: Diagnosis not present

## 2021-01-13 LAB — NM MYOCAR MULTI W/SPECT W/WALL MOTION / EF
LV dias vol: 91 mL (ref 46–106)
LV sys vol: 37 mL
Nuc Stress EF: 59 %
Peak HR: 96 {beats}/min
RATE: 0.2
Rest HR: 54 {beats}/min
Rest Nuclear Isotope Dose: 11 mCi
SDS: 1
SRS: 6
SSS: 7
ST Depression (mm): 0 mm
Stress Nuclear Isotope Dose: 30 mCi
TID: 0.98

## 2021-01-13 MED ORDER — TECHNETIUM TC 99M TETROFOSMIN IV KIT
10.0000 | PACK | Freq: Once | INTRAVENOUS | Status: AC | PRN
  Administered 2021-01-13: 11 via INTRAVENOUS

## 2021-01-13 MED ORDER — TECHNETIUM TC 99M TETROFOSMIN IV KIT
30.0000 | PACK | Freq: Once | INTRAVENOUS | Status: AC | PRN
  Administered 2021-01-13: 30 via INTRAVENOUS

## 2021-01-13 MED ORDER — REGADENOSON 0.4 MG/5ML IV SOLN
INTRAVENOUS | Status: AC
  Administered 2021-01-13: 0.4 mg via INTRAVENOUS
  Filled 2021-01-13: qty 5

## 2021-01-13 MED ORDER — SODIUM CHLORIDE FLUSH 0.9 % IV SOLN
INTRAVENOUS | Status: AC
  Administered 2021-01-13: 10 mL via INTRAVENOUS
  Filled 2021-01-13: qty 10

## 2021-01-14 ENCOUNTER — Ambulatory Visit (INDEPENDENT_AMBULATORY_CARE_PROVIDER_SITE_OTHER): Payer: Medicare Other | Admitting: *Deleted

## 2021-01-14 DIAGNOSIS — N3281 Overactive bladder: Secondary | ICD-10-CM

## 2021-01-14 DIAGNOSIS — I10 Essential (primary) hypertension: Secondary | ICD-10-CM

## 2021-01-14 DIAGNOSIS — F411 Generalized anxiety disorder: Secondary | ICD-10-CM

## 2021-01-14 NOTE — Telephone Encounter (Signed)
Please advise if mutual patient is cleared to proceed with EGD from a cardiac standpoint. Thank you for your time and assistance.

## 2021-01-14 NOTE — Chronic Care Management (AMB) (Signed)
Chronic Care Management   CCM RN Visit Note  01/14/2021 Name: [name] MRN: [id number] DOB: 09-Apr-1940  Subjective: [name] is a 81 y.o. year old female who is a primary care patient of Dettinger, Fransisca Kaufmann, MD. The care management team was consulted for assistance with disease management and care coordination needs.    Engaged with patient by telephone for follow up visit in response to provider referral for case management and/or care coordination services.   Consent to Services:  The patient was given information about Chronic Care Management services, agreed to services, and gave verbal consent prior to initiation of services.  Please see initial visit note for detailed documentation.   Patient agreed to services and verbal consent obtained.   Assessment: Review of patient past medical history, allergies, medications, health status, including review of consultants reports, laboratory and other test data, was performed as part of comprehensive evaluation and provision of chronic care management services.   SDOH (Social Determinants of Health) assessments and interventions performed:    CCM Care Plan  Allergies  Allergen Reactions   Atorvastatin Other (See Comments)    Muscle cramps   Meperidine Hcl Nausea And Vomiting    Makes me 'deathly' sick   Statins Other (See Comments)    REACTION: Patient cannot tolerate due cramps   Norco [Hydrocodone-Acetaminophen] Nausea And Vomiting   Sulfonamide Derivatives Other (See Comments)    Unknown reaction    Outpatient Encounter Medications as of 01/14/2021  Medication Sig   albuterol (VENTOLIN HFA) 108 (90 Base) MCG/ACT inhaler Inhale 2 puffs into the lungs every 6 (six) hours as needed for wheezing or shortness of breath.   aspirin 81 MG tablet Take 81 mg by mouth daily.   busPIRone (BUSPAR) 30 MG tablet Take 0.5-1 tablets (15-30 mg total) by mouth daily as needed. TAKE (1/2) TO (1) TABLET BY MOUTH ONCE DAILY AS NEEDED    Calcium Carbonate-Vitamin D 500-125 MG-UNIT TABS Take 1 capsule by mouth daily.   Cyanocobalamin (VITAMIN B 12 PO) Take 1 tablet by mouth daily.    Diclofenac Sodium 3 % GEL Apply 1 g topically 2 (two) times daily as needed.   dimenhyDRINATE (DRAMAMINE) 50 MG tablet Take 50 mg by mouth every 8 (eight) hours as needed.   donepezil (ARICEPT) 10 MG tablet TAKE 1 TABLET BY MOUTH AT BEDTIME.   ezetimibe (ZETIA) 10 MG tablet Take 1 tablet (10 mg total) by mouth daily.   fluticasone (FLONASE) 50 MCG/ACT nasal spray Place 2 sprays into both nostrils daily.   gabapentin (NEURONTIN) 300 MG capsule TAKE 1 CAPSULE EACH MORNING AND 2 AT BEDTIME.   hydrochlorothiazide (HYDRODIURIL) 12.5 MG tablet Take 1 tablet (12.5 mg total) by mouth daily.   ibuprofen (ADVIL,MOTRIN) 200 MG tablet Take 800 mg by mouth every 6 (six) hours as needed (Pain).    levothyroxine (SYNTHROID) 125 MCG tablet Take 1 tablet (125 mcg total) by mouth daily.   lisinopril (ZESTRIL) 2.5 MG tablet Take 1 tablet (2.5 mg total) by mouth daily.   memantine (NAMENDA) 5 MG tablet Take 1 tablet (5 mg total) by mouth 2 (two) times daily.   mirabegron ER (MYRBETRIQ) 25 MG TB24 tablet Take 1 tablet (25 mg total) by mouth daily.   Multiple Vitamin (MULTIVITAMIN) capsule Take 1 capsule by mouth daily.   Omega-3 Fatty Acids (FISH OIL) 1000 MG CAPS Take 1,000 mg by mouth daily.   ondansetron (ZOFRAN) 4 MG tablet Take 1 tablet (4 mg total) by mouth every 8 (  eight) hours as needed for nausea or vomiting.   pantoprazole (PROTONIX) 20 MG tablet Take 1 tablet (20 mg total) by mouth daily.   triamcinolone ointment (KENALOG) 0.5 % Apply 1 application topically 2 (two) times daily.   No facility-administered encounter medications on file as of 01/14/2021.    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 02/28/2014 02/28/2014   Polyneuropathy in other diseases classified elsewhere ([place]) 09/24/2013   Memory disorder 09/24/2013   Sleep apnea 05/15/2013   Hypothyroidism, postsurgical 05/15/2013   HTN (hypertension) 05/15/2013   Vitamin D deficiency 05/15/2013   Hyperlipidemia 05/15/2013   Osteopenia 10/25/2012   Trigeminal neuralgia 04/02/2011   GERD (gastroesophageal reflux disease) 07/23/2010   ANXIETY 05/24/2008   COPD (chronic obstructive pulmonary disease) ([place]) 05/24/2008   ARTHRITIS 05/24/2008   Idiopathic osteoporosis 05/24/2008   Malignant neoplasm of upper-outer quadrant of left female breast ([place]) 05/22/2008   COLONIC POLYPS, ADENOMATOUS, HX OF 05/22/2008    Conditions to be addressed/monitored:HTN, Anxiety, and prescription assistance needs  Care Plan : [facility] Care Plan  Updates made by Ilean China, RN since 01/14/2021 12:00 AM     Problem: Chronic Disease Management Needs   Priority: High  Onset Date: 12/05/2020     Long-Range Goal: Patient will Work with RN Care Manager to Develop a Plan of Care Regarding Chronic Care Management and Care Coordination Associated with HTN, hypothyroidism, arthritis, anxiety, prescription assistance needs   Start Date: 12/05/2020  Expected End Date: 12/05/2021  This Visit's Progress: On track  Recent Progress: On track  Priority: High  Note:   Current Barriers:  Financial constraints related to cost of medications  RNCM Clinical Goal(s):  Patient will  complete applications for prescription assistance  through collaboration with RN Care manager, provider, and care team.  Patient will work with RN Care Manager regarding Care Coordination and Care Management associated with hypertension, overactive bladder, hypothyroidism  Interventions: 1:1 collaboration with  primary care provider regarding development and update of comprehensive plan of care as evidenced by provider attestation and co-signature Inter-disciplinary care team collaboration (see longitudinal plan of care) Evaluation of current treatment plan related to  self management and patient's adherence to plan as established by provider Discussed family/social support Discussed mobility and ability to perform ADLs   SDOH Barriers (Status: Goal on Track (progressing): YES.) Short Term Goal  Patient interviewed and SDOH assessment performed        SDOH Interventions    Flowsheet Row Most Recent Value  SDOH Interventions   Financial Strain Interventions Other (Comment)  [assisted with Medicare Extra Help Application]  Social Connections Interventions Intervention Not Indicated     Provided patient with information about Medicare Extra Help for Prescription Drug Coverage (LIS) and the limitations for Myrbetriq manufacturer assistance Discussed plans with patient for ongoing care management follow up and provided patient with direct contact information for care management team Advised patient to call RN Care Manager [phone] when she receives her decision letter from [place] regarding Extra Help Assisted patient/caregiver with obtaining information about health plan benefits Assisted patient with completion of online Medicare Extra Help application Therapeutic listening utilized regarding health related anxiety  and financial constraints   Patient Goals/Self-Care Activities: Perform IADL's (shopping, preparing meals, housekeeping, managing finances) independently Call provider office for new concerns or questions  Call [place] when you receive the decision letter [phone]  Plan:Telephone follow up appointment with care management team member scheduled for:  02/16/21 with RNCM The patient has been provided with contact information for the care management team and  has been advised to call with any health related questions or concerns.   Chong Sicilian, BSN, RN-BC Embedded Chronic Care Manager [facility] [place] Management Direct Dial: [phone]

## 2021-01-14 NOTE — Patient Instructions (Signed)
Visit Information  Patient Goals/Self-Care Activities: Perform IADL's (shopping, preparing meals, housekeeping, managing finances) independently Call provider office for new concerns or questions  Call [place] when you receive the decision letter [phone]  Patient verbalizes understanding of instructions and care plan provided today and agrees to view in [place]. Active MyChart status confirmed with patient.    Plan:Telephone follow up appointment with care management team member scheduled for:  02/16/21 with RNCM The patient has been provided with contact information for the care management team and has been advised to call with any health related questions or concerns.   Chong Sicilian, BSN, RN-BC Embedded Chronic Care Manager [facility] [place] Management Direct Dial: [phone]

## 2021-01-15 ENCOUNTER — Ambulatory Visit: Payer: Medicare Other | Admitting: Nurse Practitioner

## 2021-01-15 NOTE — Telephone Encounter (Signed)
[name], her nuclear stress test was negative.   The study is normal. The study is low risk.   No ST deviation was noted. The ECG was negative for ischemia.   LV perfusion is normal.  Evidence of breast attenuation artifact with no definitive perfusion defect to suggest scar or ischemia.   Left ventricular function is normal. Nuclear stress EF: 59 %.  Low risk study with no clear evidence of scar or ischemia and normal LVEF at 59%.  So keep her EGD 01/22/2021 as scheduled.  I sent a message to her cardiologist Dr. Percival Spanish to inquire if she requires any further cardiac testing prior to her EGD. I will let you know his response.

## 2021-01-15 NOTE — Telephone Encounter (Signed)
Colleen,  Do we need to contact the patient and cancel her procedure until we can obtain the cardiac clearance? Patient saw Cardiology and have the stress test but I can not get a response of if she can proceed.

## 2021-01-16 ENCOUNTER — Ambulatory Visit (HOSPITAL_COMMUNITY)
Admission: RE | Admit: 2021-01-16 | Discharge: 2021-01-16 | Disposition: A | Discharge status: Home / Self Care | Payer: Medicare Other | Source: Ambulatory Visit | Attending: Nurse Practitioner | Admitting: Nurse Practitioner

## 2021-01-16 DIAGNOSIS — K922 Gastrointestinal hemorrhage, unspecified: Secondary | ICD-10-CM | POA: Diagnosis not present

## 2021-01-16 DIAGNOSIS — K921 Melena: Secondary | ICD-10-CM | POA: Insufficient documentation

## 2021-01-16 DIAGNOSIS — K625 Hemorrhage of anus and rectum: Secondary | ICD-10-CM | POA: Insufficient documentation

## 2021-01-16 DIAGNOSIS — R531 Weakness: Secondary | ICD-10-CM | POA: Insufficient documentation

## 2021-01-16 DIAGNOSIS — R112 Nausea with vomiting, unspecified: Secondary | ICD-10-CM | POA: Insufficient documentation

## 2021-01-16 DIAGNOSIS — I700 Atherosclerosis of aorta: Secondary | ICD-10-CM | POA: Diagnosis not present

## 2021-01-16 DIAGNOSIS — R109 Unspecified abdominal pain: Secondary | ICD-10-CM | POA: Diagnosis not present

## 2021-01-16 DIAGNOSIS — R1110 Vomiting, unspecified: Secondary | ICD-10-CM | POA: Diagnosis not present

## 2021-01-16 MED ORDER — SODIUM CHLORIDE (PF) 0.9 % IJ SOLN
INTRAMUSCULAR | Status: AC
  Filled 2021-01-16: qty 50

## 2021-01-16 MED ORDER — IOHEXOL 300 MG/ML  SOLN
100.0000 mL | Freq: Once | INTRAMUSCULAR | Status: AC | PRN
  Administered 2021-01-16: 100 mL via INTRAVENOUS

## 2021-01-16 NOTE — Telephone Encounter (Signed)
Yes,  patient is cleared by cardiology to proceed with her EGD with Dr. Hilarie Fredrickson as scheduled.  Melissa, thank you for your excellent work wre cardiac clearance follow-up.

## 2021-01-19 ENCOUNTER — Encounter: Payer: Self-pay | Admitting: *Deleted

## 2021-01-20 ENCOUNTER — Ambulatory Visit: Payer: Medicare Other | Attending: Cardiology | Admitting: Cardiovascular Disease

## 2021-01-20 DIAGNOSIS — G4733 Obstructive sleep apnea (adult) (pediatric): Secondary | ICD-10-CM | POA: Insufficient documentation

## 2021-01-20 DIAGNOSIS — G4734 Idiopathic sleep related nonobstructive alveolar hypoventilation: Secondary | ICD-10-CM | POA: Insufficient documentation

## 2021-01-20 DIAGNOSIS — G4736 Sleep related hypoventilation in conditions classified elsewhere: Secondary | ICD-10-CM | POA: Diagnosis not present

## 2021-01-20 HISTORY — DX: Obstructive sleep apnea (adult) (pediatric): G47.33

## 2021-01-20 NOTE — Telephone Encounter (Signed)
Pt.notified

## 2021-01-22 ENCOUNTER — Ambulatory Visit (AMBULATORY_SURGERY_CENTER): Payer: Medicare Other | Admitting: Internal Medicine

## 2021-01-22 ENCOUNTER — Encounter: Payer: Self-pay | Admitting: Internal Medicine

## 2021-01-22 VITALS — BP 151/76 | HR 76 | Temp 97.1°F | Resp 17 | Ht 69.0 in | Wt 163.0 lb

## 2021-01-22 DIAGNOSIS — Z8719 Personal history of other diseases of the digestive system: Secondary | ICD-10-CM

## 2021-01-22 DIAGNOSIS — K31A0 Gastric intestinal metaplasia, unspecified: Secondary | ICD-10-CM

## 2021-01-22 DIAGNOSIS — K921 Melena: Secondary | ICD-10-CM

## 2021-01-22 DIAGNOSIS — K2970 Gastritis, unspecified, without bleeding: Secondary | ICD-10-CM

## 2021-01-22 DIAGNOSIS — K2960 Other gastritis without bleeding: Secondary | ICD-10-CM | POA: Diagnosis not present

## 2021-01-22 DIAGNOSIS — I10 Essential (primary) hypertension: Secondary | ICD-10-CM | POA: Diagnosis not present

## 2021-01-22 DIAGNOSIS — R112 Nausea with vomiting, unspecified: Secondary | ICD-10-CM

## 2021-01-22 DIAGNOSIS — K2951 Unspecified chronic gastritis with bleeding: Secondary | ICD-10-CM | POA: Diagnosis not present

## 2021-01-22 DIAGNOSIS — K2941 Chronic atrophic gastritis with bleeding: Secondary | ICD-10-CM | POA: Diagnosis not present

## 2021-01-22 MED ORDER — FLUCONAZOLE 100 MG PO TABS: 100.0000 mg | ORAL_TABLET | Freq: Every day | ORAL | 0 refills | Status: DC

## 2021-01-22 MED ORDER — SODIUM CHLORIDE 0.9 % IV SOLN: 500.0000 mL | Freq: Once | INTRAVENOUS | Status: DC

## 2021-01-22 NOTE — Patient Instructions (Signed)
YOU HAD AN ENDOSCOPIC PROCEDURE TODAY AT THE [facility]:   Refer to the procedure report that was given to you for any specific questions about what was found during the examination.  If the procedure report does not answer your questions, please call your gastroenterologist to clarify.  If you requested that your care partner not be given the details of your procedure findings, then the procedure report has been included in a sealed envelope for you to review at your convenience later.  YOU SHOULD EXPECT: Some feelings of bloating in the abdomen. Passage of more gas than usual.  Walking can help get rid of the air that was put into your GI tract during the procedure and reduce the bloating. If you had a lower endoscopy (such as a colonoscopy or flexible sigmoidoscopy) you may notice spotting of blood in your stool or on the toilet paper. If you underwent a bowel prep for your procedure, you may not have a normal bowel movement for a few days.  Please Note:  You might notice some irritation and congestion in your nose or some drainage.  This is from the oxygen used during your procedure.  There is no need for concern and it should clear up in a day or so.  SYMPTOMS TO REPORT IMMEDIATELY:    Following upper endoscopy (EGD)  Vomiting of blood or coffee ground material  New chest pain or pain under the shoulder blades  Painful or persistently difficult swallowing  New shortness of breath  Fever of 100F or higher  Black, tarry-looking stools  For urgent or emergent issues, a gastroenterologist can be reached at any hour by calling [phone]. Do not use MyChart messaging for urgent concerns.    DIET:  We do recommend a small meal at first, but then you may proceed to your regular diet.  Drink plenty of fluids but you should avoid alcoholic beverages for 24 hours.  ACTIVITY:  You should plan to take it easy for the rest of today and you should NOT DRIVE or use heavy machinery  until tomorrow (because of the sedation medicines used during the test).    FOLLOW UP: Our staff will call the number listed on your records 48-72 hours following your procedure to check on you and address any questions or concerns that you may have regarding the information given to you following your procedure. If we do not reach you, we will leave a message.  We will attempt to reach you two times.  During this call, we will ask if you have developed any symptoms of COVID 19. If you develop any symptoms (ie: fever, flu-like symptoms, shortness of breath, cough etc.) before then, please call [phone].  If you test positive for Covid 19 in the 2 weeks post procedure, please call and report this information to us.    If any biopsies were taken you will be contacted by phone or by letter within the next 1-3 weeks.  Please call us at [phone] if you have not heard about the biopsies in 3 weeks.    SIGNATURES/CONFIDENTIALITY: You and/or your care partner have signed paperwork which will be entered into your electronic medical record.  These signatures attest to the fact that that the information above on your After Visit Summary has been reviewed and is understood.  Full responsibility of the confidentiality of this discharge information lies with you and/or your care-partner. 

## 2021-01-22 NOTE — Progress Notes (Signed)
For EGD today. See office note dated 01/02/2021 from Carl Best, NP for details. She remains appropriate for upper endoscopy in the outpatient ambulatory setting. No significant changes in medical history since office visit.

## 2021-01-22 NOTE — Op Note (Signed)
[place] Patient Name: [name] Procedure Date: 01/22/2021 9:51 AM MRN: [id number] Endoscopist: Jerene Bears , MD Age: 81 Referring MD:  Date of Birth: 09/04/1940 Gender: Female Account #: [id number] Procedure:                Upper GI endoscopy Indications:              Nausea with vomiting, previous melena (none                            recently), personal history of gastric adenoma                            removed by hot snare (07/2018) Medicines:                Monitored Anesthesia Care Procedure:                Pre-Anesthesia Assessment:                           - Prior to the procedure, a History and Physical                            was performed, and patient medications and                            allergies were reviewed. The patient's tolerance of                            previous anesthesia was also reviewed. The risks                            and benefits of the procedure and the sedation                            options and risks were discussed with the patient.                            All questions were answered, and informed consent                            was obtained. Prior Anticoagulants: The patient has                            taken no previous anticoagulant or antiplatelet                            agents. ASA Grade Assessment: II - A patient with                            mild systemic disease. After reviewing the risks                            and benefits, the patient was deemed in  satisfactory condition to undergo the procedure.                           After obtaining informed consent, the endoscope was                            passed under direct vision. Throughout the                            procedure, the patient's blood pressure, pulse, and                            oxygen saturations were monitored continuously. The                            Olympus Endoscope [id number] was  introduced through                            the mouth, and advanced to the second part of                            duodenum. The upper GI endoscopy was accomplished                            without difficulty. The patient tolerated the                            procedure well. Scope In: Scope Out: Findings:                 Patchy, white plaques were found in the upper third                            of the esophagus and in the middle third of the                            esophagus.                           The exam of the esophagus was otherwise normal.                           Localized moderate inflammation characterized by                            erythema was found in the gastric antrum (query                            mild GAVE). Biopsies were taken with a cold forceps                            for histology.                           Diffuse atrophic mucosa was found in the gastric  fundus and in the gastric body. Biopsies were taken                            with a cold forceps for histology. No gastric                            polyps seen.                           The examined duodenum was normal. Complications:            No immediate complications. Estimated Blood Loss:     Estimated blood loss was minimal. Impression:               - Esophageal plaques were found, consistent with                            candidiasis.                           - Gastritis (possible GAVE). Biopsied.                           - Gastric mucosal atrophy. Biopsied.                           - No evidence of recurrent gastric polyps.                           - Normal examined duodenum. Recommendation:           - Patient has a contact number available for                            emergencies. The signs and symptoms of potential                            delayed complications were discussed with the                            patient. Return to  normal activities tomorrow.                            Written discharge instructions were provided to the                            patient.                           - Resume previous diet.                           - Continue present medications.                           - Fluconazole 200 mg x 1 day, 100 mg x 13 days for  Candida esophagitis.                           - Await pathology results.                           - Begin MiraLax 17 g daily for mild constipation                            associated with lower abdominal pain. If this                            continues to be a problem please contact us for a                            follow-up office visit. Jerene Bears, MD 01/22/2021 10:12:25 AM This report has been signed electronically.

## 2021-01-22 NOTE — Progress Notes (Signed)
Called to room to assist during endoscopic procedure.  Patient ID and intended procedure confirmed with present staff. Received instructions for my participation in the procedure from the performing physician.  

## 2021-01-22 NOTE — Progress Notes (Signed)
DT - VS  No Sticks on LEFT side - sign hanging on IV pole.  Pt fell in Sept 2022 - broke 3 bones in right foot. Pt December 2022 and sprained Left ankle.    Fall risk - sign hanging IV pole  Pt has had nausea from anesthesia in the past.

## 2021-01-22 NOTE — Progress Notes (Signed)
Report given to PACU, vss 

## 2021-01-22 NOTE — Progress Notes (Signed)
0955 Robinul 0.1 mg IV given due large amount of secretions upon assessment.  MD made aware, vss

## 2021-01-26 ENCOUNTER — Telehealth: Payer: Self-pay

## 2021-01-26 NOTE — Telephone Encounter (Signed)
°  Follow up Call-  Call back number 01/22/2021 05/14/2019 08/03/2018  Post procedure Call Back phone  # [phone]  Permission to leave phone message Yes Yes Yes  Some recent data might be hidden     Patient questions:  Do you have a fever, pain , or abdominal swelling? No. Pain Score  0 *  Have you tolerated food without any problems? Yes.    Have you been able to return to your normal activities? Yes.    Do you have any questions about your discharge instructions: Diet   No. Medications  No. Follow up visit  No.  Do you have questions or concerns about your Care? No.  Actions: * If pain score is 4 or above: No action needed, pain <4.

## 2021-01-28 ENCOUNTER — Encounter: Payer: Self-pay | Admitting: Internal Medicine

## 2021-02-03 DIAGNOSIS — I10 Essential (primary) hypertension: Secondary | ICD-10-CM

## 2021-02-11 ENCOUNTER — Ambulatory Visit: Payer: Medicare Other | Admitting: Cardiology

## 2021-02-11 ENCOUNTER — Encounter: Payer: Self-pay | Admitting: *Deleted

## 2021-02-13 ENCOUNTER — Other Ambulatory Visit: Payer: Self-pay

## 2021-02-13 MED ORDER — PANTOPRAZOLE SODIUM 20 MG PO TBEC: 20.0000 mg | DELAYED_RELEASE_TABLET | Freq: Every day | ORAL | 1 refills | Status: DC

## 2021-02-15 ENCOUNTER — Encounter: Payer: Self-pay | Admitting: Cardiovascular Disease

## 2021-02-15 NOTE — Procedures (Signed)
[facility]    Patient Name: [name], [name] Date: 01/20/2021 Gender: Female D.O.B: 26-Apr-1940 Age (years): 65 Referring Provider: Shelva Majestic MD, ABSM Height (inches): 69 Interpreting Physician: Shelva Majestic MD, ABSM Weight (lbs): 160 RPSGT: Peak, Robert BMI: 24 MRN: [id number] Neck Size: 15.50  CLINICAL INFORMATION Sleep Study Type: HST  Indication for sleep study: OSA, daytime sleepiness, snoring, fatigue  Epworth Sleepiness Score: 13  SLEEP STUDY TECHNIQUE A multi-channel overnight portable sleep study was performed. The channels recorded were: nasal airflow, thoracic respiratory movement, and oxygen saturation with a pulse oximetry. Snoring was also monitored.  MEDICATIONS albuterol (VENTOLIN HFA) 108 (90 Base) MCG/ACT inhaler aspirin 81 MG tablet busPIRone (BUSPAR) 30 MG tablet Calcium Carbonate-Vitamin D 500-125 MG-UNIT TABS Cyanocobalamin (VITAMIN B 12 PO) Diclofenac Sodium 3 % GEL dimenhyDRINATE (DRAMAMINE) 50 MG tablet donepezil (ARICEPT) 10 MG tablet ezetimibe (ZETIA) 10 MG tablet fluconazole (DIFLUCAN) 100 MG tablet fluticasone (FLONASE) 50 MCG/ACT nasal spray gabapentin (NEURONTIN) 300 MG capsule hydrochlorothiazide (HYDRODIURIL) 12.5 MG tablet ibuprofen (ADVIL,MOTRIN) 200 MG tablet levothyroxine (SYNTHROID) 125 MCG tablet lisinopril (ZESTRIL) 2.5 MG tablet memantine (NAMENDA) 5 MG tablet mirabegron ER (MYRBETRIQ) 25 MG TB24 tablet Multiple Vitamin (MULTIVITAMIN) capsule Omega-3 Fatty Acids (FISH OIL) 1000 MG CAPS pantoprazole (PROTONIX) 20 MG tablet triamcinolone ointment (KENALOG) 0.5 % Patient self administered medications include: GABAPENTIN, ARICEPT, MEMANTINE, TYLENOL PM, HCTZ, LISINOPRIL.  SLEEP ARCHITECTURE Patient was studied for 472.4 minutes. The sleep efficiency was 98.4 % and the patient was supine for 88.7%. The arousal  index was 0.0 per hour.  RESPIRATORY PARAMETERS The overall AHI was 28.7 per hour, with a central apnea index of 0 per hour.  The oxygen nadir was 73% during sleep. Time spent < 89% was 34.1 minutes.  CARDIAC DATA Mean heart rate during sleep was 66.7 bpm.  IMPRESSIONS - Moderate obstructive sleep apnea occurred during this study (AHI 28.7/h). There is a significant positional component with supine sleep (421.8 mintes) AHI 32.2/h versus non-supine sleep (56.1 minutes) AHI 1.1/h. - Severe oxygen desaturation to a nadir of 73%. Time spent < 73 minutes was 34.1 minutes. - Patient snored for 112.2 minutes (23.8%) during the sleep.  DIAGNOSIS - Obstructive Sleep Apnea (G47.33) - Nocturnal Hypoxemia (G47.36)  RECOMMENDATIONS - Recommend therapeutic CPAP therapy with initiation of Auto- PAP with EPR of 3 at 7 - 18 cm of water. - Effort should be made to optimize nasal and oropharyngeal patency. - The patient should be counseled to avoid supine sleep; consider positional therapy. - Avoid alcohol, sedatives and other CNS depressants that may worsen sleep apnea and disrupt normal sleep architecture. - Sleep hygiene should be reviewed to assess factors that may improve sleep quality. - Weight management and regular exercise should be initiated or continued. - Recommend a download and sleep clinic evaluaiton after one month of therapy.  [Electronically signed] 02/15/2021 02:54 PM  Shelva Majestic MD,  [facility], ABSM Diplomate, American Board of Sleep Medicine   NPI: [id number]

## 2021-02-16 ENCOUNTER — Ambulatory Visit (INDEPENDENT_AMBULATORY_CARE_PROVIDER_SITE_OTHER): Payer: Medicare Other | Admitting: *Deleted

## 2021-02-16 DIAGNOSIS — R413 Other amnesia: Secondary | ICD-10-CM

## 2021-02-16 DIAGNOSIS — G4733 Obstructive sleep apnea (adult) (pediatric): Secondary | ICD-10-CM

## 2021-02-16 DIAGNOSIS — G4734 Idiopathic sleep related nonobstructive alveolar hypoventilation: Secondary | ICD-10-CM

## 2021-02-16 DIAGNOSIS — K219 Gastro-esophageal reflux disease without esophagitis: Secondary | ICD-10-CM

## 2021-02-16 DIAGNOSIS — F411 Generalized anxiety disorder: Secondary | ICD-10-CM

## 2021-02-16 DIAGNOSIS — I10 Essential (primary) hypertension: Secondary | ICD-10-CM

## 2021-02-17 ENCOUNTER — Telehealth: Payer: Self-pay | Admitting: *Deleted

## 2021-02-17 NOTE — Telephone Encounter (Signed)
-----   Message from Troy Sine, MD sent at 02/15/2021  3:05 PM EST ----- [name], please notify patient and set up with DME to initiate Auto-PAP;

## 2021-02-17 NOTE — Telephone Encounter (Signed)
Left message to return a call to me to discuss sleep study results and recommendations.

## 2021-02-18 NOTE — Telephone Encounter (Signed)
Patient returned a call to me and was given HST result and recommendations. APAP order has been sent to [facility] in [place].

## 2021-03-03 DIAGNOSIS — I10 Essential (primary) hypertension: Secondary | ICD-10-CM | POA: Diagnosis not present

## 2021-03-12 ENCOUNTER — Other Ambulatory Visit (HOSPITAL_COMMUNITY): Payer: Self-pay | Admitting: Family Medicine

## 2021-03-12 DIAGNOSIS — Z1231 Encounter for screening mammogram for malignant neoplasm of breast: Secondary | ICD-10-CM

## 2021-03-16 ENCOUNTER — Telehealth: Payer: Medicare Other | Admitting: *Deleted

## 2021-03-16 ENCOUNTER — Telehealth: Payer: Self-pay | Admitting: *Deleted

## 2021-03-16 NOTE — Chronic Care Management (AMB) (Signed)
Chronic Care Management   CCM RN Visit Note  02/16/2021 Name: [name] MRN: [id number] DOB: 04-03-1940  Subjective: [name] is a 81 y.o. year old female who is a primary care patient of Dettinger, Fransisca Kaufmann, MD. The care management team was consulted for assistance with disease management and care coordination needs.    Engaged with patient by telephone for follow up visit in response to provider referral for case management and/or care coordination services.   Consent to Services:  The patient was given information about Chronic Care Management services, agreed to services, and gave verbal consent prior to initiation of services.  Please see initial visit note for detailed documentation.   Patient agreed to services and verbal consent obtained.   Assessment: Review of patient past medical history, allergies, medications, health status, including review of consultants reports, laboratory and other test data, was performed as part of comprehensive evaluation and provision of chronic care management services.   SDOH (Social Determinants of Health) assessments and interventions performed:    CCM Care Plan  Allergies  Allergen Reactions   Atorvastatin Other (See Comments)    Muscle cramps   Meperidine Hcl Nausea And Vomiting    Makes me 'deathly' sick   Statins Other (See Comments)    REACTION: Patient cannot tolerate due cramps   Norco [Hydrocodone-Acetaminophen] Nausea And Vomiting   Sulfonamide Derivatives Other (See Comments)    Unknown reaction    Outpatient Encounter Medications as of 02/16/2021  Medication Sig   albuterol (VENTOLIN HFA) 108 (90 Base) MCG/ACT inhaler Inhale 2 puffs into the lungs every 6 (six) hours as needed for wheezing or shortness of breath.   aspirin 81 MG tablet Take 81 mg by mouth daily.   busPIRone (BUSPAR) 30 MG tablet Take 0.5-1 tablets (15-30 mg total) by mouth daily as needed. TAKE (1/2) TO (1) TABLET BY MOUTH ONCE DAILY AS NEEDED    Calcium Carbonate-Vitamin D 500-125 MG-UNIT TABS Take 1 capsule by mouth daily.   Cyanocobalamin (VITAMIN B 12 PO) Take 1 tablet by mouth daily.    Diclofenac Sodium 3 % GEL Apply 1 g topically 2 (two) times daily as needed.   dimenhyDRINATE (DRAMAMINE) 50 MG tablet Take 50 mg by mouth every 8 (eight) hours as needed.   donepezil (ARICEPT) 10 MG tablet TAKE 1 TABLET BY MOUTH AT BEDTIME.   ezetimibe (ZETIA) 10 MG tablet Take 1 tablet (10 mg total) by mouth daily.   fluconazole (DIFLUCAN) 100 MG tablet Take 1 tablet (100 mg total) by mouth daily.   fluticasone (FLONASE) 50 MCG/ACT nasal spray Place 2 sprays into both nostrils daily.   gabapentin (NEURONTIN) 300 MG capsule TAKE 1 CAPSULE EACH MORNING AND 2 AT BEDTIME.   hydrochlorothiazide (HYDRODIURIL) 12.5 MG tablet Take 1 tablet (12.5 mg total) by mouth daily.   ibuprofen (ADVIL,MOTRIN) 200 MG tablet Take 800 mg by mouth every 6 (six) hours as needed (Pain).    levothyroxine (SYNTHROID) 125 MCG tablet Take 1 tablet (125 mcg total) by mouth daily.   lisinopril (ZESTRIL) 2.5 MG tablet Take 1 tablet (2.5 mg total) by mouth daily.   memantine (NAMENDA) 5 MG tablet Take 1 tablet (5 mg total) by mouth 2 (two) times daily.   mirabegron ER (MYRBETRIQ) 25 MG TB24 tablet Take 1 tablet (25 mg total) by mouth daily.   Multiple Vitamin (MULTIVITAMIN) capsule Take 1 capsule by mouth daily.   Omega-3 Fatty Acids (FISH OIL) 1000 MG CAPS Take 1,000 mg by mouth daily.  ondansetron (ZOFRAN) 4 MG tablet Take 1 tablet (4 mg total) by mouth every 8 (eight) hours as needed for nausea or vomiting.   pantoprazole (PROTONIX) 20 MG tablet Take 1 tablet (20 mg total) by mouth daily.   triamcinolone ointment (KENALOG) 0.5 % Apply 1 application topically 2 (two) times daily.   No facility-administered encounter medications on file as of 02/16/2021.    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 02/28/2014 02/28/2014   Polyneuropathy in other diseases classified elsewhere ([place]) 09/24/2013   Memory disorder 09/24/2013   Hypothyroidism, postsurgical 05/15/2013   HTN (hypertension) 05/15/2013   Vitamin D deficiency 05/15/2013   Hyperlipidemia 05/15/2013   Osteopenia 10/25/2012   Trigeminal neuralgia 04/02/2011   GERD (gastroesophageal reflux disease) 07/23/2010   Anxiety state 05/24/2008   COPD (chronic obstructive pulmonary disease) ([place]) 05/24/2008   ARTHRITIS 05/24/2008   Idiopathic osteoporosis 05/24/2008   Malignant neoplasm of upper-outer quadrant of left female breast ([place]) 05/22/2008   COLONIC POLYPS, ADENOMATOUS, HX OF 05/22/2008    Conditions to be addressed/monitored:HTN, COPD, and sleep apnea  Care Plan : [facility] Care Plan     Problem: Chronic Disease Management Needs   Priority: High  Onset Date: 12/05/2020     Long-Range Goal: Patient will Work with RN Care Manager to Develop a Plan of Care Regarding Chronic Care Management and Care Coordination Associated with HTN, hypothyroidism, arthritis, anxiety, Moderate obstructive sleep apnea, nocturnal hypoxemia, COPD   Start Date: 12/05/2020  Expected End Date: 12/05/2021  This Visit's Progress: On track  Recent Progress: On track  Priority: High  Note:   Current Barriers:  Financial constraints related to cost of medications  RNCM Clinical Goal(s):  Patient will  complete applications for prescription assistance  through collaboration with RN Care manager, provider, and care team.  Patient will work with RN Care Manager regarding Care Coordination and Care Management associated with  hypertension, overactive bladder, hypothyroidism  Interventions: 1:1 collaboration with primary care provider regarding development and update of comprehensive plan of care as evidenced by provider attestation and co-signature Inter-disciplinary care team collaboration (see longitudinal plan of care) Evaluation of current treatment plan related to  self management and patient's adherence to plan as established by provider Discussed family/social support Daughter lives locally and helps out. Son lives a couple hours away but calls and visits when he can. Discussed mobility and ability to perform ADLs   SDOH Barriers (Status: Goal Not Met.) Short Term Goal  Patient interviewed and SDOH assessment performed        SDOH Interventions    Flowsheet Row Most Recent Value  SDOH Interventions   Financial Strain Interventions Other (Comment)  [assisted with Medicare Extra Help Application]  Social Connections Interventions Intervention Not Indicated     Provided patient with information about Medicare Extra Help for Prescription Drug Coverage (LIS) and the limitations for Myrbetriq manufacturer assistance Discussed plans with patient for ongoing care management follow up and provided patient with direct contact information for care management team Assisted patient/caregiver with obtaining information about health plan benefits Previously assisted patient with completion of online  Medicare Extra Help application Therapeutic listening utilized regarding health related anxiety and financial constraints Discussed Medicare determination letter. She did not qualify for Medicare Extra Help (LIS).    Hypertension: (Status: Goal on Track (progressing): YES.) Long Term Goal  Last practice recorded BP readings:  BP Readings from Last 3 Encounters:  01/22/21 (!) 151/76  01/07/21 128/60  01/02/21 (!) 116/58  Most recent eGFR/CrCl:  Lab Results  Component Value Date   EGFR 90 01/01/2021    No  components found for: CRCL  Evaluation of current treatment plan related to hypertension self management and patient's adherence to plan as established by provider;   Reviewed medications with patient and discussed importance of compliance;  Counseled on the importance of exercise goals with target of 150 minutes per week Discussed plans with patient for ongoing care management follow up and provided patient with direct contact information for care management team; Advised patient, providing education and rationale, to monitor blood pressure daily and record, calling PCP for findings outside established parameters;  Discussed complications of poorly controlled blood pressure such as heart disease, stroke, circulatory complications, vision complications, kidney impairment, sexual dysfunction;  Assessed social determinant of health barriers;     Sleep Apnea/Nocturnal Hypoxemia:  (Status: New goal.) Long Term Goal  Reviewed and discussed sleep study resylts Discussed treatment recommendations CPAP with auto titration Avoid sleeping flat on back Avoid CNS depressants, like alcohol Reviewed upcoming appointments Verbal education provided on sleep hygiene Encouraged to follow-up with sleep lab as planned   Patient Goals/Self-Care Activities: Perform IADL's (shopping, preparing meals, housekeeping, managing finances) independently Call provider office for new concerns or questions  Call [facility] when you receive the decision letter [phone] Use CPAP as instructed Practice good sleep hygiene Follow-up with sleep lab as instructed Check and record blood pressure several times a wee Call PCP with any readings outside of recommended range  Plan:Telephone follow up appointment with care management team member scheduled for:  03/16/21 with RNCM The patient has been provided with contact information for the care management team and has been advised to call with any health related  questions or concerns.   Chong Sicilian, BSN, RN-BC Embedded Chronic Care Manager [facility] [place] [facility] Direct Dial: [phone]

## 2021-03-16 NOTE — Patient Instructions (Signed)
Visit Information    Patient Goals/Self-Care Activities: Perform IADL's (shopping, preparing meals, housekeeping, managing finances) independently Call provider office for new concerns or questions  Call [facility] when you receive the decision letter [phone] Use CPAP as instructed Practice good sleep hygiene Follow-up with sleep lab as instructed Check and record blood pressure several times a wee Call PCP with any readings outside of recommended range   Patient verbalizes understanding of instructions and care plan provided today and agrees to view in [place]. Active MyChart status confirmed with patient.    Plan:Telephone follow up appointment with care management team member scheduled for:  03/16/21 with RNCM The patient has been provided with contact information for the care management team and has been advised to call with any health related questions or concerns.    Chong Sicilian, BSN, RN-BC Embedded Chronic Care Manager [facility] Direct Dial: [phone]

## 2021-03-23 NOTE — Telephone Encounter (Signed)
?  Care Management  ? ?Follow Up Note ? ? ?03/16/2021 ?Name: [name] MRN: [id number] DOB: 05/31/1940 ? ? ?Referred by: Dettinger, Fransisca Kaufmann, MD ?Reason for referral : Chronic Care Management (Unsuccessful telephone follow-up) ? ? ?An unsuccessful telephone outreach was attempted today. The patient was referred to the case management team for assistance with care management and care coordination.  ? ?Follow Up Plan: A HIPPA compliant phone message was left for the patient providing contact information and requesting a return call. Forwarding to Care Guide for outreach and rescheduling. ? ?Chong Sicilian, BSN, RN-BC ?Embedded Chronic Care Manager ?[place] [facility] [place] [facility] ?Direct Dial: [phone] ? ? ?

## 2021-04-03 ENCOUNTER — Encounter: Payer: Self-pay | Admitting: Family Medicine

## 2021-04-03 ENCOUNTER — Ambulatory Visit (INDEPENDENT_AMBULATORY_CARE_PROVIDER_SITE_OTHER): Payer: Medicare Other | Admitting: Family Medicine

## 2021-04-03 VITALS — BP 120/61 | HR 59 | Ht 69.0 in | Wt 160.0 lb

## 2021-04-03 DIAGNOSIS — Z23 Encounter for immunization: Secondary | ICD-10-CM | POA: Diagnosis not present

## 2021-04-03 DIAGNOSIS — R413 Other amnesia: Secondary | ICD-10-CM | POA: Diagnosis not present

## 2021-04-03 DIAGNOSIS — E7800 Pure hypercholesterolemia, unspecified: Secondary | ICD-10-CM | POA: Diagnosis not present

## 2021-04-03 DIAGNOSIS — E890 Postprocedural hypothyroidism: Secondary | ICD-10-CM | POA: Diagnosis not present

## 2021-04-03 DIAGNOSIS — I10 Essential (primary) hypertension: Secondary | ICD-10-CM

## 2021-04-03 MED ORDER — LISINOPRIL 2.5 MG PO TABS: 2.5000 mg | ORAL_TABLET | Freq: Every day | ORAL | 3 refills | Status: DC

## 2021-04-03 MED ORDER — LEVOTHYROXINE SODIUM 125 MCG PO TABS: 125.0000 ug | ORAL_TABLET | Freq: Every day | ORAL | 3 refills | Status: DC

## 2021-04-03 MED ORDER — PANTOPRAZOLE SODIUM 20 MG PO TBEC: 20.0000 mg | DELAYED_RELEASE_TABLET | Freq: Every day | ORAL | 1 refills | Status: DC

## 2021-04-03 MED ORDER — MEMANTINE HCL 5 MG PO TABS: 5.0000 mg | ORAL_TABLET | Freq: Two times a day (BID) | ORAL | 3 refills | Status: DC

## 2021-04-03 MED ORDER — EZETIMIBE 10 MG PO TABS: 10.0000 mg | ORAL_TABLET | Freq: Every day | ORAL | 3 refills | Status: DC

## 2021-04-03 MED ORDER — HYDROCHLOROTHIAZIDE 12.5 MG PO TABS: 12.5000 mg | ORAL_TABLET | Freq: Every day | ORAL | 3 refills | Status: DC

## 2021-04-03 NOTE — Patient Instructions (Signed)
Stop hydrochlorothiazide for at least a month and let me know how the blood pressures do in the fluid does ?

## 2021-04-03 NOTE — Progress Notes (Signed)
? ?BP 120/61   Pulse (!) 59   Ht '5\' 9"'  (1.753 m)   Wt 160 lb (72.6 kg)   LMP  (LMP Unknown)   SpO2 98%   BMI 23.63 kg/m?   ? ?Subjective:  ? ?Patient ID: [name], female    DOB: 1940-08-23, 81 y.o.   MRN: [id number] ? ?HPI: ?[name] is a 81 y.o. female presenting on 04/03/2021 for Medical Management of Chronic Issues and Hypothyroidism ? ? ?HPI ?Hypertension ?Patient is currently on hydrochlorothiazide 12.5 mg and lisinopril 2.5 mg, and their blood pressure today is 120/61. Patient she does complain of some lightheadedness and dizziness especially when she stands up and she had a fall last December and she thinks that was because she was lightheaded or dizzy.. Patient denies headaches, blurred vision, chest pains, shortness of breath, or weakness. Denies any side effects from medication and is content with current medication.  ? ?Hypothyroidism recheck ?Patient is coming in for thyroid recheck today as well. They deny any issues with hair changes or heat or cold problems or diarrhea or constipation. They deny any chest pain or palpitations. They are currently on levothyroxine 125 micrograms  ? ?Hyperlipidemia ?Patient is coming in for recheck of his hyperlipidemia. The patient is currently taking fish oil. They deny any issues with myalgias or history of liver damage from it. They deny any focal numbness or weakness or chest pain.  ? ?Memory disorder and anxiety ?Patient has memory disorder, likely dementia and anxiety.  She is currently taking buspirone and Aricept and Namenda and they are helping.  She does see neurology. ? ?Relevant past medical, surgical, family and social history reviewed and updated as indicated. Interim medical history since our last visit reviewed. ?Allergies and medications reviewed and updated. ? ?Review of Systems  ?Constitutional:  Negative for chills and fever.  ?HENT:  Negative for congestion, ear discharge and ear pain.   ?Eyes:  Negative for redness and visual  disturbance.  ?Respiratory:  Negative for chest tightness and shortness of breath.   ?Cardiovascular:  Positive for leg swelling. Negative for chest pain.  ?Genitourinary:  Negative for difficulty urinating and dysuria.  ?Musculoskeletal:  Positive for arthralgias (Arthritis, especially in her ankle right now). Negative for back pain and gait problem.  ?Skin:  Negative for rash.  ?Neurological:  Positive for dizziness and light-headedness. Negative for headaches.  ?Psychiatric/Behavioral:  Negative for agitation and behavioral problems.   ?All other systems reviewed and are negative. ? ?Per HPI unless specifically indicated above ? ? ?Allergies as of 04/03/2021   ? ?   Reactions  ? Atorvastatin Other (See Comments)  ? Muscle cramps  ? Meperidine Hcl Nausea And Vomiting  ? Makes me 'deathly' sick  ? Statins Other (See Comments)  ? REACTION: Patient cannot tolerate due cramps  ? Norco [hydrocodone-acetaminophen] Nausea And Vomiting  ? Sulfonamide Derivatives Other (See Comments)  ? Unknown reaction  ? ?  ? ?  ?Medication List  ?  ? ?  ? Accurate as of April 03, 2021  9:21 AM. If you have any questions, ask your nurse or doctor.  ?  ?  ? ?  ? ?STOP taking these medications   ? ?Diclofenac Sodium 3 % Gel ?Stopped by: Worthy Rancher, MD ?  ?ondansetron 4 MG tablet ?Commonly known as: Zofran ?Stopped by: Worthy Rancher, MD ?  ?triamcinolone ointment 0.5 % ?Commonly known as: KENALOG ?Stopped by: Worthy Rancher, MD ?  ? ?  ? ?  TAKE these medications   ? ?albuterol 108 (90 Base) MCG/ACT inhaler ?Commonly known as: VENTOLIN HFA ?Inhale 2 puffs into the lungs every 6 (six) hours as needed for wheezing or shortness of breath. ?  ?aspirin 81 MG tablet ?Take 81 mg by mouth daily. ?  ?busPIRone 30 MG tablet ?Commonly known as: BUSPAR ?Take 0.5-1 tablets (15-30 mg total) by mouth daily as needed. TAKE (1/2) TO (1) TABLET BY MOUTH ONCE DAILY AS NEEDED ?  ?Calcium Carbonate-Vitamin D 500-125 MG-UNIT Tabs ?Take 1 capsule by  mouth daily. ?  ?dimenhyDRINATE 50 MG tablet ?Commonly known as: DRAMAMINE ?Take 50 mg by mouth every 8 (eight) hours as needed. ?  ?donepezil 10 MG tablet ?Commonly known as: ARICEPT ?TAKE 1 TABLET BY MOUTH AT BEDTIME. ?  ?ezetimibe 10 MG tablet ?Commonly known as: ZETIA ?Take 1 tablet (10 mg total) by mouth daily. ?  ?Fish Oil 1000 MG Caps ?Take 1,000 mg by mouth daily. ?  ?fluconazole 100 MG tablet ?Commonly known as: DIFLUCAN ?Take 1 tablet (100 mg total) by mouth daily. ?  ?fluticasone 50 MCG/ACT nasal spray ?Commonly known as: FLONASE ?Place 2 sprays into both nostrils daily. ?  ?gabapentin 300 MG capsule ?Commonly known as: NEURONTIN ?TAKE 1 CAPSULE EACH MORNING AND 2 AT BEDTIME. ?  ?hydrochlorothiazide 12.5 MG tablet ?Commonly known as: HYDRODIURIL ?Take 1 tablet (12.5 mg total) by mouth daily. ?  ?ibuprofen 200 MG tablet ?Commonly known as: ADVIL ?Take 800 mg by mouth every 6 (six) hours as needed (Pain). ?  ?levothyroxine 125 MCG tablet ?Commonly known as: SYNTHROID ?Take 1 tablet (125 mcg total) by mouth daily. ?  ?lisinopril 2.5 MG tablet ?Commonly known as: Zestril ?Take 1 tablet (2.5 mg total) by mouth daily. ?  ?memantine 5 MG tablet ?Commonly known as: NAMENDA ?Take 1 tablet (5 mg total) by mouth 2 (two) times daily. ?  ?mirabegron ER 25 MG Tb24 tablet ?Commonly known as: Myrbetriq ?Take 1 tablet (25 mg total) by mouth daily. ?  ?multivitamin capsule ?Take 1 capsule by mouth daily. ?  ?pantoprazole 20 MG tablet ?Commonly known as: PROTONIX ?Take 1 tablet (20 mg total) by mouth daily. ?  ?VITAMIN B 12 PO ?Take 1 tablet by mouth daily. ?  ? ?  ? ? ? ?Objective:  ? ?BP 120/61   Pulse (!) 59   Ht '5\' 9"'  (1.753 m)   Wt 160 lb (72.6 kg)   LMP  (LMP Unknown)   SpO2 98%   BMI 23.63 kg/m?   ?Wt Readings from Last 3 Encounters:  ?04/03/21 160 lb (72.6 kg)  ?01/22/21 163 lb (73.9 kg)  ?01/07/21 160 lb (72.6 kg)  ?  ?Physical Exam ?Vitals and nursing note reviewed.  ?Constitutional:   ?   General: She is  not in acute distress. ?   Appearance: She is well-developed. She is not diaphoretic.  ?Eyes:  ?   Conjunctiva/sclera: Conjunctivae normal.  ?Cardiovascular:  ?   Rate and Rhythm: Normal rate and regular rhythm.  ?   Heart sounds: Normal heart sounds. No murmur heard. ?Pulmonary:  ?   Effort: Pulmonary effort is normal. No respiratory distress.  ?   Breath sounds: Normal breath sounds. No wheezing.  ?Musculoskeletal:     ?   General: Swelling (1+ pitting edema in bilateral lower extremities) present. Normal range of motion.  ?Skin: ?   General: Skin is warm and dry.  ?   Findings: No rash.  ?Neurological:  ?   Mental Status:  She is alert and oriented to person, place, and time.  ?   Coordination: Coordination normal.  ?Psychiatric:     ?   Behavior: Behavior normal.  ? ? ? ? ?Assessment & Plan:  ? ?Problem List Items Addressed This Visit   ? ?  ? Cardiovascular and Mediastinum  ? HTN (hypertension) - Primary  ? Relevant Medications  ? lisinopril (ZESTRIL) 2.5 MG tablet  ? hydrochlorothiazide (HYDRODIURIL) 12.5 MG tablet  ? ezetimibe (ZETIA) 10 MG tablet  ? Other Relevant Orders  ? CBC with Differential/Platelet  ? CMP14+EGFR  ?  ? Endocrine  ? Hypothyroidism, postsurgical  ? Relevant Medications  ? levothyroxine (SYNTHROID) 125 MCG tablet  ? Other Relevant Orders  ? TSH  ?  ? Other  ? Memory disorder (Chronic)  ? Relevant Medications  ? memantine (NAMENDA) 5 MG tablet  ? Hyperlipidemia  ? Relevant Medications  ? lisinopril (ZESTRIL) 2.5 MG tablet  ? hydrochlorothiazide (HYDRODIURIL) 12.5 MG tablet  ? ezetimibe (ZETIA) 10 MG tablet  ? Other Relevant Orders  ? Lipid panel  ?  ?We will have her hold hydrochlorothiazide for at least a month, she is worried about fluid overload but recommended that we stop it for a little while because of low blood pressures and falls and we will see how her fluid does. ?Follow up plan: ?Return in about 6 months (around 10/03/2021) for Hypertension and thyroid recheck. ? ?Counseling  provided for all of the vaccine components ?Orders Placed This Encounter  ?Procedures  ? CBC with Differential/Platelet  ? CMP14+EGFR  ? Lipid panel  ? TSH  ? ? ?Caryl Pina, MD ?[facility] ?04/04/18

## 2021-04-03 NOTE — Addendum Note (Signed)
Addended by: Alphonzo Dublin on: 04/03/2021 03:56 PM ? ? Modules accepted: Orders ? ?

## 2021-04-04 LAB — CBC WITH DIFFERENTIAL/PLATELET
Basophils Absolute: 0 10*3/uL (ref 0.0–0.2)
Basos: 0 %
EOS (ABSOLUTE): 0.4 10*3/uL (ref 0.0–0.4)
Eos: 6 %
Hematocrit: 37.5 % (ref 34.0–46.6)
Hemoglobin: 12.6 g/dL (ref 11.1–15.9)
Immature Grans (Abs): 0 10*3/uL (ref 0.0–0.1)
Immature Granulocytes: 0 %
Lymphocytes Absolute: 1.5 10*3/uL (ref 0.7–3.1)
Lymphs: 25 %
MCH: 30.6 pg (ref 26.6–33.0)
MCHC: 33.6 g/dL (ref 31.5–35.7)
MCV: 91 fL (ref 79–97)
Monocytes Absolute: 0.4 10*3/uL (ref 0.1–0.9)
Monocytes: 7 %
Neutrophils Absolute: 3.6 10*3/uL (ref 1.4–7.0)
Neutrophils: 62 %
Platelets: 242 10*3/uL (ref 150–450)
RBC: 4.12 x10E6/uL (ref 3.77–5.28)
RDW: 13 % (ref 11.7–15.4)
WBC: 5.9 10*3/uL (ref 3.4–10.8)

## 2021-04-04 LAB — LIPID PANEL
Chol/HDL Ratio: 4.4 ratio (ref 0.0–4.4)
Cholesterol, Total: 234 mg/dL — ABNORMAL HIGH (ref 100–199)
HDL: 53 mg/dL (ref >39)
LDL Chol Calc (NIH): 167 mg/dL — ABNORMAL HIGH (ref 0–99)
Triglycerides: 81 mg/dL (ref 0–149)
VLDL Cholesterol Cal: 14 mg/dL (ref 5–40)

## 2021-04-04 LAB — CMP14+EGFR
ALT: 17 IU/L (ref 0–32)
AST: 25 IU/L (ref 0–40)
Albumin/Globulin Ratio: 2.1 (ref 1.2–2.2)
Albumin: 4.5 g/dL (ref 3.7–4.7)
Alkaline Phosphatase: 71 IU/L (ref 44–121)
BUN/Creatinine Ratio: 23 (ref 12–28)
BUN: 16 mg/dL (ref 8–27)
Bilirubin Total: 0.7 mg/dL (ref 0.0–1.2)
CO2: 23 mmol/L (ref 20–29)
Calcium: 9.5 mg/dL (ref 8.7–10.3)
Chloride: 98 mmol/L (ref 96–106)
Creatinine, Ser: 0.7 mg/dL (ref 0.57–1.00)
Globulin, Total: 2.1 g/dL (ref 1.5–4.5)
Glucose: 81 mg/dL (ref 70–99)
Potassium: 4.2 mmol/L (ref 3.5–5.2)
Sodium: 140 mmol/L (ref 134–144)
Total Protein: 6.6 g/dL (ref 6.0–8.5)
eGFR: 87 mL/min/{1.73_m2} (ref >59)

## 2021-04-04 LAB — TSH: TSH: 12.4 u[IU]/mL — ABNORMAL HIGH (ref 0.450–4.500)

## 2021-04-08 ENCOUNTER — Telehealth: Payer: Self-pay

## 2021-04-08 NOTE — Chronic Care Management (AMB) (Signed)
?  Care Management  ? ?Note ? ?04/08/2021 ?Name: Nashya Garlington MRN: [id number] DOB: Nov 22, 1940 ? ?Reanne Nellums is a 81 y.o. year old female who is a primary care patient of Dettinger, Fransisca Kaufmann, MD and is actively engaged with the care management team. I reached out to Thea Gist by phone today to assist with re-scheduling a follow up visit with the RN Case Manager ? ?Follow up plan: ?Unsuccessful telephone outreach attempt made. A HIPAA compliant phone message was left for the patient providing contact information and requesting a return call.  ?The care management team will reach out to the patient again over the next 7 days.  ?If patient returns call to provider office, please advise to call [facility]  at [phone] ? ?Noreene Larsson, RMA ?Care Guide, Embedded Care Coordination ?  Care Management  ?[place], [place] [address] ?Direct Dial: [phone] ?Museum/gallery [email or web address] ?Website: [email or web address]  ? ?

## 2021-04-13 ENCOUNTER — Ambulatory Visit: Payer: Medicare Other | Admitting: Family Medicine

## 2021-04-17 ENCOUNTER — Ambulatory Visit (INDEPENDENT_AMBULATORY_CARE_PROVIDER_SITE_OTHER): Payer: Medicare Other | Admitting: Family Medicine

## 2021-04-17 ENCOUNTER — Encounter: Payer: Self-pay | Admitting: Family Medicine

## 2021-04-17 VITALS — BP 122/69 | HR 58 | Temp 98.4°F | Ht 69.0 in | Wt 164.2 lb

## 2021-04-17 DIAGNOSIS — S40862A Insect bite (nonvenomous) of left upper arm, initial encounter: Secondary | ICD-10-CM | POA: Diagnosis not present

## 2021-04-17 DIAGNOSIS — T7840XA Allergy, unspecified, initial encounter: Secondary | ICD-10-CM

## 2021-04-17 MED ORDER — BETAMETHASONE SOD PHOS & ACET 6 (3-3) MG/ML IJ SUSP
6.0000 mg | Freq: Once | INTRAMUSCULAR | Status: AC
  Administered 2021-04-17: 6 mg via INTRAMUSCULAR

## 2021-04-17 NOTE — Progress Notes (Signed)
Chief Complaint  ?Patient presents with  ? Insect Bite  ? ? ?HPI ? ?Patient presents today for pruritic erythematous eruption on the left upper arm.  Onset was night before last.  It does not have any systemic effects including palpitations chest pain shortness of breath or fever.  She does not know what could have caused it.  She does not know of anything that could have bitten her or contacted her.  She has not been exposed to poison ivy. ? ?PMH: Smoking status noted ?ROS: Per HPI ? ?Objective: ?BP 122/69   Pulse (!) 58   Temp 98.4 ?F (36.9 ?C)   Ht '5\' 9"'$  (1.753 m)   Wt 164 lb 3.2 oz (74.5 kg)   LMP  (LMP Unknown)   SpO2 100%   BMI 24.25 kg/m?  ?Gen: NAD, alert, cooperative with exam ?HEENT: NCAT, EOMI, PERRL ?CV: RRR, good S1/S2, no murmur ?Resp: CTABL, no wheezes, non-labored ?Ext: No edema, warm.  The 1 exception is there is mild edema along with erythema of the ventral aspect of the left upper arm.  There is full range of motion.  The patient does continue to rub and slightly scratch at it.  There are no excoriations. ?Neuro: Alert and oriented, No gross deficits ? ?Assessment and plan: ? ?1. Allergic reaction, initial encounter   ? ? ?Meds ordered this encounter  ?Medications  ? betamethasone acetate-betamethasone sodium phosphate (CELESTONE) injection 6 mg  ? ? ?No orders of the defined types were placed in this encounter. ? ? ?Follow up as needed. ? ?Claretta Fraise, MD ? ? ? ? ?

## 2021-04-23 NOTE — Chronic Care Management (AMB) (Signed)
?  Care Management  ? ?Note ? ?04/23/2021 ?Name: [name] MRN: [id number] DOB: 08-Aug-1940 ? ?[name] is a 81 y.o. year old female who is a primary care patient of Dettinger, Fransisca Kaufmann, MD and is actively engaged with the care management team. I reached out to Thea Gist by phone today to assist with re-scheduling a follow up visit with the RN Case Manager ? ?Follow up plan: ?Unsuccessful telephone outreach attempt made. A HIPAA compliant phone message was left for the patient providing contact information and requesting a return call.  ?The care management team will reach out to the patient again over the next 7 days.  ?If patient returns call to provider office, please advise to call [facility]  at [phone] ? ?Noreene Larsson, RMA ?Care Guide, Embedded Care Coordination ?[facility]  Care Management  ?[place], [place] [address] ?Direct Dial: [phone] ?Museum/gallery [email or web address] ?Website: [email or web address]  ? ?

## 2021-04-27 ENCOUNTER — Ambulatory Visit (HOSPITAL_COMMUNITY)
Admission: RE | Admit: 2021-04-27 | Discharge: 2021-04-27 | Disposition: A | Discharge status: Home / Self Care | Payer: Medicare Other | Source: Ambulatory Visit | Attending: Family Medicine | Admitting: Family Medicine

## 2021-04-27 DIAGNOSIS — Z1231 Encounter for screening mammogram for malignant neoplasm of breast: Secondary | ICD-10-CM | POA: Insufficient documentation

## 2021-04-28 ENCOUNTER — Encounter: Payer: Self-pay | Admitting: Nurse Practitioner

## 2021-04-28 ENCOUNTER — Ambulatory Visit (INDEPENDENT_AMBULATORY_CARE_PROVIDER_SITE_OTHER): Payer: Medicare Other | Admitting: Nurse Practitioner

## 2021-04-28 VITALS — BP 119/57 | HR 71 | Temp 97.6°F | Resp 20 | Ht 69.0 in | Wt 161.0 lb

## 2021-04-28 DIAGNOSIS — J0100 Acute maxillary sinusitis, unspecified: Secondary | ICD-10-CM | POA: Diagnosis not present

## 2021-04-28 DIAGNOSIS — B370 Candidal stomatitis: Secondary | ICD-10-CM | POA: Diagnosis not present

## 2021-04-28 MED ORDER — AMOXICILLIN-POT CLAVULANATE 875-125 MG PO TABS: 1.0000 | ORAL_TABLET | Freq: Two times a day (BID) | ORAL | 0 refills | Status: DC

## 2021-04-28 MED ORDER — NYSTATIN 100000 UNIT/ML MT SUSP: 5.0000 mL | Freq: Four times a day (QID) | OROMUCOSAL | 0 refills | Status: DC

## 2021-04-28 NOTE — Progress Notes (Signed)
? ?Subjective:  ? ? Patient ID: [name], female    DOB: [birth year], 81 y.o.   MRN: [id number] ? ? ?Chief Complaint: Cough, Nasal Congestion, and Ears hurting ? ? ?Cough ?Associated symptoms include postnasal drip, rhinorrhea and a sore throat. Pertinent negatives include no chills, fever, headaches or myalgias.  ?Patient comes in today c/o congestin, cough and bil ear pian. This started over a week ago. She just took OTC meds with  no relief. Her ear is bothering her the most. Slight sore throat. Has a film on her tongue . She has tried brushing it and it will not resolve. ? ? ? ?Review of Systems  ?Constitutional:  Negative for chills and fever.  ?HENT:  Positive for congestion, postnasal drip, rhinorrhea, sinus pressure, sinus pain and sore throat.   ?Respiratory:  Positive for cough.   ?Musculoskeletal:  Negative for myalgias.  ?Neurological:  Negative for headaches.  ? ?   ?Objective:  ? Physical Exam ?Vitals reviewed.  ?Constitutional:   ?   Appearance: Normal appearance.  ?HENT:  ?   Right Ear: Hearing and tympanic membrane normal.  ?   Left Ear: Hearing and tympanic membrane normal.  ?   Nose: Congestion and rhinorrhea present.  ?   Right Sinus: Maxillary sinus tenderness present.  ?   Left Sinus: Maxillary sinus tenderness present.  ?Cardiovascular:  ?   Rate and Rhythm: Normal rate and regular rhythm.  ?   Heart sounds: Normal heart sounds.  ?Pulmonary:  ?   Effort: Pulmonary effort is normal.  ?   Breath sounds: Normal breath sounds.  ?Skin: ?   General: Skin is warm.  ?Neurological:  ?   General: No focal deficit present.  ?   Mental Status: She is alert and oriented to person, place, and time.  ?Psychiatric:     ?   Mood and Affect: Mood normal.     ?   Behavior: Behavior normal.  ? ? ?BP (!) 119/57   Pulse 71   Temp 97.6 ?F (36.4 ?C) (Temporal)   Resp 20   Ht '5\' 9"'$  (1.753 m)   Wt 161 lb (73 kg)   LMP  (LMP Unknown)   SpO2 97%   BMI 23.78 kg/m?  ? ? ? ?   ?Assessment & Plan:  ? ?Thea Gist  in today with chief complaint of Cough, Nasal Congestion, and Ears hurting ? ? ?1. Acute non-recurrent maxillary sinusitis ?1. Take meds as prescribed ?2. Use a cool mist humidifier especially during the winter months and when heat has been humid. ?3. Use saline nose sprays frequently ?4. Saline irrigations of the nose can be very helpful if done frequently. ? * 4X daily for 1 week* ? * Use of a nettie pot can be helpful with this. Follow directions with this* ?5. Drink plenty of fluids ?6. Keep thermostat turn down low ?7.For any cough or congestion- mucinex ?8. For fever or aces or pains- take tylenol or ibuprofen appropriate for age and weight. ? * for fevers greater than 101 orally you may alternate ibuprofen and tylenol every  3 hours. ?  ? ?- amoxicillin-clavulanate (AUGMENTIN) 875-125 MG tablet; Take 1 tablet by mouth 2 (two) times daily.  Dispense: 14 tablet; Refill: 0 ? ?2. Thrush ?Rinse mouth with butter milk ?- nystatin (MYCOSTATIN) 100000 UNIT/ML suspension; Take 5 mLs (500,000 Units total) by mouth 4 (four) times daily.  Dispense: 60 mL; Refill: 0 ? ? ? ?The above assessment and management plan  was discussed with the patient. The patient verbalized understanding of and has agreed to the management plan. Patient is aware to call the clinic if symptoms persist or worsen. Patient is aware when to return to the clinic for a follow-up visit. Patient educated on when it is appropriate to go to the emergency department.  ? ?Mary-Margaret Hassell Done, FNP ? ? ?

## 2021-04-28 NOTE — Patient Instructions (Signed)

## 2021-04-30 NOTE — Chronic Care Management (AMB) (Signed)
?  Care Management  ? ?Note ? ?04/30/2021 ?Name: [name] MRN: [id number] DOB: 1940-06-23 ? ?[name] is a 81 y.o. year old female who is a primary care patient of Dettinger, Fransisca Kaufmann, MD and is actively engaged with the care management team. I reached out to Thea Gist by phone today to assist with re-scheduling a follow up visit with the RN Case Manager ? ?Follow up plan: ?Telephone appointment with care management team member scheduled for:05/08/2021 ? ?Noreene Larsson, RMA ?Care Guide, Embedded Care Coordination ?[facility]  Care Management  ?[place],  [address] ?Direct Dial: [phone] ?Museum/gallery conservator.[email or web address] ?Website: [email or web address]  ? ?

## 2021-05-05 NOTE — Progress Notes (Signed)
? ? ?Patient: [name] ?Date of Birth: 10-26-1940 ? ?Reason for Visit: Follow up neuropathy, gait, MCI ?History from: Patient ?Primary Neurologist: Dr. Julius Spence. [name]  ? ?ASSESSMENT AND PLAN ?81 y.o. year old female  ? ?1.  Gait disturbance ?2.  Peripheral neuropathy ?3.  Mild cognitive impairment ? ?-Stop Namenda, contributing to feeling of unsteadiness? Neuropathy likely also contributing  ?-If no change, consider reducing dose of gabapentin, currently taking 300 mg AM/600 mg PM (reduce back to 1 capsule twice daily?) ?-Continue Aricept 10 mg at bedtime ?-Memory is stable, MMSE 29/30, living alone, remains quite functional and independent ?-Return back in 6 months or sooner if needed  ? ?HISTORY OF PRESENT ILLNESS: ?Today 05/06/21  ?[name] here today for follow-up.  RPR, copper, MM panel were unremarkable. MMSE 29/30 today. Thinks memory doing well, is forgetful, but things might be normal, not given up anything due to memory. Lives alone. Doesn't drive much. Does her own ADL, housework, appointments, medications, finances. Active in church. Friend drove her today. On Aricept 10 mg bedtime, Namenda 5 mg twice daily. Thinks feeling unbalanced, unsteady, from Namenda she thinks? 2 bad falls in the winter (3 broken bones to right foot in Sept, Dec broke left foot), numbness in her feet. Uses cane or walker sometimes, didn't bring today. PT does her no good. Claims went after seeing Dr. Jannifer Spence.  ? ?HISTORY  ?10/29/2020 Dr. Jannifer Spence: [name] is a 81 year old right-handed white female with a history of a mild memory disturbance.  She has been relatively stable with this, she is on Aricept and Namenda currently.  She comes in today with some gradual worsening problems with her balance over the last year.  She reports ongoing numbness of the toes.  She has a lot of leg cramps at night while sleeping, she will take mustard for this.  She has had several falls, she actually fractured her right foot several months  ago.  She has a cane and a walker but does not use this consistently when she is outside of the house.  She claims she did undergo physical therapy without much benefit a year ago for her walking.  She does report chronic low back pain without radiation down the legs.  She does have some problems with urinary incontinence and is on medication for this with some benefit.  She has not given up any activities daily living because of memory, she continues to operate a motor vehicle.  She returns for an evaluation. ? ?REVIEW OF SYSTEMS: Out of a complete 14 system review of symptoms, the patient complains only of the following symptoms, and all other reviewed systems are negative. ? ?See HPI ? ?ALLERGIES: ?Allergies  ?Allergen Reactions  ? Atorvastatin Other (See Comments)  ?  Muscle cramps  ? Meperidine Hcl Nausea And Vomiting  ?  Makes me 'deathly' sick  ? Statins Other (See Comments)  ?  REACTION: Patient cannot tolerate due cramps  ? Norco [Hydrocodone-Acetaminophen] Nausea And Vomiting  ? Sulfonamide Derivatives Other (See Comments)  ?  Unknown reaction  ? ? ?HOME MEDICATIONS: ?Outpatient Medications Prior to Visit  ?Medication Sig Dispense Refill  ? albuterol (VENTOLIN HFA) 108 (90 Base) MCG/ACT inhaler Inhale 2 puffs into the lungs every 6 (six) hours as needed for wheezing or shortness of breath. 8 g 0  ? aspirin 81 MG tablet Take 81 mg by mouth daily.    ? busPIRone (BUSPAR) 30 MG tablet Take 0.5-1 tablets (15-30 mg total) by mouth daily as  needed. TAKE (1/2) TO (1) TABLET BY MOUTH ONCE DAILY AS NEEDED 90 tablet 3  ? Calcium Carbonate-Vitamin D 500-125 MG-UNIT TABS Take 1 capsule by mouth daily.    ? Cyanocobalamin (VITAMIN B 12 PO) Take 1 tablet by mouth daily.     ? dimenhyDRINATE (DRAMAMINE) 50 MG tablet Take 50 mg by mouth every 8 (eight) hours as needed.    ? ezetimibe (ZETIA) 10 MG tablet Take 1 tablet (10 mg total) by mouth daily. 90 tablet 3  ? fluticasone (FLONASE) 50 MCG/ACT nasal spray Place 2 sprays  into both nostrils daily. 16 g 6  ? hydrochlorothiazide (HYDRODIURIL) 12.5 MG tablet Take 1 tablet (12.5 mg total) by mouth daily. 90 tablet 3  ? ibuprofen (ADVIL,MOTRIN) 200 MG tablet Take 800 mg by mouth every 6 (six) hours as needed (Pain).     ? levothyroxine (SYNTHROID) 125 MCG tablet Take 1 tablet (125 mcg total) by mouth daily. 90 tablet 3  ? lisinopril (ZESTRIL) 2.5 MG tablet Take 1 tablet (2.5 mg total) by mouth daily. 90 tablet 3  ? mirabegron ER (MYRBETRIQ) 25 MG TB24 tablet Take 1 tablet (25 mg total) by mouth daily. 90 tablet 3  ? Multiple Vitamin (MULTIVITAMIN) capsule Take 1 capsule by mouth daily.    ? nystatin (MYCOSTATIN) 100000 UNIT/ML suspension Take 5 mLs (500,000 Units total) by mouth 4 (four) times daily. 60 mL 0  ? Omega-3 Fatty Acids (FISH OIL) 1000 MG CAPS Take 1,000 mg by mouth daily.    ? pantoprazole (PROTONIX) 20 MG tablet Take 1 tablet (20 mg total) by mouth daily. 30 tablet 1  ? donepezil (ARICEPT) 10 MG tablet TAKE 1 TABLET BY MOUTH AT BEDTIME. 90 tablet 3  ? gabapentin (NEURONTIN) 300 MG capsule TAKE 1 CAPSULE EACH MORNING AND 2 AT BEDTIME. 270 capsule 3  ? memantine (NAMENDA) 5 MG tablet Take 1 tablet (5 mg total) by mouth 2 (two) times daily. 180 tablet 3  ? amoxicillin-clavulanate (AUGMENTIN) 875-125 MG tablet Take 1 tablet by mouth 2 (two) times daily. 14 tablet 0  ? fluconazole (DIFLUCAN) 100 MG tablet Take 1 tablet (100 mg total) by mouth daily. 15 tablet 0  ? ?No facility-administered medications prior to visit.  ? ? ?PAST MEDICAL HISTORY: ?Past Medical History:  ?Diagnosis Date  ? Anal or rectal pain   ? Anxiety state, unspecified   ? Arthritis   ? Arthropathy, unspecified, site unspecified   ? Blood transfusion without reported diagnosis   ? Carpal tunnel syndrome   ? Bilateral  ? Cataract   ? Right 7 Left eye remved cateracts  ? Chronic airway obstruction, not elsewhere classified   ? patient denies on 02/27/14  ? Esophageal reflux   ? Esophageal stricture   ? Hypertension    ? Internal hemorrhoids without mention of complication   ? Malignant neoplasm of breast (female), unspecified site   ? left breast   ? Memory disorder 09/24/2013  ? Moderate obstructive sleep apnea 01/20/2021  ? Nocturnal leg cramps 06/29/2018  ? Osteopenia   ? Other and unspecified hyperlipidemia   ? Pain in joint, multiple sites   ? Personal history of colonic polyps   ? adenomatous  ? Polyneuropathy in other diseases classified elsewhere ([place]) 09/24/2013  ? Shingles   ? Sleep apnea   ? no longer uses cpap  ? Tubular adenoma of colon   ? Unspecified hypothyroidism   ? ? ?PAST SURGICAL HISTORY: ?Past Surgical History:  ?Procedure Laterality Date  ?  APPENDECTOMY  03/2014  ? BACK SURGERY    ? X2  ? BREAST LUMPECTOMY    ? COLONOSCOPY  08/03/2018  ? EYE SURGERY    ? cataracts bilateral  ? LAPAROSCOPIC APPENDECTOMY N/A 02/28/2014  ? Procedure: APPENDECTOMY LAPAROSCOPIC PARTIAL CECECTOMY FOR MUCOCELE OF APPENDIX ;  Surgeon: Michael Boston, MD;  Location: [facility];  Service: General;  Laterality: N/A;  ? MASTECTOMY    ? left  ? POLYPECTOMY    ? THYROIDECTOMY    ? TUBAL LIGATION    ? UPPER GASTROINTESTINAL ENDOSCOPY    ? ? ?FAMILY HISTORY: ?Family History  ?Problem Relation Age of Onset  ? Bipolar disorder Sister   ? Early death Mother   ? Early death Sister   ? Colon cancer Neg Hx   ? Esophageal cancer Neg Hx   ? Rectal cancer Neg Hx   ? Stomach cancer Neg Hx   ? ? ?SOCIAL HISTORY: ?Social History  ? ?Socioeconomic History  ? Marital status: Divorced  ?  Spouse name: Not on file  ? Number of children: 2  ? Years of education: Not on file  ? Highest education level: Not on file  ?Occupational History  ? Occupation: Retired  ?  Employer: RETIRED  ?Tobacco Use  ? Smoking status: Never  ? Smokeless tobacco: Never  ?Vaping Use  ? Vaping Use: Never used  ?Substance and Sexual Activity  ? Alcohol use: No  ?  Alcohol/week: 0.0 standard drinks  ? Drug use: No  ? Sexual activity: Never  ?  Birth control/protection: Post-menopausal   ?Other Topics Concern  ? Not on file  ?Social History Narrative  ? Patient lives at home alone and she is divorced.  ? Retired.  ? Education business course.  ? Right handed.  ? Caffeine sometimes tea.   ?

## 2021-05-06 ENCOUNTER — Encounter: Payer: Self-pay | Admitting: Neurology

## 2021-05-06 ENCOUNTER — Ambulatory Visit: Payer: Medicare Other | Admitting: Neurology

## 2021-05-06 VITALS — BP 110/70 | HR 66 | Ht 69.0 in | Wt 163.0 lb

## 2021-05-06 DIAGNOSIS — G63 Polyneuropathy in diseases classified elsewhere: Secondary | ICD-10-CM

## 2021-05-06 DIAGNOSIS — R413 Other amnesia: Secondary | ICD-10-CM

## 2021-05-06 DIAGNOSIS — R269 Unspecified abnormalities of gait and mobility: Secondary | ICD-10-CM

## 2021-05-06 MED ORDER — GABAPENTIN 300 MG PO CAPS: ORAL_CAPSULE | ORAL | 0 refills | Status: DC

## 2021-05-06 MED ORDER — DONEPEZIL HCL 10 MG PO TABS: 10.0000 mg | ORAL_TABLET | Freq: Every day | ORAL | 3 refills | Status: DC

## 2021-05-06 NOTE — Patient Instructions (Addendum)
Stop the namenda to see if any change in unsteady feeling ?If no change, lets then reduce the gabapentin dosing, call me when you are ready to discuss this, I will tell you how to adjust the dosing ?See you back in 6 months ? ?

## 2021-05-08 ENCOUNTER — Ambulatory Visit (INDEPENDENT_AMBULATORY_CARE_PROVIDER_SITE_OTHER): Payer: Medicare Other | Admitting: *Deleted

## 2021-05-08 DIAGNOSIS — G4733 Obstructive sleep apnea (adult) (pediatric): Secondary | ICD-10-CM

## 2021-05-08 DIAGNOSIS — I10 Essential (primary) hypertension: Secondary | ICD-10-CM

## 2021-05-12 NOTE — Patient Instructions (Signed)
Visit Information ? ?Patient Goals/Self-Care Activities: ?Perform IADL's (shopping, preparing meals, housekeeping, managing finances) independently ?Call provider office for new concerns or questions  ?Call [place] when you receive the decision letter [phone] ?Use CPAP as instructed ?Practice good sleep hygiene ?Follow-up with sleep lab as instructed ?Check and record blood pressure several times a wee ?Call PCP with any readings outside of recommended range ?Call RN Care Manage as needed ? ?Patient verbalizes understanding of instructions and care plan provided today and agrees to view in [facility]. Active MyChart status confirmed with patient.   ? ?Plan:Telephone follow up appointment with care management team member scheduled for:  06/12/21 with RNCM ? ?Chong Sicilian, BSN, RN-BC ?Embedded Chronic Care Manager ?[place] / [place] [facility] ?Direct Dial: [phone] ?  ?

## 2021-05-12 NOTE — Chronic Care Management (AMB) (Signed)
?Chronic Care Management  ? ?CCM RN Visit Note ? ?05/12/2021 ?Name: [name] MRN: [id number] DOB: March 21, 1940 ? ?Subjective: ?[name] is a 81 y.o. year old female who is a primary care patient of Dettinger, Fransisca Kaufmann, MD. The care management team was consulted for assistance with disease management and care coordination needs.   ? ?Engaged with patient by telephone for follow up visit in response to provider referral for case management and/or care coordination services.  ? ?Consent to Services:  ?The patient was given information about Chronic Care Management services, agreed to services, and gave verbal consent prior to initiation of services.  Please see initial visit note for detailed documentation.  ? ?Patient agreed to services and verbal consent obtained.  ? ?Assessment: Review of patient past medical history, allergies, medications, health status, including review of consultants reports, laboratory and other test data, was performed as part of comprehensive evaluation and provision of chronic care management services.  ? ?SDOH (Social Determinants of Health) assessments and interventions performed:   ? ?CCM Care Plan ? ?Allergies  ?Allergen Reactions  ? Atorvastatin Other (See Comments)  ?  Muscle cramps  ? Meperidine Hcl Nausea And Vomiting  ?  Makes me 'deathly' sick  ? Statins Other (See Comments)  ?  REACTION: Patient cannot tolerate due cramps  ? Norco [Hydrocodone-Acetaminophen] Nausea And Vomiting  ? Sulfonamide Derivatives Other (See Comments)  ?  Unknown reaction  ? ? ?Outpatient Encounter Medications as of 05/08/2021  ?Medication Sig  ? albuterol (VENTOLIN HFA) 108 (90 Base) MCG/ACT inhaler Inhale 2 puffs into the lungs every 6 (six) hours as needed for wheezing or shortness of breath.  ? amoxicillin-clavulanate (AUGMENTIN) 875-125 MG tablet Take 1 tablet by mouth 2 (two) times daily.  ? aspirin 81 MG tablet Take 81 mg by mouth daily.  ? busPIRone (BUSPAR) 30 MG tablet Take 0.5-1 tablets (15-30  mg total) by mouth daily as needed. TAKE (1/2) TO (1) TABLET BY MOUTH ONCE DAILY AS NEEDED  ? Calcium Carbonate-Vitamin D 500-125 MG-UNIT TABS Take 1 capsule by mouth daily.  ? Cyanocobalamin (VITAMIN B 12 PO) Take 1 tablet by mouth daily.   ? dimenhyDRINATE (DRAMAMINE) 50 MG tablet Take 50 mg by mouth every 8 (eight) hours as needed.  ? donepezil (ARICEPT) 10 MG tablet Take 1 tablet (10 mg total) by mouth at bedtime.  ? ezetimibe (ZETIA) 10 MG tablet Take 1 tablet (10 mg total) by mouth daily.  ? fluconazole (DIFLUCAN) 100 MG tablet Take 1 tablet (100 mg total) by mouth daily.  ? fluticasone (FLONASE) 50 MCG/ACT nasal spray Place 2 sprays into both nostrils daily.  ? gabapentin (NEURONTIN) 300 MG capsule Take 1 in the morning, take 2 at night  ? hydrochlorothiazide (HYDRODIURIL) 12.5 MG tablet Take 1 tablet (12.5 mg total) by mouth daily.  ? ibuprofen (ADVIL,MOTRIN) 200 MG tablet Take 800 mg by mouth every 6 (six) hours as needed (Pain).   ? levothyroxine (SYNTHROID) 125 MCG tablet Take 1 tablet (125 mcg total) by mouth daily.  ? lisinopril (ZESTRIL) 2.5 MG tablet Take 1 tablet (2.5 mg total) by mouth daily.  ? mirabegron ER (MYRBETRIQ) 25 MG TB24 tablet Take 1 tablet (25 mg total) by mouth daily.  ? Multiple Vitamin (MULTIVITAMIN) capsule Take 1 capsule by mouth daily.  ? nystatin (MYCOSTATIN) 100000 UNIT/ML suspension Take 5 mLs (500,000 Units total) by mouth 4 (four) times daily.  ? Omega-3 Fatty Acids (FISH OIL) 1000 MG CAPS Take 1,000 mg by mouth  daily.  ? pantoprazole (PROTONIX) 20 MG tablet Take 1 tablet (20 mg total) by mouth daily.  ? ?No facility-administered encounter medications on file as of 05/08/2021.  ? ? ?Patient Active Problem List  ? Diagnosis Date Noted  ? Gait disturbance 05/06/2021  ? Moderate obstructive sleep apnea 01/20/2021  ? Other fatigue 02/26/2020  ? Nocturnal leg cramps 06/29/2018  ? Aortic atherosclerosis ([place]) 11/08/2016  ? Thoracic aortic ectasia ([place]) 11/08/2016  ? 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 Polyneuropathy in other diseases classified elsewhere ([place]) 09/24/2013  ? Memory disorder 09/24/2013  ? Hypothyroidism, postsurgical 05/15/2013  ? HTN (hypertension) 05/15/2013  ? Vitamin D deficiency 05/15/2013  ? Hyperlipidemia 05/15/2013  ? Osteopenia 10/25/2012  ? Trigeminal neuralgia 04/02/2011  ? GERD (gastroesophageal reflux disease) 07/23/2010  ? Anxiety state 05/24/2008  ? COPD (chronic obstructive pulmonary disease) ([place]) 05/24/2008  ? ARTHRITIS 05/24/2008  ? Idiopathic osteoporosis 05/24/2008  ? Malignant neoplasm of upper-outer quadrant of left female breast ([place]) 05/22/2008  ? COLONIC POLYPS, ADENOMATOUS, HX OF 05/22/2008  ? ? ?Conditions to be addressed/monitored:HTN, Hypothyroidism, and sleep apnea ? ?Care Plan : RNCM Care Plan  ?  ? ?Problem: Chronic Disease Management Needs   ?Priority: High  ?Onset Date: 12/05/2020  ?  ? ?Long-Range Goal: Patient will Work with Consulting civil engineer to Develop a [place] with HTN, hypothyroidism, arthritis, anxiety, Moderate obstructive sleep apnea, nocturnal hypoxemia, COPD   ?Start Date: 12/05/2020  ?Expected End Date: 12/05/2021  ?Recent Progress: On track  ?Priority: High  ?Note:   ?Current Barriers:  ?Financial constraints related to cost of medications ?Chronic disease management needs related to HTN, Hypothyroidism, and sleep apnea ? ?RNCM Clinical Goal(s):  ?Patient will  complete applications for prescription assistance  through collaboration with RN Care manager, provider, and care team.  ?Patient will work with RN Care Manager regarding Care Coordination and Care Management associated with hypertension, overactive bladder, hypothyroidism, sleep apnea ? ?Interventions: ?1:1 collaboration with primary care provider regarding development and update of comprehensive plan of care as evidenced by provider attestation and  co-signature ?Inter-disciplinary care team collaboration (see longitudinal plan of care) ?Evaluation of current treatment plan related to  self management and patient's adherence to plan as established by provider ?Discussed family/social support ?Daughter lives locally and helps out. Son lives a couple hours away but calls and visits when he can. ?Discussed mobility and ability to perform ADLs ? ? ?SDOH Barriers (Status: Goal Not Met.) Short Term Goal  ?Patient interviewed and SDOH assessment performed ?       ?SDOH Interventions   ? ?Flowsheet Row Most Recent Value  ?SDOH Interventions   ?Financial Strain Interventions Other (Comment)  [assisted with Medicare Extra Help Application]  ?Social Connections Interventions Intervention Not Indicated  ? ?  ?Provided patient with information about Medicare Extra Help for Prescription Drug Coverage (LIS) and the limitations for Myrbetriq manufacturer assistance ?Discussed plans with patient for ongoing care management follow up and provided patient with direct contact information for care management team ?Assisted patient/caregiver with obtaining information about health plan benefits ?Previously assisted patient with completion of online Medicare Extra Help application ?Therapeutic listening utilized regarding health related anxiety and financial constraints ?Discussed Medicare determination letter. She did not qualify for Medicare Extra Help (LIS).  ? ? ?Hypertension: (Status: Goal on Track (progressing): YES.) Long Term Goal  ?Last practice recorded BP readings:  ?BP Readings from Last 3  Encounters:  ?05/06/21 110/70  ?04/28/21 (!) 119/57  ?04/17/21 122/69  ?Evaluation of current treatment plan related to hypertension self management and patient's adherence to plan as established by provider;   ?Reviewed medications with patient and discussed importance of compliance;  ?Counseled on the importance of exercise goals with target of 150 minutes per week ?Discussed plans  with patient for ongoing care management follow up and provided patient with direct contact information for care management team; ?Advised patient, providing education and rationale, to monitor blood pressure daily and recor

## 2021-05-18 DIAGNOSIS — H209 Unspecified iridocyclitis: Secondary | ICD-10-CM | POA: Diagnosis not present

## 2021-05-18 DIAGNOSIS — H43813 Vitreous degeneration, bilateral: Secondary | ICD-10-CM | POA: Diagnosis not present

## 2021-05-18 DIAGNOSIS — H5711 Ocular pain, right eye: Secondary | ICD-10-CM | POA: Diagnosis not present

## 2021-05-18 DIAGNOSIS — R519 Headache, unspecified: Secondary | ICD-10-CM | POA: Diagnosis not present

## 2021-05-18 DIAGNOSIS — Z961 Presence of intraocular lens: Secondary | ICD-10-CM | POA: Diagnosis not present

## 2021-05-18 DIAGNOSIS — H15101 Unspecified episcleritis, right eye: Secondary | ICD-10-CM | POA: Diagnosis not present

## 2021-05-19 ENCOUNTER — Encounter: Payer: Self-pay | Admitting: Nurse Practitioner

## 2021-05-19 ENCOUNTER — Ambulatory Visit (INDEPENDENT_AMBULATORY_CARE_PROVIDER_SITE_OTHER): Payer: Medicare Other | Admitting: Nurse Practitioner

## 2021-05-19 VITALS — BP 107/62 | HR 73 | Temp 97.4°F | Resp 16 | Ht 69.5 in | Wt 164.2 lb

## 2021-05-19 DIAGNOSIS — B0239 Other herpes zoster eye disease: Secondary | ICD-10-CM | POA: Diagnosis not present

## 2021-05-19 DIAGNOSIS — H532 Diplopia: Secondary | ICD-10-CM | POA: Diagnosis not present

## 2021-05-19 DIAGNOSIS — H53139 Sudden visual loss, unspecified eye: Secondary | ICD-10-CM | POA: Diagnosis not present

## 2021-05-19 DIAGNOSIS — M316 Other giant cell arteritis: Secondary | ICD-10-CM | POA: Diagnosis not present

## 2021-05-19 DIAGNOSIS — R519 Headache, unspecified: Secondary | ICD-10-CM | POA: Diagnosis not present

## 2021-05-19 DIAGNOSIS — H4710 Unspecified papilledema: Secondary | ICD-10-CM | POA: Diagnosis not present

## 2021-05-19 NOTE — Patient Instructions (Signed)

## 2021-05-19 NOTE — Progress Notes (Signed)
? ?  Acute Office Visit ? ?Subjective:  ? ?  ?Patient ID: [name], female    DOB: October 03, 1940, 81 y.o.   MRN: [id number] ? ?Chief Complaint  ?Patient presents with  ? Eye Pain  ?  Shingles in Eye  ? ? ?Eye Pain  ?The right eye is affected. This is a new problem. The current episode started yesterday. The problem occurs constantly. The problem has been unchanged. Injury mechanism: shingles rash. The pain is moderate. There is No known exposure to pink eye. She Does not wear contacts. Associated symptoms include eye redness and itching. Pertinent negatives include no blurred vision, fever, foreign body sensation or photophobia.  ? ?Review of Systems  ?Constitutional: Negative.  Negative for chills, fever and weight loss.  ?HENT: Negative.    ?Eyes:  Positive for pain, redness and itching. Negative for blurred vision and photophobia.  ?Cardiovascular:  Negative for chest pain.  ?Skin:  Negative for rash.  ?All other systems reviewed and are negative. ? ? ?   ?Objective:  ?  ?BP 107/62   Pulse 73   Temp (!) 97.4 ?F (36.3 ?C)   Resp 16   Ht 5' 9.5" (1.765 m)   Wt 164 lb 3.2 oz (74.5 kg)   LMP  (LMP Unknown)   SpO2 96%   BMI 23.90 kg/m?  ?BP Readings from Last 3 Encounters:  ?05/19/21 107/62  ?05/06/21 110/70  ?04/28/21 (!) 119/57  ? ?Wt Readings from Last 3 Encounters:  ?05/19/21 164 lb 3.2 oz (74.5 kg)  ?05/06/21 163 lb (73.9 kg)  ?04/28/21 161 lb (73 kg)  ? ?  ? ?Physical Exam ?Vitals and nursing note reviewed.  ?HENT:  ?   Head: Normocephalic.  ?   Right Ear: External ear normal.  ?   Left Ear: External ear normal.  ?   Nose: Nose normal.  ?Eyes:  ?   General:     ?   Right eye: Discharge present. No foreign body.     ?   Left eye: No discharge.  ?   Conjunctiva/sclera:  ?   Right eye: Right conjunctiva is injected. Exudate present.  ?Pulmonary:  ?   Effort: Pulmonary effort is normal.  ?Abdominal:  ?   General: Bowel sounds are normal.  ? ? ?No results found for any visits on 05/19/21. ? ? ?   ?Assessment &  Plan:  ?Patient presents with shingles and right eye, symptoms gradually resolving.  Patient started on antiviral, completed labs as requested from ophthalmology. ?Keep hands clean while touching eyes, Tylenol for pain as needed. ?Follow-up with worsening unresolved symptoms. ?Problem List Items Addressed This Visit   ?None ?Visit Diagnoses   ? ? Shingles of eyelid    -  Primary  ? ?  ? ? ?No orders of the defined types were placed in this encounter. ? ? ?Return if symptoms worsen or fail to improve. ? ?Ivy Lynn, NP ? ? ?

## 2021-05-20 DIAGNOSIS — Z961 Presence of intraocular lens: Secondary | ICD-10-CM | POA: Diagnosis not present

## 2021-05-20 DIAGNOSIS — H5711 Ocular pain, right eye: Secondary | ICD-10-CM | POA: Diagnosis not present

## 2021-05-20 DIAGNOSIS — H209 Unspecified iridocyclitis: Secondary | ICD-10-CM | POA: Diagnosis not present

## 2021-05-20 DIAGNOSIS — H15101 Unspecified episcleritis, right eye: Secondary | ICD-10-CM | POA: Diagnosis not present

## 2021-05-20 DIAGNOSIS — R519 Headache, unspecified: Secondary | ICD-10-CM | POA: Diagnosis not present

## 2021-05-21 ENCOUNTER — Telehealth (INDEPENDENT_AMBULATORY_CARE_PROVIDER_SITE_OTHER): Payer: Medicare Other | Admitting: Emergency Medicine

## 2021-05-21 NOTE — Telephone Encounter (Signed)
TC from Patient- she states she woke up this morning and states that she was having heartburn, she has taken Tums today without any help. Patient reports- arm pain and periodic shooting pains in her chest. I advised patient to proceed to ED. Patient was unhappy with my recommendations d/t the amount she would have to pay at the hospital.

## 2021-05-22 NOTE — Telephone Encounter (Signed)
I agree because of the concern with her age and risk factors that it could be heart related so emergency department would be the best option.  She can also try doubling her pantoprazole for a few days and see if that helps as well but I would go to the emergency department first to rule out any heart problems

## 2021-05-22 NOTE — Telephone Encounter (Signed)
Pt advised of Dr. Merita Norton recommendations. Pt states that she still does not want to go tot the ER. Pt went to the urgent care yesterday and was told that she might be having panic attacks. She does have a medication for anxiety but sounds like she does not take it consistently. States that she did take one of her medications for anxiety yesterday. Advised pt that she should take all of her medications as prescribed. Pt informed to also go to the ER if pain continues. She understood.

## 2021-05-28 DIAGNOSIS — H209 Unspecified iridocyclitis: Secondary | ICD-10-CM | POA: Diagnosis not present

## 2021-05-28 DIAGNOSIS — Z961 Presence of intraocular lens: Secondary | ICD-10-CM | POA: Diagnosis not present

## 2021-05-28 DIAGNOSIS — H15101 Unspecified episcleritis, right eye: Secondary | ICD-10-CM | POA: Diagnosis not present

## 2021-05-28 DIAGNOSIS — H5711 Ocular pain, right eye: Secondary | ICD-10-CM | POA: Diagnosis not present

## 2021-05-28 DIAGNOSIS — R519 Headache, unspecified: Secondary | ICD-10-CM | POA: Diagnosis not present

## 2021-06-03 DIAGNOSIS — I10 Essential (primary) hypertension: Secondary | ICD-10-CM

## 2021-06-12 ENCOUNTER — Telehealth: Payer: Medicare Other | Admitting: *Deleted

## 2021-06-12 ENCOUNTER — Telehealth: Payer: Self-pay | Admitting: *Deleted

## 2021-06-30 DIAGNOSIS — H547 Unspecified visual loss: Secondary | ICD-10-CM | POA: Diagnosis not present

## 2021-06-30 DIAGNOSIS — H209 Unspecified iridocyclitis: Secondary | ICD-10-CM | POA: Diagnosis not present

## 2021-06-30 DIAGNOSIS — H15101 Unspecified episcleritis, right eye: Secondary | ICD-10-CM | POA: Diagnosis not present

## 2021-06-30 DIAGNOSIS — H35372 Puckering of macula, left eye: Secondary | ICD-10-CM | POA: Diagnosis not present

## 2021-06-30 DIAGNOSIS — H5711 Ocular pain, right eye: Secondary | ICD-10-CM | POA: Diagnosis not present

## 2021-07-01 ENCOUNTER — Other Ambulatory Visit: Payer: Self-pay | Admitting: Family Medicine

## 2021-07-02 DIAGNOSIS — R29818 Other symptoms and signs involving the nervous system: Secondary | ICD-10-CM | POA: Diagnosis not present

## 2021-07-02 DIAGNOSIS — M47816 Spondylosis without myelopathy or radiculopathy, lumbar region: Secondary | ICD-10-CM | POA: Diagnosis not present

## 2021-07-02 DIAGNOSIS — M5136 Other intervertebral disc degeneration, lumbar region: Secondary | ICD-10-CM | POA: Diagnosis not present

## 2021-07-02 DIAGNOSIS — M79605 Pain in left leg: Secondary | ICD-10-CM | POA: Diagnosis not present

## 2021-07-02 DIAGNOSIS — M5137 Other intervertebral disc degeneration, lumbosacral region: Secondary | ICD-10-CM | POA: Diagnosis not present

## 2021-07-02 DIAGNOSIS — M47817 Spondylosis without myelopathy or radiculopathy, lumbosacral region: Secondary | ICD-10-CM | POA: Diagnosis not present

## 2021-07-03 ENCOUNTER — Telehealth: Payer: Self-pay | Admitting: Family Medicine

## 2021-07-03 NOTE — Telephone Encounter (Signed)
Stop Namenda, contributing to feeling of unsteadiness? Neuropathy likely also contributing  -If no change, consider reducing dose of gabapentin, currently taking 300 mg AM/600 mg PM (reduce back to 1 capsule twice daily?) -Continue Aricept 10 mg at bedtime  Per office visit notes from patient's visit with neurologist on 05/06/21.  Patient has been informed and will not take medication.

## 2021-07-08 ENCOUNTER — Telehealth: Payer: Self-pay

## 2021-07-08 NOTE — Chronic Care Management (AMB) (Signed)
  Chronic Care Management Note  07/08/2021 Name: [name] MRN: [id number] DOB: May 08, 1940  [name] is a 81 y.o. year old female who is a primary care patient of Dettinger, Fransisca Kaufmann, MD and is actively engaged with the care management team. I reached out to Thea Gist by phone today to assist with re-scheduling a follow up visit with the RN Case Manager  Follow up plan: Unsuccessful telephone outreach attempt made. A HIPAA compliant phone message was left for the patient providing contact information and requesting a return call.  The care management team will reach out to the patient again over the next 7 days.  If patient returns call to provider office, please advise to call [facility]  at [place], [place], [place] [facility]  [place], [place] [address] Direct Dial: [phone] [email or web address]

## 2021-07-08 NOTE — Telephone Encounter (Signed)
  Care Management   Follow Up Note   06/12/2021 Name: [name] MRN: [id number] DOB: 1940/11/15   Referred by: Dettinger, Fransisca Kaufmann, MD Reason for referral : Chronic Care Management (Unsuccessful outreach)   An unsuccessful telephone outreach was attempted today. The patient was referred to the case management team for assistance with care management and care coordination.   Follow Up Plan: A HIPPA compliant phone message was left for the patient providing contact information and requesting a return call. Forwarding to [facility] for outreach and rescheduling.  Chong Sicilian, BSN, RN-BC Embedded Chronic Care Manager [facility] / [place] [facility] Direct Dial: [phone]

## 2021-07-22 DIAGNOSIS — M47816 Spondylosis without myelopathy or radiculopathy, lumbar region: Secondary | ICD-10-CM | POA: Diagnosis not present

## 2021-07-22 DIAGNOSIS — M1612 Unilateral primary osteoarthritis, left hip: Secondary | ICD-10-CM | POA: Diagnosis not present

## 2021-07-22 DIAGNOSIS — M5136 Other intervertebral disc degeneration, lumbar region: Secondary | ICD-10-CM | POA: Diagnosis not present

## 2021-07-22 NOTE — Chronic Care Management (AMB) (Signed)
  Care Coordination Note  07/22/2021 Name: [name] MRN: [id number] DOB: 1940-10-31  [name] is a 81 y.o. year old female who is a primary care patient of Dettinger, Fransisca Kaufmann, MD and is actively engaged with the care management team. I reached out to Thea Gist by phone today to assist with re-scheduling a follow up visit with the RN Case Manager  Follow up plan: Unable to make contact on outreach attempts x 2. PCP Dettinger, Fransisca Kaufmann, MD notified via routed documentation in medical record.   Noreene Larsson, [place], [place] [address] Direct Dial: [phone] [email or web address]

## 2021-07-28 DIAGNOSIS — M1612 Unilateral primary osteoarthritis, left hip: Secondary | ICD-10-CM | POA: Diagnosis not present

## 2021-07-28 DIAGNOSIS — M5416 Radiculopathy, lumbar region: Secondary | ICD-10-CM | POA: Diagnosis not present

## 2021-07-28 DIAGNOSIS — M7062 Trochanteric bursitis, left hip: Secondary | ICD-10-CM | POA: Diagnosis not present

## 2021-07-28 DIAGNOSIS — M25552 Pain in left hip: Secondary | ICD-10-CM | POA: Diagnosis not present

## 2021-08-03 ENCOUNTER — Ambulatory Visit: Payer: Medicare Other | Admitting: *Deleted

## 2021-08-03 DIAGNOSIS — I10 Essential (primary) hypertension: Secondary | ICD-10-CM

## 2021-08-03 NOTE — Patient Instructions (Signed)
Thea Gist  At some point during the past 4 years, I have worked with you through the [place] Management Program at [facility].  Due to program changes I am removing myself from your care team.   If you are currently active with another CCM Team Member, you will remain active with them unless they reach out to you with additional information.   If you feel that you need services in the future,  please talk with your primary care provider and request a new referral for Care Management or Care Coordination services. This does not affect your status as a patient at [facility].   Thank you for allowing me to participate in your your healthcare journey.  Chong Sicilian, BSN, RN-BC Embedded Chronic Care Manager [facility] Direct Dial: [phone]

## 2021-08-03 NOTE — Chronic Care Management (AMB) (Signed)
  Chronic Care Management   Note  08/03/2021 Name: [name] MRN: [id number] DOB: [birth year]  Chart review performed and unsuccessful telephone outreach to patient to discuss changes in CCM program and any current needs.   Due to changes in the Chronic Care Management program, I am removing myself as the [place] from the Care Team and closing RN Care Management Care Plans. Patient will be followed by the RN Care Coordination nurse for [facility].   Patient does not have an open Care Plan with another CCM team member. Patient does not have a current CCM referral placed since 05/04/21. CCM enrollment status changed to "note enrolled".   Patient's PCP can place a new referral if the they needs Care Management or Care Coordination services in the future.  Chong Sicilian, BSN, RN-BC Embedded Chronic Care Manager [facility] Direct Dial: [phone]

## 2021-08-06 DIAGNOSIS — M5137 Other intervertebral disc degeneration, lumbosacral region: Secondary | ICD-10-CM | POA: Diagnosis not present

## 2021-08-06 DIAGNOSIS — M47817 Spondylosis without myelopathy or radiculopathy, lumbosacral region: Secondary | ICD-10-CM | POA: Diagnosis not present

## 2021-08-06 DIAGNOSIS — M5136 Other intervertebral disc degeneration, lumbar region: Secondary | ICD-10-CM | POA: Diagnosis not present

## 2021-08-06 DIAGNOSIS — M47816 Spondylosis without myelopathy or radiculopathy, lumbar region: Secondary | ICD-10-CM | POA: Diagnosis not present

## 2021-08-11 DIAGNOSIS — M1612 Unilateral primary osteoarthritis, left hip: Secondary | ICD-10-CM | POA: Diagnosis not present

## 2021-08-14 DIAGNOSIS — M25552 Pain in left hip: Secondary | ICD-10-CM | POA: Diagnosis not present

## 2021-10-05 ENCOUNTER — Ambulatory Visit: Payer: Medicare Other | Admitting: Family Medicine

## 2021-11-02 ENCOUNTER — Ambulatory Visit: Payer: Medicare Other

## 2021-11-17 NOTE — Progress Notes (Unsigned)
Patient: [name] Date of Birth: [birth year]  Reason for Visit: Follow up neuropathy, gait, MCI History from: Patient Primary Neurologist: Dr. Julius Bowels. [name]   ASSESSMENT AND PLAN 81 y.o. year old female   1.  Gait disturbance 2.  Peripheral neuropathy 3.  Mild cognitive impairment  -MMSE 29/30 -Wants to restart Namenda, will restart 5 mg twice daily, watch for side effect of dizziness or gait instability -Continue Aricept 10 mg at bedtime -Continue gabapentin 300/600 mg daily -Encouraged continuing exercise classes -Follow-up with orthopedics if hip and back pain continues to be problematic -Follow-up in 6 months or sooner if needed  HISTORY OF PRESENT ILLNESS: Today 11/18/21  MMSE 29/30. Stopped the Namenda, feels less cloudy headed, but no change to balance. Potentially memory is worse, had trouble finding names. Lives alone, drives a car, no problems. Is independent. Still has poor balance, has muscle soreness/cramps. Has chronic back pain, hip pain. Has seen orthopedics. Takes gabapentin 300 mg AM, 600 mg at bedtime. Takes B 12. Has numbness in feet and hands. Yesterday she painted the doors in her house for 10 hours. Does senior exercise classes twice a week.   Update 05/06/21 SS: [name] here today for follow-up.  RPR, copper, MM panel were unremarkable. MMSE 29/30 today. Thinks memory doing well, is forgetful, but things might be normal, not given up anything due to memory. Lives alone. Doesn't drive much. Does her own ADL, housework, appointments, medications, finances. Active in church. Friend drove her today. On Aricept 10 mg bedtime, Namenda 5 mg twice daily. Thinks feeling unbalanced, unsteady, from Namenda she thinks? 2 bad falls in the winter (3 broken bones to right foot in Sept, Dec broke left foot), numbness in her feet. Uses cane or walker sometimes, didn't bring today. PT does her no good. Claims went after seeing Dr. Jannifer Franklin.   HISTORY  10/29/2020 Dr. Jannifer Franklin:  [name] is a 81 year old right-handed white female with a history of a mild memory disturbance.  She has been relatively stable with this, she is on Aricept and Namenda currently.  She comes in today with some gradual worsening problems with her balance over the last year.  She reports ongoing numbness of the toes.  She has a lot of leg cramps at night while sleeping, she will take mustard for this.  She has had several falls, she actually fractured her right foot several months ago.  She has a cane and a walker but does not use this consistently when she is outside of the house.  She claims she did undergo physical therapy without much benefit a year ago for her walking.  She does report chronic low back pain without radiation down the legs.  She does have some problems with urinary incontinence and is on medication for this with some benefit.  She has not given up any activities daily living because of memory, she continues to operate a motor vehicle.  She returns for an evaluation.  REVIEW OF SYSTEMS: Out of a complete 14 system review of symptoms, the patient complains only of the following symptoms, and all other reviewed systems are negative.  See HPI  ALLERGIES: Allergies  Allergen Reactions   Atorvastatin Other (See Comments)    Muscle cramps   Meperidine Hcl Nausea And Vomiting    Makes me 'deathly' sick   Statins Other (See Comments)    REACTION: Patient cannot tolerate due cramps   Norco [Hydrocodone-Acetaminophen] Nausea And Vomiting   Sulfonamide Derivatives Other (See Comments)  Unknown reaction    HOME MEDICATIONS: Outpatient Medications Prior to Visit  Medication Sig Dispense Refill   albuterol (VENTOLIN HFA) 108 (90 Base) MCG/ACT inhaler Inhale 2 puffs into the lungs every 6 (six) hours as needed for wheezing or shortness of breath. 8 g 0   aspirin 81 MG tablet Take 81 mg by mouth daily.     busPIRone (BUSPAR) 30 MG tablet Take 0.5-1 tablets (15-30 mg total) by mouth  daily as needed. TAKE (1/2) TO (1) TABLET BY MOUTH ONCE DAILY AS NEEDED 90 tablet 3   Calcium Carbonate-Vitamin D 500-125 MG-UNIT TABS Take 1 capsule by mouth daily.     Cyanocobalamin (VITAMIN B 12 PO) Take 1 tablet by mouth daily.      dimenhyDRINATE (DRAMAMINE) 50 MG tablet Take 50 mg by mouth every 8 (eight) hours as needed.     donepezil (ARICEPT) 10 MG tablet Take 1 tablet (10 mg total) by mouth at bedtime. 90 tablet 3   ezetimibe (ZETIA) 10 MG tablet Take 1 tablet (10 mg total) by mouth daily. 90 tablet 3   fluticasone (FLONASE) 50 MCG/ACT nasal spray Place 2 sprays into both nostrils daily. 16 g 6   gabapentin (NEURONTIN) 300 MG capsule Take 1 in the morning, take 2 at night 270 capsule 0   hydrochlorothiazide (HYDRODIURIL) 12.5 MG tablet Take 1 tablet (12.5 mg total) by mouth daily. 90 tablet 3   ibuprofen (ADVIL,MOTRIN) 200 MG tablet Take 800 mg by mouth every 6 (six) hours as needed (Pain).      levothyroxine (SYNTHROID) 125 MCG tablet Take 1 tablet (125 mcg total) by mouth daily. 90 tablet 3   lisinopril (ZESTRIL) 2.5 MG tablet Take 1 tablet (2.5 mg total) by mouth daily. 90 tablet 3   mirabegron ER (MYRBETRIQ) 25 MG TB24 tablet Take 1 tablet (25 mg total) by mouth daily. 90 tablet 3   Multiple Vitamin (MULTIVITAMIN) capsule Take 1 capsule by mouth daily.     Omega-3 Fatty Acids (FISH OIL) 1000 MG CAPS Take 1,000 mg by mouth daily.     amoxicillin-clavulanate (AUGMENTIN) 875-125 MG tablet Take 1 tablet by mouth 2 (two) times daily. 14 tablet 0   pantoprazole (PROTONIX) 20 MG tablet TAKE ONE TABLET BY MOUTH DAILY. 30 tablet 2   No facility-administered medications prior to visit.    PAST MEDICAL HISTORY: Past Medical History:  Diagnosis Date   Anal or rectal pain    Anxiety state, unspecified    Arthritis    Arthropathy, unspecified, site unspecified    Blood transfusion without reported diagnosis    Carpal tunnel syndrome    Bilateral   Cataract    Right 7 Left eye remved  cateracts   Chronic airway obstruction, not elsewhere classified    patient denies on 02/27/14   Esophageal reflux    Esophageal stricture    Hypertension    Internal hemorrhoids without mention of complication    Malignant neoplasm of breast (female), unspecified site    left breast    Memory disorder 09/24/2013   Moderate obstructive sleep apnea 01/20/2021   Nocturnal leg cramps 06/29/2018   Osteopenia    Other and unspecified hyperlipidemia    Pain in joint, multiple sites    Personal history of colonic polyps    adenomatous   Polyneuropathy in other diseases classified elsewhere ([place]) 09/24/2013   Shingles    Sleep apnea    no longer uses cpap   Tubular adenoma of colon    Unspecified  hypothyroidism     PAST SURGICAL HISTORY: Past Surgical History:  Procedure Laterality Date   APPENDECTOMY  03/2014   BACK SURGERY     X2   BREAST LUMPECTOMY     COLONOSCOPY  08/03/2018   EYE SURGERY     cataracts bilateral   LAPAROSCOPIC APPENDECTOMY N/A 02/28/2014   Procedure: APPENDECTOMY LAPAROSCOPIC PARTIAL CECECTOMY FOR MUCOCELE OF APPENDIX ;  Surgeon: Michael Boston, MD;  Location: [facility];  Service: General;  Laterality: N/A;   MASTECTOMY     left   POLYPECTOMY     THYROIDECTOMY     TUBAL LIGATION     UPPER GASTROINTESTINAL ENDOSCOPY      FAMILY HISTORY: Family History  Problem Relation Age of Onset   Bipolar disorder Sister    Early death Mother    Early death Sister    Colon cancer Neg Hx    Esophageal cancer Neg Hx    Rectal cancer Neg Hx    Stomach cancer Neg Hx     SOCIAL HISTORY: Social History   Socioeconomic History   Marital status: Divorced    Spouse name: Not on file   Number of children: 2   Years of education: Not on file   Highest education level: Not on file  Occupational History   Occupation: Retired    Fish farm manager: RETIRED  Tobacco Use   Smoking status: Never   Smokeless tobacco: Never  Vaping Use   Vaping Use: Never used  Substance and  Sexual Activity   Alcohol use: No    Alcohol/week: 0.0 standard drinks of alcohol   Drug use: No   Sexual activity: Never    Birth control/protection: Post-menopausal  Other Topics Concern   Not on file  Social History Narrative   Patient lives at home alone and she is divorced.   Retired.   Education business course.   Right handed.   Caffeine sometimes tea.    Social Determinants of Health   Financial Resource Strain: Low Risk  (12/05/2020)   Overall Financial Resource Strain (CARDIA)    Difficulty of Paying Living Expenses: Not very hard  Food Insecurity: No Food Insecurity (12/05/2020)   Hunger Vital Sign    Worried About Running Out of Food in the Last Year: Never true    Ran Out of Food in the Last Year: Never true  Transportation Needs: No Transportation Needs (12/05/2020)   PRAPARE - Hydrologist (Medical): No    Lack of Transportation (Non-Medical): No  Physical Activity: Insufficiently Active (09/21/2017)   Exercise Vital Sign    Days of Exercise per Week: 4 days    Minutes of Exercise per Session: 20 min  Stress: No Stress Concern Present (09/21/2017)   [place]    Feeling of Stress : Not at all  Social Connections: Moderately Integrated (12/05/2020)   Social Connection and Isolation Panel [NHANES]    Frequency of Communication with Friends and Family: More than three times a week    Frequency of Social Gatherings with Friends and Family: More than three times a week    Attends Religious Services: More than 4 times per year    Active Member of Clubs or Organizations: Yes    Attends Archivist Meetings: More than 4 times per year    Marital Status: Divorced  Intimate Partner Violence: Not on file    PHYSICAL EXAM  Vitals:   11/18/21 0929  BP: Marland Kitchen)  148/67  Pulse: (!) 51  Weight: 156 lb 8 oz (71 kg)  Height: 5' 9.6" (1.768 m)    Body mass index is 22.71  kg/m.    11/18/2021    9:35 AM 05/06/2021    9:06 AM 10/29/2020   11:52 AM  MMSE - Mini Mental State Exam  Orientation to time '5 4 5  '$ Orientation to Place '5 5 5  '$ Registration '3 3 3  '$ Attention/ Calculation '4 5 5  '$ Recall '3 3 3  '$ Language- name 2 objects '2 2 2  '$ Language- repeat '1 1 1  '$ Language- follow 3 step command '3 3 2  '$ Language- read & follow direction '1 1 1  '$ Write a sentence '1 1 1  '$ Copy design 1 1 0  Total score '29 29 28    '$ Generalized: Well developed, in no acute distress  Neurological examination  Mentation: Alert oriented to time, place, history taking. Follows all commands speech and language fluent Cranial nerve II-XII: Pupils were equal round reactive to light. Extraocular movements were full, visual field were full on confrontational test. Facial sensation and strength were normal.  Head turning and shoulder shrug  were normal and symmetric. Motor: Good strength overall, except left hip flexion 4/5 Sensory: Soft touch sensation is symmetric to the face, arms, legs Coordination: Cerebellar testing reveals good finger-nose-finger and heel-to-shin bilaterally.  Gait and station: Gait is wide based, steady, independent Reflexes: Deep tendon reflexes are symmetric and normal  DIAGNOSTIC DATA (LABS, IMAGING, TESTING) - I reviewed patient records, labs, notes, testing and imaging myself where available.  Lab Results  Component Value Date   WBC 5.9 04/03/2021   HGB 12.6 04/03/2021   HCT 37.5 04/03/2021   MCV 91 04/03/2021   PLT 242 04/03/2021      Component Value Date/Time   NA 140 04/03/2021 0933   NA 144 04/23/2013 1549   K 4.2 04/03/2021 0933   K 3.5 04/23/2013 1549   CL 98 04/03/2021 0933   CO2 23 04/03/2021 0933   CO2 25 04/23/2013 1549   GLUCOSE 81 04/03/2021 0933   GLUCOSE 99 07/02/2019 0823   GLUCOSE 97 04/23/2013 1549   BUN 16 04/03/2021 0933   BUN 20.1 04/23/2013 1549   CREATININE 0.70 04/03/2021 0933   CREATININE 0.7 04/23/2013 1549   CALCIUM 9.5  04/03/2021 0933   CALCIUM 10.3 04/23/2013 1549   PROT 6.6 04/03/2021 0933   PROT 6.8 04/23/2013 1549   ALBUMIN 4.5 04/03/2021 0933   ALBUMIN 4.1 04/23/2013 1549   AST 25 04/03/2021 0933   AST 31 04/23/2013 1549   ALT 17 04/03/2021 0933   ALT 32 04/23/2013 1549   ALKPHOS 71 04/03/2021 0933   ALKPHOS 74 04/23/2013 1549   BILITOT 0.7 04/03/2021 0933   BILITOT 0.53 04/23/2013 1549   GFRNONAA >60 07/02/2019 0815   GFRNONAA >89 07/10/2012 1056   GFRAA >60 07/02/2019 0815   GFRAA >89 07/10/2012 1056   Lab Results  Component Value Date   CHOL 234 (H) 04/03/2021   HDL 53 04/03/2021   LDLCALC 167 (H) 04/03/2021   LDLDIRECT 164 (H) 06/07/2014   TRIG 81 04/03/2021   CHOLHDL 4.4 04/03/2021   Lab Results  Component Value Date   HGBA1C 5.2 09/28/2013   Lab Results  Component Value Date   VITAMINB12 1,831 (H) 06/09/2018   Lab Results  Component Value Date   TSH 12.400 (H) 04/03/2021    Butler Denmark, AGNP-C, DNP 11/18/2021, 9:43 AM [facility] [id number] [place], [place] [address] [phone]

## 2021-11-18 ENCOUNTER — Encounter: Payer: Self-pay | Admitting: Neurology

## 2021-11-18 ENCOUNTER — Ambulatory Visit: Payer: Medicare Other | Admitting: Neurology

## 2021-11-18 VITALS — BP 148/67 | HR 51 | Ht 69.6 in | Wt 156.5 lb

## 2021-11-18 DIAGNOSIS — G63 Polyneuropathy in diseases classified elsewhere: Secondary | ICD-10-CM | POA: Diagnosis not present

## 2021-11-18 DIAGNOSIS — R413 Other amnesia: Secondary | ICD-10-CM | POA: Diagnosis not present

## 2021-11-18 DIAGNOSIS — R269 Unspecified abnormalities of gait and mobility: Secondary | ICD-10-CM | POA: Diagnosis not present

## 2021-11-18 MED ORDER — MEMANTINE HCL 5 MG PO TABS: 5.0000 mg | ORAL_TABLET | Freq: Two times a day (BID) | ORAL | 5 refills | Status: DC

## 2021-11-18 MED ORDER — GABAPENTIN 300 MG PO CAPS: ORAL_CAPSULE | ORAL | 1 refills | Status: DC

## 2021-11-18 MED ORDER — DONEPEZIL HCL 10 MG PO TABS: 10.0000 mg | ORAL_TABLET | Freq: Every day | ORAL | 3 refills | Status: DC

## 2021-11-18 NOTE — Patient Instructions (Signed)
We can restart the Namenda for your memory at your request, watch for dizziness or gait instability We will continue the Aricept and gabapentin Follow up with orthopedics if you continue to have back pain/hip pain See you back in 6 months

## 2021-12-07 ENCOUNTER — Ambulatory Visit (INDEPENDENT_AMBULATORY_CARE_PROVIDER_SITE_OTHER): Payer: Medicare Other | Admitting: Family Medicine

## 2021-12-07 ENCOUNTER — Ambulatory Visit (HOSPITAL_COMMUNITY)
Admission: RE | Admit: 2021-12-07 | Discharge: 2021-12-07 | Disposition: A | Discharge status: Home / Self Care | Payer: Medicare Other | Source: Ambulatory Visit | Attending: Family Medicine | Admitting: Family Medicine

## 2021-12-07 ENCOUNTER — Encounter: Payer: Self-pay | Admitting: Family Medicine

## 2021-12-07 VITALS — BP 137/66 | HR 82 | Ht 69.0 in | Wt 156.0 lb

## 2021-12-07 DIAGNOSIS — G44319 Acute post-traumatic headache, not intractable: Secondary | ICD-10-CM

## 2021-12-07 DIAGNOSIS — E7800 Pure hypercholesterolemia, unspecified: Secondary | ICD-10-CM | POA: Diagnosis not present

## 2021-12-07 DIAGNOSIS — E890 Postprocedural hypothyroidism: Secondary | ICD-10-CM | POA: Diagnosis not present

## 2021-12-07 DIAGNOSIS — N3281 Overactive bladder: Secondary | ICD-10-CM | POA: Diagnosis not present

## 2021-12-07 DIAGNOSIS — G319 Degenerative disease of nervous system, unspecified: Secondary | ICD-10-CM | POA: Diagnosis not present

## 2021-12-07 DIAGNOSIS — S022XXA Fracture of nasal bones, initial encounter for closed fracture: Secondary | ICD-10-CM | POA: Diagnosis not present

## 2021-12-07 DIAGNOSIS — I6782 Cerebral ischemia: Secondary | ICD-10-CM | POA: Diagnosis not present

## 2021-12-07 DIAGNOSIS — R739 Hyperglycemia, unspecified: Secondary | ICD-10-CM | POA: Diagnosis not present

## 2021-12-07 DIAGNOSIS — I10 Essential (primary) hypertension: Secondary | ICD-10-CM

## 2021-12-07 DIAGNOSIS — Z23 Encounter for immunization: Secondary | ICD-10-CM | POA: Diagnosis not present

## 2021-12-07 DIAGNOSIS — I639 Cerebral infarction, unspecified: Secondary | ICD-10-CM | POA: Diagnosis not present

## 2021-12-07 DIAGNOSIS — J439 Emphysema, unspecified: Secondary | ICD-10-CM | POA: Diagnosis not present

## 2021-12-07 MED ORDER — MIRABEGRON ER 25 MG PO TB24: 25.0000 mg | ORAL_TABLET | Freq: Every day | ORAL | 3 refills | Status: DC

## 2021-12-07 NOTE — Progress Notes (Signed)
BP 137/66   Pulse 82   Ht 5' 9" (1.753 m)   Wt 156 lb (70.8 kg)   LMP  (LMP Unknown)   SpO2 97%   BMI 23.04 kg/m    Subjective:   Patient ID: [name], female    DOB: Feb 20, 1940, 81 y.o.   MRN: [id number]  HPI: [name] is a 81 y.o. female presenting on 12/07/2021 for Medical Management of Chronic Issues, Hypertension, and Hypothyroidism   HPI Hypertension Patient is currently on hydrochlorothiazide/lisinopril, and their blood pressure today is 136/66. Patient denies any lightheadedness or dizziness. Patient denies blurred vision, chest pains, shortness of breath, or weakness. Denies any side effects from medication and is content with current medication.   Hypothyroidism recheck Patient is coming in for thyroid recheck today as well. They deny any issues with hair changes or heat or cold problems or diarrhea or constipation. They deny any chest pain or palpitations. They are currently on levothyroxine 125 micrograms   Hyperlipidemia Patient is coming in for recheck of his hyperlipidemia. The patient is currently taking fish oils and Zetia. They deny any issues with myalgias or history of liver damage from it. They deny any focal numbness or weakness or chest pain.   Patient had a recent fall 10 days ago where she fell and hit the front of her head just above her eye and she has been having these recurrent headaches.  The bruising was quite significant initially and she had a black eye but now that is healing but she still having these recurrent headaches that she has not been having before and she is concerned because somebody in her family member had similar symptoms and then had a bleed or a clot behind the eye.  Overactive bladder recheck Patient has overactive bladder and has been taking Myrbetriq for this.  This has been helping she does like the medicine.  She denies any side effects.  Relevant past medical, surgical, family and social history reviewed and updated as  indicated. Interim medical history since our last visit reviewed. Allergies and medications reviewed and updated.  Review of Systems  Constitutional:  Negative for chills and fever.  Eyes:  Negative for visual disturbance.  Respiratory:  Negative for chest tightness and shortness of breath.   Cardiovascular:  Negative for chest pain and leg swelling.  Genitourinary:  Negative for difficulty urinating and dysuria.  Musculoskeletal:  Negative for back pain and gait problem.  Skin:  Positive for color change. Negative for rash.  Neurological:  Positive for headaches. Negative for dizziness and light-headedness.  Psychiatric/Behavioral:  Negative for agitation and behavioral problems.   All other systems reviewed and are negative.   Per HPI unless specifically indicated above   Allergies as of 12/07/2021       Reactions   Atorvastatin Other (See Comments)   Muscle cramps   Meperidine Hcl Nausea And Vomiting   Makes me 'deathly' sick   Statins Other (See Comments)   REACTION: Patient cannot tolerate due cramps   Norco [hydrocodone-acetaminophen] Nausea And Vomiting   Sulfonamide Derivatives Other (See Comments)   Unknown reaction        Medication List        Accurate as of December 07, 2021 12:02 PM. If you have any questions, ask your nurse or doctor.          albuterol 108 (90 Base) MCG/ACT inhaler Commonly known as: VENTOLIN HFA Inhale 2 puffs into the lungs every 6 (six) hours as  needed for wheezing or shortness of breath.   aspirin 81 MG tablet Take 81 mg by mouth daily.   busPIRone 30 MG tablet Commonly known as: BUSPAR Take 0.5-1 tablets (15-30 mg total) by mouth daily as needed. TAKE (1/2) TO (1) TABLET BY MOUTH ONCE DAILY AS NEEDED   Calcium Carbonate-Vitamin D 500-125 MG-UNIT Tabs Take 1 capsule by mouth daily.   dimenhyDRINATE 50 MG tablet Commonly known as: DRAMAMINE Take 50 mg by mouth every 8 (eight) hours as needed.   donepezil 10 MG  tablet Commonly known as: ARICEPT Take 1 tablet (10 mg total) by mouth at bedtime.   ezetimibe 10 MG tablet Commonly known as: ZETIA Take 1 tablet (10 mg total) by mouth daily.   Fish Oil 1000 MG Caps Take 1,000 mg by mouth daily.   fluticasone 50 MCG/ACT nasal spray Commonly known as: FLONASE Place 2 sprays into both nostrils daily.   gabapentin 300 MG capsule Commonly known as: NEURONTIN Take 1 in the morning, take 2 at night   hydrochlorothiazide 12.5 MG tablet Commonly known as: HYDRODIURIL Take 1 tablet (12.5 mg total) by mouth daily.   ibuprofen 200 MG tablet Commonly known as: ADVIL Take 800 mg by mouth every 6 (six) hours as needed (Pain).   levothyroxine 125 MCG tablet Commonly known as: SYNTHROID Take 1 tablet (125 mcg total) by mouth daily.   lisinopril 2.5 MG tablet Commonly known as: Zestril Take 1 tablet (2.5 mg total) by mouth daily.   memantine 5 MG tablet Commonly known as: Namenda Take 1 tablet (5 mg total) by mouth 2 (two) times daily.   mirabegron ER 25 MG Tb24 tablet Commonly known as: Myrbetriq Take 1 tablet (25 mg total) by mouth daily.   multivitamin capsule Take 1 capsule by mouth daily.   VITAMIN B 12 PO Take 1 tablet by mouth daily.         Objective:   BP 137/66   Pulse 82   Ht 5' 9" (1.753 m)   Wt 156 lb (70.8 kg)   LMP  (LMP Unknown)   SpO2 97%   BMI 23.04 kg/m   Wt Readings from Last 3 Encounters:  12/07/21 156 lb (70.8 kg)  11/18/21 156 lb 8 oz (71 kg)  05/19/21 164 lb 3.2 oz (74.5 kg)    Physical Exam Vitals and nursing note reviewed.  Constitutional:      General: She is not in acute distress.    Appearance: She is well-developed. She is not diaphoretic.  HENT:     Head: Contusion (Left periorbital contusion, healing, yellow) present.   Eyes:     Extraocular Movements:     Right eye: Normal extraocular motion.     Left eye: Normal extraocular motion.     Conjunctiva/sclera: Conjunctivae normal.      Right eye: Right conjunctiva is not injected.     Pupils: Pupils are equal, round, and reactive to light.  Cardiovascular:     Rate and Rhythm: Normal rate and regular rhythm.     Heart sounds: Normal heart sounds. No murmur heard. Pulmonary:     Effort: Pulmonary effort is normal. No respiratory distress.     Breath sounds: Normal breath sounds. No wheezing.  Musculoskeletal:        General: No tenderness. Normal range of motion.  Skin:    General: Skin is warm and dry.     Findings: No rash.  Neurological:     Mental Status: She is alert and  oriented to person, place, and time.     Coordination: Coordination normal.  Psychiatric:        Behavior: Behavior normal.       Assessment & Plan:   Problem List Items Addressed This Visit       Cardiovascular and Mediastinum   HTN (hypertension)   Relevant Orders   CBC with Differential/Platelet   CMP14+EGFR     Respiratory   COPD (chronic obstructive pulmonary disease) ([place])   Relevant Orders   CBC with Differential/Platelet     Endocrine   Hypothyroidism, postsurgical - Primary   Relevant Orders   TSH     Genitourinary   OAB (overactive bladder)   Relevant Medications   mirabegron ER (MYRBETRIQ) 25 MG TB24 tablet   Other Relevant Orders   CBC with Differential/Platelet     Other   Hyperlipidemia   Relevant Orders   Lipid panel   Other Visit Diagnoses     Acute post-traumatic headache, not intractable       Relevant Orders   CT HEAD WO CONTRAST (5MM)     Because of age and being more fragile and her having recurrent headaches that are worsening, will order CT head.  She had trauma just above the left face from a fall.  She is working on exercises and has a therapy class that she goes to twice a week.  Will check blood work, continue current medicine, no changes.  Follow up plan: Return in about 3 months (around 03/08/2022), or if symptoms worsen or fail to improve, for Thyroid recheck.  Counseling provided  for all of the vaccine components Orders Placed This Encounter  Procedures   CT HEAD WO CONTRAST (5MM)   CBC with Differential/Platelet   CMP14+EGFR   Lipid panel   TSH    Caryl Pina, MD [facility] 12/07/2021, 12:02 PM

## 2021-12-07 NOTE — Addendum Note (Signed)
Addended by: Alphonzo Dublin on: 12/07/2021 12:08 PM   Modules accepted: Orders

## 2021-12-08 LAB — CMP14+EGFR
ALT: 14 IU/L (ref 0–32)
AST: 20 IU/L (ref 0–40)
Albumin/Globulin Ratio: 2.1 (ref 1.2–2.2)
Albumin: 4.2 g/dL (ref 3.8–4.8)
Alkaline Phosphatase: 50 IU/L (ref 44–121)
BUN/Creatinine Ratio: 28 (ref 12–28)
BUN: 16 mg/dL (ref 8–27)
Bilirubin Total: 0.5 mg/dL (ref 0.0–1.2)
CO2: 26 mmol/L (ref 20–29)
Calcium: 9.1 mg/dL (ref 8.7–10.3)
Chloride: 103 mmol/L (ref 96–106)
Creatinine, Ser: 0.57 mg/dL (ref 0.57–1.00)
Globulin, Total: 2 g/dL (ref 1.5–4.5)
Glucose: 84 mg/dL (ref 70–99)
Potassium: 4.3 mmol/L (ref 3.5–5.2)
Sodium: 142 mmol/L (ref 134–144)
Total Protein: 6.2 g/dL (ref 6.0–8.5)
eGFR: 92 mL/min/{1.73_m2} (ref >59)

## 2021-12-08 LAB — CBC WITH DIFFERENTIAL/PLATELET
Basophils Absolute: 0 10*3/uL (ref 0.0–0.2)
Basos: 1 %
EOS (ABSOLUTE): 0.2 10*3/uL (ref 0.0–0.4)
Eos: 4 %
Hematocrit: 35.7 % (ref 34.0–46.6)
Hemoglobin: 12.2 g/dL (ref 11.1–15.9)
Immature Grans (Abs): 0 10*3/uL (ref 0.0–0.1)
Immature Granulocytes: 0 %
Lymphocytes Absolute: 1.6 10*3/uL (ref 0.7–3.1)
Lymphs: 32 %
MCH: 31.1 pg (ref 26.6–33.0)
MCHC: 34.2 g/dL (ref 31.5–35.7)
MCV: 91 fL (ref 79–97)
Monocytes Absolute: 0.4 10*3/uL (ref 0.1–0.9)
Monocytes: 8 %
Neutrophils Absolute: 2.8 10*3/uL (ref 1.4–7.0)
Neutrophils: 55 %
Platelets: 236 10*3/uL (ref 150–450)
RBC: 3.92 x10E6/uL (ref 3.77–5.28)
RDW: 13 % (ref 11.7–15.4)
WBC: 5.1 10*3/uL (ref 3.4–10.8)

## 2021-12-08 LAB — TSH: TSH: 5.44 u[IU]/mL — ABNORMAL HIGH (ref 0.450–4.500)

## 2021-12-08 LAB — LIPID PANEL
Chol/HDL Ratio: 3.6 ratio (ref 0.0–4.4)
Cholesterol, Total: 197 mg/dL (ref 100–199)
HDL: 55 mg/dL (ref >39)
LDL Chol Calc (NIH): 121 mg/dL — ABNORMAL HIGH (ref 0–99)
Triglycerides: 117 mg/dL (ref 0–149)
VLDL Cholesterol Cal: 21 mg/dL (ref 5–40)

## 2021-12-08 LAB — BAYER DCA HB A1C WAIVED: HB A1C (BAYER DCA - WAIVED): 5.2 % (ref 4.8–5.6)

## 2021-12-16 ENCOUNTER — Other Ambulatory Visit: Payer: Self-pay

## 2021-12-16 MED ORDER — LEVOTHYROXINE SODIUM 137 MCG PO TABS: 137.0000 ug | ORAL_TABLET | Freq: Every day | ORAL | 1 refills | Status: DC

## 2022-02-14 DIAGNOSIS — R079 Chest pain, unspecified: Secondary | ICD-10-CM | POA: Insufficient documentation

## 2022-02-14 NOTE — Progress Notes (Unsigned)
Cardiology Office Note   Date:  02/14/2022   ID:  [name], DOB 14-Sep-1940, MRN [id number]  PCP:  Dettinger, Fransisca Kaufmann, MD  Cardiologist:   None   No chief complaint on file.     History of Present Illness: [name] is a 82 y.o. female who presents for follow up of a presyncopal episode.  I saw her for chest pain in 2017.  She had a negative perfusion study.   In August 2019 she had episode of presyncope.  He has severe sleep apnea.  At the last visit she had a negative perfusion study for evaluation of chest pain. ***    *** She sees Dr. Claiborne Billings asevere sleep apnea has never been instructed on how to use it.    At her last primary care office visit she was complaining of weakness and shortness of breath.  She was added to my schedule this morning because of these acute complaints.  She really has multiple complaints.  She is actually about to have an EGD because of nausea.  When she saw.  Her biggest complaint was severe fatigue.  She felt so weak on his days that she thought she was passed out.  He described vomiting.  She describes sharp shooting pain under her right breast.  She describes sporadic other discomfort and said it was severe on Christmas Eve.  She really cannot qualify or quantify.  She does not describe associated diaphoresis.  She says she is chronically short of breath.  She said she has nausea and vomiting as described.  None of the symptoms necessarily seem to be associated.  Her biggest complaint really seems to be 15 other she has multiple somatic complaints.  I do note from previous CT of her chest that she has aortic atherosclerosis and mention of coronary calcium.   Past Medical History:  Diagnosis Date   Anal or rectal pain    Anxiety state, unspecified    Arthritis    Arthropathy, unspecified, site unspecified    Blood transfusion without reported diagnosis    Carpal tunnel syndrome    Bilateral   Cataract    Right 7 Left eye remved cateracts    Chronic airway obstruction, not elsewhere classified    patient denies on 02/27/14   Esophageal reflux    Esophageal stricture    Hypertension    Internal hemorrhoids without mention of complication    Malignant neoplasm of breast (female), unspecified site    left breast    Memory disorder 09/24/2013   Moderate obstructive sleep apnea 01/20/2021   Nocturnal leg cramps 06/29/2018   Osteopenia    Other and unspecified hyperlipidemia    Pain in joint, multiple sites    Personal history of colonic polyps    adenomatous   Polyneuropathy in other diseases classified elsewhere ([place]) 09/24/2013   Shingles    Sleep apnea    no longer uses cpap   Tubular adenoma of colon    Unspecified hypothyroidism     Past Surgical History:  Procedure Laterality Date   APPENDECTOMY  03/2014   BACK SURGERY     X2   BREAST LUMPECTOMY     COLONOSCOPY  08/03/2018   EYE SURGERY     cataracts bilateral   LAPAROSCOPIC APPENDECTOMY N/A 02/28/2014   Procedure: APPENDECTOMY LAPAROSCOPIC PARTIAL CECECTOMY FOR MUCOCELE OF APPENDIX ;  Surgeon: Michael Boston, MD;  Location: [facility];  Service: General;  Laterality: N/A;   MASTECTOMY  left   POLYPECTOMY     THYROIDECTOMY     TUBAL LIGATION     UPPER GASTROINTESTINAL ENDOSCOPY       Current Outpatient Medications  Medication Sig Dispense Refill   albuterol (VENTOLIN HFA) 108 (90 Base) MCG/ACT inhaler Inhale 2 puffs into the lungs every 6 (six) hours as needed for wheezing or shortness of breath. 8 g 0   aspirin 81 MG tablet Take 81 mg by mouth daily.     busPIRone (BUSPAR) 30 MG tablet Take 0.5-1 tablets (15-30 mg total) by mouth daily as needed. TAKE (1/2) TO (1) TABLET BY MOUTH ONCE DAILY AS NEEDED 90 tablet 3   Calcium Carbonate-Vitamin D 500-125 MG-UNIT TABS Take 1 capsule by mouth daily.     Cyanocobalamin (VITAMIN B 12 PO) Take 1 tablet by mouth daily.      dimenhyDRINATE (DRAMAMINE) 50 MG tablet Take 50 mg by mouth every 8 (eight) hours as  needed.     donepezil (ARICEPT) 10 MG tablet Take 1 tablet (10 mg total) by mouth at bedtime. 90 tablet 3   ezetimibe (ZETIA) 10 MG tablet Take 1 tablet (10 mg total) by mouth daily. 90 tablet 3   fluticasone (FLONASE) 50 MCG/ACT nasal spray Place 2 sprays into both nostrils daily. 16 g 6   gabapentin (NEURONTIN) 300 MG capsule Take 1 in the morning, take 2 at night 270 capsule 1   hydrochlorothiazide (HYDRODIURIL) 12.5 MG tablet Take 1 tablet (12.5 mg total) by mouth daily. 90 tablet 3   ibuprofen (ADVIL,MOTRIN) 200 MG tablet Take 800 mg by mouth every 6 (six) hours as needed (Pain).      levothyroxine (SYNTHROID) 137 MCG tablet Take 1 tablet (137 mcg total) by mouth daily before breakfast. 90 tablet 1   lisinopril (ZESTRIL) 2.5 MG tablet Take 1 tablet (2.5 mg total) by mouth daily. 90 tablet 3   memantine (NAMENDA) 5 MG tablet Take 1 tablet (5 mg total) by mouth 2 (two) times daily. 60 tablet 5   mirabegron ER (MYRBETRIQ) 25 MG TB24 tablet Take 1 tablet (25 mg total) by mouth daily. 90 tablet 3   Multiple Vitamin (MULTIVITAMIN) capsule Take 1 capsule by mouth daily.     Omega-3 Fatty Acids (FISH OIL) 1000 MG CAPS Take 1,000 mg by mouth daily.     No current facility-administered medications for this visit.    Allergies:   Atorvastatin, Meperidine hcl, Statins, Norco [hydrocodone-acetaminophen], and Sulfonamide derivatives   ROS:  Please see the history of present illness.   Otherwise, review of systems are positive for ***.   All other systems are reviewed and negative.    PHYSICAL EXAM: VS:  LMP  (LMP Unknown)  , BMI There is no height or weight on file to calculate BMI. GENERAL:  Well appearing NECK:  No jugular venous distention, waveform within normal limits, carotid upstroke brisk and symmetric, no bruits, no thyromegaly LUNGS:  Clear to auscultation bilaterally CHEST:  Unremarkable HEART:  PMI not displaced or sustained,S1 and S2 within normal limits, no S3, no S4, no clicks, no  rubs, *** murmurs ABD:  Flat, positive bowel sounds normal in frequency in pitch, no bruits, no rebound, no guarding, no midline pulsatile mass, no hepatomegaly, no splenomegaly EXT:  2 plus pulses throughout, no edema, no cyanosis no clubbing    ***GENERAL:  Well appearing NECK:  No jugular venous distention, waveform within normal limits, carotid upstroke brisk and symmetric, no bruits, no thyromegaly LUNGS:  Clear to auscultation  bilaterally CHEST:  Unremarkable HEART:  PMI not displaced or sustained,S1 and S2 within normal limits, no S3, no S4, no clicks, no rubs, no murmurs ABD:  Flat, positive bowel sounds normal in frequency in pitch, no bruits, no rebound, no guarding, no midline pulsatile mass, no hepatomegaly, no splenomegaly EXT:  2 plus pulses throughout, no edema, no cyanosis no clubbing   EKG:  EKG is *** ordered today. 01/01/2021 sinus rhythm rate ***, axis within normal limits, intervals within normal premature ectopic complexes, no acute ST-T wave changes.   Recent Labs: 12/07/2021: ALT 14; BUN 16; Creatinine, Ser 0.57; Hemoglobin 12.2; Platelets 236; Potassium 4.3; Sodium 142; TSH 5.440    Lipid Panel    Component Value Date/Time   CHOL 197 12/07/2021 1227   CHOL 228 (H) 05/17/2012 1134   TRIG 117 12/07/2021 1227   TRIG 92 10/01/2015 1129   TRIG 88 05/17/2012 1134   HDL 55 12/07/2021 1227   HDL 61 10/01/2015 1129   HDL 55 05/17/2012 1134   CHOLHDL 3.6 12/07/2021 1227   LDLCALC 121 (H) 12/07/2021 1227   LDLCALC 161 (H) 09/28/2013 0823   LDLCALC 155 (H) 05/17/2012 1134   LDLDIRECT 164 (H) 06/07/2014 0858      Wt Readings from Last 3 Encounters:  12/07/21 156 lb (70.8 kg)  11/18/21 156 lb 8 oz (71 kg)  05/19/21 164 lb 3.2 oz (74.5 kg)      Other studies Reviewed: Additional studies/ records that were reviewed today include: ***.   Review of the above records demonstrates:  Please see elsewhere in the note.     ASSESSMENT AND PLAN:  SOB: ***  The  patient has multiple somatic complaints but does not describe chest discomfort.  She has no coronary calcium seen on previous CT incidentally.  I am going to send her for a stress test but she would not get with the treadmill so she will have a Lexiscan Myoview.  WEAKNESS:   ***  This has been present and chronic complaint.  I did note that her labs have been normal including CBC and TSH.  I did review her monitor that she had a few years ago and there was no significant bradycardia arrhythmias to explain this.  This may be related to her sleep apnea which was addressed as below.  BRADYCARDIA:   ***  She has not had syncope.  Has had mechanical falls.  She had no severe arrhythmias on previous monitor.  No further testing.  HTN:  The blood pressure is *** at target.  No change in therapy.  At target.  No change in therapy.   FATIGUE:    ***  I went back and reviewed notes from Dr. Claiborne Billings.   There is some confusion about her CPAP.  She has been waiting for instructions on how to use it per her report.  I will try to get her follow up and see if she cannot initiate this.  I saw mention that she did not tolerate it butt she denies this. Marland Kitchen   SLEEP APNEA:  ***  See above.   PRE PROCEDURE CARDIOVASCULAR CLEARANCE:  This will be pending the perfusion study    Current medicines are reviewed at length with the patient today.  The patient does not have concerns regarding medicines.  The following changes have been made:  no change  Labs/ tests ordered today include:   No orders of the defined types were placed in this encounter.    Disposition:  FU with me as needed.     Signed, Minus Breeding, MD  02/14/2022 9:44 PM    [facility]

## 2022-02-17 ENCOUNTER — Encounter: Payer: Self-pay | Admitting: Cardiology

## 2022-02-17 ENCOUNTER — Ambulatory Visit (INDEPENDENT_AMBULATORY_CARE_PROVIDER_SITE_OTHER): Payer: Medicare Other | Admitting: Cardiology

## 2022-02-17 VITALS — BP 118/70 | HR 58 | Ht 69.0 in | Wt 162.0 lb

## 2022-02-17 DIAGNOSIS — R0602 Shortness of breath: Secondary | ICD-10-CM | POA: Diagnosis not present

## 2022-02-17 DIAGNOSIS — R001 Bradycardia, unspecified: Secondary | ICD-10-CM | POA: Diagnosis not present

## 2022-02-17 DIAGNOSIS — I10 Essential (primary) hypertension: Secondary | ICD-10-CM | POA: Diagnosis not present

## 2022-02-17 DIAGNOSIS — R079 Chest pain, unspecified: Secondary | ICD-10-CM

## 2022-02-17 NOTE — Patient Instructions (Signed)
Medication Instructions:  The current medical regimen is effective;  continue present plan and medications.  *If you need a refill on your cardiac medications before your next appointment, please call your pharmacy*  Follow-Up: At [facility], you and your health needs are our priority.  As part of our continuing mission to provide you with exceptional heart care, we have created designated Provider Care Teams.  These Care Teams include your primary Cardiologist (physician) and Advanced Practice Providers (APPs -  Physician Assistants and Nurse Practitioners) who all work together to provide you with the care you need, when you need it.  We recommend signing up for the patient portal called "MyChart".  Sign up information is provided on this After Visit Summary.  MyChart is used to connect with patients for Virtual Visits (Telemedicine).  Patients are able to view lab/test results, encounter notes, upcoming appointments, etc.  Non-urgent messages can be sent to your provider as well.   To learn more about what you can do with MyChart, go to [email or web address].    Your next appointment:   Follow up as needed with Dr Dyann Kief device

## 2022-03-30 ENCOUNTER — Other Ambulatory Visit (HOSPITAL_COMMUNITY): Payer: Self-pay | Admitting: Family Medicine

## 2022-03-30 DIAGNOSIS — Z1231 Encounter for screening mammogram for malignant neoplasm of breast: Secondary | ICD-10-CM

## 2022-04-02 ENCOUNTER — Other Ambulatory Visit: Payer: Self-pay | Admitting: Family Medicine

## 2022-04-02 DIAGNOSIS — E7800 Pure hypercholesterolemia, unspecified: Secondary | ICD-10-CM

## 2022-04-02 DIAGNOSIS — I10 Essential (primary) hypertension: Secondary | ICD-10-CM

## 2022-04-07 ENCOUNTER — Telehealth: Payer: Self-pay | Admitting: Family Medicine

## 2022-04-07 DIAGNOSIS — E890 Postprocedural hypothyroidism: Secondary | ICD-10-CM

## 2022-04-07 NOTE — Telephone Encounter (Signed)
  Prescription Request  04/07/2022  Is this a "Controlled Substance" medicine?   Have you seen your PCP in the last 2 weeks?   If YES, route message to pool  -  If NO, patient needs to be scheduled for appointment.  What is the name of the medication or equipment levothyroxine (SYNTHROID) 137 MCG tablet  Have you contacted your pharmacy to request a refill? yes   Which pharmacy would you like this sent to?  [place], [place].      Patient notified that their request is being sent to the clinical staff for review and that they should receive a response within 2 business days.

## 2022-04-07 NOTE — Telephone Encounter (Signed)
Tried calling home number x2. Not working.  Called work number and the gentlemen said he was an ex. Not sure if it is the daughter's ex or Tyshika's? He lives next door to patient and he will try to call her or walk over. He will have her call the office back.

## 2022-04-07 NOTE — Telephone Encounter (Signed)
Talked w/ pt & daughter, she did not receive message about her Thyroid dose changing, she is trying to get a refill on her 125 mcg dose. She does not want to change her dose she feels that when it is increased it causes her hair to fall out. The pharmacy gave her 7-d worth until she got a RF from [place]. I am placing an order for a TSH for her to came have labs drawn since the last one which was abnormal was in December, she does not feel that she has missed any doses of the 125 mcg dose. We also talked about MyChart so she can get the results as well so she doesn't miss any medication changes in the future and she has her username now.

## 2022-04-07 NOTE — Telephone Encounter (Signed)
She may have had issues in the past with the higher dose but typically throughout 1 person's life they do eventually need a higher dose because the thyroid continues to produce less and less of its own hormone and then we have to increase the amount, I do recommend for her to go up to the levothyroxine 137 mcg, I would like her to try it and recheck it in 1 to 2 months and then we can evaluate from there.  I do not think the higher dose will cause the same issues that it has in the past because her body is needing more which means it is not producing its own as much which is common

## 2022-04-08 ENCOUNTER — Other Ambulatory Visit: Payer: Medicare Other

## 2022-04-08 DIAGNOSIS — E890 Postprocedural hypothyroidism: Secondary | ICD-10-CM | POA: Diagnosis not present

## 2022-04-09 LAB — TSH: TSH: 6.23 u[IU]/mL — ABNORMAL HIGH (ref 0.450–4.500)

## 2022-04-14 ENCOUNTER — Other Ambulatory Visit: Payer: Self-pay

## 2022-04-14 MED ORDER — LEVOTHYROXINE SODIUM 150 MCG PO TABS: 150.0000 ug | ORAL_TABLET | Freq: Every day | ORAL | 1 refills | Status: DC

## 2022-04-30 ENCOUNTER — Other Ambulatory Visit (HOSPITAL_COMMUNITY): Payer: Self-pay | Admitting: Family Medicine

## 2022-04-30 ENCOUNTER — Encounter (HOSPITAL_COMMUNITY): Payer: Self-pay

## 2022-04-30 ENCOUNTER — Ambulatory Visit (HOSPITAL_COMMUNITY)
Admission: RE | Admit: 2022-04-30 | Discharge: 2022-04-30 | Disposition: A | Discharge status: Home / Self Care | Payer: Medicare Other | Source: Ambulatory Visit | Attending: Family Medicine | Admitting: Family Medicine

## 2022-04-30 DIAGNOSIS — Z1231 Encounter for screening mammogram for malignant neoplasm of breast: Secondary | ICD-10-CM

## 2022-05-03 ENCOUNTER — Other Ambulatory Visit: Payer: Self-pay | Admitting: Family Medicine

## 2022-06-07 DIAGNOSIS — L570 Actinic keratosis: Secondary | ICD-10-CM | POA: Diagnosis not present

## 2022-06-07 DIAGNOSIS — D485 Neoplasm of uncertain behavior of skin: Secondary | ICD-10-CM | POA: Diagnosis not present

## 2022-06-08 ENCOUNTER — Ambulatory Visit: Payer: Medicare Other | Admitting: Neurology

## 2022-06-08 ENCOUNTER — Telehealth: Payer: Self-pay | Admitting: Neurology

## 2022-06-08 ENCOUNTER — Encounter: Payer: Self-pay | Admitting: Neurology

## 2022-06-08 VITALS — BP 172/72 | HR 67 | Ht 69.0 in | Wt 161.0 lb

## 2022-06-08 DIAGNOSIS — R269 Unspecified abnormalities of gait and mobility: Secondary | ICD-10-CM

## 2022-06-08 DIAGNOSIS — R519 Headache, unspecified: Secondary | ICD-10-CM | POA: Diagnosis not present

## 2022-06-08 DIAGNOSIS — G63 Polyneuropathy in diseases classified elsewhere: Secondary | ICD-10-CM | POA: Diagnosis not present

## 2022-06-08 DIAGNOSIS — G4452 New daily persistent headache (NDPH): Secondary | ICD-10-CM

## 2022-06-08 MED ORDER — GABAPENTIN 300 MG PO CAPS: ORAL_CAPSULE | ORAL | 1 refills | Status: DC

## 2022-06-08 NOTE — Patient Instructions (Signed)
Check labs today  Check MRI brain  Referral to Dr. Dione Booze, call to see if you can be seen sooner  Will keep you posted Get back to using your CPAP

## 2022-06-08 NOTE — Telephone Encounter (Signed)
Referral faxed to [facility]: Phone: [phone]  Fax: [phone]  

## 2022-06-08 NOTE — Telephone Encounter (Signed)
[employer] medicare NPR sent to GI [phone] 

## 2022-06-08 NOTE — Progress Notes (Signed)
Patient: [name] Date of Birth: [birth year]  Reason for Visit: Follow up neuropathy, gait, MCI History from: Patient, friend [name]  Primary Neurologist: Dr. Dalia Heading. Terrace Arabia   ASSESSMENT AND PLAN 82 y.o. year old female   1.  Gait disturbance 2.  Peripheral neuropathy 3.  Mild cognitive impairment 4.  Worsening gait, balance, new right sided headache, with blurry vision to right eye  -Check MRI brain to rule out acute change since her fall 2 months ago in the setting of daily headache right sided.  Reported worsening gait -Check ESR, CRP to rule out temporal arteritis given vision change -Referral to Dr. Dione Booze for follow-up of vision issues -She will remain on gabapentin 300/600, we discussed reducing to 300 mg twice daily see if any changes to her gait, she does not think an issue, will continue Aricept 10 mg daily, can readdress memory in the future, I see no evidence of any memory change -Have recommended nightly CPAP use, potentially some contribution to morning headache without using -She has known peripheral neuropathy, continues with her exercise classes, lives independent, mows and maintains her long -BP is up, needs to follow-up with PCP, keep a monitor at home -Follow-up in 4 months or sooner if needed with Dr. Jonni Sanger  HISTORY OF PRESENT ILLNESS: Today 06/08/22  Last visit, she restarted the Namenda, it didn't help the memory so she stopped it. Here with her friend, [name]. Today, she wants to discuss her balance for last few months, gradually getting worse. 2 months ago her legs gave out, she fell at her home, didn't hit her head, was coming back from the bathroom early in the morning. She couldn't get up for 30 minutes, claims she couldn't move her arms or legs for 30 minutes. Didn't get evaluated. Since then, wakes up with morning headache, trouble with right eye focusing. Goes to exercise twice a week, still works in her yard. Is concerned with not being able to balance  herself, getting up with headache. Here today with friend [name]. Feels is slower to walk. Cramps to right leg. Tingling in her finger tips from neuropathy. Has urinary incontinence on Myrbetriq, wears adult briefs. Remains on B12. Remains on gabapentin 300/600 for neuropathy. Remains on Aricept 10 mg at bedtime. Has a CPAP, doesn't wear it consistently, once a week wears.  She continues to drive short distances.  Update 11/18/21 SS: MMSE 29/30. Stopped the Namenda, feels less cloudy headed, but no change to balance. Potentially memory is worse, had trouble finding names. Lives alone, drives a car, no problems. Is independent. Still has poor balance, has muscle soreness/cramps. Has chronic back pain, hip pain. Has seen orthopedics. Takes gabapentin 300 mg AM, 600 mg at bedtime. Takes B 12. Has numbness in feet and hands. Yesterday she painted the doors in her house for 10 hours. Does senior exercise classes twice a week.   Update 05/06/21 SS: Ms. Trees here today for follow-up.  RPR, copper, MM panel were unremarkable. MMSE 29/30 today. Thinks memory doing well, is forgetful, but things might be normal, not given up anything due to memory. Lives alone. Doesn't drive much. Does her own ADL, housework, appointments, medications, finances. Active in church. Friend drove her today. On Aricept 10 mg bedtime, Namenda 5 mg twice daily. Thinks feeling unbalanced, unsteady, from Namenda she thinks? 2 bad falls in the winter (3 broken bones to right foot in Sept, Dec broke left foot), numbness in her feet. Uses cane or walker sometimes, didn't bring today. PT  does her no good. Claims went after seeing Dr. Anne Hahn.   HISTORY  10/29/2020 Dr. Anne Hahn: Ms. Mcclure is a 82 year old right-handed white female with a history of a mild memory disturbance.  She has been relatively stable with this, she is on Aricept and Namenda currently.  She comes in today with some gradual worsening problems with her balance over the last year.  She  reports ongoing numbness of the toes.  She has a lot of leg cramps at night while sleeping, she will take mustard for this.  She has had several falls, she actually fractured her right foot several months ago.  She has a cane and a walker but does not use this consistently when she is outside of the house.  She claims she did undergo physical therapy without much benefit a year ago for her walking.  She does report chronic low back pain without radiation down the legs.  She does have some problems with urinary incontinence and is on medication for this with some benefit.  She has not given up any activities daily living because of memory, she continues to operate a motor vehicle.  She returns for an evaluation.  REVIEW OF SYSTEMS: Out of a complete 14 system review of symptoms, the patient complains only of the following symptoms, and all other reviewed systems are negative.  See HPI  ALLERGIES: Allergies  Allergen Reactions   Atorvastatin Other (See Comments)    Muscle cramps   Meperidine Hcl Nausea And Vomiting    Makes me 'deathly' sick   Statins Other (See Comments)    REACTION: Patient cannot tolerate due cramps   Norco [Hydrocodone-Acetaminophen] Nausea And Vomiting   Sulfonamide Derivatives Other (See Comments)    Unknown reaction    HOME MEDICATIONS: Outpatient Medications Prior to Visit  Medication Sig Dispense Refill   albuterol (VENTOLIN HFA) 108 (90 Base) MCG/ACT inhaler Inhale 2 puffs into the lungs every 6 (six) hours as needed for wheezing or shortness of breath. 8 g 0   aspirin 81 MG tablet Take 81 mg by mouth daily.     busPIRone (BUSPAR) 30 MG tablet Take 0.5-1 tablets (15-30 mg total) by mouth daily as needed. TAKE (1/2) TO (1) TABLET BY MOUTH ONCE DAILY AS NEEDED 90 tablet 3   Calcium Carbonate-Vitamin D 500-125 MG-UNIT TABS Take 1 capsule by mouth daily.     Cyanocobalamin (VITAMIN B 12 PO) Take 1 tablet by mouth daily.      dimenhyDRINATE (DRAMAMINE) 50 MG tablet  Take 50 mg by mouth every 8 (eight) hours as needed.     donepezil (ARICEPT) 10 MG tablet Take 1 tablet (10 mg total) by mouth at bedtime. 90 tablet 3   ezetimibe (ZETIA) 10 MG tablet TAKE ONE TABLET BY MOUTH DAILY. 90 tablet 0   fluticasone (FLONASE) 50 MCG/ACT nasal spray Place 2 sprays into both nostrils daily. 16 g 6   gabapentin (NEURONTIN) 300 MG capsule Take 1 in the morning, take 2 at night 270 capsule 1   hydrochlorothiazide (MICROZIDE) 12.5 MG capsule TAKE ONE CAPSULE BY MOUTH DAILY. 90 capsule 0   ibuprofen (ADVIL,MOTRIN) 200 MG tablet Take 800 mg by mouth every 6 (six) hours as needed (Pain).      levothyroxine (SYNTHROID) 150 MCG tablet Take 1 tablet (150 mcg total) by mouth daily. 90 tablet 1   lisinopril (ZESTRIL) 2.5 MG tablet TAKE ONE TABLET BY MOUTH DAILY. 90 tablet 0   mirabegron ER (MYRBETRIQ) 25 MG TB24 tablet Take 1  tablet (25 mg total) by mouth daily. 90 tablet 3   Multiple Vitamin (MULTIVITAMIN) capsule Take 1 capsule by mouth daily.     Omega-3 Fatty Acids (FISH OIL) 1000 MG CAPS Take 1,000 mg by mouth daily.     No facility-administered medications prior to visit.    PAST MEDICAL HISTORY: Past Medical History:  Diagnosis Date   Anxiety state, unspecified    Arthritis    Arthropathy, unspecified, site unspecified    Carpal tunnel syndrome    Bilateral   Cataract    Right 7 Left eye remved cateracts   Chronic airway obstruction, not elsewhere classified    patient denies on 02/27/14   Esophageal reflux    Esophageal stricture    Hypertension    Internal hemorrhoids without mention of complication    Malignant neoplasm of breast (female), unspecified site    left breast    Memory disorder 09/24/2013   Moderate obstructive sleep apnea 01/20/2021   Nocturnal leg cramps 06/29/2018   Osteopenia    Other and unspecified hyperlipidemia    Personal history of colonic polyps    adenomatous   Polyneuropathy in other diseases classified elsewhere (HCC) 09/24/2013    Shingles    Sleep apnea    no longer uses cpap   Tubular adenoma of colon    Unspecified hypothyroidism     PAST SURGICAL HISTORY: Past Surgical History:  Procedure Laterality Date   APPENDECTOMY  03/2014   BACK SURGERY     X2   BREAST LUMPECTOMY     COLONOSCOPY  08/03/2018   EYE SURGERY     cataracts bilateral   LAPAROSCOPIC APPENDECTOMY N/A 02/28/2014   Procedure: APPENDECTOMY LAPAROSCOPIC PARTIAL CECECTOMY FOR MUCOCELE OF APPENDIX ;  Surgeon: Karie Soda, MD;  Location: [facility];  Service: General;  Laterality: N/A;   MASTECTOMY     left   POLYPECTOMY     THYROIDECTOMY     TUBAL LIGATION     UPPER GASTROINTESTINAL ENDOSCOPY      FAMILY HISTORY: Family History  Problem Relation Age of Onset   Bipolar disorder Sister    Early death Mother    Early death Sister    Colon cancer Neg Hx    Esophageal cancer Neg Hx    Rectal cancer Neg Hx    Stomach cancer Neg Hx     SOCIAL HISTORY: Social History   Socioeconomic History   Marital status: Divorced    Spouse name: Not on file   Number of children: 2   Years of education: Not on file   Highest education level: Not on file  Occupational History   Occupation: Retired    Associate Professor: RETIRED  Tobacco Use   Smoking status: Never   Smokeless tobacco: Never  Vaping Use   Vaping Use: Never used  Substance and Sexual Activity   Alcohol use: No    Alcohol/week: 0.0 standard drinks of alcohol   Drug use: No   Sexual activity: Never    Birth control/protection: Post-menopausal  Other Topics Concern   Not on file  Social History Narrative   Patient lives at home alone and she is divorced.   Retired.   Education business course.   Right handed.   Caffeine sometimes tea.    Social Determinants of Health   Financial Resource Strain: Low Risk  (12/05/2020)   Overall Financial Resource Strain (CARDIA)    Difficulty of Paying Living Expenses: Not very hard  Food Insecurity: No Food Insecurity (12/05/2020)  Hunger  Vital Sign    Worried About Running Out of Food in the Last Year: Never true    Ran Out of Food in the Last Year: Never true  Transportation Needs: No Transportation Needs (12/05/2020)   PRAPARE - Administrator, Civil Service (Medical): No    Lack of Transportation (Non-Medical): No  Physical Activity: Insufficiently Active (09/21/2017)   Exercise Vital Sign    Days of Exercise per Week: 4 days    Minutes of Exercise per Session: 20 min  Stress: No Stress Concern Present (09/21/2017)   [employer] of Occupational Health - Occupational Stress Questionnaire    Feeling of Stress : Not at all  Social Connections: Moderately Integrated (12/05/2020)   Social Connection and Isolation Panel [NHANES]    Frequency of Communication with Friends and Family: More than three times a week    Frequency of Social Gatherings with Friends and Family: More than three times a week    Attends Religious Services: More than 4 times per year    Active Member of Clubs or Organizations: Yes    Attends Engineer, structural: More than 4 times per year    Marital Status: Divorced  Intimate Partner Violence: Not on file   PHYSICAL EXAM  Vitals:   06/08/22 0823  BP: (!) 172/72  Pulse: 67  Weight: 161 lb (73 kg)  Height: 5\' 9"  (1.753 m)   Body mass index is 23.78 kg/m.    11/18/2021    9:35 AM 05/06/2021    9:06 AM 10/29/2020   11:52 AM  MMSE - Mini Mental State Exam  Orientation to time 5 4 5   Orientation to Place 5 5 5   Registration 3 3 3   Attention/ Calculation 4 5 5   Recall 3 3 3   Language- name 2 objects 2 2 2   Language- repeat 1 1 1   Language- follow 3 step command 3 3 2   Language- read & follow direction 1 1 1   Write a sentence 1 1 1   Copy design 1 1 0  Total score 29 29 28    Generalized: Well developed, in no acute distress, seems to be moving a bit slower than in the past Neurological examination  Mentation: Alert oriented to time, place, history taking. Follows  all commands speech and language fluent Cranial nerve II-XII: Pupils were equal round reactive to light. Extraocular movements were full, visual field were full on confrontational test. Facial sensation and strength were normal.  Head turning and shoulder shrug  were normal and symmetric. Right eye lid mid drooping.  Motor: Good strength overall, except left hip flexion 4/5 Sensory: Soft touch sensation is symmetric to the face, arms, legs. Denied any length dependent sensory deficit to soft touch or pinprick. Coordination: Cerebellar testing reveals good finger-nose-finger and heel-to-shin bilaterally.  Gait and station: Gait is wide based, steady, independent Reflexes: Deep tendon reflexes are symmetric and normal  DIAGNOSTIC DATA (LABS, IMAGING, TESTING) - I reviewed patient records, labs, notes, testing and imaging myself where available.  Lab Results  Component Value Date   WBC 5.1 12/07/2021   HGB 12.2 12/07/2021   HCT 35.7 12/07/2021   MCV 91 12/07/2021   PLT 236 12/07/2021      Component Value Date/Time   NA 142 12/07/2021 1227   NA 144 04/23/2013 1549   K 4.3 12/07/2021 1227   K 3.5 04/23/2013 1549   CL 103 12/07/2021 1227   CO2 26 12/07/2021 1227   CO2 25  04/23/2013 1549   GLUCOSE 84 12/07/2021 1227   GLUCOSE 99 07/02/2019 0823   GLUCOSE 97 04/23/2013 1549   BUN 16 12/07/2021 1227   BUN 20.1 04/23/2013 1549   CREATININE 0.57 12/07/2021 1227   CREATININE 0.7 04/23/2013 1549   CALCIUM 9.1 12/07/2021 1227   CALCIUM 10.3 04/23/2013 1549   PROT 6.2 12/07/2021 1227   PROT 6.8 04/23/2013 1549   ALBUMIN 4.2 12/07/2021 1227   ALBUMIN 4.1 04/23/2013 1549   AST 20 12/07/2021 1227   AST 31 04/23/2013 1549   ALT 14 12/07/2021 1227   ALT 32 04/23/2013 1549   ALKPHOS 50 12/07/2021 1227   ALKPHOS 74 04/23/2013 1549   BILITOT 0.5 12/07/2021 1227   BILITOT 0.53 04/23/2013 1549   GFRNONAA >60 07/02/2019 0815   GFRNONAA >89 07/10/2012 1056   GFRAA >60 07/02/2019 0815   GFRAA  >89 07/10/2012 1056   Lab Results  Component Value Date   CHOL 197 12/07/2021   HDL 55 12/07/2021   LDLCALC 121 (H) 12/07/2021   LDLDIRECT 164 (H) 06/07/2014   TRIG 117 12/07/2021   CHOLHDL 3.6 12/07/2021   Lab Results  Component Value Date   HGBA1C 5.2 12/07/2021   Lab Results  Component Value Date   VITAMINB12 1,831 (H) 06/09/2018   Lab Results  Component Value Date   TSH 6.230 (H) 04/08/2022    Margie Ege, AGNP-C, DNP 06/08/2022, 9:07 AM [facility] [address] [place], [place] [address] [phone]

## 2022-06-09 LAB — C-REACTIVE PROTEIN: CRP: <1 mg/L (ref 0–10)

## 2022-06-09 LAB — SEDIMENTATION RATE: Sed Rate: 2 mm/hr (ref 0–40)

## 2022-07-01 ENCOUNTER — Telehealth: Payer: Self-pay | Admitting: Family Medicine

## 2022-07-01 ENCOUNTER — Other Ambulatory Visit: Payer: Self-pay | Admitting: Family Medicine

## 2022-07-01 DIAGNOSIS — I10 Essential (primary) hypertension: Secondary | ICD-10-CM

## 2022-07-01 NOTE — Telephone Encounter (Signed)
Per lab result note in April, Dr. Dettinger wanted pt to be seen in the clinic in 1-2 months. At that time 61m worth of medication was sent to pts pharmacy. An appt has been scheduled for 7/27 at 10:10. Pt made aware of all.

## 2022-07-01 NOTE — Telephone Encounter (Signed)
Pt called stating that she is out of her thyroid medicine and requested to be scheduled with lab only so that she could have her thyroid rechecked and get refills on her medicine.  Tried explaining to pt that per Dr Oris Drone notes from her last visit in December, he wanted to see her back in 3 months to recheck her thyroid but no appt was ever made for that so she is past due for an appt and would need to schedule an appt with Dr Louanne Skye. Pt basically said that Im wrong and that she doesn't need to see Dr Louanne Skye. Only needs lab appt.   Just need Dr Dettinger to clarify what kind of appt pt needs.

## 2022-07-05 ENCOUNTER — Other Ambulatory Visit: Payer: Self-pay | Admitting: Family Medicine

## 2022-07-05 DIAGNOSIS — E7800 Pure hypercholesterolemia, unspecified: Secondary | ICD-10-CM

## 2022-07-05 DIAGNOSIS — I10 Essential (primary) hypertension: Secondary | ICD-10-CM

## 2022-07-20 ENCOUNTER — Ambulatory Visit
Admission: RE | Admit: 2022-07-20 | Discharge: 2022-07-20 | Disposition: A | Discharge status: Home / Self Care | Payer: Medicare Other | Source: Ambulatory Visit | Attending: Neurology | Admitting: Neurology

## 2022-07-20 DIAGNOSIS — R519 Headache, unspecified: Secondary | ICD-10-CM

## 2022-07-22 ENCOUNTER — Telehealth: Payer: Self-pay | Admitting: Neurology

## 2022-07-22 DIAGNOSIS — R519 Headache, unspecified: Secondary | ICD-10-CM

## 2022-07-22 NOTE — Telephone Encounter (Signed)
I tried to call the patient to review MRI, no answer, I left a message, please try to reach her, shows superficial siderosis to left frontal left parietal regions.  Unclear if old bleeding, progression of dementia, amyloid angiopathy? No other recent MRI to compare, CT did not give good picture for comparison.  Is not on the side where she complains of headache.  Would recommend she check in with [place] routinely over the next month.  We could consider repeating an MRI in 1 month for reevaluation.  Lets get her in for new problem visit with Dr. Terrace Arabia for right-sided headache, worsening gait as new consult.  Will send myself reminder in 1 month to call and check on her.  Have her call us sooner if needed.  I reviewed MRI findings with Dr. Marjory Lies.  MRI brain (without) demonstrating: -Superficial siderosis over the left frontal and left parietal regions may be related to hemosiderin deposition, chronic subarachnoid blood products or amyloid angiopathy.  - Mild periventricular, subcortical and left parietal cortical foci of chronic small vessel ischemic disease.  - No acute findings.

## 2022-07-22 NOTE — Telephone Encounter (Signed)
Pt called back. Requesting a call back from nurse.  

## 2022-07-22 NOTE — Telephone Encounter (Signed)
Called patient back, She states she fell in the spring, she did not know month or day but hit the left side of her head and the EMS came and she got it taped but did not go with them to the ER for evaluation despite being encouraged to do so by the paramedics. She has since had headaches on right side of head but states it does alter back and forth between them. I advised ER if sudden onset of symptoms occur and she agrees.

## 2022-07-22 NOTE — Telephone Encounter (Signed)
Called patient and left detailed message per DPR, but will still try to reach again

## 2022-07-30 ENCOUNTER — Ambulatory Visit: Payer: Medicare Other | Admitting: Family Medicine

## 2022-08-04 ENCOUNTER — Telehealth: Payer: Self-pay | Admitting: Family Medicine

## 2022-08-04 DIAGNOSIS — M129 Arthropathy, unspecified: Secondary | ICD-10-CM

## 2022-08-04 NOTE — Telephone Encounter (Signed)
REFERRAL REQUEST Telephone Note  Have you been seen at our office for this problem? yes (Advise that they may need an appointment with their PCP before a referral can be done)  Reason for Referral: knee and hip pain Referral discussed with patient: yes pt states this is an ongoing thing that she has discussed with Dr. Algis Downs. But it has gotten worse wants to see an ortho  Best contact number of patient for referral team: [phone]    Has patient been seen by a specialist for this issue before: yes once a long time ago does not remember who it was  thinks it may have been Dr. Despina Hick? Patient provider preference for referral: no Patient location preference for referral: [place]   Patient notified that referrals can take up to a week or longer to process. If they haven't heard anything within a week they should call back and speak with the referral department.

## 2022-08-06 NOTE — Telephone Encounter (Signed)
Pt made aware

## 2022-08-06 NOTE — Telephone Encounter (Signed)
Please Review

## 2022-08-06 NOTE — Telephone Encounter (Signed)
Placed referral for the patient. 

## 2022-08-10 DIAGNOSIS — M25561 Pain in right knee: Secondary | ICD-10-CM | POA: Diagnosis not present

## 2022-08-10 DIAGNOSIS — M545 Low back pain, unspecified: Secondary | ICD-10-CM | POA: Insufficient documentation

## 2022-08-12 NOTE — Addendum Note (Signed)
Addended by: Glean Salvo on: 08/12/2022 12:54 PM   Modules accepted: Orders

## 2022-08-12 NOTE — Telephone Encounter (Signed)
I called the patient, will order MRI of the brain for reevaluation 07/20/22 MRI brain.  Continues with right sided headache, travels frontal.  Continues with some gait instability.  Did go see orthopedics, reports was placed on prednisone.  No major changes.  Orders Placed This Encounter  Procedures   MR BRAIN WO CONTRAST   IMPRESSION: 07/20/22   MRI brain (without) demonstrating: -Superficial siderosis over the left frontal and left parietal regions may be related to hemosiderin deposition, chronic subarachnoid blood products or amyloid angiopathy.  - Mild periventricular, subcortical and left parietal cortical foci of chronic small vessel ischemic disease.  - No acute findings.

## 2022-08-16 ENCOUNTER — Telehealth: Payer: Self-pay | Admitting: Neurology

## 2022-08-16 NOTE — Telephone Encounter (Signed)
[employer] medicare NPR sent to GI [phone] 

## 2022-08-16 NOTE — Addendum Note (Signed)
Addended by: Glean Salvo on: 08/16/2022 07:47 AM   Modules accepted: Orders

## 2022-08-16 NOTE — Telephone Encounter (Signed)
Added MRA head.  Orders Placed This Encounter  Procedures   MR BRAIN WO CONTRAST   MR ANGIO HEAD WO CONTRAST

## 2022-08-17 DIAGNOSIS — H5711 Ocular pain, right eye: Secondary | ICD-10-CM | POA: Diagnosis not present

## 2022-08-17 DIAGNOSIS — H43813 Vitreous degeneration, bilateral: Secondary | ICD-10-CM | POA: Diagnosis not present

## 2022-08-17 DIAGNOSIS — H35373 Puckering of macula, bilateral: Secondary | ICD-10-CM | POA: Diagnosis not present

## 2022-08-30 ENCOUNTER — Ambulatory Visit (INDEPENDENT_AMBULATORY_CARE_PROVIDER_SITE_OTHER): Payer: Medicare Other

## 2022-08-30 ENCOUNTER — Ambulatory Visit: Payer: Medicare Other | Admitting: Family Medicine

## 2022-08-30 VITALS — BP 120/66 | HR 57 | Ht 69.0 in | Wt 155.0 lb

## 2022-08-30 DIAGNOSIS — Z780 Asymptomatic menopausal state: Secondary | ICD-10-CM

## 2022-08-30 DIAGNOSIS — I10 Essential (primary) hypertension: Secondary | ICD-10-CM

## 2022-08-30 DIAGNOSIS — E890 Postprocedural hypothyroidism: Secondary | ICD-10-CM | POA: Diagnosis not present

## 2022-08-30 DIAGNOSIS — E7800 Pure hypercholesterolemia, unspecified: Secondary | ICD-10-CM

## 2022-08-30 DIAGNOSIS — Z23 Encounter for immunization: Secondary | ICD-10-CM

## 2022-08-30 LAB — CBC WITH DIFFERENTIAL/PLATELET
Basophils Absolute: 0 10*3/uL (ref 0.0–0.2)
Basos: 1 %
EOS (ABSOLUTE): 0.2 10*3/uL (ref 0.0–0.4)
Eos: 4 %
Hematocrit: 39.5 % (ref 34.0–46.6)
Hemoglobin: 12.7 g/dL (ref 11.1–15.9)
Immature Grans (Abs): 0 10*3/uL (ref 0.0–0.1)
Immature Granulocytes: 0 %
Lymphocytes Absolute: 1.5 10*3/uL (ref 0.7–3.1)
Lymphs: 26 %
MCH: 29.5 pg (ref 26.6–33.0)
MCHC: 32.2 g/dL (ref 31.5–35.7)
MCV: 92 fL (ref 79–97)
Monocytes Absolute: 0.5 10*3/uL (ref 0.1–0.9)
Monocytes: 9 %
Neutrophils Absolute: 3.4 10*3/uL (ref 1.4–7.0)
Neutrophils: 60 %
Platelets: 190 10*3/uL (ref 150–450)
RBC: 4.31 x10E6/uL (ref 3.77–5.28)
RDW: 13.6 % (ref 11.7–15.4)
WBC: 5.6 10*3/uL (ref 3.4–10.8)

## 2022-08-30 LAB — CMP14+EGFR
ALT: 15 IU/L (ref 0–32)
AST: 18 IU/L (ref 0–40)
Albumin: 4.1 g/dL (ref 3.7–4.7)
Alkaline Phosphatase: 52 IU/L (ref 44–121)
BUN/Creatinine Ratio: 24 (ref 12–28)
BUN: 15 mg/dL (ref 8–27)
Bilirubin Total: 0.5 mg/dL (ref 0.0–1.2)
CO2: 27 mmol/L (ref 20–29)
Calcium: 9.3 mg/dL (ref 8.7–10.3)
Chloride: 102 mmol/L (ref 96–106)
Creatinine, Ser: 0.62 mg/dL (ref 0.57–1.00)
Globulin, Total: 2 g/dL (ref 1.5–4.5)
Glucose: 90 mg/dL (ref 70–99)
Potassium: 4.1 mmol/L (ref 3.5–5.2)
Sodium: 140 mmol/L (ref 134–144)
Total Protein: 6.1 g/dL (ref 6.0–8.5)
eGFR: 89 mL/min/{1.73_m2} (ref >59)

## 2022-08-30 LAB — LIPID PANEL
Chol/HDL Ratio: 3.7 ratio (ref 0.0–4.4)
Cholesterol, Total: 232 mg/dL — ABNORMAL HIGH (ref 100–199)
HDL: 63 mg/dL (ref >39)
LDL Chol Calc (NIH): 149 mg/dL — ABNORMAL HIGH (ref 0–99)
Triglycerides: 112 mg/dL (ref 0–149)
VLDL Cholesterol Cal: 20 mg/dL (ref 5–40)

## 2022-08-30 LAB — TSH: TSH: 3.41 u[IU]/mL (ref 0.450–4.500)

## 2022-08-30 MED ORDER — LISINOPRIL 2.5 MG PO TABS
2.5000 mg | ORAL_TABLET | Freq: Every day | ORAL | 3 refills | Status: AC
Start: 2022-08-30 — End: ?

## 2022-08-30 MED ORDER — EZETIMIBE 10 MG PO TABS: 10.0000 mg | ORAL_TABLET | Freq: Every day | ORAL | 3 refills | Status: DC

## 2022-08-30 MED ORDER — LEVOTHYROXINE SODIUM 150 MCG PO TABS
150.0000 ug | ORAL_TABLET | Freq: Every day | ORAL | 3 refills | Status: DC
Start: 2022-08-30 — End: 2023-03-10

## 2022-08-30 NOTE — Addendum Note (Signed)
Addended by: Leda Min D on: 08/30/2022 08:52 AM   Modules accepted: Orders

## 2022-08-30 NOTE — Progress Notes (Signed)
BP 120/66   Pulse (!) 57   Ht 5\' 9"  (1.753 m)   Wt 155 lb (70.3 kg)   LMP  (LMP Unknown)   SpO2 99%   BMI 22.89 kg/m    Subjective:   Patient ID: [name], female    DOB: 08/12/1940, 81 y.o.   MRN: [id number]  HPI: [name] is a 82 y.o. female presenting on 08/30/2022 for Medical Management of Chronic Issues, Hypothyroidism, and Hypertension   HPI Hypertension Patient is currently on lisinopril and hydrochlorothiazide, and their blood pressure today is 120/66.  She has had a couple episodes of lightheadedness and dizziness and 1 time where she feels like she is semipassed out and was weak for a little bit on the floor.. Patient denies headaches, blurred vision, chest pains, shortness of breath, or weakness. Denies any side effects from medication and is content with current medication.   Hypothyroidism recheck Patient is coming in for thyroid recheck today as well. They deny any issues with hair changes or heat or cold problems or diarrhea or constipation. They deny any chest pain or palpitations. They are currently on levothyroxine 150 micrograms   Hyperlipidemia Patient is coming in for recheck of his hyperlipidemia. The patient is currently taking fish oils and Zetia. They deny any issues with myalgias or history of liver damage from it. They deny any focal numbness or weakness or chest pain.   Relevant past medical, surgical, family and social history reviewed and updated as indicated. Interim medical history since our last visit reviewed. Allergies and medications reviewed and updated.  Review of Systems  Constitutional:  Negative for chills and fever.  HENT:  Negative for congestion, ear discharge and ear pain.   Eyes:  Negative for redness and visual disturbance.  Respiratory:  Negative for chest tightness and shortness of breath.   Cardiovascular:  Negative for chest pain and leg swelling.  Genitourinary:  Positive for frequency. Negative for difficulty urinating  and dysuria.  Musculoskeletal:  Negative for back pain and gait problem.  Skin:  Negative for rash.  Neurological:  Positive for dizziness and light-headedness. Negative for headaches.  Psychiatric/Behavioral:  Negative for agitation and behavioral problems.   All other systems reviewed and are negative.   Per HPI unless specifically indicated above   Allergies as of 08/30/2022       Reactions   Atorvastatin Other (See Comments)   Muscle cramps   Meperidine Hcl Nausea And Vomiting   Makes me 'deathly' sick   Statins Other (See Comments)   REACTION: Patient cannot tolerate due cramps   Norco [hydrocodone-acetaminophen] Nausea And Vomiting   Sulfonamide Derivatives Other (See Comments)   Unknown reaction        Medication List        Accurate as of August 30, 2022  8:44 AM. If you have any questions, ask your nurse or doctor.          STOP taking these medications    hydrochlorothiazide 12.5 MG capsule Commonly known as: MICROZIDE Stopped by: Elige Radon Sharron Petruska       TAKE these medications    albuterol 108 (90 Base) MCG/ACT inhaler Commonly known as: VENTOLIN HFA Inhale 2 puffs into the lungs every 6 (six) hours as needed for wheezing or shortness of breath.   aspirin 81 MG tablet Take 81 mg by mouth daily.   busPIRone 30 MG tablet Commonly known as: BUSPAR Take 0.5-1 tablets (15-30 mg total) by mouth daily as needed. TAKE (1/2)  TO (1) TABLET BY MOUTH ONCE DAILY AS NEEDED   Calcium Carbonate-Vitamin D 500-125 MG-UNIT Tabs Take 1 capsule by mouth daily.   dimenhyDRINATE 50 MG tablet Commonly known as: DRAMAMINE Take 50 mg by mouth every 8 (eight) hours as needed.   donepezil 10 MG tablet Commonly known as: ARICEPT Take 1 tablet (10 mg total) by mouth at bedtime.   ezetimibe 10 MG tablet Commonly known as: ZETIA Take 1 tablet (10 mg total) by mouth daily.   Fish Oil 1000 MG Caps Take 1,000 mg by mouth daily.   fluticasone 50 MCG/ACT nasal  spray Commonly known as: FLONASE Place 2 sprays into both nostrils daily.   gabapentin 300 MG capsule Commonly known as: NEURONTIN Take 1 in the morning, take 2 at night   ibuprofen 200 MG tablet Commonly known as: ADVIL Take 800 mg by mouth every 6 (six) hours as needed (Pain).   levothyroxine 150 MCG tablet Commonly known as: SYNTHROID Take 1 tablet (150 mcg total) by mouth daily.   lisinopril 2.5 MG tablet Commonly known as: ZESTRIL Take 1 tablet (2.5 mg total) by mouth daily.   mirabegron ER 25 MG Tb24 tablet Commonly known as: Myrbetriq Take 1 tablet (25 mg total) by mouth daily.   multivitamin capsule Take 1 capsule by mouth daily.   VITAMIN B 12 PO Take 1 tablet by mouth daily.         Objective:   BP 120/66   Pulse (!) 57   Ht 5\' 9"  (1.753 m)   Wt 155 lb (70.3 kg)   LMP  (LMP Unknown)   SpO2 99%   BMI 22.89 kg/m   Wt Readings from Last 3 Encounters:  08/30/22 155 lb (70.3 kg)  06/08/22 161 lb (73 kg)  02/17/22 162 lb (73.5 kg)    Physical Exam Vitals and nursing note reviewed.  Constitutional:      General: She is not in acute distress.    Appearance: She is well-developed. She is not diaphoretic.  Eyes:     Conjunctiva/sclera: Conjunctivae normal.  Cardiovascular:     Rate and Rhythm: Normal rate and regular rhythm.     Heart sounds: Normal heart sounds. No murmur heard. Pulmonary:     Effort: Pulmonary effort is normal. No respiratory distress.     Breath sounds: Normal breath sounds. No wheezing.  Musculoskeletal:        General: No swelling or tenderness. Normal range of motion.  Skin:    General: Skin is warm and dry.     Findings: No rash.  Neurological:     Mental Status: She is alert and oriented to person, place, and time.     Coordination: Coordination normal.  Psychiatric:        Behavior: Behavior normal.       Assessment & Plan:   Problem List Items Addressed This Visit       Cardiovascular and Mediastinum   HTN  (hypertension)   Relevant Medications   ezetimibe (ZETIA) 10 MG tablet   lisinopril (ZESTRIL) 2.5 MG tablet   Other Relevant Orders   CMP14+EGFR   CMP14+EGFR     Endocrine   Hypothyroidism, postsurgical - Primary   Relevant Medications   levothyroxine (SYNTHROID) 150 MCG tablet   Other Relevant Orders   CBC with Differential/Platelet   TSH     Other   Hyperlipidemia   Relevant Medications   ezetimibe (ZETIA) 10 MG tablet   lisinopril (ZESTRIL) 2.5 MG tablet  Other Relevant Orders   Lipid panel    She will stop the hydrochlorothiazide because of lightheadedness and dizziness.  She does not feel like the Myrbetriq helping her all and she feels like it might be making her brain more foggy so she will stop that for 2 weeks to see if it was helping at all and to see if side effects improved. Follow up plan: Return in about 6 months (around 03/02/2023), or if symptoms worsen or fail to improve, for Thyroid and cholesterol recheck.  Counseling provided for all of the vaccine components Orders Placed This Encounter  Procedures   CBC with Differential/Platelet   CMP14+EGFR   Lipid panel   TSH    Arville Care, MD [facility] 08/30/2022, 8:44 AM

## 2022-08-30 NOTE — Addendum Note (Signed)
Addended by: Dorene Sorrow on: 08/30/2022 09:16 AM   Modules accepted: Orders

## 2022-08-30 NOTE — Patient Instructions (Addendum)
Stop taking hydrochlorothiazide  May also try stopping Myrbetriq for 2 weeks to see if it makes any difference with her bladder or not.

## 2022-08-31 DIAGNOSIS — Z780 Asymptomatic menopausal state: Secondary | ICD-10-CM | POA: Diagnosis not present

## 2022-08-31 DIAGNOSIS — M85832 Other specified disorders of bone density and structure, left forearm: Secondary | ICD-10-CM | POA: Diagnosis not present

## 2022-08-31 DIAGNOSIS — M85852 Other specified disorders of bone density and structure, left thigh: Secondary | ICD-10-CM | POA: Diagnosis not present

## 2022-09-10 ENCOUNTER — Ambulatory Visit: Payer: Medicare Other | Admitting: Family

## 2022-09-10 DIAGNOSIS — R109 Unspecified abdominal pain: Secondary | ICD-10-CM | POA: Diagnosis not present

## 2022-09-10 DIAGNOSIS — N3001 Acute cystitis with hematuria: Secondary | ICD-10-CM | POA: Diagnosis not present

## 2022-09-10 DIAGNOSIS — E039 Hypothyroidism, unspecified: Secondary | ICD-10-CM | POA: Diagnosis not present

## 2022-09-10 DIAGNOSIS — I700 Atherosclerosis of aorta: Secondary | ICD-10-CM | POA: Diagnosis not present

## 2022-09-10 DIAGNOSIS — R059 Cough, unspecified: Secondary | ICD-10-CM | POA: Diagnosis not present

## 2022-09-10 DIAGNOSIS — U099 Post covid-19 condition, unspecified: Secondary | ICD-10-CM | POA: Diagnosis not present

## 2022-09-10 DIAGNOSIS — R079 Chest pain, unspecified: Secondary | ICD-10-CM | POA: Diagnosis not present

## 2022-09-10 DIAGNOSIS — R300 Dysuria: Secondary | ICD-10-CM | POA: Diagnosis not present

## 2022-09-10 DIAGNOSIS — R350 Frequency of micturition: Secondary | ICD-10-CM | POA: Diagnosis not present

## 2022-09-10 DIAGNOSIS — R0789 Other chest pain: Secondary | ICD-10-CM | POA: Diagnosis not present

## 2022-09-10 DIAGNOSIS — J208 Acute bronchitis due to other specified organisms: Secondary | ICD-10-CM | POA: Diagnosis not present

## 2022-09-20 ENCOUNTER — Ambulatory Visit
Admission: RE | Admit: 2022-09-20 | Discharge: 2022-09-20 | Disposition: A | Discharge status: Home / Self Care | Payer: Medicare Other | Source: Ambulatory Visit | Attending: Neurology | Admitting: Neurology

## 2022-09-20 DIAGNOSIS — R519 Headache, unspecified: Secondary | ICD-10-CM

## 2022-09-23 ENCOUNTER — Telehealth: Payer: Self-pay | Admitting: Neurology

## 2022-09-23 NOTE — Telephone Encounter (Signed)
I called the patient.  Repeat MRI of the brain showed stable superficial siderosis over the left frontal left parietal regions.  No significant change from July 2024.  MRI of the head was normal.  She continues with headache, could be right or left-sided.  Continues with gait instability.  She did see Dr. Dione Booze, no eye abnormality.  ESR, CRP were normal in June 2024 when I saw her.  Currently scheduled to see Dr. Terrace Arabia October 12, 2022 as new consult.  Will check to see if any sooner appointments have opened?  IMPRESSION:    MRI brain without contrast demonstrating: -Stable superficial siderosis over the left frontal left parietal regions. -Mild chronic small vessel ischemic disease. -No acute findings. No significant change from 07/20/22.   IMPRESSION:    Normal MRA head (without).    My office note June 08, 2022  1.  Gait disturbance 2.  Peripheral neuropathy 3.  Mild cognitive impairment 4.  Worsening gait, balance, new right sided headache, with blurry vision to right eye   -Check MRI brain to rule out acute change since her fall 2 months ago in the setting of daily headache right sided.  Reported worsening gait -Check ESR, CRP to rule out temporal arteritis given vision change -Referral to Dr. Dione Booze for follow-up of vision issues -She will remain on gabapentin 300/600, we discussed reducing to 300 mg twice daily see if any changes to her gait, she does not think an issue, will continue Aricept 10 mg daily, can readdress memory in the future, I see no evidence of any memory change -Have recommended nightly CPAP use, potentially some contribution to morning headache without using -She has known peripheral neuropathy, continues with her exercise classes, lives independent, mows and maintains her long -BP is up, needs to follow-up with PCP, keep a monitor at home -Follow-up in 4 months or sooner if needed with Dr. Terrace Arabia

## 2022-10-08 ENCOUNTER — Other Ambulatory Visit: Payer: Self-pay | Admitting: Family Medicine

## 2022-10-08 DIAGNOSIS — I10 Essential (primary) hypertension: Secondary | ICD-10-CM

## 2022-10-12 ENCOUNTER — Ambulatory Visit: Payer: Medicare Other | Admitting: Neurology

## 2022-10-12 ENCOUNTER — Ambulatory Visit
Admission: RE | Admit: 2022-10-12 | Discharge: 2022-10-12 | Disposition: A | Discharge status: Home / Self Care | Payer: Medicare Other | Source: Ambulatory Visit | Attending: Neurology | Admitting: Neurology

## 2022-10-12 VITALS — BP 175/68 | HR 71 | Ht 69.0 in | Wt 157.0 lb

## 2022-10-12 DIAGNOSIS — M5442 Lumbago with sciatica, left side: Secondary | ICD-10-CM

## 2022-10-12 DIAGNOSIS — G4452 New daily persistent headache (NDPH): Secondary | ICD-10-CM

## 2022-10-12 DIAGNOSIS — R269 Unspecified abnormalities of gait and mobility: Secondary | ICD-10-CM | POA: Diagnosis not present

## 2022-10-12 DIAGNOSIS — R4020 Unspecified coma: Secondary | ICD-10-CM

## 2022-10-12 DIAGNOSIS — R413 Other amnesia: Secondary | ICD-10-CM

## 2022-10-12 DIAGNOSIS — M545 Low back pain, unspecified: Secondary | ICD-10-CM | POA: Diagnosis not present

## 2022-10-12 DIAGNOSIS — M25552 Pain in left hip: Secondary | ICD-10-CM | POA: Diagnosis not present

## 2022-10-12 MED ORDER — LAMOTRIGINE 25 MG PO TABS: 50.0000 mg | ORAL_TABLET | Freq: Two times a day (BID) | ORAL | 5 refills | Status: DC

## 2022-10-12 MED ORDER — BUTALBITAL-APAP-CAFFEINE 50-325-40 MG PO TABS: ORAL_TABLET | ORAL | 5 refills | Status: AC

## 2022-10-12 NOTE — Progress Notes (Signed)
ASSESSMENT AND PLAN 82 y.o. year old female      Chronic Headaches Daily headaches, predominantly frontal, with severity up to 10/10. Overuse of Tylenol, up to 6 doses daily. -Discontinue daily Tylenol use due to risk of liver damage. -Prescribe daily medication to prevent headaches lamotrigine 25 mg titrating to 50 mg twice a day  Memory Loss Reported memory loss for approximately 4 years.  MRI shows brain atrophy, superficial cytosis, old stroke,   Gait Instability and Falls Increased difficulty with walking and balance over the past year, with several falls resulting in head injury. -Consider referral for physical therapy, although patient declined.  Urinary Incontinence, low back pain,  severe, requiring multiple daily changes of clothing. X-ray of spine looking for degenerative changes that might contributed to her complaints  Sleep Apnea Non-compliant with CPAP use, resulting in excessive daytime sleepiness and falls asleep on toilet. -Encourage compliance with CPAP use.   DIAGNOSTIC DATA (LABS, IMAGING, TESTING) - I reviewed patient records, labs, notes, testing and imaging myself where available.    HISTORY OF PRESENT ILLNESS: Discussed the use of AI scribe software for clinical note transcription with the patient, who gave verbal consent to proceed.  The patient, with a past medical history of breast cancer, thyroid issues, and hypertension, presents with multiple complaints. The chief complaint is memory loss, which she has been experiencing for about three to four years. She reports no family history of memory loss. The patient also reports frequent falls, which have increased in the past year. She has hit her head several times during these falls.  The patient also complains of daily headaches, which she describes as a pain above her eyes. The pain can range from a 6 to a 10 on a scale of 0 to 10. She has been taking over-the-counter Tylenol to manage the pain,  often exceeding the recommended dosage.  She also reports leg and back pain, which she describes as terrible. The pain in her legs has been affecting her walking and balance. She also reports bladder control issues, which have become desperate. She has to change her clothes four to five times a day due to urinary incontinence.  The patient lives alone and is quite active. She works in the yard and attends exercise classes at her church twice a week. However, her physical condition has been deteriorating, affecting her daily activities. She reports a decrease in hand strength, which has affected her ability to do tasks like rolling her hair or opening a can.    PHYSICAL EXAM  Vitals:   10/12/22 0914 10/12/22 0921  BP: (!) 170/68 (!) 175/68  Pulse: 78 71  Weight: 157 lb (71.2 kg)   Height: 5\' 9"  (1.753 m)    Body mass index is 23.18 kg/m.    10/12/2022    9:19 AM 11/18/2021    9:35 AM 05/06/2021    9:06 AM  MMSE - Mini Mental State Exam  Orientation to time 4 5 4   Orientation to Place 5 5 5   Registration 3 3 3   Attention/ Calculation 4 4 5   Recall 3 3 3   Language- name 2 objects 2 2 2   Language- repeat 1 1 1   Language- follow 3 step command 3 3 3   Language- read & follow direction 1 1 1   Write a sentence 1 1 1   Copy design 1 1 1   Total score 28 29 29    Generalized: Elderly female, awake, alert oriented to history taking and casual  conversation conversation Neurological examination  Mentation: Alert oriented to time, place, history taking. Follows all commands speech and language fluent Cranial nerve II-XII: Pupils were equal round reactive to light. Extraocular movements were full, visual field were full on confrontational test. Facial sensation and strength were normal.  Head turning and shoulder shrug  were normal and symmetric. Right eye lid mid drooping.  Motor: No significant limb muscle weakness, Sensory: Intact to light touch bilaterally Coordination: No truncal ataxia or  limb dysmetria noted, Gait and station: Push-up to get up from seated position, cautious, Reflexes: Deep tendon reflexes are present and symmetric      REVIEW OF SYSTEMS: Out of a complete 14 system review of symptoms, the patient complains only of the following symptoms, and all other reviewed systems are negative.    ALLERGIES: Allergies  Allergen Reactions   Atorvastatin Other (See Comments)    Muscle cramps   Meperidine Hcl Nausea And Vomiting    Makes me 'deathly' sick   Statins Other (See Comments)    REACTION: Patient cannot tolerate due cramps   Norco [Hydrocodone-Acetaminophen] Nausea And Vomiting   Sulfonamide Derivatives Other (See Comments)    Unknown reaction    HOME MEDICATIONS: Outpatient Medications Prior to Visit  Medication Sig Dispense Refill   albuterol (VENTOLIN HFA) 108 (90 Base) MCG/ACT inhaler Inhale 2 puffs into the lungs every 6 (six) hours as needed for wheezing or shortness of breath. 8 g 0   aspirin 81 MG tablet Take 81 mg by mouth daily.     busPIRone (BUSPAR) 30 MG tablet Take 0.5-1 tablets (15-30 mg total) by mouth daily as needed. TAKE (1/2) TO (1) TABLET BY MOUTH ONCE DAILY AS NEEDED 90 tablet 3   Calcium Carbonate-Vitamin D 500-125 MG-UNIT TABS Take 1 capsule by mouth daily.     Cyanocobalamin (VITAMIN B 12 PO) Take 1 tablet by mouth daily.      dimenhyDRINATE (DRAMAMINE) 50 MG tablet Take 50 mg by mouth every 8 (eight) hours as needed.     donepezil (ARICEPT) 10 MG tablet Take 1 tablet (10 mg total) by mouth at bedtime. 90 tablet 3   ezetimibe (ZETIA) 10 MG tablet Take 1 tablet (10 mg total) by mouth daily. 90 tablet 3   fluticasone (FLONASE) 50 MCG/ACT nasal spray Place 2 sprays into both nostrils daily. 16 g 6   gabapentin (NEURONTIN) 300 MG capsule Take 1 in the morning, take 2 at night 270 capsule 1   ibuprofen (ADVIL,MOTRIN) 200 MG tablet Take 800 mg by mouth every 6 (six) hours as needed (Pain).      levothyroxine (SYNTHROID) 150 MCG tablet  Take 1 tablet (150 mcg total) by mouth daily. 90 tablet 3   lisinopril (ZESTRIL) 2.5 MG tablet Take 1 tablet (2.5 mg total) by mouth daily. 90 tablet 3   mirabegron ER (MYRBETRIQ) 25 MG TB24 tablet Take 1 tablet (25 mg total) by mouth daily. 90 tablet 3   Multiple Vitamin (MULTIVITAMIN) capsule Take 1 capsule by mouth daily.     Omega-3 Fatty Acids (FISH OIL) 1000 MG CAPS Take 1,000 mg by mouth daily.     No facility-administered medications prior to visit.    PAST MEDICAL HISTORY: Past Medical History:  Diagnosis Date   Anxiety state, unspecified    Arthritis    Arthropathy, unspecified, site unspecified    Carpal tunnel syndrome    Bilateral   Cataract    Right 7 Left eye remved cateracts   Chronic airway obstruction,  not elsewhere classified    patient denies on 02/27/14   Esophageal reflux    Esophageal stricture    Hypertension    Internal hemorrhoids without mention of complication    Malignant neoplasm of breast (female), unspecified site    left breast    Memory disorder 09/24/2013   Moderate obstructive sleep apnea 01/20/2021   Nocturnal leg cramps 06/29/2018   Osteopenia    Other and unspecified hyperlipidemia    Personal history of colonic polyps    adenomatous   Polyneuropathy in other diseases classified elsewhere (HCC) 09/24/2013   Shingles    Sleep apnea    no longer uses cpap   Tubular adenoma of colon    Unspecified hypothyroidism     PAST SURGICAL HISTORY: Past Surgical History:  Procedure Laterality Date   APPENDECTOMY  03/2014   BACK SURGERY     X2   BREAST LUMPECTOMY     COLONOSCOPY  08/03/2018   EYE SURGERY     cataracts bilateral   LAPAROSCOPIC APPENDECTOMY N/A 02/28/2014   Procedure: APPENDECTOMY LAPAROSCOPIC PARTIAL CECECTOMY FOR MUCOCELE OF APPENDIX ;  Surgeon: Karie Soda, MD;  Location: [facility];  Service: General;  Laterality: N/A;   MASTECTOMY     left   POLYPECTOMY     THYROIDECTOMY     TUBAL LIGATION     UPPER GASTROINTESTINAL  ENDOSCOPY      FAMILY HISTORY: Family History  Problem Relation Age of Onset   Bipolar disorder Sister    Early death Mother    Early death Sister    Colon cancer Neg Hx    Esophageal cancer Neg Hx    Rectal cancer Neg Hx    Stomach cancer Neg Hx     SOCIAL HISTORY: Social History   Socioeconomic History   Marital status: Divorced    Spouse name: Not on file   Number of children: 2   Years of education: Not on file   Highest education level: Not on file  Occupational History   Occupation: Retired    Associate Professor: RETIRED  Tobacco Use   Smoking status: Never   Smokeless tobacco: Never  Vaping Use   Vaping status: Never Used  Substance and Sexual Activity   Alcohol use: No    Alcohol/week: 0.0 standard drinks of alcohol   Drug use: No   Sexual activity: Never    Birth control/protection: Post-menopausal  Other Topics Concern   Not on file  Social History Narrative   Patient lives at home alone and she is divorced.   Retired.   Education business course.   Right handed.   Caffeine sometimes tea.    Social Determinants of Health   Financial Resource Strain: Low Risk  (12/05/2020)   Overall Financial Resource Strain (CARDIA)    Difficulty of Paying Living Expenses: Not very hard  Food Insecurity: No Food Insecurity (07/02/2021)   Received from [facility], [facility]   Hunger Vital Sign    Worried About Running Out of Food in the Last Year: Never true    Ran Out of Food in the Last Year: Never true  Transportation Needs: No Transportation Needs (12/05/2020)   PRAPARE - Administrator, Civil Service (Medical): No    Lack of Transportation (Non-Medical): No  Physical Activity: Insufficiently Active (09/21/2017)   Exercise Vital Sign    Days of Exercise per Week: 4 days    Minutes of Exercise per Session: 20 min  Stress: No Stress Concern Present (  09/21/2017)   [employer] [employer] - Occupational Stress Questionnaire    Feeling of  Stress : Not at all  Social Connections: Unknown (05/15/2021)   Received from [facility], [facility]   Social Network    Social Network: Not on file  Intimate Partner Violence: Unknown (04/07/2021)   Received from [facility], [facility]   HITS    Physically Hurt: Not on file    Insult or Talk Down To: Not on file    Threaten Physical Harm: Not on file    Scream or Curse: Not on file    Levert Feinstein, M.D. Ph.D.  [facility] [address] [place], [place] [address] Phone: [phone] Fax:      [phone]

## 2022-10-13 ENCOUNTER — Telehealth: Payer: Self-pay | Admitting: Family Medicine

## 2022-10-13 DIAGNOSIS — N3946 Mixed incontinence: Secondary | ICD-10-CM

## 2022-10-13 DIAGNOSIS — R351 Nocturia: Secondary | ICD-10-CM

## 2022-10-13 NOTE — Telephone Encounter (Signed)
REFERRAL REQUEST Telephone Note  Have you been seen at our office for this problem? YES (Advise that they may need an appointment with their PCP before a referral can be done)  Reason for Referral: Constantly wetting in pants day and night Referral discussed with patient: yes  Best contact number of patient for referral team: [phone]    Has patient been seen by a specialist for this issue before: YES  Patient provider preference for referral: N/A Patient location preference for referral- [place]   Patient notified that referrals can take up to a week or longer to process. If they haven't heard anything within a week they should call back and speak with the referral department.

## 2022-10-21 NOTE — Telephone Encounter (Signed)
Placed referral

## 2022-11-08 NOTE — Progress Notes (Unsigned)
Name: [name] DOB: 06/24/1940 MRN: [id number]  History of Present Illness: [name] is a 82 y.o. female who presents today as a new patient at [facility] [place]. All available relevant medical records have been reviewed.  - GU History: 1. OAB with urinary urgency. Previously seen by Dr. Marlou Porch at [facility] in April 2022.  Today: She reports chief complaint of ***  She {Actions; denies-reports:120008} urge incontinence. She {Actions; denies-reports:120008} stress incontinence with ***cough/***laugh/***sneeze/***heavy lifting /***exercise. She reports the ***SUI / ***UUI is predominant.  She leaks *** times per ***. Wears *** ***pads/ ***diapers per day. She reports urinary incontinence {ACTION; IS/IS [id number]} significantly bothersome.   She reports urinary ***frequency, ***nocturia, and ***urgency. Voiding ***x/day and ***x/night on average.   She {Actions; denies-reports:120008} prior attempted treatment for these symptoms ***including ***  She {Actions; denies-reports:120008} caffeine intake (*** caffeinated beverages per day on average).  She has obstructive sleep apnea and per PCP notes has been non-compliant with CPAP use. She {Actions; denies-reports:120008} fluid intake within 3 hours prior to bedtime. She {Actions; denies-reports:120008} fluid intake during the night.  She {Actions; denies-reports:120008} caffeine intake within 8 hours prior to bedtime. She {Actions; denies-reports:120008} routinely experiencing lower extremity edema during the day. ***She {Actions; denies-reports:120008} elevating feet during the day and/or wearing compression socks when lower extremity edema is present during the day. ***She {Actions; denies-reports:120008} monitoring dietary salt and sodium intake.  She {Actions; denies-reports:120008} dysuria, gross hematuria, straining to void, or sensations of incomplete emptying.   Fall Screening: Do you usually have  a device to assist in your mobility? {yes/no:20286} ***cane / ***walker / ***wheelchair  Medications: Current Outpatient Medications  Medication Sig Dispense Refill   albuterol (VENTOLIN HFA) 108 (90 Base) MCG/ACT inhaler Inhale 2 puffs into the lungs every 6 (six) hours as needed for wheezing or shortness of breath. 8 g 0   aspirin 81 MG tablet Take 81 mg by mouth daily.     busPIRone (BUSPAR) 30 MG tablet Take 0.5-1 tablets (15-30 mg total) by mouth daily as needed. TAKE (1/2) TO (1) TABLET BY MOUTH ONCE DAILY AS NEEDED 90 tablet 3   butalbital-acetaminophen-caffeine (FIORICET) 50-325-40 MG tablet Take 1 tab at onset of migraine.  May repeat in 2 hrs, if needed.  Max dose: 2 tabs/day. This is a 30 day prescription. 12 tablet 5   Calcium Carbonate-Vitamin D 500-125 MG-UNIT TABS Take 1 capsule by mouth daily.     Cyanocobalamin (VITAMIN B 12 PO) Take 1 tablet by mouth daily.      dimenhyDRINATE (DRAMAMINE) 50 MG tablet Take 50 mg by mouth every 8 (eight) hours as needed.     donepezil (ARICEPT) 10 MG tablet Take 1 tablet (10 mg total) by mouth at bedtime. 90 tablet 3   ezetimibe (ZETIA) 10 MG tablet Take 1 tablet (10 mg total) by mouth daily. 90 tablet 3   fluticasone (FLONASE) 50 MCG/ACT nasal spray Place 2 sprays into both nostrils daily. 16 g 6   gabapentin (NEURONTIN) 300 MG capsule Take 1 in the morning, take 2 at night 270 capsule 1   ibuprofen (ADVIL,MOTRIN) 200 MG tablet Take 800 mg by mouth every 6 (six) hours as needed (Pain).      lamoTRIgine (LAMICTAL) 25 MG tablet Take 2 tablets (50 mg total) by mouth 2 (two) times daily. 1 tab twice a day x one week 2 tabs twice a day 120 tablet 5   levothyroxine (SYNTHROID) 150 MCG tablet Take 1 tablet (150 mcg total)  by mouth daily. 90 tablet 3   lisinopril (ZESTRIL) 2.5 MG tablet Take 1 tablet (2.5 mg total) by mouth daily. 90 tablet 3   mirabegron ER (MYRBETRIQ) 25 MG TB24 tablet Take 1 tablet (25 mg total) by mouth daily. 90 tablet 3    Multiple Vitamin (MULTIVITAMIN) capsule Take 1 capsule by mouth daily.     Omega-3 Fatty Acids (FISH OIL) 1000 MG CAPS Take 1,000 mg by mouth daily.     No current facility-administered medications for this visit.    Allergies: Allergies  Allergen Reactions   Atorvastatin Other (See Comments)    Muscle cramps   Meperidine Hcl Nausea And Vomiting    Makes me 'deathly' sick   Statins Other (See Comments)    REACTION: Patient cannot tolerate due cramps   Norco [Hydrocodone-Acetaminophen] Nausea And Vomiting   Sulfonamide Derivatives Other (See Comments)    Unknown reaction    Past Medical History:  Diagnosis Date   Anxiety state, unspecified    Arthritis    Arthropathy, unspecified, site unspecified    Carpal tunnel syndrome    Bilateral   Cataract    Right 7 Left eye remved cateracts   Chronic airway obstruction, not elsewhere classified    patient denies on 02/27/14   Esophageal reflux    Esophageal stricture    Hypertension    Internal hemorrhoids without mention of complication    Malignant neoplasm of breast (female), unspecified site    left breast    Memory disorder 09/24/2013   Moderate obstructive sleep apnea 01/20/2021   Nocturnal leg cramps 06/29/2018   Osteopenia    Other and unspecified hyperlipidemia    Personal history of colonic polyps    adenomatous   Polyneuropathy in other diseases classified elsewhere (HCC) 09/24/2013   Shingles    Sleep apnea    no longer uses cpap   Tubular adenoma of colon    Unspecified hypothyroidism    Past Surgical History:  Procedure Laterality Date   APPENDECTOMY  03/2014   BACK SURGERY     X2   BREAST LUMPECTOMY     COLONOSCOPY  08/03/2018   EYE SURGERY     cataracts bilateral   LAPAROSCOPIC APPENDECTOMY N/A 02/28/2014   Procedure: APPENDECTOMY LAPAROSCOPIC PARTIAL CECECTOMY FOR MUCOCELE OF APPENDIX ;  Surgeon: Karie Soda, MD;  Location: [facility];  Service: General;  Laterality: N/A;   MASTECTOMY     left    POLYPECTOMY     THYROIDECTOMY     TUBAL LIGATION     UPPER GASTROINTESTINAL ENDOSCOPY     Family History  Problem Relation Age of Onset   Bipolar disorder Sister    Early death Mother    Early death Sister    Colon cancer Neg Hx    Esophageal cancer Neg Hx    Rectal cancer Neg Hx    Stomach cancer Neg Hx    Social History   Socioeconomic History   Marital status: Divorced    Spouse name: Not on file   Number of children: 2   Years of education: Not on file   Highest education level: Not on file  Occupational History   Occupation: Retired    Associate Professor: RETIRED  Tobacco Use   Smoking status: Never   Smokeless tobacco: Never  Vaping Use   Vaping status: Never Used  Substance and Sexual Activity   Alcohol use: No    Alcohol/week: 0.0 standard drinks of alcohol   Drug use: No  Sexual activity: Never    Birth control/protection: Post-menopausal  Other Topics Concern   Not on file  Social History Narrative   Patient lives at home alone and she is divorced.   Retired.   Education business course.   Right handed.   Caffeine sometimes tea.    Social Determinants of Health   Financial Resource Strain: Low Risk  (12/05/2020)   Overall Financial Resource Strain (CARDIA)    Difficulty of Paying Living Expenses: Not very hard  Food Insecurity: No Food Insecurity (07/02/2021)   Received from [facility]   Hunger Vital Sign    Worried About Running Out of Food in the Last Year: Never true    Ran Out of Food in the Last Year: Never true  Transportation Needs: No Transportation Needs (12/05/2020)   PRAPARE - Administrator, Civil Service (Medical): No    Lack of Transportation (Non-Medical): No  Physical Activity: Insufficiently Active (09/21/2017)   Exercise Vital Sign    Days of Exercise per Week: 4 days    Minutes of Exercise per Session: 20 min  Stress: No Stress Concern Present (09/21/2017)   [employer] of Occupational Health -  Occupational Stress Questionnaire    Feeling of Stress : Not at all  Social Connections: Unknown (05/15/2021)   Received from [facility], [facility]   Social Network    Social Network: Not on file  Intimate Partner Violence: Unknown (04/07/2021)   Received from [facility], [facility]   HITS    Physically Hurt: Not on file    Insult or Talk Down To: Not on file    Threaten Physical Harm: Not on file    Scream or Curse: Not on file    SUBJECTIVE  Review of Systems*** Constitutional: Patient denies any unintentional weight loss or change in strength lntegumentary: Patient denies any rashes or pruritus Eyes: Patient {Actions; denies-reports:120008} dry eyes ENT: Patient {Actions; denies-reports:120008} dry mouth Cardiovascular: Patient denies chest pain or syncope Respiratory: Patient denies shortness of breath Gastrointestinal: Patient ***denies nausea, vomiting, constipation, or diarrhea Musculoskeletal: Patient denies muscle cramps or weakness Neurologic: Patient denies convulsions or seizures Allergic/Immunologic: Patient denies recent allergic reaction(s) Hematologic/Lymphatic: Patient denies bleeding tendencies Endocrine: Patient denies heat/cold intolerance  GU: As per HPI.  OBJECTIVE There were no vitals filed for this visit. There is no height or weight on file to calculate BMI.  Physical Examination*** Constitutional: No obvious distress; patient is non-toxic appearing  Cardiovascular: No visible lower extremity edema.  Respiratory: The patient does not have audible wheezing/stridor; respirations do not appear labored  Gastrointestinal: Abdomen non-distended Musculoskeletal: Normal ROM of UEs  Skin: No obvious rashes/open sores  Neurologic: CN 2-12 grossly intact Psychiatric: Answered questions appropriately with normal affect  Hematologic/Lymphatic/Immunologic: No obvious bruises or sites of spontaneous bleeding  UA: ***negative *** WBC/hpf, ***  RBC/hpf, bacteria (***) PVR: *** ml  ASSESSMENT OAB (overactive bladder)  We discussed the symptoms of overactive bladder (OAB), which include urinary urgency, frequency, nocturia, with or without urge incontinence.   While we may not know the exact etiology of OAB, several risk factors can be identified.  - We discussed this patient's neurogenic risk factors for OAB-type symptoms including ***T2DM, ***nicotine use, ***.  - Likely exacerbated by ***diuretic use, ***caffeine intake, ***consumption of bladder irritants such as (acidic foods, spicy foods, alcohol).   We discussed the following management options in detail including potential benefits, risks, and side effects: Behavioral therapy: Modify fluid intake Decreasing bladder  irritants (such as caffeine, acidic foods, spicy foods, alcohol) Urge suppression strategies Bladder retraining / timed voiding Double voiding Medication(s): - For anticholinergic medications, we discussed the potential side effects of anticholinergics including dry eyes, dry mouth, constipation, cognitive impairment and urinary retention.  - For beta-3 agonist medication, we discussed the risk for urinary retention and the potential side effect of elevated blood pressure specific to Myrbetriq (which is more likely to occur in individuals with uncontrolled hypertension).  For refractory cases: PTNS (posterior tibial nerve stimulation) Sacral neuromodulation trial (Medtronic lnterStim or Axonics implant) Bladder Botox injections In extreme cases, SP tube placement  ***In order to further evaluate urinary incontinence, She was instructed to complete a 3-day bladder diary.  ***Discussed recommendation for urodynamic testing, which was described in detail including risks such as bleeding, infection, organ/tissue/nerve damage.  She decided to *** ***work on behavioral modifications including ***minimizing caffeine intake and working on ***timed voiding.  Will  plan for follow up in ***8 weeks / *** months or sooner if needed. Pt verbalized understanding and agreement. All questions were answered.  PLAN Advised the following: *** ***. Minimize caffeine intake. ***. Work on timed voiding. ***. Urge suppression strategies. ***. Double/ triple voiding. ***. No follow-ups on file.  No orders of the defined types were placed in this encounter.   It has been explained that the patient is to follow regularly with their PCP in addition to all other providers involved in their care and to follow instructions provided by these respective offices. Patient advised to contact urology clinic if any urologic-pertaining questions, concerns, new symptoms or problems arise in the interim period.  There are no Patient Instructions on file for this visit.  Electronically signed by:  Donnita Falls, MSN, FNP-C, CUNP 11/08/2022 2:35 PM

## 2022-11-09 ENCOUNTER — Encounter: Payer: Self-pay | Admitting: Urology

## 2022-11-09 ENCOUNTER — Ambulatory Visit: Payer: Medicare Other | Admitting: Urology

## 2022-11-09 VITALS — BP 156/72 | HR 60 | Temp 97.8°F

## 2022-11-09 DIAGNOSIS — R8290 Unspecified abnormal findings in urine: Secondary | ICD-10-CM

## 2022-11-09 DIAGNOSIS — R682 Dry mouth, unspecified: Secondary | ICD-10-CM | POA: Insufficient documentation

## 2022-11-09 DIAGNOSIS — N3281 Overactive bladder: Secondary | ICD-10-CM

## 2022-11-09 DIAGNOSIS — N3946 Mixed incontinence: Secondary | ICD-10-CM | POA: Insufficient documentation

## 2022-11-09 DIAGNOSIS — N3942 Incontinence without sensory awareness: Secondary | ICD-10-CM | POA: Diagnosis not present

## 2022-11-09 DIAGNOSIS — I10 Essential (primary) hypertension: Secondary | ICD-10-CM

## 2022-11-09 LAB — URINALYSIS, ROUTINE W REFLEX MICROSCOPIC
Bilirubin, UA: NEGATIVE
Glucose, UA: NEGATIVE
Ketones, UA: NEGATIVE
Nitrite, UA: NEGATIVE
Protein,UA: NEGATIVE
Specific Gravity, UA: <1.005 — ABNORMAL LOW (ref 1.005–1.030)
Urobilinogen, Ur: 0.2 mg/dL (ref 0.2–1.0)
pH, UA: 7 (ref 5.0–7.5)

## 2022-11-09 LAB — MICROSCOPIC EXAMINATION: WBC, UA: >30 /[HPF] — AB (ref 0–5)

## 2022-11-09 MED ORDER — GEMTESA 75 MG PO TABS
1.0000 | ORAL_TABLET | Freq: Every day | ORAL | 2 refills | Status: DC
Start: 2022-11-09 — End: 2022-12-23

## 2022-11-09 NOTE — Progress Notes (Signed)
post void residual=1

## 2022-11-09 NOTE — Patient Instructions (Signed)
Overactive bladder (OAB) overview for patients:  Symptoms may include: urinary urgency ("gotta go" feeling) urinary frequency (voiding >8 times per day) night time urination (nocturia) urge incontinence of urine (UUI)  While we do not know the exact etiology of OAB, several treatment options exist including:  Behavioral therapy: Reducing fluid intake Decreasing bladder stimulants (such as caffeine) and irritants (such as acidic food, spicy foods, alcohol) Urge suppression strategies Bladder retraining via timed voiding  Pelvic floor physical therapy  Medication(s) - can use one or both of the drug classes below. Anticholinergic / antimuscarinic medications:  Mechanism of action: Activate M3 receptors to reduce detrusor stimulation and increase bladder capacity  (parasympathetic nervous system). Effect: Relaxes the bladder to decrease overactivity, increase bladder storage capacity, and increase time between voids. Onset: Slow acting (may take 8-12 weeks to determine efficacy). Medications include: Vesicare (Solifenacin), Ditropan (Oxybutynin), Detrol (Tolterodine), Toviaz (Fesoterodine), Sanctura (Trospium), Urispas (Flavoxate), Enablex (Darifenacin), Bentyl (Dicyclomine), Levsin (Hyoscyamine ). Potential side effects include but are not limited to: Dry eyes, dry mouth, constipation, cognitive impairment, dementia risk with long term use, and urinary retention/ incomplete bladder emptying. Insurance companies generally prefer for patients to try 1-2 anticholinergic / antimuscarinic medications first due to low cost. Some exceptions are made based on patient-specific comorbidities / risk factors. Beta-3 agonist medications: Mechanism of action: Stimulates selective B3 adrenergic receptors to cause smooth muscle bladder relaxation (sympathetic nervous system). Effect: Relaxes the bladder to decrease overactivity, increase bladder storage capacity, and increase time  between voids. Onset: Slow acting (may take 8-12 weeks to determine efficacy). Medications include: Myrbetriq (Mirabegron) and Vibegron Leslye Peer). Potential side effects include but are not limited to: urinary retention / incomplete bladder emptying and elevated blood pressure (more likely to occur in individuals with pre-existing uncontrolled hypertension). These medications tend to be more expensive than the anticholinergic / antimuscarinic medications.   For patients with refractory OAB (if the above treatment options have been unsuccessful): Posterior tibial nerve stimulation (PTNS). Small acupuncture-type needle inserted near ankle with electric current to stimulate bladder via posterior tibial nerve pathway. Initially requires 12 weekly in-office treatments lasting 30 minutes each; followed by monthly in-office treatments lasting 30 minutes each for 1 year.  Bladder Botox injections. How it is done: Typically done via in-office cystoscopy; sometimes done in the OR depending on the situation. The bladder is numbed with lidocaine instilled via a catheter. Then the urologist injects Botox into the bladder muscle wall in about 20 locations. Causes local paralysis of the bladder muscle at the injection sites to reduce bladder muscle overactivity / spasms. The effect lasts for approximately 6 months and cannot be reversed once performed. Risks may included but are not limited to: infection, incomplete bladder emptying/ urinary retention, short term need for self-catheterization or indwelling catheter, and need for repeat therapy. There is a 5-12% chance of needing to catheterize with Botox - that usually resolves in a few months as the Botox wears off. Typically Botox injections would need to be repeated every 3-12 months since this is not a permanent therapy.  Sacral neuromodulation trial (Medtronic lnterStim or Axonics implant). Sacral neuromodulation is FDA-approved for uncontrolled urinary  urgency, urinary frequency, urinary urge incontinence, non-obstructive urinary retention, or fecal incontinence. It is not FDA-approved as a treatment for pain. The goal of this therapy is at least a 50% improvement in symptoms. It is NOT realistic to expect a 100% cure. This is a a 2-step outpatient procedure. After a successful  test period, a permanent wire and generator are placed in the OR. We discussed the risk of infection. We reviewed the fact that about 30% of patients fail the test phase and are not candidates for permanent generator placement. During the 1-2 week trial phase, symptoms are documented by the patient to determine response. If patient gets at least a 50% improvement in symptoms, they may then proceed with Step 2. Step 1: Trial lead placement. Per physician discretion, may done one of two ways: Percutaneous nerve evaluation (PNE) in the [facility] urology office. Performed by urologist under local anesthesia (numbing the area with lidocaine) using a spinal needle for placement of test wire, which usually stays in place for 5-7 days to determine therapy response. Test lead placement in OR under anesthesia. Usually stays in place 2 weeks to determine therapy response. > Step 2: Permanent implantation of sacral neuromodulation device, which is performed in the OR.  Sacral neuromodulation implants: All are conditionally MRI safe. Manufacturer: Medtronic Website: [employer]gl therapy/right-for-you.html Options: lnterStim X: Non-rechargeable. The battery lasts 10 years on average. lnterStim Micro: Rechargeable. The battery lasts 15 years on average and must be charged routinely. Approximately 50% smaller implant than lnterStim X implant.  Manufacturer: Axonics Website: Findrealrelief.axonics.com Options: Non-rechargeable (Axonics F15): The battery lasts 15 years on average. Rechargeable (Axonics R20):  The battery lasts 20 years on average and must be charged in office for about 1 hour every 6-10 months on average. Approximately 50% smaller implant than Axonics non-rechargeable implant.  Note: Generally the rechargeable devices are only advised for very small or thin patients who may not have sufficient adipose tissue to comfortably overlay the implanted device.  Suprapubic catheter (SP tube) placement. Only done in severely refractory OAB when all other options have failed or are not a viable treatment choice depending on patient factors. Involves placement of a catheter through the lower abdomen into the bladder to continuously drain the bladder into an external collection bag, which patient can then empty at their convenience every few hours. Done via an outpatient surgical procedure in the OR under anesthesia. Risks may included but are not limited to: surgical site pain, infections, skin irritation / breakdown, chronic bacteriuria, symptomatic UTls. The SP tube must stay in place continuously. This is a reversible procedure however - the insertion site will close if catheter is removed for more than a few hours. The SP tube must be exchanged routinely every 4 weeks to prevent the catheter from becoming clogged with sediment. SP tube exchanges are typically performed at a urology nurse visit or by a home health nurse.

## 2022-11-12 LAB — URINE CULTURE

## 2022-11-15 ENCOUNTER — Ambulatory Visit (INDEPENDENT_AMBULATORY_CARE_PROVIDER_SITE_OTHER): Payer: Medicare Other | Admitting: Neurology

## 2022-11-15 ENCOUNTER — Telehealth: Payer: Self-pay

## 2022-11-15 ENCOUNTER — Other Ambulatory Visit: Payer: Self-pay | Admitting: Urology

## 2022-11-15 DIAGNOSIS — R413 Other amnesia: Secondary | ICD-10-CM

## 2022-11-15 DIAGNOSIS — R4020 Unspecified coma: Secondary | ICD-10-CM

## 2022-11-15 DIAGNOSIS — N3000 Acute cystitis without hematuria: Secondary | ICD-10-CM

## 2022-11-15 DIAGNOSIS — M5442 Lumbago with sciatica, left side: Secondary | ICD-10-CM

## 2022-11-15 DIAGNOSIS — R269 Unspecified abnormalities of gait and mobility: Secondary | ICD-10-CM

## 2022-11-15 DIAGNOSIS — G4452 New daily persistent headache (NDPH): Secondary | ICD-10-CM

## 2022-11-15 MED ORDER — CEPHALEXIN 500 MG PO CAPS
500.0000 mg | ORAL_CAPSULE | Freq: Three times a day (TID) | ORAL | 0 refills | Status: AC
Start: 2022-11-15 — End: 2022-11-22

## 2022-11-15 NOTE — Telephone Encounter (Signed)
-----   Message from Donnita Falls sent at 11/15/2022  8:47 AM EST ----- Please let pt know result of urine culture. I have sent prescription for Keflex. Thanks.

## 2022-11-15 NOTE — Telephone Encounter (Signed)
Unable to reach patient by phone, daughter is aware of culture results and abx  at pharmacy.

## 2022-11-30 NOTE — Procedures (Signed)
   HISTORY: 83 year old female with frequent headaches, TECHNIQUE:  This is a routine 16 channel EEG recording with one channel devoted to a limited EKG recording.  It was performed during wakefulness, drowsiness and asleep.  Hyperventilation and photic stimulation were performed as activating procedures.  There are minimum muscle and movement artifact noted.  Upon maximum arousal, posterior dominant waking rhythm consistent of mildly dysrhythmic low amplitude theta range activity. Activities are symmetric over the bilateral posterior derivations and attenuated with eye opening.  Photic stimulation did not alter the tracing.  Hyperventilation produced mild/moderate buildup with higher amplitude and the slower activities noted.  During EEG recording, patient developed drowsiness and entered sleep, sleep EEG demonstrated architecture, there were frontal centrally dominant vertex waves and symmetric sleep spindles noted.  During EEG recording, there was no epileptiform discharge noted.  EKG demonstrate normal sinus rhythm.  CONCLUSION: This is a mild abnormal awake EEG, there is evidence of mild background slowing, indicating mild bihemispheric malfunction.  Levert Feinstein, M.D. Ph.D.  [facility] [address] [place], [place] [address] Phone: [phone] Fax:      [phone]

## 2022-12-21 NOTE — Progress Notes (Unsigned)
Name: [name] DOB: 11-Nov-1940 MRN: [id number]  History of Present Illness: [name] is a 82 y.o. female who presents today for follow up visit at [facility].  - GU history: 1. OAB with urinary frequency, nocturia, urgency, and urge incontinence (UUI predominant compared to SUI). - Neurogenic risk factors: Polyneuropathy.   - May be exacerbated by her persistent BLE edema; she wears compression socks. - Has OSA; wears her CPAP "most nights".  - Reports only 1 caffeinated beverage/day typically.  2. Stress urinary incontinence.  At last visit on 11/09/2022: - Reported Myrbetriq 25 mg daily only somewhat helpful.  - Decided to discontinue Myrbetriq 25 mg daily due to sub-optimal OAB symptom improvement and concerns that it may be contributing to elevated blood pressure.  - Plan was to trial Gemtesa 75 mg daily as alternative.  - Urine culture positive for E. Coli; treated with Keflex.  Today: She denies any noticeable change in her urinary symptoms since starting Gemtesa (Vibegron) 75 mg daily.  She reports persistent / unchanged urinary frequency, nocturia x3-4, urgency, and urge incontinence. Continues to wear about 4 pad per day on average.  She reports intermittent dysuria; denies fevers, gross hematuria, straining to void, or sensations of incomplete emptying.   Fall Screening: Do you usually have a device to assist in your mobility? No   Medications: Current Outpatient Medications  Medication Sig Dispense Refill   albuterol (VENTOLIN HFA) 108 (90 Base) MCG/ACT inhaler Inhale 2 puffs into the lungs every 6 (six) hours as needed for wheezing or shortness of breath. 8 g 0   aspirin 81 MG tablet Take 81 mg by mouth daily.     busPIRone (BUSPAR) 30 MG tablet Take 0.5-1 tablets (15-30 mg total) by mouth daily as needed. TAKE (1/2) TO (1) TABLET BY MOUTH ONCE DAILY AS NEEDED 90 tablet 3   butalbital-acetaminophen-caffeine (FIORICET) 50-325-40 MG tablet Take  1 tab at onset of migraine.  May repeat in 2 hrs, if needed.  Max dose: 2 tabs/day. This is a 30 day prescription. 12 tablet 5   Calcium Carbonate-Vitamin D 500-125 MG-UNIT TABS Take 1 capsule by mouth daily.     Cyanocobalamin (VITAMIN B 12 PO) Take 1 tablet by mouth daily.      dimenhyDRINATE (DRAMAMINE) 50 MG tablet Take 50 mg by mouth every 8 (eight) hours as needed.     donepezil (ARICEPT) 10 MG tablet Take 1 tablet (10 mg total) by mouth at bedtime. 90 tablet 3   estradiol (ESTRACE) 0.1 MG/GM vaginal cream Discard plastic applicator. Insert a blueberry size amount (approximately 1 gram) of cream on fingertip inside vagina at bedtime every night for 1 week then every other night. For long term use. 30 g 3   ezetimibe (ZETIA) 10 MG tablet Take 1 tablet (10 mg total) by mouth daily. 90 tablet 3   fluticasone (FLONASE) 50 MCG/ACT nasal spray Place 2 sprays into both nostrils daily. 16 g 6   gabapentin (NEURONTIN) 300 MG capsule Take 1 in the morning, take 2 at night 270 capsule 1   ibuprofen (ADVIL,MOTRIN) 200 MG tablet Take 800 mg by mouth every 6 (six) hours as needed (Pain).      lamoTRIgine (LAMICTAL) 25 MG tablet Take 2 tablets (50 mg total) by mouth 2 (two) times daily. 1 tab twice a day x one week 2 tabs twice a day 120 tablet 5   levothyroxine (SYNTHROID) 150 MCG tablet Take 1 tablet (150 mcg total) by mouth daily.  90 tablet 3   lisinopril (ZESTRIL) 2.5 MG tablet Take 1 tablet (2.5 mg total) by mouth daily. 90 tablet 3   Multiple Vitamin (MULTIVITAMIN) capsule Take 1 capsule by mouth daily.     Omega-3 Fatty Acids (FISH OIL) 1000 MG CAPS Take 1,000 mg by mouth daily.     Vibegron (GEMTESA) 75 MG TABS Take 1 tablet (75 mg total) by mouth daily. 90 tablet 3   No current facility-administered medications for this visit.    Allergies: Allergies  Allergen Reactions   Atorvastatin Other (See Comments)    Muscle cramps   Meperidine Hcl Nausea And Vomiting    Makes me 'deathly' sick    Statins Other (See Comments)    REACTION: Patient cannot tolerate due cramps   Norco [Hydrocodone-Acetaminophen] Nausea And Vomiting   Sulfonamide Derivatives Other (See Comments)    Unknown reaction    Past Medical History:  Diagnosis Date   Anxiety state, unspecified    Arthritis    Arthropathy, unspecified, site unspecified    Carpal tunnel syndrome    Bilateral   Cataract    Right 7 Left eye remved cateracts   Chronic airway obstruction, not elsewhere classified    patient denies on 02/27/14   Esophageal reflux    Esophageal stricture    Hypertension    Internal hemorrhoids without mention of complication    Malignant neoplasm of breast (female), unspecified site    left breast    Memory disorder 09/24/2013   Moderate obstructive sleep apnea 01/20/2021   Nocturnal leg cramps 06/29/2018   Osteopenia    Other and unspecified hyperlipidemia    Personal history of colonic polyps    adenomatous   Polyneuropathy in other diseases classified elsewhere (HCC) 09/24/2013   Shingles    Sleep apnea    no longer uses cpap   Tubular adenoma of colon    Unspecified hypothyroidism    Past Surgical History:  Procedure Laterality Date   APPENDECTOMY  03/2014   BACK SURGERY     X2   BREAST LUMPECTOMY     COLONOSCOPY  08/03/2018   EYE SURGERY     cataracts bilateral   LAPAROSCOPIC APPENDECTOMY N/A 02/28/2014   Procedure: APPENDECTOMY LAPAROSCOPIC PARTIAL CECECTOMY FOR MUCOCELE OF APPENDIX ;  Surgeon: Karie Soda, MD;  Location: [facility];  Service: General;  Laterality: N/A;   MASTECTOMY     left   POLYPECTOMY     THYROIDECTOMY     TUBAL LIGATION     UPPER GASTROINTESTINAL ENDOSCOPY     Family History  Problem Relation Age of Onset   Bipolar disorder Sister    Early death Mother    Early death Sister    Colon cancer Neg Hx    Esophageal cancer Neg Hx    Rectal cancer Neg Hx    Stomach cancer Neg Hx    Social History   Socioeconomic History   Marital status: Divorced     Spouse name: Not on file   Number of children: 2   Years of education: Not on file   Highest education level: Not on file  Occupational History   Occupation: Retired    Associate Professor: RETIRED  Tobacco Use   Smoking status: Never   Smokeless tobacco: Never  Vaping Use   Vaping status: Never Used  Substance and Sexual Activity   Alcohol use: No    Alcohol/week: 0.0 standard drinks of alcohol   Drug use: No   Sexual activity: Never  Birth control/protection: Post-menopausal  Other Topics Concern   Not on file  Social History Narrative   Patient lives at home alone and she is divorced.   Retired.   Education business course.   Right handed.   Caffeine sometimes tea.    Social Drivers of Corporate investment banker Strain: Low Risk  (12/05/2020)   Overall Financial Resource Strain (CARDIA)    Difficulty of Paying Living Expenses: Not very hard  Food Insecurity: No Food Insecurity (07/02/2021)   Received from [facility]   Hunger Vital Sign    Worried About Running Out of Food in the Last Year: Never true    Ran Out of Food in the Last Year: Never true  Transportation Needs: No Transportation Needs (12/05/2020)   PRAPARE - Administrator, Civil Service (Medical): No    Lack of Transportation (Non-Medical): No  Physical Activity: Insufficiently Active (09/21/2017)   Exercise Vital Sign    Days of Exercise per Week: 4 days    Minutes of Exercise per Session: 20 min  Stress: No Stress Concern Present (09/21/2017)   [employer] of Occupational Health - Occupational Stress Questionnaire    Feeling of Stress : Not at all  Social Connections: Unknown (05/15/2021)   Received from [facility], [facility]   Social Network    Social Network: Not on file  Intimate Partner Violence: Unknown (04/07/2021)   Received from [facility], [facility]   HITS    Physically Hurt: Not on file    Insult or Talk Down To: Not on file    Threaten Physical  Harm: Not on file    Scream or Curse: Not on file    Review of Systems Constitutional: Patient denies any unintentional weight loss or change in strength lntegumentary: Patient denies any rashes or pruritus Eyes: Patient denies dry eyes ENT: Patient reports dry mouth Cardiovascular: Patient denies chest pain or syncope Respiratory: Patient denies shortness of breath Gastrointestinal: Patient denies nausea, vomiting, constipation, or diarrhea Musculoskeletal: Patient denies muscle cramps or weakness Neurologic: Patient denies convulsions or seizures Allergic/Immunologic: Patient denies recent allergic reaction(s) Hematologic/Lymphatic: Patient denies bleeding tendencies Endocrine: Patient denies heat/cold intolerance  GU: As per HPI.  OBJECTIVE Vitals:   12/23/22 1052  BP: (!) 158/80  Pulse: (!) 57  Temp: 97.9 F (36.6 C)   There is no height or weight on file to calculate BMI.  Physical Examination Constitutional: No obvious distress; patient is non-toxic appearing  Cardiovascular: No visible lower extremity edema.  Respiratory: The patient does not have audible wheezing/stridor; respirations do not appear labored  Gastrointestinal: Abdomen non-distended Musculoskeletal: Normal ROM of UEs  Skin: No obvious rashes/open sores  Neurologic: CN 2-12 grossly intact Psychiatric: Answered questions appropriately with normal affect  Hematologic/Lymphatic/Immunologic: No obvious bruises or sites of spontaneous bleeding  Urine microscopy: >30 WBC/hpf, 3-10 RBC/hpf, few bacteria PVR: 90 ml  ASSESSMENT Mixed stress and urge urinary incontinence - Plan: Urinalysis, Routine w reflex microscopic, BLADDER SCAN AMB NON-IMAGING, Vibegron (GEMTESA) 75 MG TABS  OAB (overactive bladder) - Plan: Urinalysis, Routine w reflex microscopic, BLADDER SCAN AMB NON-IMAGING, Vibegron (GEMTESA) 75 MG TABS  Urinary incontinence without sensory awareness - Plan: Vibegron (GEMTESA) 75 MG  TABS  Atrophic vaginitis - Plan: estradiol (ESTRACE) 0.1 MG/GM vaginal cream  UA today appears abnormal however patient is asymptomatic for UTI, therefore acute antibiotic treatment is not advised at this time. The rationale for this was discussed with the patient.   We  discussed that her intermittent dysuria with no additional acute accompanying UTI symptoms is likely due to vaginal atrophy. - The etiology and consequences of urogenital epithelial atrophy was explained to patient. The thinning of the epithelium of the urethra can contribute to urinary urgency and frequency syndromes. In addition, the normal bacterial flora that colonizes the perineum may contribute to UTI risk because the thin urethral epithelium allows the bacteria to become adherent and the change in vaginal pH can disrupt the vaginal / urethral microbiome and allow for bacterial overgrowth. - Patient was advised that topical vaginal estrogen replacement will take about 3 months to restore the vaginal pH and may sting/burn initially due to severe dryness, which will improve with ongoing treatment.  - We discussed that there have been studies that evaluate use of low-dose intravaginal estrogen cream that shows minimal systemic absorption that is negligible after 3 weeks. There have been no studies indicating increased risk of contributing to breast cancer development or recurrence.  She elected to start topical vaginal estrogen cream use.   For refractory OAB symptoms we discussed the following management options in detail including potential benefits, risks, and side effects: Behavioral therapy: Medication(s): - Not a safe candidate for anticholinergic medications due to age, comorbidities including pre-exisitng dry mouth, and risk for potential side effects including dry eyes, constipation, cognitive impairment and urinary retention.  - Continue beta-3 agonist medication For refractory cases: PTNS (posterior tibial nerve  stimulation) Sacral neuromodulation trial (Medtronic lnterStim or Axonics implant) Bladder Botox injections In extreme cases, SP tube placement  She decided to continue Gemtesa 75 mg daily.  Will plan for follow up in 6 months or sooner if needed. Pt verbalized understanding and agreement. All questions were answered.  PLAN Advised the following: 1. Continue Gemtesa 75 mg daily. 2. Start topical vaginal estrogen cream use as prescribed. 3. Return in about 6 months (around 06/23/2023) for UA, PVR, & f/u with Evette Georges NP.  Orders Placed This Encounter  Procedures   Urinalysis, Routine w reflex microscopic   BLADDER SCAN AMB NON-IMAGING    It has been explained that the patient is to follow regularly with their PCP in addition to all other providers involved in their care and to follow instructions provided by these respective offices. Patient advised to contact urology clinic if any urologic-pertaining questions, concerns, new symptoms or problems arise in the interim period.  Patient Instructions  Vulvovaginal atrophy I Genitourinary syndrome of menopause (GSM):  What it is: Changes in the vaginal environment (including the vulva and urethra) including: Thinning of the epithelium (skin/ mucosa surface) Can contribute to urinary urgency and frequency Can contribute to dryness, itching, irritation of the vulvar and vaginal tissue Can contribute to pain with intercourse Can contribute to physical changes of the labia, vulva, and vagina such as: Narrowing of the vaginal opening Decreased vaginal length Loss of labial architecture Labial adhesions Pale color of vulvovaginal tissue  Loss of pubic hair Allows bacteria to become adherent  Results in increased risk for urinary tract infection (UTI) due to bacterial overgrowth and migration up the urethra into the bladder Change in vaginal pH (acid/ base balance) Allows for alteration / disruption of the normal bacterial flora /  microbiome Results in increased risk for urinary tract infection (UTI) due to bacterial overgrowth  Treatment options: Over-the-counter lubricants (see list below). Prescription vaginal estrogen replacement. Options: Topical vaginal estrogen cream Estrace, Premarin, or compounded estradiol cream/ gel We advise: Discard plastic applicator as that tends to use more  medication than you need, which is not harmful but wastes / uses up the medication. Also the plastic applicator may cause discomfort. Insert blueberry size amount of medication via the tip of your finger inside vagina nightly for 1 week then 2-3 times per week (long term). Estring vaginal ring Exchanged every 3 months (either at home or in office by provider) Vagifem vaginal tablet Inserted nightly for 2 weeks then twice a week (long term) lntrarosa vaginal suppository Vaginal DHEA: converts to estrogen in vaginal tissue without systemic effect Inserted nightly (long term) Vaginal laser therapy (Mona Lisa touch) Performed in 3 treatments each 6 weeks apart (available in our [place] office). Can feel like a sunburn for 3-4 days after each treatment until new skin heals in. Usually not covered by insurance. Estimated cost is $1500 for all 3 sessions.  FYI regarding prescription vaginal estrogen treatment options: All topical vaginal estrogen replacement options are equivalent in terms of efficacy. Topical vaginal estrogen replacement will take about 3 months to be effective. OK to have sex with any of the topical vaginal estrogen replacement options. Topical vaginal estrogen replacement may sting/burn initially due to severe dryness, which will improve with ongoing treatment. There have been studies that evaluate use of low-dose intravaginal estrogen that show minimal systemic absorption which is negligible after 3 weeks. There have been no studies indicating increased risk of contributing to cancer development or  recurrence.  Topical vaginal estrogen cream safe to use with breast cancer history [email or web address]  Topical vaginal estrogen cream safe to use with blood clot history [email or web address]   Lubricants and Moisturizers for Treating Genitourinary Syndrome of Menopause and Vulvovaginal Atrophy Treatment Comments I Available Products   Lubricants   Water-based Ingredients: Deionized water, glycerin, propylene glycol; latex safe; rare irritation; dry out with extended sexual activity Astroglide, Good Clean Love, K-Y Jelly, Natural, Organic, Pink, Sliquid, Sylk, Yes    Oil Based Ingredients: avocado, olive, peanut, corn; latex safe; can be used with silicone products; staining; safe (unless peanut allergy); non-irritating Coconut oil, vegetable oil, vitamin E oil  Silicone-Based Ingredients: Silicone polymers; staining; typically nonirritating, long lasting; waterproof; should not be used with silicone dilators, sexual toys, or gynecologic products Astroglide X, Oceanus Ultra Pure, Pink Silicone, Pjur Eros, Replens Silky Smooth, Silicone Premium JO, SKYN, Uberlube, [employer] Based Minimize harm to sperm motility; designed Astroglide TTC, Conceive Plus, Pre for couples trying to conceive Seed, Yes Baby  Fertility Friendly Minimize harm to sperm motility; designed Astroglide, TTC, Conceive Plus, Pre for couples trying to conceive Seed, Yes Baby  Vaginal Moisturizers   Vaginal Moisturizers For maintenance use 1 to 3 times weekly; can benefit women with dryness, chafing with AOL, and recurrent vaginal infections irrespective of sexual activity timing Balance Active Menopause Vaginal Moisturizing Lubricant, Canesintima Intimate Moisturizer,  Replens, Rephresh, Sylk Natural Intimate Moisturizer, Yes Vaginal Moisturizer  Hybrids Properties of both water and silicone-based products (combination of a vaginal lubricant and moisturizer); Non-irritating; good option for women with allergies and sensitivities Lubrigyn, Luvena  Suppositories Hyaluronic acid to retain moisture Revaree  Vulvar Soothing Creams/Oils    Medicated CreamsP ain and burn relief; Ingredients: 4% Lidocaine, Aloe Vera gel Releveum (Desert Dobson)  Non-Medicated Creams For anti-itch and moisture/maintenance; Ingredients: Coconut oil, Avocado oil, Shea Butter, Olive oil, Vitamin E Vajuvenate, Vmagic  Oils !For moisture/maintenance !Coconut oil, Vitamin E oil, Emu oil                 Overactive bladder (OAB) overview for patients:  Symptoms may include: urinary urgency ("gotta go" feeling) urinary frequency (voiding >8 times per day) night time urination (nocturia) urge incontinence of urine (UUI)  While we do not know the exact etiology of OAB, several treatment options exist including:  Behavioral therapy: Reducing fluid intake Decreasing bladder stimulants (such as caffeine) and irritants (such as acidic food, spicy foods, alcohol) Urge suppression strategies Bladder retraining via timed voiding  Pelvic floor physical therapy  Medication(s) - can use one or both of the drug classes below. Anticholinergic / antimuscarinic medications:  Mechanism of action: Activate M3 receptors to reduce detrusor stimulation and increase bladder capacity   (parasympathetic nervous system). Effect: Relaxes the bladder to decrease overactivity, increase bladder storage capacity, and increase time between voids. Onset: Slow acting (may take 8-12 weeks to determine efficacy). Medications include: Vesicare (Solifenacin), Ditropan (Oxybutynin), Detrol (Tolterodine), Toviaz (Fesoterodine), Sanctura (Trospium), Urispas (Flavoxate), Enablex (Darifenacin), Bentyl  (Dicyclomine), Levsin (Hyoscyamine ). Potential side effects include but are not limited to: Dry eyes, dry mouth, constipation, cognitive impairment, dementia risk with long term use, and urinary retention/ incomplete bladder emptying. Insurance companies generally prefer for patients to try 1-2 anticholinergic / antimuscarinic medications first due to low cost. Some exceptions are made based on patient-specific comorbidities / risk factors. Beta-3 agonist medications: Mechanism of action: Stimulates selective B3 adrenergic receptors to cause smooth muscle bladder relaxation (sympathetic nervous system). Effect: Relaxes the bladder to decrease overactivity, increase bladder storage capacity, and increase time between voids. Onset: Slow acting (may take 8-12 weeks to determine efficacy). Medications include: Myrbetriq (Mirabegron) and Vibegron Leslye Peer). Potential side effects include but are not limited to: urinary retention / incomplete bladder emptying and elevated blood pressure (more likely to occur in individuals with pre-existing uncontrolled hypertension). These medications tend to be more expensive than the anticholinergic / antimuscarinic medications.   For patients with refractory OAB (if the above treatment options have been unsuccessful): Posterior tibial nerve stimulation (PTNS). Small acupuncture-type needle inserted near ankle with electric current to stimulate bladder via posterior tibial nerve pathway. Initially requires 12 weekly in-office treatments lasting 30 minutes each; followed by monthly in-office treatments lasting 30 minutes each for 1 year.  Bladder Botox injections. How it is done: Typically done via in-office cystoscopy; sometimes done in the OR depending on the situation. The bladder is numbed with lidocaine instilled via a catheter. Then the urologist injects Botox into the bladder muscle wall in about 20 locations. Causes local paralysis of the bladder muscle at the  injection sites to reduce bladder muscle overactivity / spasms. The effect lasts for approximately 6 months and cannot be reversed once performed. Risks may included but are not limited to: infection, incomplete bladder emptying/ urinary retention, short term need for self-catheterization or indwelling catheter, and need for repeat therapy. There is a 5-12% chance of needing to catheterize with Botox - that usually resolves in a few months as the Botox wears off. Typically Botox injections would need to be repeated every 3-12 months since this is not a permanent therapy.  Sacral neuromodulation trial (Medtronic lnterStim or Axonics implant). Sacral neuromodulation is FDA-approved for uncontrolled urinary urgency, urinary frequency, urinary urge incontinence, non-obstructive urinary retention, or fecal incontinence. It is not FDA-approved as a treatment for pain. The goal of this therapy is at least a 50% improvement in symptoms. It is NOT realistic to expect a 100% cure. This is a a 2-step outpatient procedure. After a successful test period, a permanent wire and generator are placed in the OR. We discussed  the risk of infection. We reviewed the fact that about 30% of patients fail the test phase and are not candidates for permanent generator placement. During the 1-2 week trial phase, symptoms are documented by the patient to determine response. If patient gets at least a 50% improvement in symptoms, they may then proceed with Step 2. Step 1: Trial lead placement. Per physician discretion, may done one of two ways: Percutaneous nerve evaluation (PNE) in the [facility] urology office. Performed by urologist under local anesthesia (numbing the area with lidocaine) using a spinal needle for placement of test wire, which usually stays in place for 5-7 days to determine therapy response. Test lead placement in OR under anesthesia. Usually stays in place 2 weeks to determine therapy response. > Step 2: Permanent  implantation of sacral neuromodulation device, which is performed in the OR.  Sacral neuromodulation implants: All are conditionally MRI safe. Manufacturer: Medtronic Website: BuffaloDryCleaner.gl therapy/right-for-you.html Options: lnterStim X: Non-rechargeable. The battery lasts 10 years on average. lnterStim Micro: Rechargeable. The battery lasts 15 years on average and must be charged routinely. Approximately 50% smaller implant than lnterStim X implant.  Manufacturer: Axonics Website: Findrealrelief.axonics.com Options: Non-rechargeable (Axonics F15): The battery lasts 15 years on average. Rechargeable (Axonics R20): The battery lasts 20 years on average and must be charged in office for about 1 hour every 6-10 months on average. Approximately 50% smaller implant than Axonics non-rechargeable implant.  Note: Generally the rechargeable devices are only advised for very small or thin patients who may not have sufficient adipose tissue to comfortably overlay the implanted device.  Suprapubic catheter (SP tube) placement. Only done in severely refractory OAB when all other options have failed or are not a viable treatment choice depending on patient factors. Involves placement of a catheter through the lower abdomen into the bladder to continuously drain the bladder into an external collection bag, which patient can then empty at their convenience every few hours. Done via an outpatient surgical procedure in the OR under anesthesia. Risks may included but are not limited to: surgical site pain, infections, skin irritation / breakdown, chronic bacteriuria, symptomatic UTls. The SP tube must stay in place continuously. This is a reversible procedure however - the insertion site will close if catheter is removed for more than a few hours. The SP tube must be exchanged routinely every 4 weeks to prevent the catheter from  becoming clogged with sediment. SP tube exchanges are typically performed at a urology nurse visit or by a home health nurse.   Electronically signed by:  Donnita Falls, FNP   12/23/22    11:38 AM

## 2022-12-23 ENCOUNTER — Ambulatory Visit: Payer: Medicare Other | Admitting: Urology

## 2022-12-23 ENCOUNTER — Encounter: Payer: Self-pay | Admitting: Urology

## 2022-12-23 VITALS — BP 158/80 | HR 57 | Temp 97.9°F

## 2022-12-23 DIAGNOSIS — N3942 Incontinence without sensory awareness: Secondary | ICD-10-CM

## 2022-12-23 DIAGNOSIS — N3281 Overactive bladder: Secondary | ICD-10-CM | POA: Diagnosis not present

## 2022-12-23 DIAGNOSIS — N3946 Mixed incontinence: Secondary | ICD-10-CM | POA: Diagnosis not present

## 2022-12-23 DIAGNOSIS — N952 Postmenopausal atrophic vaginitis: Secondary | ICD-10-CM | POA: Diagnosis not present

## 2022-12-23 LAB — URINALYSIS, ROUTINE W REFLEX MICROSCOPIC
Bilirubin, UA: NEGATIVE
Glucose, UA: NEGATIVE
Ketones, UA: NEGATIVE
Nitrite, UA: NEGATIVE
Protein,UA: NEGATIVE
Specific Gravity, UA: <1.005 — ABNORMAL LOW (ref 1.005–1.030)
Urobilinogen, Ur: 0.2 mg/dL (ref 0.2–1.0)
pH, UA: 6.5 (ref 5.0–7.5)

## 2022-12-23 LAB — MICROSCOPIC EXAMINATION: WBC, UA: >30 /[HPF] — AB (ref 0–5)

## 2022-12-23 LAB — BLADDER SCAN AMB NON-IMAGING: Scan Result: 90

## 2022-12-23 MED ORDER — GEMTESA 75 MG PO TABS
1.0000 | ORAL_TABLET | Freq: Every day | ORAL | 3 refills | Status: AC
Start: 2022-12-23 — End: ?

## 2022-12-23 MED ORDER — ESTRADIOL 0.1 MG/GM VA CREA
TOPICAL_CREAM | VAGINAL | 3 refills | Status: AC
Start: 2022-12-23 — End: ?

## 2022-12-23 NOTE — Patient Instructions (Addendum)
Vulvovaginal atrophy I Genitourinary syndrome of menopause (GSM):  What it is: Changes in the vaginal environment (including the vulva and urethra) including: Thinning of the epithelium (skin/ mucosa surface) Can contribute to urinary urgency and frequency Can contribute to dryness, itching, irritation of the vulvar and vaginal tissue Can contribute to pain with intercourse Can contribute to physical changes of the labia, vulva, and vagina such as: Narrowing of the vaginal opening Decreased vaginal length Loss of labial architecture Labial adhesions Pale color of vulvovaginal tissue Loss of pubic hair Allows bacteria to become adherent  Results in increased risk for urinary tract infection (UTI) due to bacterial overgrowth and migration up the urethra into the bladder Change in vaginal pH (acid/ base balance) Allows for alteration / disruption of the normal bacterial flora / microbiome Results in increased risk for urinary tract infection (UTI) due to bacterial overgrowth  Treatment options: Over-the-counter lubricants (see list below). Prescription vaginal estrogen replacement. Options: Topical vaginal estrogen cream Estrace, Premarin, or compounded estradiol cream/ gel We advise: Discard plastic applicator as that tends to use more medication than you need, which is not harmful but wastes / uses up the medication. Also the plastic applicator may cause discomfort. Insert blueberry size amount of medication via the tip of your finger inside vagina nightly for 1 week then 2-3 times per week (long term). Estring vaginal ring Exchanged every 3 months (either at home or in office by provider) Vagifem vaginal tablet Inserted nightly for 2 weeks then twice a week (long term) lntrarosa vaginal suppository Vaginal DHEA: converts to estrogen in vaginal tissue without systemic effect Inserted nightly (long term) Vaginal laser therapy (Mona Lisa touch) Performed in 3 treatments each 6 weeks  apart (available in our [place] office). Can feel like a sunburn for 3-4 days after each treatment until new skin heals in. Usually not covered by insurance. Estimated cost is $1500 for all 3 sessions.  FYI regarding prescription vaginal estrogen treatment options: All topical vaginal estrogen replacement options are equivalent in terms of efficacy. Topical vaginal estrogen replacement will take about 3 months to be effective. OK to have sex with any of the topical vaginal estrogen replacement options. Topical vaginal estrogen replacement may sting/burn initially due to severe dryness, which will improve with ongoing treatment. There have been studies that evaluate use of low-dose intravaginal estrogen that show minimal systemic absorption which is negligible after 3 weeks. There have been no studies indicating increased risk of contributing to cancer development or recurrence.  Topical vaginal estrogen cream safe to use with breast cancer history [email or web address]  Topical vaginal estrogen cream safe to use with blood clot history [email or web address]   Lubricants and Moisturizers for Treating Genitourinary Syndrome of Menopause and Vulvovaginal Atrophy Treatment Comments I Available Products   Lubricants   Water-based Ingredients: Deionized water, glycerin, propylene glycol; latex safe; rare irritation; dry out with extended sexual activity Astroglide, Good Clean Love, K-Y Jelly, Natural, Organic, Pink, Sliquid, Sylk, Yes    Oil Based Ingredients: avocado, olive, peanut, corn; latex safe; can be used with silicone products; staining; safe (unless peanut allergy); non-irritating Coconut oil, vegetable oil, vitamin E oil  Silicone-Based Ingredients:  Silicone polymers; staining; typically nonirritating, long lasting; waterproof; should not be used with silicone dilators, sexual toys, or gynecologic products Astroglide X, Oceanus Ultra Pure, Pink Silicone, Pjur Eros, Replens Silky Smooth, Silicone Premium JO, SKYN, Uberlube, [employer] Based Minimize harm to sperm motility; designed Astroglide TTC, Conceive Plus, Pre for couples trying to conceive Seed,  Yes Baby  Fertility Friendly Minimize harm to sperm motility; designed Astroglide, TTC, Conceive Plus, Pre for couples trying to conceive Seed, Yes Baby  Vaginal Moisturizers   Vaginal Moisturizers For maintenance use 1 to 3 times weekly; can benefit women with dryness, chafing with AOL, and recurrent vaginal infections irrespective of sexual activity timing Balance Active Menopause Vaginal Moisturizing Lubricant, Canesintima Intimate Moisturizer, Replens, Rephresh, Sylk Natural Intimate Moisturizer, Yes Vaginal Moisturizer  Hybrids Properties of both water and silicone-based products (combination of a vaginal lubricant and moisturizer); Non-irritating; good option for women with allergies and sensitivities Lubrigyn, Luvena  Suppositories Hyaluronic acid to retain moisture Revaree  Vulvar Soothing Creams/Oils    Medicated CreamsP ain and burn relief; Ingredients: 4% Lidocaine, Aloe Vera gel Releveum (Desert Correll)  Non-Medicated Creams For anti-itch and moisture/maintenance; Ingredients: Coconut oil, Avocado oil, Shea Butter, Olive oil, Vitamin E Vajuvenate, Vmagic  Oils !For moisture/maintenance !Coconut oil, Vitamin E oil, Emu oil                 Overactive bladder (OAB) overview for patients:  Symptoms may include: urinary urgency ("gotta go" feeling) urinary frequency (voiding >8 times per day) night time urination (nocturia) urge incontinence of urine (UUI)  While we do not know the exact etiology of OAB, several treatment options exist  including:  Behavioral therapy: Reducing fluid intake Decreasing bladder stimulants (such as caffeine) and irritants (such as acidic food, spicy foods, alcohol) Urge suppression strategies Bladder retraining via timed voiding  Pelvic floor physical therapy  Medication(s) - can use one or both of the drug classes below. Anticholinergic / antimuscarinic medications:  Mechanism of action: Activate M3 receptors to reduce detrusor stimulation and increase bladder capacity  (parasympathetic nervous system). Effect: Relaxes the bladder to decrease overactivity, increase bladder storage capacity, and increase time between voids. Onset: Slow acting (may take 8-12 weeks to determine efficacy). Medications include: Vesicare (Solifenacin), Ditropan (Oxybutynin), Detrol (Tolterodine), Toviaz (Fesoterodine), Sanctura (Trospium), Urispas (Flavoxate), Enablex (Darifenacin), Bentyl (Dicyclomine), Levsin (Hyoscyamine ). Potential side effects include but are not limited to: Dry eyes, dry mouth, constipation, cognitive impairment, dementia risk with long term use, and urinary retention/ incomplete bladder emptying. Insurance companies generally prefer for patients to try 1-2 anticholinergic / antimuscarinic medications first due to low cost. Some exceptions are made based on patient-specific comorbidities / risk factors. Beta-3 agonist medications: Mechanism of action: Stimulates selective B3 adrenergic receptors to cause smooth muscle bladder relaxation (sympathetic nervous system). Effect: Relaxes the bladder to decrease overactivity, increase bladder storage capacity, and increase time between voids. Onset: Slow acting (may take 8-12 weeks to determine efficacy). Medications include: Myrbetriq (Mirabegron) and Vibegron Leslye Peer). Potential side effects include but are not limited to: urinary retention / incomplete bladder emptying and elevated blood pressure (more likely to occur in individuals with  pre-existing uncontrolled hypertension). These medications tend to be more expensive than the anticholinergic / antimuscarinic medications.   For patients with refractory OAB (if the above treatment options have been unsuccessful): Posterior tibial nerve stimulation (PTNS). Small acupuncture-type needle inserted near ankle with electric current to stimulate bladder via posterior tibial nerve pathway. Initially requires 12 weekly in-office treatments lasting 30 minutes each; followed by monthly in-office treatments lasting 30 minutes each for 1 year.  Bladder Botox injections. How it is done: Typically done via in-office cystoscopy; sometimes done in the OR depending on the situation. The bladder is numbed with lidocaine instilled via a catheter. Then the urologist injects Botox into the bladder muscle wall in about 20  locations. Causes local paralysis of the bladder muscle at the injection sites to reduce bladder muscle overactivity / spasms. The effect lasts for approximately 6 months and cannot be reversed once performed. Risks may included but are not limited to: infection, incomplete bladder emptying/ urinary retention, short term need for self-catheterization or indwelling catheter, and need for repeat therapy. There is a 5-12% chance of needing to catheterize with Botox - that usually resolves in a few months as the Botox wears off. Typically Botox injections would need to be repeated every 3-12 months since this is not a permanent therapy.  Sacral neuromodulation trial (Medtronic lnterStim or Axonics implant). Sacral neuromodulation is FDA-approved for uncontrolled urinary urgency, urinary frequency, urinary urge incontinence, non-obstructive urinary retention, or fecal incontinence. It is not FDA-approved as a treatment for pain. The goal of this therapy is at least a 50% improvement in symptoms. It is NOT realistic to expect a 100% cure. This is a a 2-step outpatient procedure. After a  successful test period, a permanent wire and generator are placed in the OR. We discussed the risk of infection. We reviewed the fact that about 30% of patients fail the test phase and are not candidates for permanent generator placement. During the 1-2 week trial phase, symptoms are documented by the patient to determine response. If patient gets at least a 50% improvement in symptoms, they may then proceed with Step 2. Step 1: Trial lead placement. Per physician discretion, may done one of two ways: Percutaneous nerve evaluation (PNE) in the [facility] urology office. Performed by urologist under local anesthesia (numbing the area with lidocaine) using a spinal needle for placement of test wire, which usually stays in place for 5-7 days to determine therapy response. Test lead placement in OR under anesthesia. Usually stays in place 2 weeks to determine therapy response. > Step 2: Permanent implantation of sacral neuromodulation device, which is performed in the OR.  Sacral neuromodulation implants: All are conditionally MRI safe. Manufacturer: Medtronic Website: BuffaloDryCleaner.gl therapy/right-for-you.html Options: lnterStim X: Non-rechargeable. The battery lasts 10 years on average. lnterStim Micro: Rechargeable. The battery lasts 15 years on average and must be charged routinely. Approximately 50% smaller implant than lnterStim X implant.  Manufacturer: Axonics Website: Findrealrelief.axonics.com Options: Non-rechargeable (Axonics F15): The battery lasts 15 years on average. Rechargeable (Axonics R20): The battery lasts 20 years on average and must be charged in office for about 1 hour every 6-10 months on average. Approximately 50% smaller implant than Axonics non-rechargeable implant.  Note: Generally the rechargeable devices are only advised for very small or thin patients who may not have sufficient adipose tissue  to comfortably overlay the implanted device.  Suprapubic catheter (SP tube) placement. Only done in severely refractory OAB when all other options have failed or are not a viable treatment choice depending on patient factors. Involves placement of a catheter through the lower abdomen into the bladder to continuously drain the bladder into an external collection bag, which patient can then empty at their convenience every few hours. Done via an outpatient surgical procedure in the OR under anesthesia. Risks may included but are not limited to: surgical site pain, infections, skin irritation / breakdown, chronic bacteriuria, symptomatic UTls. The SP tube must stay in place continuously. This is a reversible procedure however - the insertion site will close if catheter is removed for more than a few hours. The SP tube must be exchanged routinely every 4 weeks to prevent the catheter from becoming clogged with sediment. SP tube exchanges  are typically performed at a urology nurse visit or by a home health nurse.

## 2023-01-05 ENCOUNTER — Other Ambulatory Visit: Payer: Self-pay | Admitting: Neurology

## 2023-01-31 ENCOUNTER — Emergency Department (HOSPITAL_COMMUNITY): Payer: Medicare Other

## 2023-01-31 ENCOUNTER — Encounter (HOSPITAL_COMMUNITY): Payer: Self-pay | Admitting: Emergency Medicine

## 2023-01-31 ENCOUNTER — Other Ambulatory Visit: Payer: Self-pay

## 2023-01-31 ENCOUNTER — Emergency Department (HOSPITAL_COMMUNITY)
Admission: EM | Admit: 2023-01-31 | Discharge: 2023-01-31 | Disposition: A | Discharge status: Home / Self Care | Payer: Medicare Other | Attending: Emergency Medicine | Admitting: Emergency Medicine

## 2023-01-31 DIAGNOSIS — I10 Essential (primary) hypertension: Secondary | ICD-10-CM | POA: Insufficient documentation

## 2023-01-31 DIAGNOSIS — M7989 Other specified soft tissue disorders: Secondary | ICD-10-CM | POA: Insufficient documentation

## 2023-01-31 DIAGNOSIS — E039 Hypothyroidism, unspecified: Secondary | ICD-10-CM | POA: Diagnosis not present

## 2023-01-31 DIAGNOSIS — K8020 Calculus of gallbladder without cholecystitis without obstruction: Secondary | ICD-10-CM | POA: Diagnosis not present

## 2023-01-31 DIAGNOSIS — L03115 Cellulitis of right lower limb: Secondary | ICD-10-CM | POA: Diagnosis not present

## 2023-01-31 DIAGNOSIS — M25551 Pain in right hip: Secondary | ICD-10-CM | POA: Diagnosis not present

## 2023-01-31 DIAGNOSIS — M79651 Pain in right thigh: Secondary | ICD-10-CM | POA: Diagnosis not present

## 2023-01-31 DIAGNOSIS — Z853 Personal history of malignant neoplasm of breast: Secondary | ICD-10-CM | POA: Diagnosis not present

## 2023-01-31 DIAGNOSIS — M25559 Pain in unspecified hip: Secondary | ICD-10-CM | POA: Diagnosis not present

## 2023-01-31 DIAGNOSIS — M160 Bilateral primary osteoarthritis of hip: Secondary | ICD-10-CM | POA: Diagnosis not present

## 2023-01-31 DIAGNOSIS — K828 Other specified diseases of gallbladder: Secondary | ICD-10-CM | POA: Diagnosis not present

## 2023-01-31 HISTORY — DX: Cerebral infarction, unspecified: I63.9

## 2023-01-31 LAB — BASIC METABOLIC PANEL
Anion gap: 10 (ref 5–15)
BUN: 13 mg/dL (ref 8–23)
CO2: 26 mmol/L (ref 22–32)
Calcium: 8.7 mg/dL — ABNORMAL LOW (ref 8.9–10.3)
Chloride: 104 mmol/L (ref 98–111)
Creatinine, Ser: 0.57 mg/dL (ref 0.44–1.00)
GFR, Estimated: >60 mL/min (ref >60)
Glucose, Bld: 99 mg/dL (ref 70–99)
Potassium: 3.6 mmol/L (ref 3.5–5.1)
Sodium: 140 mmol/L (ref 135–145)

## 2023-01-31 LAB — CBC
HCT: 35.8 % — ABNORMAL LOW (ref 36.0–46.0)
Hemoglobin: 11.6 g/dL — ABNORMAL LOW (ref 12.0–15.0)
MCH: 29.3 pg (ref 26.0–34.0)
MCHC: 32.4 g/dL (ref 30.0–36.0)
MCV: 90.4 fL (ref 80.0–100.0)
Platelets: 193 10*3/uL (ref 150–400)
RBC: 3.96 MIL/uL (ref 3.87–5.11)
RDW: 14.6 % (ref 11.5–15.5)
WBC: 6 10*3/uL (ref 4.0–10.5)
nRBC: 0 % (ref 0.0–0.2)

## 2023-01-31 MED ORDER — CEPHALEXIN 500 MG PO CAPS: 500.0000 mg | ORAL_CAPSULE | Freq: Four times a day (QID) | ORAL | 0 refills | Status: DC

## 2023-01-31 MED ORDER — IOHEXOL 300 MG/ML  SOLN
100.0000 mL | Freq: Once | INTRAMUSCULAR | Status: AC | PRN
  Administered 2023-01-31: 100 mL via INTRAVENOUS

## 2023-01-31 MED ORDER — FENTANYL CITRATE PF 50 MCG/ML IJ SOSY
50.0000 ug | PREFILLED_SYRINGE | Freq: Once | INTRAMUSCULAR | Status: AC
  Administered 2023-01-31: 50 ug via INTRAVENOUS
  Filled 2023-01-31: qty 1

## 2023-01-31 NOTE — ED Triage Notes (Addendum)
Pt with c/o R hip pain that woke her from her sleep. Denies injury. Pt also c/o R leg swelling.

## 2023-01-31 NOTE — ED Provider Notes (Signed)
  Physical Exam  BP (!) 170/62   Pulse 63   Temp 98.6 F (37 C) (Oral)   Resp 16   Ht 5\' 9"  (1.753 m)   Wt 70.8 kg   LMP  (LMP Unknown)   SpO2 96%   BMI 23.04 kg/m   Physical Exam  Procedures  Procedures  ED Course / MDM    Medical Decision Making Amount and/or Complexity of Data Reviewed Labs: ordered. Radiology: ordered. ECG/medicine tests: ordered.  Risk Prescription drug management.   Received in signout.  Swelling right lower extremity.  Does have erythema and induration distally.  Likely cellulitis.  Doppler negative.  Blood work reassuring.  CT scan done due to some buttock tenderness was also reassuring.  No fall.  Will treat with antibiotics and PCP follow-up.  Will discharge.       Benjiman Core, MD 01/31/23 1244

## 2023-01-31 NOTE — Discharge Instructions (Addendum)
The workup for your right hip was reassuring besides some arthritis but looks like you have an infection of your lower leg.  Take the antibiotics.  Motrin and Tylenol may help with the pain.  Follow-up with your doctor as needed.

## 2023-01-31 NOTE — ED Provider Notes (Signed)
[facility]-EMERGENCY DEPT [facility] Emergency Department Provider Note MRN:  [id number]  Arrival date & time: 01/31/23     Chief Complaint   Hip Pain   History of Present Illness   [name] is a 83 y.o. year-old female with a history of hypertension presenting to the ED with chief complaint of hip pain.  Right hip and right femur pain causing significant trouble ambulating this evening, trouble sleeping.  Has been having progressively worsening right leg swelling for the past week or 2 with some mild pain, much worse now.  No numbness or weakness to the face or arms, no numbness to the right leg, strength or function severely diminished by pain.  No recent trauma, no chest pain or shortness of breath.  Review of Systems  A thorough review of systems was obtained and all systems are negative except as noted in the HPI and PMH.   Patient's Health History    Past Medical History:  Diagnosis Date   Anxiety state, unspecified    Arthritis    Arthropathy, unspecified, site unspecified    Carpal tunnel syndrome    Bilateral   Cataract    Right 7 Left eye remved cateracts   Chronic airway obstruction, not elsewhere classified    patient denies on 02/27/14   Esophageal reflux    Esophageal stricture    Hypertension    Internal hemorrhoids without mention of complication    Malignant neoplasm of breast (female), unspecified site    left breast    Memory disorder 09/24/2013   Moderate obstructive sleep apnea 01/20/2021   Nocturnal leg cramps 06/29/2018   Osteopenia    Other and unspecified hyperlipidemia    Personal history of colonic polyps    adenomatous   Polyneuropathy in other diseases classified elsewhere (HCC) 09/24/2013   Shingles    Sleep apnea    no longer uses cpap   Stroke (HCC)    Tubular adenoma of colon    Unspecified hypothyroidism     Past Surgical History:  Procedure Laterality Date   APPENDECTOMY  03/2014   BACK SURGERY     X2   BREAST LUMPECTOMY      COLONOSCOPY  08/03/2018   EYE SURGERY     cataracts bilateral   LAPAROSCOPIC APPENDECTOMY N/A 02/28/2014   Procedure: APPENDECTOMY LAPAROSCOPIC PARTIAL CECECTOMY FOR MUCOCELE OF APPENDIX ;  Surgeon: Karie Soda, MD;  Location: [facility];  Service: General;  Laterality: N/A;   MASTECTOMY     left   POLYPECTOMY     THYROIDECTOMY     TUBAL LIGATION     UPPER GASTROINTESTINAL ENDOSCOPY      Family History  Problem Relation Age of Onset   Bipolar disorder Sister    Early death Mother    Early death Sister    Colon cancer Neg Hx    Esophageal cancer Neg Hx    Rectal cancer Neg Hx    Stomach cancer Neg Hx     Social History   Socioeconomic History   Marital status: Divorced    Spouse name: Not on file   Number of children: 2   Years of education: Not on file   Highest education level: Not on file  Occupational History   Occupation: Retired    Associate Professor: RETIRED  Tobacco Use   Smoking status: Never   Smokeless tobacco: Never  Vaping Use   Vaping status: Never Used  Substance and Sexual Activity   Alcohol use: No    Alcohol/week:  0.0 standard drinks of alcohol   Drug use: No   Sexual activity: Never    Birth control/protection: Post-menopausal  Other Topics Concern   Not on file  Social History Narrative   Patient lives at home alone and she is divorced.   Retired.   Education business course.   Right handed.   Caffeine sometimes tea.    Social Drivers of Corporate investment banker Strain: Low Risk  (12/05/2020)   Overall Financial Resource Strain (CARDIA)    Difficulty of Paying Living Expenses: Not very hard  Food Insecurity: No Food Insecurity (07/02/2021)   Received from [facility], [facility]   Hunger Vital Sign    Worried About Running Out of Food in the Last Year: Never true    Ran Out of Food in the Last Year: Never true  Transportation Needs: No Transportation Needs (12/05/2020)   PRAPARE - Administrator, Civil Service (Medical): No     Lack of Transportation (Non-Medical): No  Physical Activity: Insufficiently Active (09/21/2017)   Exercise Vital Sign    Days of Exercise per Week: 4 days    Minutes of Exercise per Session: 20 min  Stress: No Stress Concern Present (09/21/2017)   [employer] of Occupational Health - Occupational Stress Questionnaire    Feeling of Stress : Not at all  Social Connections: Unknown (05/15/2021)   Received from [facility], [facility]   Social Network    Social Network: Not on file  Intimate Partner Violence: Unknown (04/07/2021)   Received from [facility]   HITS    Physically Hurt: Not on file    Insult or Talk Down To: Not on file    Threaten Physical Harm: Not on file    Scream or Curse: Not on file     Physical Exam   Vitals:   01/31/23 0608  BP: (!) 149/68  Pulse: 76  Resp: 18  Temp: 98.2 F (36.8 C)  SpO2: 95%    CONSTITUTIONAL: Well-appearing, NAD NEURO/PSYCH:  Alert and oriented x 3, no focal deficits EYES:  eyes equal and reactive ENT/NECK:  no LAD, no JVD CARDIO: Regular rate, well-perfused, normal S1 and S2 PULM:  CTAB no wheezing or rhonchi GI/GU:  non-distended, non-tender MSK/SPINE:  No gross deformities, no edema SKIN: Prominent unilateral right leg swelling, neurovascularly intact, range of motion intact   *Additional and/or pertinent findings included in MDM below  Diagnostic and Interventional Summary    EKG Interpretation Date/Time:    Ventricular Rate:    PR Interval:    QRS Duration:    QT Interval:    QTC Calculation:   R Axis:      Text Interpretation:         Labs Reviewed  CBC  BASIC METABOLIC PANEL    DG Hip Unilat W or Wo Pelvis 2-3 Views Right    (Results Pending)  DG FEMUR, MIN 2 VIEWS RIGHT    (Results Pending)  US Venous Img Lower Unilateral Right    (Results Pending)    Medications - No data to display   Procedures  /  Critical Care Procedures  ED Course and Medical Decision Making  Initial  Impression and Ddx High suspicion for DVT, reassuring vascular exam, doubt phlegmasia cerulea dolens.  Patient is not having pain while at rest, only with movement.  Other consideration includes pathologic fracture, some type of lymphatic obstruction within the pelvis such as lymphoma.  Doubt infectious cause based  on exam, no fever.  Past medical/surgical history that increases complexity of ED encounter: Hypertension  Interpretation of Diagnostics Labs, x-rays, ultrasound pending  Patient Reassessment and Ultimate Disposition/Management     Signed out to oncoming provider at shift change.  Patient management required discussion with the following services or consulting groups:  None  Complexity of Problems Addressed Acute illness or injury that poses threat of life of bodily function  Additional Data Reviewed and Analyzed Further history obtained from: Further history from spouse/family member  Additional Factors Impacting ED Encounter Risk Consideration of hospitalization  Elmer Sow. Pilar Plate, MD [facility] Emergency Medicine [facility] [email or web address]  Final Clinical Impressions(s) / ED Diagnoses     ICD-10-CM   1. Right leg swelling  M79.89       ED Discharge Orders     None        Discharge Instructions Discussed with and Provided to Patient:   Discharge Instructions   None      Sabas Sous, MD 01/31/23 [phone]

## 2023-02-03 ENCOUNTER — Other Ambulatory Visit: Payer: Self-pay | Admitting: Neurology

## 2023-02-08 ENCOUNTER — Telehealth: Payer: Self-pay

## 2023-02-08 NOTE — Progress Notes (Signed)
 Transition Care Management Unsuccessful Follow-up Telephone Call  Date of discharge and from where:  01/31/2023 [facility]  Attempts:  1st Attempt  Reason for unsuccessful TCM follow-up call:  No answer/busy  [name] [facility]  [facility] Guide Direct Dial: [phone]  Fax: [phone] Website: [email or web address]

## 2023-02-09 ENCOUNTER — Telehealth: Payer: Self-pay

## 2023-02-09 NOTE — Progress Notes (Signed)
 Transition Care Management Unsuccessful Follow-up Telephone Call  Date of discharge and from where:  01/31/2023 [facility]  Attempts:  2nd Attempt  Reason for unsuccessful TCM follow-up call:  No answer/busy   Kahil Agner [facility]  [facility] Guide Direct Dial: [phone]  Fax: [phone] Website: [email or web address]

## 2023-02-18 DIAGNOSIS — M1711 Unilateral primary osteoarthritis, right knee: Secondary | ICD-10-CM | POA: Diagnosis not present

## 2023-02-18 DIAGNOSIS — R600 Localized edema: Secondary | ICD-10-CM | POA: Diagnosis not present

## 2023-02-18 DIAGNOSIS — R52 Pain, unspecified: Secondary | ICD-10-CM | POA: Diagnosis not present

## 2023-03-01 DIAGNOSIS — R52 Pain, unspecified: Secondary | ICD-10-CM | POA: Diagnosis not present

## 2023-03-01 DIAGNOSIS — M79604 Pain in right leg: Secondary | ICD-10-CM | POA: Diagnosis not present

## 2023-03-01 DIAGNOSIS — M7989 Other specified soft tissue disorders: Secondary | ICD-10-CM | POA: Diagnosis not present

## 2023-03-02 ENCOUNTER — Ambulatory Visit (INDEPENDENT_AMBULATORY_CARE_PROVIDER_SITE_OTHER): Payer: Medicare Other | Admitting: Family Medicine

## 2023-03-02 ENCOUNTER — Other Ambulatory Visit: Payer: Self-pay | Admitting: Family Medicine

## 2023-03-02 ENCOUNTER — Encounter: Payer: Self-pay | Admitting: Family Medicine

## 2023-03-02 VITALS — BP 142/63 | Temp 98.0°F | Ht 69.0 in | Wt 163.0 lb

## 2023-03-02 DIAGNOSIS — E7800 Pure hypercholesterolemia, unspecified: Secondary | ICD-10-CM | POA: Diagnosis not present

## 2023-03-02 DIAGNOSIS — J439 Emphysema, unspecified: Secondary | ICD-10-CM | POA: Diagnosis not present

## 2023-03-02 DIAGNOSIS — I10 Essential (primary) hypertension: Secondary | ICD-10-CM

## 2023-03-02 DIAGNOSIS — F411 Generalized anxiety disorder: Secondary | ICD-10-CM

## 2023-03-02 DIAGNOSIS — J449 Chronic obstructive pulmonary disease, unspecified: Secondary | ICD-10-CM

## 2023-03-02 DIAGNOSIS — E890 Postprocedural hypothyroidism: Secondary | ICD-10-CM

## 2023-03-02 LAB — LIPID PANEL

## 2023-03-02 MED ORDER — BUSPIRONE HCL 30 MG PO TABS
15.0000 mg | ORAL_TABLET | Freq: Every day | ORAL | 3 refills | Status: DC | PRN
Start: 2023-03-02 — End: 2023-03-02

## 2023-03-02 MED ORDER — BUSPIRONE HCL 30 MG PO TABS: 15.0000 mg | ORAL_TABLET | Freq: Every day | ORAL | 3 refills | Status: AC | PRN

## 2023-03-02 NOTE — Telephone Encounter (Signed)
 Copied from CRM [id number]. Topic: Clinical - Medication Refill >> Mar 02, 2023 11:44 AM Kristie Cowman wrote: Most Recent Primary Care Visit:  Provider: [facility]  Department: [facility]  Visit Type: OFFICE VISIT  Date: 03/02/2023  Medication: Buspar  Has the patient contacted their pharmacy? Yes (Agent: If no, request that the patient contact the pharmacy for the refill. If patient does not wish to contact the pharmacy document the reason why and proceed with request.) (Agent: If yes, when and what did the pharmacy advise?)  Call physicians office.  Is this the correct pharmacy for this prescription? Yes If no, delete pharmacy and type the correct one.  This is the patient's preferred pharmacy:  [facility], [place] - [address] [address] Phone: [phone] Fax: [phone]  [id number] - [place], [place] - [address] [place] [address] Phone: [phone] Fax: [phone]   Has the prescription been filled recently? No  Is the patient out of the medication? Yes  Has the patient been seen for an appointment in the last year OR does the patient have an upcoming appointment? Yes  Can we respond through MyChart? No  Agent: Please be advised that Rx refills may take up to 3 business days. We ask that you follow-up with your pharmacy.

## 2023-03-02 NOTE — Progress Notes (Signed)
 BP (!) 142/63   Temp 98 F (36.7 C)   Ht 5\' 9"  (1.753 m)   Wt 163 lb (73.9 kg)   LMP  (LMP Unknown)   SpO2 98%   BMI 24.07 kg/m    Subjective:   Patient ID: [name], female    DOB: 06/02/1940, 83 y.o.   MRN: [id number]  HPI: [name] is a 83 y.o. female presenting on 03/02/2023 for Medical Management of Chronic Issues, Hypothyroidism, and Hypertension   HPI Hypertension Patient is currently on lisinopril, and their blood pressure today is 142/63. Patient denies any lightheadedness or dizziness. Patient denies headaches, blurred vision, chest pains, shortness of breath, or weakness. Denies any side effects from medication and is content with current medication.   Hypothyroidism recheck Patient is coming in for thyroid recheck today as well. They deny any issues with hair changes or heat or cold problems or diarrhea or constipation. They deny any chest pain or palpitations. They are currently on levothyroxine 150 micrograms   Hyperlipidemia Patient is coming in for recheck of his hyperlipidemia. The patient is currently taking fish oils and Zetia. They deny any issues with myalgias or history of liver damage from it. They deny any focal numbness or weakness or chest pain.   Anxiety recheck Patient has used buspirone for quite some time for anxiety and she uses it occasionally but not all the time and wants a refill for it.  She says it has been sometime since she had a refill.  She denies any major depression or suicidal ideations.  COPD Patient is coming in for COPD recheck today.  He is currently on albuterol and fluticasone.  He has a mild chronic cough but denies any major coughing spells or wheezing spells.  He has 0 nighttime symptoms per week and 0 daytime symptoms per week currently.   Relevant past medical, surgical, family and social history reviewed and updated as indicated. Interim medical history since our last visit reviewed. Allergies and medications reviewed  and updated.  Review of Systems  Constitutional:  Negative for chills and fever.  Eyes:  Negative for visual disturbance.  Respiratory:  Negative for chest tightness and shortness of breath.   Cardiovascular:  Negative for chest pain and leg swelling.  Genitourinary:  Negative for difficulty urinating and dysuria.  Musculoskeletal:  Positive for arthralgias.  Skin:  Negative for rash.  Neurological:  Negative for dizziness, light-headedness and headaches.  Psychiatric/Behavioral:  Negative for agitation and behavioral problems.   All other systems reviewed and are negative.   Per HPI unless specifically indicated above   Allergies as of 03/02/2023       Reactions   Atorvastatin Other (See Comments)   Muscle cramps   Meperidine Hcl Nausea And Vomiting   Makes me 'deathly' sick   Statins Other (See Comments)   REACTION: Patient cannot tolerate due cramps   Norco [hydrocodone-acetaminophen] Nausea And Vomiting   Sulfonamide Derivatives Other (See Comments)   Unknown reaction        Medication List        Accurate as of March 02, 2023  9:00 AM. If you have any questions, ask your nurse or doctor.          STOP taking these medications    cephALEXin 500 MG capsule Commonly known as: KEFLEX Stopped by: Elige Radon Mataya Kilduff   gabapentin 300 MG capsule Commonly known as: NEURONTIN Stopped by: Elige Radon Soha Thorup       TAKE these medications  albuterol 108 (90 Base) MCG/ACT inhaler Commonly known as: VENTOLIN HFA Inhale 2 puffs into the lungs every 6 (six) hours as needed for wheezing or shortness of breath.   aspirin 81 MG tablet Take 81 mg by mouth daily.   busPIRone 30 MG tablet Commonly known as: BUSPAR Take 0.5-1 tablets (15-30 mg total) by mouth daily as needed. TAKE (1/2) TO (1) TABLET BY MOUTH ONCE DAILY AS NEEDED   butalbital-acetaminophen-caffeine 50-325-40 MG tablet Commonly known as: FIORICET Take 1 tab at onset of migraine.  May repeat in 2  hrs, if needed.  Max dose: 2 tabs/day. This is a 30 day prescription.   Calcium Carbonate-Vitamin D 500-125 MG-UNIT Tabs Take 1 capsule by mouth daily.   dimenhyDRINATE 50 MG tablet Commonly known as: DRAMAMINE Take 50 mg by mouth every 8 (eight) hours as needed.   donepezil 10 MG tablet Commonly known as: ARICEPT TAKE ONE TABLET BY MOUTH AT BEDTIME.   estradiol 0.1 MG/GM vaginal cream Commonly known as: ESTRACE Discard plastic applicator. Insert a blueberry size amount (approximately 1 gram) of cream on fingertip inside vagina at bedtime every night for 1 week then every other night. For long term use.   ezetimibe 10 MG tablet Commonly known as: ZETIA Take 1 tablet (10 mg total) by mouth daily.   Fish Oil 1000 MG Caps Take 1,000 mg by mouth daily.   fluticasone 50 MCG/ACT nasal spray Commonly known as: FLONASE Place 2 sprays into both nostrils daily.   Gemtesa 75 MG Tabs Generic drug: Vibegron Take 1 tablet (75 mg total) by mouth daily.   ibuprofen 200 MG tablet Commonly known as: ADVIL Take 800 mg by mouth every 6 (six) hours as needed (Pain).   lamoTRIgine 25 MG tablet Commonly known as: LAMICTAL Take 2 tablets (50 mg total) by mouth 2 (two) times daily. 1 tab twice a day x one week 2 tabs twice a day   levothyroxine 150 MCG tablet Commonly known as: SYNTHROID Take 1 tablet (150 mcg total) by mouth daily.   lisinopril 2.5 MG tablet Commonly known as: ZESTRIL Take 1 tablet (2.5 mg total) by mouth daily.   multivitamin capsule Take 1 capsule by mouth daily.   VITAMIN B 12 PO Take 1 tablet by mouth daily.         Objective:   BP (!) 142/63   Temp 98 F (36.7 C)   Ht 5\' 9"  (1.753 m)   Wt 163 lb (73.9 kg)   LMP  (LMP Unknown)   SpO2 98%   BMI 24.07 kg/m   Wt Readings from Last 3 Encounters:  03/02/23 163 lb (73.9 kg)  01/31/23 156 lb (70.8 kg)  10/12/22 157 lb (71.2 kg)    Physical Exam Vitals and nursing note reviewed.  Constitutional:       General: She is not in acute distress.    Appearance: She is well-developed. She is not diaphoretic.  Eyes:     Conjunctiva/sclera: Conjunctivae normal.  Cardiovascular:     Rate and Rhythm: Normal rate and regular rhythm.     Heart sounds: Normal heart sounds. No murmur heard. Pulmonary:     Effort: Pulmonary effort is normal. No respiratory distress.     Breath sounds: Normal breath sounds. No wheezing.  Musculoskeletal:        General: No tenderness. Normal range of motion.  Skin:    General: Skin is warm and dry.     Findings: No rash.  Neurological:  Mental Status: She is alert and oriented to person, place, and time.     Coordination: Coordination normal.  Psychiatric:        Behavior: Behavior normal.       Assessment & Plan:   Problem List Items Addressed This Visit       Cardiovascular and Mediastinum   HTN (hypertension)   Relevant Orders   CMP14+EGFR     Respiratory   COPD (chronic obstructive pulmonary disease) (HCC) - Primary   Relevant Orders   CBC with Differential/Platelet     Endocrine   Hypothyroidism, postsurgical   Relevant Orders   TSH     Other   Hyperlipidemia   Relevant Orders   Lipid panel   Anxiety state   Relevant Medications   busPIRone (BUSPAR) 30 MG tablet    Will send refill for buspirone.  She is going to work on tapering herself off of gabapentin by taking only 1 twice a day for a week and a half and then coming off of it Follow up plan: Return in about 6 months (around 08/30/2023), or if symptoms worsen or fail to improve, for Hypertension and thyroid and hld.  Counseling provided for all of the vaccine components Orders Placed This Encounter  Procedures   CBC with Differential/Platelet   CMP14+EGFR   Lipid panel   TSH    Arville Care, MD [facility] 03/02/2023, 9:00 AM

## 2023-03-03 LAB — CMP14+EGFR
ALT: 13 IU/L (ref 0–32)
AST: 19 IU/L (ref 0–40)
Albumin: 4.4 g/dL (ref 3.7–4.7)
Alkaline Phosphatase: 69 IU/L (ref 44–121)
BUN/Creatinine Ratio: 25 (ref 12–28)
BUN: 16 mg/dL (ref 8–27)
Bilirubin Total: 0.5 mg/dL (ref 0.0–1.2)
CO2: 25 mmol/L (ref 20–29)
Calcium: 9.2 mg/dL (ref 8.7–10.3)
Chloride: 100 mmol/L (ref 96–106)
Creatinine, Ser: 0.64 mg/dL (ref 0.57–1.00)
Globulin, Total: 2 g/dL (ref 1.5–4.5)
Glucose: 93 mg/dL (ref 70–99)
Potassium: 4.4 mmol/L (ref 3.5–5.2)
Sodium: 144 mmol/L (ref 134–144)
Total Protein: 6.4 g/dL (ref 6.0–8.5)
eGFR: 89 mL/min/{1.73_m2} (ref >59)

## 2023-03-03 LAB — LIPID PANEL
Cholesterol, Total: 236 mg/dL — ABNORMAL HIGH (ref 100–199)
HDL: 63 mg/dL (ref >39)
LDL CALC COMMENT:: 3.7 ratio (ref 0.0–4.4)
LDL Chol Calc (NIH): 156 mg/dL — ABNORMAL HIGH (ref 0–99)
Triglycerides: 96 mg/dL (ref 0–149)
VLDL Cholesterol Cal: 17 mg/dL (ref 5–40)

## 2023-03-03 LAB — CBC WITH DIFFERENTIAL/PLATELET
Basophils Absolute: 0 10*3/uL (ref 0.0–0.2)
Basos: 0 %
EOS (ABSOLUTE): 0.2 10*3/uL (ref 0.0–0.4)
Eos: 3 %
Hematocrit: 39.4 % (ref 34.0–46.6)
Hemoglobin: 12.7 g/dL (ref 11.1–15.9)
Immature Grans (Abs): 0 10*3/uL (ref 0.0–0.1)
Immature Granulocytes: 0 %
Lymphocytes Absolute: 1.5 10*3/uL (ref 0.7–3.1)
Lymphs: 28 %
MCH: 28.8 pg (ref 26.6–33.0)
MCHC: 32.2 g/dL (ref 31.5–35.7)
MCV: 89 fL (ref 79–97)
Monocytes Absolute: 0.4 10*3/uL (ref 0.1–0.9)
Monocytes: 7 %
Neutrophils Absolute: 3.2 10*3/uL (ref 1.4–7.0)
Neutrophils: 62 %
Platelets: 231 10*3/uL (ref 150–450)
RBC: 4.41 x10E6/uL (ref 3.77–5.28)
RDW: 13.6 % (ref 11.7–15.4)
WBC: 5.2 10*3/uL (ref 3.4–10.8)

## 2023-03-03 LAB — TSH: TSH: 8.47 u[IU]/mL — ABNORMAL HIGH (ref 0.450–4.500)

## 2023-03-10 ENCOUNTER — Encounter: Payer: Self-pay | Admitting: Family Medicine

## 2023-03-10 ENCOUNTER — Telehealth: Payer: Self-pay

## 2023-03-10 MED ORDER — LEVOTHYROXINE SODIUM 175 MCG PO TABS: 175.0000 ug | ORAL_TABLET | Freq: Every day | ORAL | 1 refills | Status: DC

## 2023-03-10 NOTE — Telephone Encounter (Signed)
 Reviewed labs with pt. States that she takes her Zetia and thyroid medication daily. New thyroid strength sent to [facility] pharmacy. Pt made aware.

## 2023-03-10 NOTE — Addendum Note (Signed)
 Addended by: Dorene Sorrow on: 03/10/2023 04:56 PM   Modules accepted: Orders

## 2023-03-10 NOTE — Telephone Encounter (Signed)
 Please call the patient and ask her what her additional questions are please

## 2023-03-10 NOTE — Telephone Encounter (Signed)
 Copied from CRM [id number]. Topic: General - Call Back - No Documentation >> Mar 10, 2023 10:04 AM Gaetano Hawthorne wrote: Reason for CRM: Patient is calling back regarding lab results - I partially relayed the results/message from Dr. Louanne Skye, however, the patient has some questions about her LDL levels and the Zetia medication - Please review the following message with the patient and answer her additional questions:  Patient's LDL which is the bad cholesterol is still elevated. Her thyroid is also slightly on the low side. Hemoglobin and platelets look good. Kidney function and liver function looks good. Please continue to take Zetia, because she could not take a statin and I did not know that I have anything else to add for the cholesterol except focus on diet. Please increase her levothyroxine to 172 mcg daily and give her 6 months worth.

## 2023-03-17 DIAGNOSIS — R498 Other voice and resonance disorders: Secondary | ICD-10-CM | POA: Diagnosis not present

## 2023-03-17 DIAGNOSIS — H903 Sensorineural hearing loss, bilateral: Secondary | ICD-10-CM | POA: Diagnosis not present

## 2023-04-09 ENCOUNTER — Other Ambulatory Visit: Payer: Self-pay | Admitting: Neurology

## 2023-04-13 DIAGNOSIS — R519 Headache, unspecified: Secondary | ICD-10-CM | POA: Diagnosis not present

## 2023-04-13 DIAGNOSIS — R9082 White matter disease, unspecified: Secondary | ICD-10-CM | POA: Diagnosis not present

## 2023-04-13 DIAGNOSIS — H903 Sensorineural hearing loss, bilateral: Secondary | ICD-10-CM | POA: Diagnosis not present

## 2023-04-18 DIAGNOSIS — M7989 Other specified soft tissue disorders: Secondary | ICD-10-CM | POA: Diagnosis not present

## 2023-05-18 NOTE — Progress Notes (Unsigned)
 ASSESSMENT AND PLAN 83 y.o. year old female     Chronic Headaches -Improved, daily, but only 1-2 severe a month  -Continue Lamictal  50 mg twice daily -Continue Fioricet PRN for severe headache  -Limit Tylenol  use, try not to treat headache more than 2-3 days a week to prevent rebound headache  -I do think not using CPAP is contributing to headaches   Memory Loss -Reported memory loss for approximately 4 years.  -MRI shows brain atrophy, superficial cytosis, old stroke -MMSE 30/30 -Continue Aricept  10 mg daily   Gait Instability and Falls - Does not want to pursue physical therapy -Multifactorial, right knee pain, starts in low back  - Follow-up with orthopedist, vascular -Having follow-up ultrasound this week -Dr. Gracie Lav ordered x-ray cervical, pelvis, lumbar spine last visit, showed degenerative changes  Urinary Incontinence - Chronic issue, follows with urology, better with Gemtesa   Sleep Apnea -Noncompliant, advised follow-up with cardiology since Dr. Loetta Ringer was previously managing. I think many of the above complaints can be improved with CPAP use.  -2023 had HST - Moderate obstructive sleep apnea occurred during this study (AHI 28.7/h). There is a significant positional component with supine sleep (421.8 mintes) AHI 32.2/h versus non-supine sleep (56.1 minutes) AHI 1.1/h. - Severe oxygen desaturation to a nadir of 73%. Time spent < 73 minutes was 34.1 minutes. - Patient snored for 112.2 minutes (23.8%) during the sleep  Follow-up with me in 6 months or sooner if needed.  DIAGNOSTIC DATA (LABS, IMAGING, TESTING) - I reviewed patient records, labs, notes, testing and imaging myself where available.    HISTORY OF PRESENT ILLNESS: Discussed the use of AI scribe software for clinical note transcription with the patient, who gave verbal consent to proceed.  The patient, with a past medical history of breast cancer, thyroid  issues, and hypertension, presents with multiple  complaints. The chief complaint is memory loss, which she has been experiencing for about three to four years. She reports no family history of memory loss. The patient also reports frequent falls, which have increased in the past year. She has hit her head several times during these falls.  The patient also complains of daily headaches, which she describes as a pain above her eyes. The pain can range from a 6 to a 10 on a scale of 0 to 10. She has been taking over-the-counter Tylenol  to manage the pain, often exceeding the recommended dosage.  She also reports leg and back pain, which she describes as terrible. The pain in her legs has been affecting her walking and balance. She also reports bladder control issues, which have become desperate. She has to change her clothes four to five times a day due to urinary incontinence.  The patient lives alone and is quite active. She works in the yard and attends exercise classes at her church twice a week. However, her physical condition has been deteriorating, affecting her daily activities. She reports a decrease in hand strength, which has affected her ability to do tasks like rolling her hair or opening a can.   Update May 19, 2023 SS: EEG showed mild background slowing.  X-ray cervical spine, pelvis, lumbar showed degenerative changes. Here with her daughter. Dr. Gracie Lav started her on Lamictal  50 mg twice daily for headaches:  Headaches are better. Will take Fioricet for severe headache, 1-2 times a month. Mostly daily headache. Often wakes with headache in AM. Takes Tylenol  with good benefit. Right sided headache, sometimes nausea.  Memory MMSE 30/30. Lives alone,  drives very short distances. Manages her lawn and household. Daughter is more involved.drove her lawnmower into the fence when it was wet.   Falls: being very careful, her left leg is shorter. Few weeks ago fell in the yard.   CPAP not using CPAP, cardiology Dr. Loetta Ringer was using. Made her feel  like suffocating.   Has pain to right leg, has had injection to the right knee. Starts in low back. Went to ER in Jan, Vascular in Feb. Right knee is sore.   Sees urology for urinary incontinence. Better on Gemtesa .    PHYSICAL EXAM  Vitals:   10/12/22 0914 10/12/22 0921  BP: (!) 170/68 (!) 175/68  Pulse: 78 71  Weight: 157 lb (71.2 kg)   Height: 5\' 9"  (1.753 m)    Body mass index is 23.18 kg/m.    10/12/2022    9:19 AM 11/18/2021    9:35 AM 05/06/2021    9:06 AM  MMSE - Mini Mental State Exam  Orientation to time 4 5 4   Orientation to Place 5 5 5   Registration 3 3 3   Attention/ Calculation 4 4 5   Recall 3 3 3   Language- name 2 objects 2 2 2   Language- repeat 1 1 1   Language- follow 3 step command 3 3 3   Language- read & follow direction 1 1 1   Write a sentence 1 1 1   Copy design 1 1 1   Total score 28 29 29        No data to display         Generalized: Elderly female, awake, alert oriented to history taking and casual conversation conversation, looks tired today  Neurological examination  Mentation: Alert oriented to time, place, history taking. Follows all commands speech and language fluent Cranial nerve II-XII: Pupils were equal round reactive to light. Extraocular movements were full, visual field were full on confrontational test. Facial sensation and strength were normal.  Head turning and shoulder shrug  were normal and symmetric.  Motor: No significant limb muscle weakness, Sensory: Intact to light touch bilaterally Coordination: No truncal ataxia or limb dysmetria noted, Gait and station: Push-up to get up from seated position, cautious, Reflexes: Deep tendon reflexes are present and symmetric      REVIEW OF SYSTEMS: Out of a complete 14 system review of symptoms, the patient complains only of the following symptoms, and all other reviewed systems are negative.    ALLERGIES: Allergies  Allergen Reactions   Atorvastatin Other (See Comments)    Muscle  cramps   Meperidine Hcl Nausea And Vomiting    Makes me 'deathly' sick   Statins Other (See Comments)    REACTION: Patient cannot tolerate due cramps   Norco [Hydrocodone -Acetaminophen ] Nausea And Vomiting   Sulfonamide Derivatives Other (See Comments)    Unknown reaction    HOME MEDICATIONS: Outpatient Medications Prior to Visit  Medication Sig Dispense Refill   albuterol  (VENTOLIN  HFA) 108 (90 Base) MCG/ACT inhaler Inhale 2 puffs into the lungs every 6 (six) hours as needed for wheezing or shortness of breath. 8 g 0   aspirin 81 MG tablet Take 81 mg by mouth daily.     busPIRone  (BUSPAR ) 30 MG tablet Take 0.5-1 tablets (15-30 mg total) by mouth daily as needed. TAKE (1/2) TO (1) TABLET BY MOUTH ONCE DAILY AS NEEDED 90 tablet 3   butalbital -acetaminophen -caffeine  (FIORICET) 50-325-40 MG tablet Take 1 tab at onset of migraine.  May repeat in 2 hrs, if needed.  Max dose: 2  tabs/day. This is a 30 day prescription. 12 tablet 5   Calcium Carbonate-Vitamin D  500-125 MG-UNIT TABS Take 1 capsule by mouth daily.     Cyanocobalamin  (VITAMIN B 12 PO) Take 1 tablet by mouth daily.      dimenhyDRINATE (DRAMAMINE) 50 MG tablet Take 50 mg by mouth every 8 (eight) hours as needed.     donepezil  (ARICEPT ) 10 MG tablet TAKE ONE TABLET BY MOUTH AT BEDTIME. 90 tablet 3   estradiol  (ESTRACE ) 0.1 MG/GM vaginal cream Discard plastic applicator. Insert a blueberry size amount (approximately 1 gram) of cream on fingertip inside vagina at bedtime every night for 1 week then every other night. For long term use. 30 g 3   ezetimibe  (ZETIA ) 10 MG tablet Take 1 tablet (10 mg total) by mouth daily. 90 tablet 3   fluticasone  (FLONASE ) 50 MCG/ACT nasal spray Place 2 sprays into both nostrils daily. 16 g 6   ibuprofen  (ADVIL ,MOTRIN ) 200 MG tablet Take 800 mg by mouth every 6 (six) hours as needed (Pain).      lamoTRIgine  (LAMICTAL ) 25 MG tablet TAKE ONE TABLET BY MOUTH TWICE DAILY FOR ONE WEEK, THEN TWO TABLETS TWICE DAILY.  120 tablet 5   levothyroxine  (SYNTHROID ) 175 MCG tablet Take 1 tablet (175 mcg total) by mouth daily. 90 tablet 1   lisinopril  (ZESTRIL ) 2.5 MG tablet Take 1 tablet (2.5 mg total) by mouth daily. 90 tablet 3   Multiple Vitamin (MULTIVITAMIN) capsule Take 1 capsule by mouth daily.     Omega-3 Fatty Acids (FISH OIL) 1000 MG CAPS Take 1,000 mg by mouth daily.     Vibegron  (GEMTESA ) 75 MG TABS Take 1 tablet (75 mg total) by mouth daily. 90 tablet 3   No facility-administered medications prior to visit.    PAST MEDICAL HISTORY: Past Medical History:  Diagnosis Date   Anxiety state, unspecified    Arthritis    Arthropathy, unspecified, site unspecified    Carpal tunnel syndrome    Bilateral   Cataract    Right 7 Left eye remved cateracts   Chronic airway obstruction, not elsewhere classified    patient denies on 02/27/14   Esophageal reflux    Esophageal stricture    Hypertension    Internal hemorrhoids without mention of complication    Malignant neoplasm of breast (female), unspecified site    left breast    Memory disorder 09/24/2013   Moderate obstructive sleep apnea 01/20/2021   Nocturnal leg cramps 06/29/2018   Osteopenia    Other and unspecified hyperlipidemia    Personal history of colonic polyps    adenomatous   Polyneuropathy in other diseases classified elsewhere (HCC) 09/24/2013   Shingles    Sleep apnea    no longer uses cpap   Stroke (HCC)    Tubular adenoma of colon    Unspecified hypothyroidism     PAST SURGICAL HISTORY: Past Surgical History:  Procedure Laterality Date   APPENDECTOMY  03/2014   BACK SURGERY     X2   BREAST LUMPECTOMY     COLONOSCOPY  08/03/2018   EYE SURGERY     cataracts bilateral   LAPAROSCOPIC APPENDECTOMY N/A 02/28/2014   Procedure: APPENDECTOMY LAPAROSCOPIC PARTIAL CECECTOMY FOR MUCOCELE OF APPENDIX ;  Surgeon: Candyce Champagne, MD;  Location: [facility];  Service: General;  Laterality: N/A;   MASTECTOMY     left   POLYPECTOMY      THYROIDECTOMY     TUBAL LIGATION     UPPER GASTROINTESTINAL ENDOSCOPY  FAMILY HISTORY: Family History  Problem Relation Age of Onset   Bipolar disorder Sister    Early death Mother    Early death Sister    Colon cancer Neg Hx    Esophageal cancer Neg Hx    Rectal cancer Neg Hx    Stomach cancer Neg Hx     SOCIAL HISTORY: Social History   Socioeconomic History   Marital status: Divorced    Spouse name: Not on file   Number of children: 2   Years of education: Not on file   Highest education level: Not on file  Occupational History   Occupation: Retired    Associate Professor: RETIRED  Tobacco Use   Smoking status: Never   Smokeless tobacco: Never  Vaping Use   Vaping status: Never Used  Substance and Sexual Activity   Alcohol use: No    Alcohol/week: 0.0 standard drinks of alcohol   Drug use: No   Sexual activity: Never    Birth control/protection: Post-menopausal  Other Topics Concern   Not on file  Social History Narrative   Patient lives at home alone and she is divorced.   Retired.   Education business course.   Right handed.   Caffeine  sometimes tea.    Social Drivers of Corporate investment banker Strain: Low Risk  (12/05/2020)   Overall Financial Resource Strain (CARDIA)    Difficulty of Paying Living Expenses: Not very hard  Food Insecurity: No Food Insecurity (07/02/2021)   Received from [facility], [facility]   Hunger Vital Sign    Worried About Running Out of Food in the Last Year: Never true    Ran Out of Food in the Last Year: Never true  Transportation Needs: No Transportation Needs (12/05/2020)   PRAPARE - Administrator, Civil Service (Medical): No    Lack of Transportation (Non-Medical): No  Physical Activity: Insufficiently Active (09/21/2017)   Exercise Vital Sign    Days of Exercise per Week: 4 days    Minutes of Exercise per Session: 20 min  Stress: No Stress Concern Present (09/21/2017)   [employer] of Occupational  Health - Occupational Stress Questionnaire    Feeling of Stress : Not at all  Social Connections: Unknown (05/15/2021)   Received from [facility], [facility]   Social Network    Social Network: Not on file  Intimate Partner Violence: Unknown (04/07/2021)   Received from [facility]   HITS    Physically Hurt: Not on file    Insult or Talk Down To: Not on file    Threaten Physical Harm: Not on file    Scream or Curse: Not on file   Cortland Ding, DNP  [facility] [address] [place], [place] [address] [phone]

## 2023-05-19 ENCOUNTER — Ambulatory Visit: Payer: Medicare Other | Admitting: Neurology

## 2023-05-19 ENCOUNTER — Encounter: Payer: Self-pay | Admitting: Neurology

## 2023-05-19 VITALS — BP 166/73 | HR 56 | Ht 69.0 in | Wt 158.2 lb

## 2023-05-19 DIAGNOSIS — G4733 Obstructive sleep apnea (adult) (pediatric): Secondary | ICD-10-CM | POA: Diagnosis not present

## 2023-05-19 DIAGNOSIS — R269 Unspecified abnormalities of gait and mobility: Secondary | ICD-10-CM | POA: Diagnosis not present

## 2023-05-19 DIAGNOSIS — G4452 New daily persistent headache (NDPH): Secondary | ICD-10-CM

## 2023-05-19 DIAGNOSIS — R413 Other amnesia: Secondary | ICD-10-CM | POA: Diagnosis not present

## 2023-05-19 MED ORDER — LAMOTRIGINE 25 MG PO TABS: 50.0000 mg | ORAL_TABLET | Freq: Two times a day (BID) | ORAL | 11 refills | Status: DC

## 2023-05-19 NOTE — Patient Instructions (Addendum)
 Follow up with cardiology about CPAP, would recommend wearing nightly minimum 4 hours!   Continue current medications   Follow up in 6 months

## 2023-06-06 DIAGNOSIS — M7989 Other specified soft tissue disorders: Secondary | ICD-10-CM | POA: Diagnosis not present

## 2023-06-06 NOTE — Progress Notes (Signed)
 Subjective:     Patient ID: [name] is a 83 y.o. (DOB 1940/06/16) female.  Chief Complaint  Patient presents with  . Varicose Veins   HPI Very pleasant 83 year old female with past history of multiple medical problems who presents to the office in follow-up from a visit with Josette Silvan regarding varicose veins and spider veins.  She is here to follow-up her venous reflux study.  Although she is a somewhat poor historian, she clearly is having severe focal pain underneath the kneecap on the right.  Review of the medical record shows that she did have a steroid injection here back in February for patellar tendinitis.  I reviewed her reflux study and discussed with her.  She has been wearing compression which she says helps with the swelling. The patient's allergies, current medications, past family history, past medical history, past social history, past surgical history and problem list were reviewed and updated as appropriate.   Review of Systems    CONSTITUTIONAL:  Fever, chills, or night sweats? no GENERAL:  Fatigue?  yes Unexpected weight loss or weight gain? no CARDIAC:  Chest pain during exertion or walking? yes PULMONARY:  Shortness of breath during exertion? yes EXTREMITIES:  Leg pain with walking? yes Wounds on the foot not healing? no Pain in the feet that awakens you from sleep (rest pain)? yes NEUROLOGIC:  Weakness or numbness on one side of the body? yes MISC:  Other organ systems reviewed, and are reported negative, except as noted in HPI.  Objective:   Physical Exam Constitutional:      Appearance: She is well-developed.  HENT:     Head: Normocephalic and atraumatic.  Eyes:     Pupils: Pupils are equal, round, and reactive to light.  Cardiovascular:     Rate and Rhythm: Normal rate.  Musculoskeletal:        General: Normal range of motion.     Cervical back: Normal range of motion and neck supple.  Pulmonary:     Effort: Pulmonary effort is normal.   Abdominal:     Palpations: Abdomen is soft.  Skin:    General: Skin is warm and dry.  Neurological:     Mental Status: She is alert and oriented to person, place, and time.        Assessment / Plan:     Assessment No diagnosis found.   Plan I reviewed her reflux study which does demonstrate quite significant reflux in the right small saphenous vein along with some reflux in the right great saphenous vein and significant reflux in the left great saphenous vein.  She has almost no venous symptoms on the left.  On the right side it seems that her symptoms are primarily focused around some focal knee pain.  I have told her therefore that an intervention on her veins may well help with some swelling but would not likely take away her chief complaint at least as it is today.  We discussed continued compression as a conservative therapy and she will discuss further with her orthopedic doctor.  If she requires a vein ablation in the future due to worsening symptoms I have provided her with information would be happy to see her back and arrange for this. All new prescription medications and changes in current prescription dosages were discussed with the patient.  This includes medication name, use, dosage, patient education, potential side effects, drug interactions, consequences of not taking/using and special instructions.  Patient expressed understanding.  No barriers  to adherence.   I have reviewed the information contained in this note and personally verified its accuracy.  I obtained the history of present illness and personally performed the physical exam.

## 2023-06-23 ENCOUNTER — Ambulatory Visit: Payer: Medicare Other | Admitting: Urology

## 2023-07-05 NOTE — Progress Notes (Unsigned)
 Cardiology Office Note:   Date:  07/06/2023  ID:  [name], DOB April 22, 1940, MRN [id number] PCP: Dettinger, Fonda LABOR, MD  [facility] Providers Cardiologist:  None {  History of Present Illness:   [name] is a 83 y.o. female  who presents for follow up of a presyncopal episode.  I saw her for chest pain in 2017.  She had a negative perfusion study.   In August 2019 she had episode of presyncope.  She has severe sleep apnea. She had a negative perfusion study for evaluation of chest pain.  Since I last saw her she has seen vascular surgery at [facility] for some leg pain but no clear etiology.  She is seeing neurology for chronic headaches gait instability and memory loss.  She tells me that she was told she had an old stroke but I do not see a recent head CT.  I see an MRI from 2024.  She does have sleep apnea so she is noncompliant with CPAP.  Of note she mentions syncope in March but she did not see anybody.  Was not entirely clear that she actually passed out.  She states she remembers walking around her house and then knowing that she was going down.  She has a significant gait and a history of falls.  She does not describe palpitations.  She is not having any chest pressure, neck or arm discomfort.  She has some chronic dyspnea.  She has shooting pains in her left chest occasionally.  She has other somatic complaints and decreased hearing.  ROS: As stated in the HPI and negative for all other systems.  Studies Reviewed:    EKG:   EKG Interpretation Date/Time:  Wednesday July 06 2023 13:19:42 EDT Ventricular Rate:  61 PR Interval:  142 QRS Duration:  96 QT Interval:  442 QTC Calculation: 444 R Axis:   31  Text Interpretation: Normal sinus rhythm When compared with ECG of 31-Jan-2023 06:33, No significant change since last tracing Confirmed by Lavona Agent ([id number]) on 07/06/2023 1:30:28 PM 01/31/2023 sinus rhythm, axis within normal limits, intervals within normal limits, no  acute ST-T wave changes.    Risk Assessment/Calculations:      Physical Exam:   VS:  BP (!) 148/82   Pulse 61   Ht 5' 9 (1.753 m)   Wt 154 lb (69.9 kg)   LMP  (LMP Unknown)   BMI 22.74 kg/m    Wt Readings from Last 3 Encounters:  07/06/23 154 lb (69.9 kg)  05/19/23 158 lb 3.2 oz (71.8 kg)  03/02/23 163 lb (73.9 kg)     GEN: Well nourished, well developed in no acute distress NECK: No JVD; No carotid bruits CARDIAC: RRR, no murmurs, rubs, gallops RESPIRATORY:  Clear to auscultation without rales, wheezing or rhonchi  ABDOMEN: Soft, non-tender, non-distended EXTREMITIES:  No edema; No deformity   ASSESSMENT AND PLAN:      BRADYCARDIA: Steady history of this and this will be evaluated as below.  SLEEP APNEA: She says she is currently compliant with her CPAP.  No change in therapy.  SYNCOPE: I do not think she is actually had loss of consciousness.  I think she has falls and a gait disturbance.  However, I will go ahead and do a 3-day monitor.  If this is normal then no further cardiac workup would be suggested.   HTN:  The blood pressure is at target.  No change in therapy.      Follow up  with me as needed based on the results of the above  Signed, Lynwood Schilling, MD

## 2023-07-06 ENCOUNTER — Encounter: Payer: Self-pay | Admitting: Cardiology

## 2023-07-06 ENCOUNTER — Encounter: Payer: Self-pay | Admitting: *Deleted

## 2023-07-06 ENCOUNTER — Ambulatory Visit (INDEPENDENT_AMBULATORY_CARE_PROVIDER_SITE_OTHER): Admitting: Cardiology

## 2023-07-06 VITALS — BP 148/82 | HR 61 | Ht 69.0 in | Wt 154.0 lb

## 2023-07-06 DIAGNOSIS — R5383 Other fatigue: Secondary | ICD-10-CM

## 2023-07-06 DIAGNOSIS — I10 Essential (primary) hypertension: Secondary | ICD-10-CM

## 2023-07-06 DIAGNOSIS — R001 Bradycardia, unspecified: Secondary | ICD-10-CM

## 2023-07-06 DIAGNOSIS — R0602 Shortness of breath: Secondary | ICD-10-CM

## 2023-07-06 NOTE — Patient Instructions (Addendum)
 Medication Instructions:   Continue all current medications.   Labwork:  none  Testing/Procedures:  Your physician has recommended that you wear a 3 day event monitor. Event monitors are medical devices that record the heart's electrical activity. Doctors most often us  these monitors to diagnose arrhythmias. Arrhythmias are problems with the speed or rhythm of the heartbeat. The monitor is a small, portable device. You can wear one while you do your normal daily activities. This is usually used to diagnose what is causing palpitations/syncope (passing out). Office will contact with results via phone, letter or mychart.     Follow-Up:  As needed  Any Other Special Instructions Will Be Listed Below (If Applicable).   If you need a refill on your cardiac medications before your next appointment, please call your pharmacy.

## 2023-07-11 ENCOUNTER — Ambulatory Visit: Payer: Self-pay

## 2023-07-11 NOTE — Telephone Encounter (Signed)
 Left vm for pt to callback

## 2023-07-11 NOTE — Telephone Encounter (Signed)
 FYI Only or Action Required?: FYI only for provider.  Patient was last seen in primary care on 03/02/2023 by Dettinger, Fonda LABOR, MD. Called Nurse Triage reporting Advice Only. Symptoms began today. Interventions attempted: Nothing. Symptoms are: Unknown.  Triage Disposition: No disposition on file.  Patient/caregiver understands and will follow disposition?: no  Pt refuses Triage - only wants to speak with gastro  - requested phone number for that practice.                       Copied from CRM [id number]. Topic: Clinical - Red Word Triage >> Jul 11, 2023 12:05 PM [name] wrote: Red Word that prompted transfer to Nurse Triage: patient called in asking for gastrologist who did her colonoscopy phone number. Patient is passing bright red blood with bowel movement. Started a month ago a little bit, this morning, dark pink in toilet. Answer Assessment - Initial Assessment Questions 1. REASON FOR CALL or QUESTION: What is your reason for calling today? or How can I best help you? or What question do you have that I can help answer?     Pt is having rectal bleeding - pt refuses triage.  PT only wanted phone number for gastro last seen 2024.  Protocols used: Information Only Call - No Triage-A-AH

## 2023-07-14 ENCOUNTER — Ambulatory Visit: Attending: Cardiology

## 2023-07-14 ENCOUNTER — Other Ambulatory Visit: Payer: Self-pay | Admitting: Cardiology

## 2023-07-14 ENCOUNTER — Telehealth: Payer: Self-pay | Admitting: Cardiology

## 2023-07-14 DIAGNOSIS — R55 Syncope and collapse: Secondary | ICD-10-CM

## 2023-07-14 NOTE — Telephone Encounter (Signed)
 Checking percert on the following   LONG TERM MONITOR XT (3-14 DAYS)

## 2023-07-19 ENCOUNTER — Other Ambulatory Visit (HOSPITAL_COMMUNITY): Payer: Self-pay | Admitting: Family Medicine

## 2023-07-19 DIAGNOSIS — Z1231 Encounter for screening mammogram for malignant neoplasm of breast: Secondary | ICD-10-CM

## 2023-07-19 NOTE — Telephone Encounter (Signed)
 Spoke with pt this am. She has an appt scheduled with Dr. Albertus (GI) 8/6. Offered to make a sooner appt here and pt declined. She states that she doesn't feel that well but denies passing anymore blood in her stool. Advised pt to increase iron in her diet for now.

## 2023-07-25 ENCOUNTER — Other Ambulatory Visit (HOSPITAL_COMMUNITY): Payer: Self-pay | Admitting: Family Medicine

## 2023-07-25 ENCOUNTER — Ambulatory Visit (HOSPITAL_COMMUNITY)
Admission: RE | Admit: 2023-07-25 | Discharge: 2023-07-25 | Disposition: A | Discharge status: Home / Self Care | Source: Ambulatory Visit | Attending: Family Medicine | Admitting: Family Medicine

## 2023-07-25 ENCOUNTER — Encounter (HOSPITAL_COMMUNITY): Payer: Self-pay

## 2023-07-25 DIAGNOSIS — Z1231 Encounter for screening mammogram for malignant neoplasm of breast: Secondary | ICD-10-CM | POA: Diagnosis not present

## 2023-07-29 DIAGNOSIS — R55 Syncope and collapse: Secondary | ICD-10-CM | POA: Diagnosis not present

## 2023-08-07 ENCOUNTER — Ambulatory Visit: Payer: Self-pay | Admitting: Cardiology

## 2023-08-07 DIAGNOSIS — R55 Syncope and collapse: Secondary | ICD-10-CM | POA: Diagnosis not present

## 2023-08-09 NOTE — Progress Notes (Unsigned)
 Leslie Console, PA-C [address] [place], [place]  [address] Phone: [phone]   Primary Care Physician: Dettinger, Fonda LABOR, MD  Primary Gastroenterologist:  Leslie Console, PA-C / Dr. Gordy Starch   Chief Complaint:  Rectal Bleeding, Nausea       HPI:   [name] is a 83 y.o. female, established patient Dr. Starch, presents for an episode of rectal bleeding which occurred 3 weeks ago.  It occurred after a bowel movement.  She saw a lot of bright red blood on the tissue and in the toilet.  Lasted 1 day and resolved.  She has not had any more episodes of bleeding in the past few weeks.  She denies constipation, hard stools, straining, or rectal pain.  She has a soft bowel movement once daily.  Has history of hemorrhoids in the past.  No current treatment.  History of constipation in the past but no current treatment.  He states she has chronic intermittent lower abdominal soreness which comes and goes.  No abdominal pain today.  No weight loss.  She has nausea in the mornings.  No vomiting.  She reports acid regurgitation after drinking water  or eating food.  She is not having any solid food dysphagia or food bolus episodes.  No chest pain or heartburn.  No current treatment for acid reflux.  Last labs 02/2023 showed normal CBC, CMP.  Hgb 12.7g.  Elevated TSH 8.47.    PMH: COPD (Emphysema), hypertension, hyperlipidemia, hypothyroidism, severe sleep apnea (noncompliant with CPAP), memory impairment, gait instability, peripheral neuropathy, hiatal hernia, GERD, dysphagia responsive to dilatation, Hx esophageal candidiasis, adenomatous colon polyps, submucosal lesion at the appendiceal orifice 02/2014 status postsurgical resection (path report showed benign fibrous obliteration of the appendix).  Has history of constipation treated with MiraLAX.  History of atrophic gastritis (H. pylori negative).  02/2023 labs: CBC and CMP normal.  Elevated TSH 8.47.  Normal hemoglobin 12.7.  01/31/2023  CT abdomen pelvis with contrast: 1. No acute intra-abdominal or pelvic pathology. 2. Cholelithiasis. 3. No bowel obstruction. 4. A 4 cm calcified uterine fibroid. 5.  Aortic Atherosclerosis  01/22/2021 last EGD by Dr. Starch: Esophageal candidiasis in the upper and middle esophagus.  Chronic atrophic gastritis with intestinal metaplasia.  No gastric polyps.  Normal duodenum.  Treated with fluconazole  for 14 days.  Biopsies negative for H. pylori and GAVE.  07/2018 EGD: Benign gastric polyps.  Otherwise normal.  05/2019 last colonoscopy: 5 polyps (tubular adenomas and one inflammatory polyp) removed.  Diverticulosis.  Internal hemorrhoids.  No repeat colonoscopy due to advanced age.  Current Outpatient Medications  Medication Sig Dispense Refill   albuterol  (VENTOLIN  HFA) 108 (90 Base) MCG/ACT inhaler Inhale 2 puffs into the lungs every 6 (six) hours as needed for wheezing or shortness of breath. 8 g 0   aspirin 81 MG tablet Take 81 mg by mouth daily.     busPIRone  (BUSPAR ) 30 MG tablet Take 0.5-1 tablets (15-30 mg total) by mouth daily as needed. TAKE (1/2) TO (1) TABLET BY MOUTH ONCE DAILY AS NEEDED 90 tablet 3   butalbital -acetaminophen -caffeine  (FIORICET) 50-325-40 MG tablet Take 1 tab at onset of migraine.  May repeat in 2 hrs, if needed.  Max dose: 2 tabs/day. This is a 30 day prescription. 12 tablet 5   Calcium Carbonate-Vitamin D  500-125 MG-UNIT TABS Take 1 capsule by mouth daily.     Cyanocobalamin  (VITAMIN B 12 PO) Take 1 tablet by mouth daily.  dimenhyDRINATE (DRAMAMINE) 50 MG tablet Take 50 mg by mouth every 8 (eight) hours as needed.     donepezil  (ARICEPT ) 10 MG tablet TAKE ONE TABLET BY MOUTH AT BEDTIME. 90 tablet 3   estradiol  (ESTRACE ) 0.1 MG/GM vaginal cream Discard plastic applicator. Insert a blueberry size amount (approximately 1 gram) of cream on fingertip inside vagina at bedtime every night for 1 week then every other night. For long term use. 30 g 3   ezetimibe   (ZETIA ) 10 MG tablet Take 1 tablet (10 mg total) by mouth daily. 90 tablet 3   fluticasone  (FLONASE ) 50 MCG/ACT nasal spray Place 2 sprays into both nostrils daily. 16 g 6   ibuprofen  (ADVIL ,MOTRIN ) 200 MG tablet Take 800 mg by mouth every 6 (six) hours as needed (Pain).      lamoTRIgine  (LAMICTAL ) 25 MG tablet Take 2 tablets (50 mg total) by mouth 2 (two) times daily. 120 tablet 11   levothyroxine  (SYNTHROID ) 175 MCG tablet Take 1 tablet (175 mcg total) by mouth daily. 90 tablet 1   lisinopril  (ZESTRIL ) 2.5 MG tablet Take 1 tablet (2.5 mg total) by mouth daily. 90 tablet 3   Multiple Vitamin (MULTIVITAMIN) capsule Take 1 capsule by mouth daily.     Omega-3 Fatty Acids (FISH OIL) 1000 MG CAPS Take 1,000 mg by mouth daily.     pantoprazole  (PROTONIX ) 20 MG tablet Take 1 tablet (20 mg total) by mouth daily. 90 tablet 3   Vibegron  (GEMTESA ) 75 MG TABS Take 1 tablet (75 mg total) by mouth daily. 90 tablet 3   No current facility-administered medications for this visit.    Allergies as of 08/10/2023 - Review Complete 08/10/2023  Allergen Reaction Noted   Atorvastatin Other (See Comments)    Meperidine hcl Nausea And Vomiting    Statins Other (See Comments)    Norco [hydrocodone -acetaminophen ] Nausea And Vomiting 02/13/2016   Sulfonamide derivatives Other (See Comments) 05/22/2008    Past Medical History:  Diagnosis Date   Anxiety state, unspecified    Arthritis    Arthropathy, unspecified, site unspecified    Carpal tunnel syndrome    Bilateral   Cataract    Right 7 Left eye remved cateracts   Chronic airway obstruction, not elsewhere classified    patient denies on 02/27/14   Esophageal reflux    Esophageal stricture    Hypertension    Internal hemorrhoids without mention of complication    Malignant neoplasm of breast (female), unspecified site    left breast    Memory disorder 09/24/2013   Moderate obstructive sleep apnea 01/20/2021   Nocturnal leg cramps 06/29/2018    Osteopenia    Other and unspecified hyperlipidemia    Personal history of colonic polyps    adenomatous   Polyneuropathy in other diseases classified elsewhere (HCC) 09/24/2013   Shingles    Sleep apnea    no longer uses cpap   Stroke (HCC)    Tubular adenoma of colon    Unspecified hypothyroidism     Past Surgical History:  Procedure Laterality Date   APPENDECTOMY  03/2014   BACK SURGERY     X2   BREAST LUMPECTOMY     COLONOSCOPY  08/03/2018   EYE SURGERY     cataracts bilateral   LAPAROSCOPIC APPENDECTOMY N/A 02/28/2014   Procedure: APPENDECTOMY LAPAROSCOPIC PARTIAL CECECTOMY FOR MUCOCELE OF APPENDIX ;  Surgeon: Elspeth Schultze, MD;  Location: [facility];  Service: General;  Laterality: N/A;   MASTECTOMY     left  POLYPECTOMY     THYROIDECTOMY     TUBAL LIGATION     UPPER GASTROINTESTINAL ENDOSCOPY      Review of Systems:    All systems reviewed and negative except where noted in HPI.    Physical Exam:  BP 118/70   Pulse (!) 57   LMP  (LMP Unknown)  No LMP recorded (lmp unknown). Patient is postmenopausal.  General: Well-nourished, well-developed in no acute distress.  Lungs: Clear to auscultation bilaterally. Non-labored. Heart: Regular rate and rhythm, no murmurs rubs or gallops.  Abdomen: Bowel sounds are normal; Abdomen is Soft; No hepatosplenomegaly, masses or hernias;  No Abdominal Tenderness; No guarding or rebound tenderness. Rectal: 1 large external hemorrhoid skin tag present.  Nonthrombosed, not tender.  Normal internal rectal exam.  No tenderness, fissures, or masses.  Weak anal sphincter tone.  Hemoccult negative brown formed stool. Neuro: Alert and oriented x 3.  Grossly intact.  Psych: Alert and cooperative, normal mood and affect.  Chaperone for Exam:  Alethea Blocker, CMA   Imaging Studies: LONG TERM MONITOR XT (3-14 DAYS) Result Date: 08/07/2023 The rhythm is normal sinus Occasional runs of SVT with the longest lasting 19.2 seconds Rare ventricular and  atrial ectopy.  MM 3D SCREENING MAMMOGRAM UNILATERAL RIGHT BREAST Result Date: 07/27/2023 CLINICAL DATA:  Screening. EXAM: DIGITAL SCREENING UNILATERAL RIGHT MAMMOGRAM WITH CAD AND TOMOSYNTHESIS TECHNIQUE: Right screening digital craniocaudal and mediolateral oblique mammograms were obtained. Right screening digital breast tomosynthesis was performed. The images were evaluated with computer-aided detection. COMPARISON:  Previous exam(s). ACR Breast Density Category b: There are scattered areas of fibroglandular density. FINDINGS: There are no findings suspicious for malignancy. IMPRESSION: No mammographic evidence of malignancy. A result letter of this screening mammogram will be mailed directly to the patient. RECOMMENDATION: Screening mammogram in one year. (Code:SM-B-01Y) BI-RADS CATEGORY  1: Negative. Electronically Signed   By: Debby Satterfield M.D.   On: 07/27/2023 15:03    Labs: CBC    Component Value Date/Time   WBC 5.6 08/10/2023 0914   RBC 4.55 08/10/2023 0914   HGB 13.0 08/10/2023 0914   HGB 12.7 03/02/2023 0914   HGB 13.0 04/23/2013 1549   HCT 40.4 08/10/2023 0914   HCT 39.4 03/02/2023 0914   HCT 38.5 04/23/2013 1549   PLT 231.0 08/10/2023 0914   PLT 231 03/02/2023 0914   MCV 88.7 08/10/2023 0914   MCV 89 03/02/2023 0914   MCV 91.5 04/23/2013 1549   MCH 28.8 03/02/2023 0914   MCH 29.3 01/31/2023 0632   MCHC 32.3 08/10/2023 0914   RDW 15.3 08/10/2023 0914   RDW 13.6 03/02/2023 0914   RDW 14.1 04/23/2013 1549   LYMPHSABS 1.4 08/10/2023 0914   LYMPHSABS 1.5 03/02/2023 0914   LYMPHSABS 1.7 04/23/2013 1549   MONOABS 0.5 08/10/2023 0914   MONOABS 0.6 04/23/2013 1549   EOSABS 0.2 08/10/2023 0914   EOSABS 0.2 03/02/2023 0914   BASOSABS 0.0 08/10/2023 0914   BASOSABS 0.0 03/02/2023 0914   BASOSABS 0.0 04/23/2013 1549    CMP     Component Value Date/Time   NA 142 08/10/2023 0914   NA 144 03/02/2023 0914   NA 144 04/23/2013 1549   K 4.1 08/10/2023 0914   K 3.5  04/23/2013 1549   CL 104 08/10/2023 0914   CO2 30 08/10/2023 0914   CO2 25 04/23/2013 1549   GLUCOSE 90 08/10/2023 0914   GLUCOSE 97 04/23/2013 1549   BUN 12 08/10/2023 0914   BUN 16 03/02/2023 0914  BUN 20.1 04/23/2013 1549   CREATININE 0.55 08/10/2023 0914   CREATININE 0.7 04/23/2013 1549   CALCIUM 9.1 08/10/2023 0914   CALCIUM 10.3 04/23/2013 1549   PROT 6.7 08/10/2023 0914   PROT 6.4 03/02/2023 0914   PROT 6.8 04/23/2013 1549   ALBUMIN 4.5 08/10/2023 0914   ALBUMIN 4.4 03/02/2023 0914   ALBUMIN 4.1 04/23/2013 1549   AST 12 08/10/2023 0914   AST 31 04/23/2013 1549   ALT 10 08/10/2023 0914   ALT 32 04/23/2013 1549   ALKPHOS 54 08/10/2023 0914   ALKPHOS 74 04/23/2013 1549   BILITOT 0.6 08/10/2023 0914   BILITOT 0.5 03/02/2023 0914   BILITOT 0.53 04/23/2013 1549   GFRNONAA >60 01/31/2023 0632   GFRNONAA >89 07/10/2012 1056   GFRAA >60 07/02/2019 0815   GFRAA >89 07/10/2012 1056     Assessment and Plan:   [name] is a 83 y.o. y/o female presents for evaluation:  1.  1 episode of bright red rectal bleeding 3 weeks ago, currently resolved. Suspect Hemorrhoidal bleeding.  Hemoccult negative rectal exam today.  Exam showed external hemorrhoid.  Previous colonoscopy showed internal hemorrhoids. -Lab CBC - Screen for Anemia. - If she has iron deficiency anemia, or recurrent persistent rectal bleeding, then repeat colonoscopy is next step.  2.  Internal and external hemorrhoids - Recommend OTC Preparation H as needed. - Recommend OTC stool softener if she has any constipation.  Patient denies constipation.  3.  Nausea -suspect due to acid reflux. - Lab: CMP - Start PPI with follow-up.  4.  GERD with regurgitation; not currently on treatment  - Start Rx pantoprazole  20 Mg once daily, #90, 3 refills.  5.  History of esophageal candidiasis - If she develops recurrent solid food dysphagia, then consider UGIS, EGD, or empiric treatment with fluconazole . - She is not  having any solid food dysphagia or odynophagia.  6.  History of adenomatous colon polyps -No further surveillance colonoscopies or recommended due to advanced age and increased risk of procedure.  7.  Hypothyroidism -Lab: TSH   Leslie Console, PA-C  Follow up in 6 weeks with TG to assess response to treatment.  Also follow-up based on lab results and GI symptoms.

## 2023-08-10 ENCOUNTER — Other Ambulatory Visit (INDEPENDENT_AMBULATORY_CARE_PROVIDER_SITE_OTHER)

## 2023-08-10 ENCOUNTER — Encounter: Payer: Self-pay | Admitting: Physician Assistant

## 2023-08-10 ENCOUNTER — Ambulatory Visit: Admitting: Physician Assistant

## 2023-08-10 VITALS — BP 118/70 | HR 57

## 2023-08-10 DIAGNOSIS — K219 Gastro-esophageal reflux disease without esophagitis: Secondary | ICD-10-CM | POA: Diagnosis not present

## 2023-08-10 DIAGNOSIS — K644 Residual hemorrhoidal skin tags: Secondary | ICD-10-CM | POA: Diagnosis not present

## 2023-08-10 DIAGNOSIS — E039 Hypothyroidism, unspecified: Secondary | ICD-10-CM | POA: Diagnosis not present

## 2023-08-10 DIAGNOSIS — R110 Nausea: Secondary | ICD-10-CM

## 2023-08-10 DIAGNOSIS — K648 Other hemorrhoids: Secondary | ICD-10-CM | POA: Diagnosis not present

## 2023-08-10 DIAGNOSIS — Z860101 Personal history of adenomatous and serrated colon polyps: Secondary | ICD-10-CM

## 2023-08-10 DIAGNOSIS — K625 Hemorrhage of anus and rectum: Secondary | ICD-10-CM

## 2023-08-10 DIAGNOSIS — K649 Unspecified hemorrhoids: Secondary | ICD-10-CM

## 2023-08-10 LAB — COMPREHENSIVE METABOLIC PANEL WITH GFR
ALT: 10 U/L (ref 0–35)
AST: 12 U/L (ref 0–37)
Albumin: 4.5 g/dL (ref 3.5–5.2)
Alkaline Phosphatase: 54 U/L (ref 39–117)
BUN: 12 mg/dL (ref 6–23)
CO2: 30 meq/L (ref 19–32)
Calcium: 9.1 mg/dL (ref 8.4–10.5)
Chloride: 104 meq/L (ref 96–112)
Creatinine, Ser: 0.55 mg/dL (ref 0.40–1.20)
GFR: 85.38 mL/min (ref >60.00)
Glucose, Bld: 90 mg/dL (ref 70–99)
Potassium: 4.1 meq/L (ref 3.5–5.1)
Sodium: 142 meq/L (ref 135–145)
Total Bilirubin: 0.6 mg/dL (ref 0.2–1.2)
Total Protein: 6.7 g/dL (ref 6.0–8.3)

## 2023-08-10 LAB — CBC WITH DIFFERENTIAL/PLATELET
Basophils Absolute: 0 K/uL (ref 0.0–0.1)
Basophils Relative: 0.6 % (ref 0.0–3.0)
Eosinophils Absolute: 0.2 K/uL (ref 0.0–0.7)
Eosinophils Relative: 3.7 % (ref 0.0–5.0)
HCT: 40.4 % (ref 36.0–46.0)
Hemoglobin: 13 g/dL (ref 12.0–15.0)
Lymphocytes Relative: 24.3 % (ref 12.0–46.0)
Lymphs Abs: 1.4 K/uL (ref 0.7–4.0)
MCHC: 32.3 g/dL (ref 30.0–36.0)
MCV: 88.7 fl (ref 78.0–100.0)
Monocytes Absolute: 0.5 K/uL (ref 0.1–1.0)
Monocytes Relative: 8.2 % (ref 3.0–12.0)
Neutro Abs: 3.6 K/uL (ref 1.4–7.7)
Neutrophils Relative %: 63.2 % (ref 43.0–77.0)
Platelets: 231 K/uL (ref 150.0–400.0)
RBC: 4.55 Mil/uL (ref 3.87–5.11)
RDW: 15.3 % (ref 11.5–15.5)
WBC: 5.6 K/uL (ref 4.0–10.5)

## 2023-08-10 LAB — TSH: TSH: 2.36 u[IU]/mL (ref 0.35–5.50)

## 2023-08-10 MED ORDER — PANTOPRAZOLE SODIUM 20 MG PO TBEC: 20.0000 mg | DELAYED_RELEASE_TABLET | Freq: Every day | ORAL | 3 refills | Status: AC

## 2023-08-10 NOTE — Patient Instructions (Addendum)
 Your provider has requested that you go to the basement level for lab work before leaving today. Press B on the elevator. The lab is located at the first door on the left as you exit the elevator.  We have sent the following medications to your pharmacy for you to pick up at your convenience: Pantoprazole  20 mg once daily   Please follow up sooner if symptoms increase or worsen  Due to recent changes in healthcare laws, you may see the results of your imaging and laboratory studies on MyChart before your provider has had a chance to review them.  We understand that in some cases there may be results that are confusing or concerning to you. Not all laboratory results come back in the same time frame and the provider may be waiting for multiple results in order to interpret others.  Please give us  48 hours in order for your provider to thoroughly review all the results before contacting the office for clarification of your results.   Thank you for trusting me with your gastrointestinal care!   Ellouise Console, PA-C _______________________________________________________  If your blood pressure at your visit was 140/90 or greater, please contact your primary care physician to follow up on this.  _______________________________________________________  If you are age 80 or older, your body mass index should be between 23-30. Your There is no height or weight on file to calculate BMI. If this is out of the aforementioned range listed, please consider follow up with your Primary Care Provider.  If you are age 20 or younger, your body mass index should be between 19-25. Your There is no height or weight on file to calculate BMI. If this is out of the aformentioned range listed, please consider follow up with your Primary Care Provider.   ________________________________________________________  The [facility] providers would like to encourage you to use MYCHART to communicate with providers for  non-urgent requests or questions.  Due to long hold times on the telephone, sending your provider a message by [facility] may be a faster and more efficient way to get a response.  Please allow 48 business hours for a response.  Please remember that this is for non-urgent requests.  _______________________________________________________

## 2023-08-11 ENCOUNTER — Ambulatory Visit: Payer: Self-pay | Admitting: Physician Assistant

## 2023-08-11 ENCOUNTER — Ambulatory Visit: Payer: Self-pay | Admitting: Family Medicine

## 2023-08-11 NOTE — Progress Notes (Signed)
 Call and notify patient all her labs are normal. CBC, CMP, TSH are normal.  White count, hemoglobin, kidney test, liver test, and thyroid  test are normal.  Continue with current plan. Ellouise Console, PA-C

## 2023-08-11 NOTE — Telephone Encounter (Signed)
 Appt 08/17/23

## 2023-08-11 NOTE — Telephone Encounter (Signed)
 FYI Only or Action Required?: Action required by provider: request for appointment.  Patient was last seen in primary care on 03/02/2023 by Dettinger, Fonda LABOR, MD.  Called Nurse Triage reporting Leg Swelling.  Symptoms began several months ago.  Symptoms are: unchanged.  Triage Disposition: See Physician Within 24 Hours  Patient/caregiver understands and will follow disposition?: No, refuses disposition                           Copied from CRM [id number]. Topic: Clinical - Red Word Triage >> Aug 11, 2023 11:12 AM Harlene ORN wrote: Red Word that prompted transfer to Nurse Triage: Called to try to reschedule her appointment with PCP for  08/28 to something much sooner. Because her swelling her leg is so severe she says it takes her breath away from how much pain she is in Reason for Disposition  [1] MODERATE leg swelling (e.g., swelling extends up to knees) AND [2] new-onset or getting worse  Answer Assessment - Initial Assessment Questions Pt scheduled for first available appt with Dr. Maryanne on 8/13 in office. Pt refused to see another provider or go to urgent care. This RN educated pt on new-worsening symptoms and when to call back/seek emergent care. Pt verbalized understanding and agrees to plan.      ONSET: When did the swelling start? (e.g., minutes, hours, days)     8 months ago; pt states she has seen doctors for this LOCATION: What part of the leg is swollen?  Are both legs swollen or just one leg?     Right knee  SEVERITY: How bad is the swelling? (e.g., localized; mild, moderate, severe)     Localized REDNESS: Is there redness or signs of infection?     No PAIN: Is the swelling painful to touch? If Yes, ask: How painful is it?   (Scale 1-10; mild, moderate or severe)    Intermittent throbbing CAUSE: What do you think is causing the leg swelling?     No OTHER SYMPTOMS: Do you have any other symptoms? (e.g., chest pain,  difficulty breathing)       Causing pt to get off balance  Protocols used: Leg Swelling and Edema-A-AH

## 2023-08-17 ENCOUNTER — Ambulatory Visit (INDEPENDENT_AMBULATORY_CARE_PROVIDER_SITE_OTHER): Admitting: Family Medicine

## 2023-08-17 ENCOUNTER — Encounter: Payer: Self-pay | Admitting: Family Medicine

## 2023-08-17 VITALS — BP 148/55 | HR 66 | Ht 69.0 in | Wt 151.0 lb

## 2023-08-17 DIAGNOSIS — M25561 Pain in right knee: Secondary | ICD-10-CM | POA: Diagnosis not present

## 2023-08-17 DIAGNOSIS — G8929 Other chronic pain: Secondary | ICD-10-CM | POA: Diagnosis not present

## 2023-08-17 NOTE — Progress Notes (Signed)
 BP (!) 148/55   Pulse 66   Ht 5' 9 (1.753 m)   Wt 151 lb (68.5 kg)   LMP  (LMP Unknown)   SpO2 99%   BMI 22.30 kg/m    Subjective:   Patient ID: [name], female    DOB: 1940/11/12, 83 y.o.   MRN: [id number]  HPI: [name] is a 83 y.o. female presenting on 08/17/2023 for Knee Pain (And swelling. Right.)   Discussed the use of AI scribe software for clinical note transcription with the patient, who gave verbal consent to proceed.  History of Present Illness   [name] is an 83 year old female with neuropathy who presents with right knee pain.  She experiences significant pain in her right knee, located on both the medial and lateral aspects. The pain is severe, limiting her ability to walk outside, and has been present since August 2024, progressively worsening over time.  She has consulted multiple specialists, including an orthopedic and a vascular doctor. She reports that the orthopedic specialist initially suspected arthritis, and that the vascular doctor found vascular venous reflux in the saphenous vein, but she was told this was not the source of her knee pain. Despite these consultations, the exact cause of her knee pain remains undiagnosed.  In addition to knee pain, she reports muscle tightness and pain in her quadriceps and calf, which she attributes to altered walking patterns. She experiences muscle cramps and soreness in her calf and thigh muscles, particularly when moving her knee or attempting to walk, with severe pain in the calf muscle.  She has a history of neuropathy and a past stroke, which was identified after a fall and subsequent imaging revealed a spot in her head and a possible blood clot.  No significant pain when the kneecap is moved but soreness throughout the knee. Muscle cramps and tightness, especially when the knee is bent or when attempting to walk.          Relevant past medical, surgical, family and social history reviewed and  updated as indicated. Interim medical history since our last visit reviewed. Allergies and medications reviewed and updated.  Review of Systems  Constitutional:  Negative for chills and fever.  Eyes:  Negative for visual disturbance.  Cardiovascular:  Positive for leg swelling. Negative for chest pain.  Genitourinary:  Negative for difficulty urinating and dysuria.  Musculoskeletal:  Positive for arthralgias, gait problem and joint swelling. Negative for back pain.  Skin:  Negative for rash.  Neurological:  Negative for light-headedness and headaches.  Psychiatric/Behavioral:  Negative for agitation and behavioral problems.   All other systems reviewed and are negative.   Per HPI unless specifically indicated above   Allergies as of 08/17/2023       Reactions   Atorvastatin Other (See Comments)   Muscle cramps   Meperidine Hcl Nausea And Vomiting   Makes me 'deathly' sick   Statins Other (See Comments)   REACTION: Patient cannot tolerate due cramps   Norco [hydrocodone -acetaminophen ] Nausea And Vomiting   Sulfonamide Derivatives Other (See Comments)   Unknown reaction        Medication List        Accurate as of August 17, 2023 11:26 AM. If you have any questions, ask your nurse or doctor.          albuterol  108 (90 Base) MCG/ACT inhaler Commonly known as: VENTOLIN  HFA Inhale 2 puffs into the lungs every 6 (six) hours as needed for wheezing or  shortness of breath.   aspirin 81 MG tablet Take 81 mg by mouth daily.   busPIRone  30 MG tablet Commonly known as: BUSPAR  Take 0.5-1 tablets (15-30 mg total) by mouth daily as needed. TAKE (1/2) TO (1) TABLET BY MOUTH ONCE DAILY AS NEEDED   butalbital -acetaminophen -caffeine  50-325-40 MG tablet Commonly known as: FIORICET Take 1 tab at onset of migraine.  May repeat in 2 hrs, if needed.  Max dose: 2 tabs/day. This is a 30 day prescription.   Calcium Carbonate-Vitamin D  500-125 MG-UNIT Tabs Take 1 capsule by mouth  daily.   dimenhyDRINATE 50 MG tablet Commonly known as: DRAMAMINE Take 50 mg by mouth every 8 (eight) hours as needed.   donepezil  10 MG tablet Commonly known as: ARICEPT  TAKE ONE TABLET BY MOUTH AT BEDTIME.   estradiol  0.1 MG/GM vaginal cream Commonly known as: ESTRACE  Discard plastic applicator. Insert a blueberry size amount (approximately 1 gram) of cream on fingertip inside vagina at bedtime every night for 1 week then every other night. For long term use.   ezetimibe  10 MG tablet Commonly known as: ZETIA  Take 1 tablet (10 mg total) by mouth daily.   Fish Oil 1000 MG Caps Take 1,000 mg by mouth daily.   fluticasone  50 MCG/ACT nasal spray Commonly known as: FLONASE  Place 2 sprays into both nostrils daily.   Gemtesa  75 MG Tabs Generic drug: Vibegron  Take 1 tablet (75 mg total) by mouth daily.   ibuprofen  200 MG tablet Commonly known as: ADVIL  Take 800 mg by mouth every 6 (six) hours as needed (Pain).   lamoTRIgine  25 MG tablet Commonly known as: LAMICTAL  Take 2 tablets (50 mg total) by mouth 2 (two) times daily.   levothyroxine  175 MCG tablet Commonly known as: SYNTHROID  Take 1 tablet (175 mcg total) by mouth daily.   lisinopril  2.5 MG tablet Commonly known as: ZESTRIL  Take 1 tablet (2.5 mg total) by mouth daily.   multivitamin capsule Take 1 capsule by mouth daily.   pantoprazole  20 MG tablet Commonly known as: Protonix  Take 1 tablet (20 mg total) by mouth daily.   VITAMIN B 12 PO Take 1 tablet by mouth daily.         Objective:   BP (!) 148/55   Pulse 66   Ht 5' 9 (1.753 m)   Wt 151 lb (68.5 kg)   LMP  (LMP Unknown)   SpO2 99%   BMI 22.30 kg/m   Wt Readings from Last 3 Encounters:  08/17/23 151 lb (68.5 kg)  07/06/23 154 lb (69.9 kg)  05/19/23 158 lb 3.2 oz (71.8 kg)    Physical Exam Vitals and nursing note reviewed.  Constitutional:      Appearance: Normal appearance.  Musculoskeletal:     Right upper leg: Tenderness (Slight  quadriceps muscular tenderness) present.     Right knee: Swelling (Trace edema in bilateral lower extremity, slightly more on the right especially near the) and crepitus present. Tenderness present over the medial joint line and lateral joint line. No LCL laxity, MCL laxity, ACL laxity or PCL laxity. Normal meniscus.     Instability Tests: Anterior drawer test negative. Posterior drawer test negative. Anterior Lachman test negative.     Right lower leg: Swelling (Trace) and tenderness (Slight calf muscle tenderness) present. No deformity.  Neurological:     Mental Status: She is alert.    Physical Exam   MUSCULOSKELETAL: Right knee pain on movement and external bending, less pain on internal bending. Calf tenderness and thigh  soreness.         Assessment & Plan:   Problem List Items Addressed This Visit   None Visit Diagnoses       Chronic pain of right knee    -  Primary   Relevant Orders   Ambulatory referral to Orthopedic Surgery   Ambulatory referral to Physical Therapy           Right knee pain with associated right thigh and calf muscle pain Chronic right knee pain radiating to thigh and calf, likely due to altered gait. Previous evaluations inconclusive. Vascular venous reflux noted but not primary pain source. - Refer to new orthopedic specialist for further evaluation. - Refer to physical therapy.  Chronic venous reflux in saphenous vein Chronic venous reflux identified, contributing to swelling but not primary knee pain source.      Follow up plan: Return if symptoms worsen or fail to improve.  Counseling provided for all of the vaccine components Orders Placed This Encounter  Procedures   Ambulatory referral to Orthopedic Surgery   Ambulatory referral to Physical Therapy    Fonda Levins, MD [facility] 08/17/2023, 11:26 AM

## 2023-09-01 ENCOUNTER — Other Ambulatory Visit (INDEPENDENT_AMBULATORY_CARE_PROVIDER_SITE_OTHER)

## 2023-09-01 ENCOUNTER — Ambulatory Visit: Payer: Medicare Other | Admitting: Family Medicine

## 2023-09-01 ENCOUNTER — Ambulatory Visit: Admitting: Orthopaedic Surgery

## 2023-09-01 DIAGNOSIS — M1711 Unilateral primary osteoarthritis, right knee: Secondary | ICD-10-CM | POA: Diagnosis not present

## 2023-09-01 DIAGNOSIS — I10 Essential (primary) hypertension: Secondary | ICD-10-CM

## 2023-09-01 DIAGNOSIS — E7800 Pure hypercholesterolemia, unspecified: Secondary | ICD-10-CM

## 2023-09-01 NOTE — Progress Notes (Signed)
 Office Visit Note   Patient: [name]           Date of Birth: [birth year]           MRN: [id number] Visit Date: 09/01/2023              Requested by: [name] [name] [address] [name],  [place] [address] PCP: Dettinger, Spence [name]   Assessment & Plan: Visit Diagnoses:  1. Primary osteoarthritis of right knee     Plan: History of Present Illness [name] is an 83 year old female who presents with worsening right knee pain radiating to the hip and leg.  She has experienced right knee pain for several years, which has progressively worsened. The pain radiates down her leg and up to her hip, with a specific spot in her knee that is particularly painful, sometimes feeling like a sharp pain. There is a 'little knot' in the area that is sore to touch.  She has received two cortisone injections at the sports medicine clinic in [place], providing minimal relief. She recalls another injection a few years ago, but details are unclear.  The pain is located on the outside part of her knee and sometimes under the kneecap, leading to an aching sensation extending through her muscle. She experiences episodes where the knee feels like it might give way, with a sensation of 'collapsing' if she does not have support.  No current medications specifically for her knee pain.  Exam of the right knee shows mild soft tissue swelling.  Normal range of motion.  Cause increases are stable.  Assessment and Plan Right knee osteoarthritis Chronic osteoarthritis with worsening pain, minimal relief from cortisone injections, cartilage wear causing instability. - Obtain approval for gel injection for the right knee. This patient is diagnosed with osteoarthritis of the knee(s).    Radiographs show evidence of joint space narrowing, osteophytes, subchondral sclerosis and/or subchondral cysts.  This patient has knee pain which interferes with functional and activities of daily living.     This patient has experienced inadequate response, adverse effects and/or intolerance with conservative treatments such as acetaminophen , NSAIDS, topical creams, physical therapy or regular exercise, knee bracing and/or weight loss.   This patient has experienced inadequate response or has a contraindication to intra articular steroid injections for at least 3 months.   This patient is not scheduled to have a total knee replacement within 6 months of starting treatment with viscosupplementation.  Follow-Up Instructions: No follow-ups on file.   Orders:  Orders Placed This Encounter  Procedures   XR KNEE 3 VIEW RIGHT   No orders of the defined types were placed in this encounter.     Procedures: No procedures performed   Clinical Data: No additional findings.   Subjective: Chief Complaint  Patient presents with   Right Knee - Pain    HPI  Review of Systems  Constitutional: Negative.   HENT: Negative.    Eyes: Negative.   Respiratory: Negative.    Cardiovascular: Negative.   Endocrine: Negative.   Musculoskeletal: Negative.   Neurological: Negative.   Hematological: Negative.   Psychiatric/Behavioral: Negative.    All other systems reviewed and are negative.    Objective: Vital Signs: LMP  (LMP Unknown)   Physical Exam Vitals and nursing note reviewed.  Constitutional:      Appearance: She is well-developed.  HENT:     Head: Atraumatic.     Nose: Nose normal.  Eyes:  Extraocular Movements: Extraocular movements intact.  Cardiovascular:     Pulses: Normal pulses.  Pulmonary:     Effort: Pulmonary effort is normal.  Abdominal:     Palpations: Abdomen is soft.  Musculoskeletal:     Cervical back: Neck supple.  Skin:    General: Skin is warm.     Capillary Refill: Capillary refill takes less than 2 seconds.  Neurological:     Mental Status: She is alert. Mental status is at baseline.  Psychiatric:        Behavior: Behavior normal.         Thought Content: Thought content normal.        Judgment: Judgment normal.     Ortho Exam  Specialty Comments:  No specialty comments available.  Imaging: No results found.   PMFS History: Patient Active Problem List   Diagnosis Date Noted   Primary osteoarthritis of right knee 09/01/2023   Mixed stress and urge urinary incontinence 11/09/2022   Dry mouth 11/09/2022   Loss of consciousness (HCC) 10/12/2022   New daily persistent headache 10/12/2022   Bilateral low back pain with left-sided sciatica 10/12/2022   Low back pain 08/10/2022   OAB (overactive bladder) 12/07/2021   Gait disturbance 05/06/2021   Moderate obstructive sleep apnea 01/20/2021   Other fatigue 02/26/2020   Nocturnal leg cramps 06/29/2018   Aortic atherosclerosis (HCC) 11/08/2016   Thoracic aortic ectasia (HCC) 11/08/2016   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 02/28/2014 02/28/2014   Polyneuropathy in other diseases classified elsewhere (HCC) 09/24/2013   Memory loss 09/24/2013   Hypothyroidism, postsurgical 05/15/2013   HTN (hypertension) 05/15/2013   Vitamin D  deficiency 05/15/2013   Hyperlipidemia 05/15/2013   Osteopenia 10/25/2012   Trigeminal neuralgia 04/02/2011   GERD (gastroesophageal reflux disease) 07/23/2010   Anxiety state 05/24/2008   COPD (chronic obstructive pulmonary disease) (HCC) 05/24/2008   ARTHRITIS 05/24/2008   Idiopathic osteoporosis 05/24/2008   Malignant neoplasm of upper-outer quadrant of left female breast (HCC) 05/22/2008   History of colonic polyps 05/22/2008   Past Medical History:  Diagnosis Date   Anxiety state, unspecified    Arthritis    Arthropathy, unspecified, site unspecified    Carpal tunnel syndrome    Bilateral   Cataract    Right 7 Left eye remved cateracts   Chronic airway obstruction, not elsewhere classified    patient denies on 02/27/14   Esophageal reflux    Esophageal stricture    Hypertension    Internal hemorrhoids without  mention of complication    Malignant neoplasm of breast (female), unspecified site    left breast    Memory disorder 09/24/2013   Moderate obstructive sleep apnea 01/20/2021   Nocturnal leg cramps 06/29/2018   Osteopenia    Other and unspecified hyperlipidemia    Personal history of colonic polyps    adenomatous   Polyneuropathy in other diseases classified elsewhere (HCC) 09/24/2013   Shingles    Sleep apnea    no longer uses cpap   Stroke (HCC)    Tubular adenoma of colon    Unspecified hypothyroidism     Family History  Problem Relation Age of Onset   Bipolar disorder Sister    Early death Mother    Early death Sister    Colon cancer Neg Hx    Esophageal cancer Neg Hx    Rectal cancer Neg Hx    Stomach cancer Neg Hx     Past Surgical History:  Procedure Laterality Date  APPENDECTOMY  03/2014   BACK SURGERY     X2   BREAST LUMPECTOMY     COLONOSCOPY  08/03/2018   EYE SURGERY     cataracts bilateral   LAPAROSCOPIC APPENDECTOMY N/A 02/28/2014   Procedure: APPENDECTOMY LAPAROSCOPIC PARTIAL CECECTOMY FOR MUCOCELE OF APPENDIX ;  Surgeon: Elspeth Schultze, Spence;  Location: [facility];  Service: General;  Laterality: N/A;   MASTECTOMY     left   POLYPECTOMY     THYROIDECTOMY     TUBAL LIGATION     UPPER GASTROINTESTINAL ENDOSCOPY     Social History   Occupational History   Occupation: Retired    Associate Professor: RETIRED  Tobacco Use   Smoking status: Never   Smokeless tobacco: Never  Vaping Use   Vaping status: Never Used  Substance and Sexual Activity   Alcohol use: No    Alcohol/week: 0.0 standard drinks of alcohol   Drug use: No   Sexual activity: Never    Birth control/protection: Post-menopausal

## 2023-09-08 ENCOUNTER — Encounter: Payer: Self-pay | Admitting: Family Medicine

## 2023-09-12 ENCOUNTER — Other Ambulatory Visit: Payer: Self-pay | Admitting: Family Medicine

## 2023-09-12 DIAGNOSIS — E7800 Pure hypercholesterolemia, unspecified: Secondary | ICD-10-CM

## 2023-09-20 ENCOUNTER — Ambulatory Visit (INDEPENDENT_AMBULATORY_CARE_PROVIDER_SITE_OTHER): Admitting: Orthopaedic Surgery

## 2023-09-20 ENCOUNTER — Encounter: Payer: Self-pay | Admitting: Orthopaedic Surgery

## 2023-09-20 DIAGNOSIS — M1711 Unilateral primary osteoarthritis, right knee: Secondary | ICD-10-CM

## 2023-09-20 MED ORDER — SODIUM HYALURONATE 60 MG/3ML IX PRSY
60.0000 mg | PREFILLED_SYRINGE | INTRA_ARTICULAR | Status: AC | PRN
  Administered 2023-09-20: 60 mg via INTRA_ARTICULAR

## 2023-09-20 NOTE — Progress Notes (Signed)
   Procedure Note  Patient: [name]             Date of Birth: [birth year]           MRN: [id number]             Visit Date: 09/20/2023  Procedures: Visit Diagnoses:  1. Primary osteoarthritis of right knee     Large Joint Inj: R knee on 09/20/2023 8:53 AM Indications: pain Details: 22 G needle  Arthrogram: No  Medications: 60 mg Sodium Hyaluronate 60 MG/3ML Outcome: tolerated well, no immediate complications Patient was prepped and draped in the usual sterile fashion.

## 2023-10-05 ENCOUNTER — Ambulatory Visit: Admitting: Physician Assistant

## 2023-10-07 ENCOUNTER — Other Ambulatory Visit: Payer: Self-pay | Admitting: Family Medicine

## 2023-11-07 ENCOUNTER — Encounter: Payer: Self-pay | Admitting: Radiology

## 2023-12-20 ENCOUNTER — Ambulatory Visit: Admitting: Orthopaedic Surgery

## 2023-12-20 DIAGNOSIS — M1711 Unilateral primary osteoarthritis, right knee: Secondary | ICD-10-CM

## 2023-12-20 NOTE — Progress Notes (Signed)
 Office Visit Note   Patient: [name]           Date of Birth: 12-23-1940           MRN: [id number] Visit Date: 12/20/2023              Requested by: Maryanne Fonda LABOR, MD [address] [place],  [place] [address] PCP: Dettinger, Fonda LABOR, MD   Assessment & Plan: Visit Diagnoses:  1. Primary osteoarthritis of right knee     Plan: History of Present Illness Jadee Golebiewski is an 83 year old female with right knee osteoarthritis who presents for follow-up of persistent right knee pain and deformity.  She has persistent right knee pain with progressive deformity of the right lower extremity, mainly at the knee. She notes the leg is drawing inward toward the midline, causing impaired balance, stiffness, decreased knee flexibility, and difficulty with ambulation.  She received a right knee intra-articular hyaluronic acid injection in September 2025 with only transient relief. She uses acetaminophen  and asks about adding topical diclofenac  gel for further pain control.  Physical Exam MUSCULOSKELETAL: Right knee flexion contracture of 5 degrees.  Mild valgus deformity.  No joint effusion.  Assessment and Plan Primary osteoarthritis of right knee Chronic primary osteoarthritis with progressive lateral compartment disease causing pain, deformity, and functional limitations. Conservative management has limited efficacy. - Recommended continued acetaminophen  for analgesia. - Discussed topical diclofenac  for additional relief. - Provided DJO reaction knee brace for stability and symptom control. - Educated on chronic nature of osteoarthritis and management options.  Planned right total knee arthroplasty Considering right total knee arthroplasty for advanced osteoarthritis and deformity. Requires consideration of anesthesia risks and postoperative recovery. - Discussed total knee arthroplasty, anesthesia risks, and recovery. - Discussed need for medical clearance before surgery. - Discussed  90-day interval post-cortisone injection due to infection risk. - Provided educational handout on knee replacement surgery. - Encouraged consideration of risks, benefits, and readiness for surgery, emphasizing social support and mental preparation.  Will think about her options for now.    Follow-Up Instructions: No follow-ups on file.   Orders:  No orders of the defined types were placed in this encounter.  No orders of the defined types were placed in this encounter.     Procedures: No procedures performed   Clinical Data: No additional findings.   Subjective: Chief Complaint  Patient presents with   Right Knee - Pain    HPI  Review of Systems  Constitutional: Negative.   HENT: Negative.    Eyes: Negative.   Respiratory: Negative.    Cardiovascular: Negative.   Endocrine: Negative.   Musculoskeletal: Negative.   Neurological: Negative.   Hematological: Negative.   Psychiatric/Behavioral: Negative.    All other systems reviewed and are negative.    Objective: Vital Signs: LMP  (LMP Unknown)   Physical Exam Vitals and nursing note reviewed.  Constitutional:      Appearance: She is well-developed.  HENT:     Head: Atraumatic.     Nose: Nose normal.  Eyes:     Extraocular Movements: Extraocular movements intact.  Cardiovascular:     Pulses: Normal pulses.  Pulmonary:     Effort: Pulmonary effort is normal.  Abdominal:     Palpations: Abdomen is soft.  Musculoskeletal:     Cervical back: Neck supple.  Skin:    General: Skin is warm.     Capillary Refill: Capillary refill takes less than 2 seconds.  Neurological:  Mental Status: She is alert. Mental status is at baseline.  Psychiatric:        Behavior: Behavior normal.        Thought Content: Thought content normal.        Judgment: Judgment normal.     Ortho Exam  Specialty Comments:  No specialty comments available.  Imaging: No results found.   PMFS History: Patient Active  Problem List   Diagnosis Date Noted   Primary osteoarthritis of right knee 09/01/2023   Mixed stress and urge urinary incontinence 11/09/2022   Dry mouth 11/09/2022   Loss of consciousness (HCC) 10/12/2022   New daily persistent headache 10/12/2022   Bilateral low back pain with left-sided sciatica 10/12/2022   Low back pain 08/10/2022   OAB (overactive bladder) 12/07/2021   Gait disturbance 05/06/2021   Moderate obstructive sleep apnea 01/20/2021   Other fatigue 02/26/2020   Nocturnal leg cramps 06/29/2018   Aortic atherosclerosis 11/08/2016   Thoracic aortic ectasia 11/08/2016   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 02/28/2014 02/28/2014   Polyneuropathy in other diseases classified elsewhere (HCC) 09/24/2013   Memory loss 09/24/2013   Hypothyroidism, postsurgical 05/15/2013   HTN (hypertension) 05/15/2013   Vitamin D  deficiency 05/15/2013   Hyperlipidemia 05/15/2013   Osteopenia 10/25/2012   Trigeminal neuralgia 04/02/2011   GERD (gastroesophageal reflux disease) 07/23/2010   Anxiety state 05/24/2008   COPD (chronic obstructive pulmonary disease) (HCC) 05/24/2008   ARTHRITIS 05/24/2008   Idiopathic osteoporosis 05/24/2008   Malignant neoplasm of upper-outer quadrant of left female breast (HCC) 05/22/2008   History of colonic polyps 05/22/2008   Past Medical History:  Diagnosis Date   Anxiety state, unspecified    Arthritis    Arthropathy, unspecified, site unspecified    Carpal tunnel syndrome    Bilateral   Cataract    Right 7 Left eye remved cateracts   Chronic airway obstruction, not elsewhere classified    patient denies on 02/27/14   Esophageal reflux    Esophageal stricture    Hypertension    Internal hemorrhoids without mention of complication    Malignant neoplasm of breast (female), unspecified site    left breast    Memory disorder 09/24/2013   Moderate obstructive sleep apnea 01/20/2021   Nocturnal leg cramps 06/29/2018   Osteopenia     Other and unspecified hyperlipidemia    Personal history of colonic polyps    adenomatous   Polyneuropathy in other diseases classified elsewhere 09/24/2013   Shingles    Sleep apnea    no longer uses cpap   Stroke (HCC)    Tubular adenoma of colon    Unspecified hypothyroidism     Family History  Problem Relation Age of Onset   Bipolar disorder Sister    Early death Mother    Early death Sister    Colon cancer Neg Hx    Esophageal cancer Neg Hx    Rectal cancer Neg Hx    Stomach cancer Neg Hx     Past Surgical History:  Procedure Laterality Date   APPENDECTOMY  03/2014   BACK SURGERY     X2   BREAST LUMPECTOMY     COLONOSCOPY  08/03/2018   EYE SURGERY     cataracts bilateral   LAPAROSCOPIC APPENDECTOMY N/A 02/28/2014   Procedure: APPENDECTOMY LAPAROSCOPIC PARTIAL CECECTOMY FOR MUCOCELE OF APPENDIX ;  Surgeon: Elspeth Schultze, MD;  Location: [facility];  Service: General;  Laterality: N/A;   MASTECTOMY     left  POLYPECTOMY     THYROIDECTOMY     TUBAL LIGATION     UPPER GASTROINTESTINAL ENDOSCOPY     Social History   Occupational History   Occupation: Retired    Associate Professor: RETIRED  Tobacco Use   Smoking status: Never   Smokeless tobacco: Never  Vaping Use   Vaping status: Never Used  Substance and Sexual Activity   Alcohol use: No    Alcohol/week: 0.0 standard drinks of alcohol   Drug use: No   Sexual activity: Never    Birth control/protection: Post-menopausal

## 2024-01-06 ENCOUNTER — Ambulatory Visit: Payer: Self-pay

## 2024-01-06 NOTE — Telephone Encounter (Signed)
 FYI Only or Action Required?: Action required by provider: Refusing hospital, needs call back ASAP with further recommendations.  Patient was last seen in primary care on 08/17/2023 by Dettinger, Fonda LABOR, MD.  Called Nurse Triage reporting No chief complaint on file..  Symptoms began several days ago.  Interventions attempted: OTC medications: benadryl, Rest, hydration, or home remedies, and Other: called EMS the other day then refused hospital.  Symptoms are: rapidly worsening.  Triage Disposition: Call EMS 911 Now  Patient/caregiver understands and will follow disposition?: No, refuses disposition      Message from [facility] sent at 01/06/2024  8:48 AM EST:  Summary: Red face and burning   Reason for Triage: Patient stated for the last couple of days her face all down her neck has been blood red. Now its going down her back stopping at her shoulders. Her eyes are swollen. She states its stinging on her back and shoulders. She said she called the paramedics and they told her it was the strangest thing they have ever seen and would like to see someone. She's itching. She states its not urgent but its burning. Could you assist? Patients callback number is [phone].         Reason for Disposition  Sounds like a life-threatening emergency to the triager  Answer Assessment - Initial Assessment Questions This RN strongly advised pt go to ED via ambulance, pt refusing, requesting referral. Needs call back asap from PCP!   Just not as red Paramedics, checked everything I have, vitals, nothing was really off much but he was very concerned about this redness Hollows of eyes, blood red like was going to come out the skin EMS said to go to PCP EMS said would take to hospital, pt declined, EMS said need to call PCP Builds up during day Like whole face no spots left out, red Eyes just about mattered together No wound or surgery Been having to go to doc for leg for years,  brace Other night, first just eyes, one eye swollen together, face all swollen, called paramedics, and itching and burning, like had fire on me, did not scratch Some time trouble breathing, little bit COPD Have had this headache, always have headaches but this is sort of Got sort of woozy a day or two No new foods or something All neck down to shoulders no white other day Still got sting like gonna come up again Took 4 benadryl, eases off a bit then shows back up Skin really dry Don't want no ambulance, don't want to make big scene out of it Don't think it's gonna kill me today Thought PCP could refer me to someone at the hospital, lot of flu in the hospital, send me to skin doc or something that could see me right away, ain't gotta be today Refusing hospital Want a referral instead of hospital In my throat too Don't want to go to the hospital Not going to the ER Just want a specific person that's gonna see me right away Already burning First started in my eyes, itching, little spots of it 2 or 3 days before Had fell but didn't tell my family Had hit my head on the cabinet door, spot of my head up there Anyway don't want to go to the hospital It didn't have nothing to do with my head Need doctor to tell me if I need to be seen desperately Burning and red pain in head Gotta go to another doctor about my leg  Protocols used: Rash or Redness - [facility]

## 2024-01-06 NOTE — Telephone Encounter (Signed)
 Spoke with pt. She does not want an appt. She wants Dr. Maryanne to order a scan and schedule an appt for the scan. Informed pt that she will need to be seen for this to happen. Pt understood but still refused to schedule. Advised to go to hospital since she does not want to schedule. Pt states that she would do that.

## 2024-01-17 ENCOUNTER — Encounter: Payer: Self-pay | Admitting: Neurology

## 2024-01-17 ENCOUNTER — Ambulatory Visit (INDEPENDENT_AMBULATORY_CARE_PROVIDER_SITE_OTHER): Admitting: Neurology

## 2024-01-17 VITALS — BP 126/78 | HR 60 | Ht 69.0 in | Wt 156.0 lb

## 2024-01-17 DIAGNOSIS — F411 Generalized anxiety disorder: Secondary | ICD-10-CM | POA: Diagnosis not present

## 2024-01-17 DIAGNOSIS — G4733 Obstructive sleep apnea (adult) (pediatric): Secondary | ICD-10-CM

## 2024-01-17 DIAGNOSIS — R269 Unspecified abnormalities of gait and mobility: Secondary | ICD-10-CM

## 2024-01-17 DIAGNOSIS — R413 Other amnesia: Secondary | ICD-10-CM | POA: Diagnosis not present

## 2024-01-17 DIAGNOSIS — G4452 New daily persistent headache (NDPH): Secondary | ICD-10-CM | POA: Diagnosis not present

## 2024-01-17 MED ORDER — LAMOTRIGINE 25 MG PO TABS: 50.0000 mg | ORAL_TABLET | Freq: Two times a day (BID) | ORAL | 3 refills | Status: AC

## 2024-01-17 MED ORDER — DONEPEZIL HCL 10 MG PO TABS: 10.0000 mg | ORAL_TABLET | Freq: Every day | ORAL | 3 refills | Status: DC

## 2024-01-17 NOTE — Progress Notes (Signed)
 "   [name] 03/16/1940  ASSESSMENT AND PLAN 84 y.o. year old female     Chronic Headaches -Improved, mild headaches daily, pleased with regimen -Continue Lamictal  50 mg twice daily -Has not needed Fioricet  -Limit Tylenol  use, try not to treat headache more than 2-3 days a week to prevent rebound headache  -Follow up with Dr. Burnard about restarting CPAP  Memory Loss -Reported memory loss for approximately 4 years.  -MRI shows brain atrophy, superficial cytosis, old stroke -MMSE stable 29/30 (30/30) -Continue Aricept  10 mg daily  - Exercise, stay active  Gait Instability and Falls -Seeing orthopedics -Multifactorial, right knee pain -Dr. Onita ordered x-ray cervical, pelvis, lumbar spine showed degenerative changes  Urinary Incontinence - Chronic issue, follows with urology, better with Gemtesa   Sleep Apnea -Noncompliant, advised follow-up with cardiology since Dr. Burnard was previously managing. I think many of the above complaints can be improved with CPAP use.  -2023 had HST - Moderate obstructive sleep apnea occurred during this study (AHI 28.7/h). There is a significant positional component with supine sleep (421.8 mintes) AHI 32.2/h versus non-supine sleep (56.1 minutes) AHI 1.1/h. - Severe oxygen desaturation to a nadir of 73%. Time spent < 73 minutes was 34.1 minutes. - Patient snored for 112.2 minutes (23.8%) during the sleep  Follow-up in 1 year or sooner if needed  DIAGNOSTIC DATA (LABS, IMAGING, TESTING) - I reviewed patient records, labs, notes, testing and imaging myself where available.    HISTORY OF PRESENT ILLNESS: Discussed the use of AI scribe software for clinical note transcription with the patient, who gave verbal consent to proceed.  The patient, with a past medical history of breast cancer, thyroid  issues, and hypertension, presents with multiple complaints. The chief complaint is memory loss, which she has been experiencing for about three to four  years. She reports no family history of memory loss. The patient also reports frequent falls, which have increased in the past year. She has hit her head several times during these falls.  The patient also complains of daily headaches, which she describes as a pain above her eyes. The pain can range from a 6 to a 10 on a scale of 0 to 10. She has been taking over-the-counter Tylenol  to manage the pain, often exceeding the recommended dosage.  She also reports leg and back pain, which she describes as terrible. The pain in her legs has been affecting her walking and balance. She also reports bladder control issues, which have become desperate. She has to change her clothes four to five times a day due to urinary incontinence.  The patient lives alone and is quite active. She works in the yard and attends exercise classes at her church twice a week. However, her physical condition has been deteriorating, affecting her daily activities. She reports a decrease in hand strength, which has affected her ability to do tasks like rolling her hair or opening a can.   Update May 19, 2023 SS: EEG showed mild background slowing.  X-ray cervical spine, pelvis, lumbar showed degenerative changes. Here with her daughter. Dr. Onita started her on Lamictal  50 mg twice daily for headaches:  Headaches are better. Will take Fioricet for severe headache, 1-2 times a month. Mostly daily headache. Often wakes with headache in AM. Takes Tylenol  with good benefit. Right sided headache, sometimes nausea.  Memory MMSE 30/30. Lives alone, drives very short distances. Manages her lawn and household. Daughter is more involved.drove her lawnmower into the fence when it was  wet.   Falls: being very careful, her left leg is shorter. Few weeks ago fell in the yard.   CPAP not using CPAP, cardiology Dr. Burnard was using. Made her feel like suffocating.   Has pain to right leg, has had injection to the right knee. Starts in low back.  Went to ER in Jan, Vascular in Feb. Right knee is sore.   Sees urology for urinary incontinence. Better on Gemtesa .   Update 01/17/24 SS: Here with her daughter,  not been using CPAP, needs to follow up with Dr. Burnard. Lives alone, more mobility issues with right knee, knee replacement? Has cane. Remains on Lamictal  50 mg BID for headache, are better, most days mild headache, right sided. Lost hearing in her left ear. Takes 1 Tylenol  most days for variety of reasons. MMSE 29/30. Very short distance driving, not getting lost. Not doing as much yard work, mobility issues. Tracker on phone, car, keys. Misplace keys/phone. Managing her household. Strong willed about taking care of herself. Pays her bills. Gets distracted.   PHYSICAL EXAM Today's Vitals   01/17/24 0749  BP: 126/78  Pulse: 60  SpO2: 98%  Weight: 156 lb (70.8 kg)  Height: 5' 9 (1.753 m)   Body mass index is 23.04 kg/m.   Body mass index is 23.18 kg/m.    10/12/2022    9:19 AM 11/18/2021    9:35 AM 05/06/2021    9:06 AM  MMSE - Mini Mental State Exam  Orientation to time 4 5 4   Orientation to Place 5 5 5   Registration 3 3 3   Attention/ Calculation 4 4 5   Recall 3 3 3   Language- name 2 objects 2 2 2   Language- repeat 1 1 1   Language- follow 3 step command 3 3 3   Language- read & follow direction 1 1 1   Write a sentence 1 1 1   Copy design 1 1 1   Total score 28 29 29        No data to display         Generalized: Elderly female, awake, alert oriented to history taking and casual conversation conversation, looks tired today  Neurological examination  Mentation: Alert oriented to time, place, history taking. Follows all commands speech and language fluent Cranial nerve II-XII: Pupils were equal round reactive to light. Extraocular movements were full, visual field were full on confrontational test. Facial sensation and strength were normal.  Head turning and shoulder shrug  were normal and symmetric.  Motor: No  significant limb muscle weakness, pain to right knee Sensory: Intact to light touch bilaterally Coordination: No truncal ataxia or limb dysmetria noted, Gait and station: Push-up to get up from seated position, cautious, wide based no assistive device Reflexes: Deep tendon reflexes are present and symmetric      REVIEW OF SYSTEMS: Out of a complete 14 system review of symptoms, the patient complains only of the following symptoms, and all other reviewed systems are negative.    ALLERGIES: Allergies  Allergen Reactions   Atorvastatin Other (See Comments)    Muscle cramps   Meperidine Hcl Nausea And Vomiting    Makes me 'deathly' sick   Statins Other (See Comments)    REACTION: Patient cannot tolerate due cramps   Norco [Hydrocodone -Acetaminophen ] Nausea And Vomiting   Sulfonamide Derivatives Other (See Comments)    Unknown reaction    HOME MEDICATIONS: Outpatient Medications Prior to Visit  Medication Sig Dispense Refill   albuterol  (VENTOLIN  HFA) 108 (90 Base) MCG/ACT inhaler  Inhale 2 puffs into the lungs every 6 (six) hours as needed for wheezing or shortness of breath. 8 g 0   aspirin 81 MG tablet Take 81 mg by mouth daily.     busPIRone  (BUSPAR ) 30 MG tablet Take 0.5-1 tablets (15-30 mg total) by mouth daily as needed. TAKE (1/2) TO (1) TABLET BY MOUTH ONCE DAILY AS NEEDED 90 tablet 3   butalbital -acetaminophen -caffeine  (FIORICET) 50-325-40 MG tablet Take 1 tab at onset of migraine.  May repeat in 2 hrs, if needed.  Max dose: 2 tabs/day. This is a 30 day prescription. 12 tablet 5   Calcium Carbonate-Vitamin D  500-125 MG-UNIT TABS Take 1 capsule by mouth daily.     Cyanocobalamin  (VITAMIN B 12 PO) Take 1 tablet by mouth daily.      dimenhyDRINATE (DRAMAMINE) 50 MG tablet Take 50 mg by mouth every 8 (eight) hours as needed.     estradiol  (ESTRACE ) 0.1 MG/GM vaginal cream Discard plastic applicator. Insert a blueberry size amount (approximately 1 gram) of cream on fingertip inside  vagina at bedtime every night for 1 week then every other night. For long term use. 30 g 3   ezetimibe  (ZETIA ) 10 MG tablet Take 1 tablet (10 mg total) by mouth daily. **NEEDS TO BE SEEN BEFORE NEXT REFILL** 30 tablet 0   fluticasone  (FLONASE ) 50 MCG/ACT nasal spray Place 2 sprays into both nostrils daily. 16 g 6   ibuprofen  (ADVIL ,MOTRIN ) 200 MG tablet Take 800 mg by mouth every 6 (six) hours as needed (Pain).      levothyroxine  (SYNTHROID ) 175 MCG tablet TAKE ONE TABLET BY MOUTH DAILY. 90 tablet 1   lisinopril  (ZESTRIL ) 2.5 MG tablet Take 1 tablet (2.5 mg total) by mouth daily. 90 tablet 3   Multiple Vitamin (MULTIVITAMIN) capsule Take 1 capsule by mouth daily.     Omega-3 Fatty Acids (FISH OIL) 1000 MG CAPS Take 1,000 mg by mouth daily.     pantoprazole  (PROTONIX ) 20 MG tablet Take 1 tablet (20 mg total) by mouth daily. 90 tablet 3   Vibegron  (GEMTESA ) 75 MG TABS Take 1 tablet (75 mg total) by mouth daily. 90 tablet 3   donepezil  (ARICEPT ) 10 MG tablet TAKE ONE TABLET BY MOUTH AT BEDTIME. 90 tablet 3   lamoTRIgine  (LAMICTAL ) 25 MG tablet Take 2 tablets (50 mg total) by mouth 2 (two) times daily. 120 tablet 11   No facility-administered medications prior to visit.    PAST MEDICAL HISTORY: Past Medical History:  Diagnosis Date   Anxiety state, unspecified    Arthritis    Arthropathy, unspecified, site unspecified    Carpal tunnel syndrome    Bilateral   Cataract    Right 7 Left eye remved cateracts   Chronic airway obstruction, not elsewhere classified    patient denies on 02/27/14   Esophageal reflux    Esophageal stricture    Hypertension    Internal hemorrhoids without mention of complication    Malignant neoplasm of breast (female), unspecified site    left breast    Memory disorder 09/24/2013   Moderate obstructive sleep apnea 01/20/2021   Nocturnal leg cramps 06/29/2018   Osteopenia    Other and unspecified hyperlipidemia    Personal history of colonic polyps     adenomatous   Polyneuropathy in other diseases classified elsewhere 09/24/2013   Shingles    Sleep apnea    no longer uses cpap   Stroke (HCC)    Tubular adenoma of colon    Unspecified  hypothyroidism     PAST SURGICAL HISTORY: Past Surgical History:  Procedure Laterality Date   APPENDECTOMY  03/2014   BACK SURGERY     X2   BREAST LUMPECTOMY     COLONOSCOPY  08/03/2018   EYE SURGERY     cataracts bilateral   LAPAROSCOPIC APPENDECTOMY N/A 02/28/2014   Procedure: APPENDECTOMY LAPAROSCOPIC PARTIAL CECECTOMY FOR MUCOCELE OF APPENDIX ;  Surgeon: Elspeth Schultze, MD;  Location: [facility];  Service: General;  Laterality: N/A;   MASTECTOMY     left   POLYPECTOMY     THYROIDECTOMY     TUBAL LIGATION     UPPER GASTROINTESTINAL ENDOSCOPY      FAMILY HISTORY: Family History  Problem Relation Age of Onset   Bipolar disorder Sister    Early death Mother    Early death Sister    Colon cancer Neg Hx    Esophageal cancer Neg Hx    Rectal cancer Neg Hx    Stomach cancer Neg Hx     SOCIAL HISTORY: Social History   Socioeconomic History   Marital status: Divorced    Spouse name: Not on file   Number of children: 2   Years of education: Not on file   Highest education level: Not on file  Occupational History   Occupation: Retired    Associate Professor: RETIRED  Tobacco Use   Smoking status: Never   Smokeless tobacco: Never  Vaping Use   Vaping status: Never Used  Substance and Sexual Activity   Alcohol use: No    Alcohol/week: 0.0 standard drinks of alcohol   Drug use: No   Sexual activity: Never    Birth control/protection: Post-menopausal  Other Topics Concern   Not on file  Social History Narrative   Patient lives at home alone and she is divorced.   Retired.   Education business course.   Right handed.   Caffeine  sometimes tea.    Social Drivers of Health   Tobacco Use: Low Risk (01/17/2024)   Patient History    Smoking Tobacco Use: Never    Smokeless Tobacco Use: Never     Passive Exposure: Not on file  Financial Resource Strain: Not on file  Food Insecurity: No Food Insecurity (07/02/2021)   Received from [facility]   Epic    Within the past 12 months, you worried that your food would run out before you got the money to buy more.: Never true    Within the past 12 months, the food you bought just didn't last and you didn't have money to get more.: Never true  Transportation Needs: Not on file  Physical Activity: Not on file  Stress: Not on file  Social Connections: Not on file  Intimate Partner Violence: Not on file  Depression (PHQ2-9): Medium Risk (08/17/2023)   Depression (PHQ2-9)    PHQ-2 Score: 8  Alcohol Screen: Not on file  Housing: Not on file  Utilities: Not on file  Health Literacy: Not on file   Lauraine Gayland MANDES, DNP  [facility] [address] [place], [place] [address] [phone]  "

## 2024-01-17 NOTE — Patient Instructions (Addendum)
 Follow up with Dr. Burnard about CPAP, would recommend using CPAP nightly.  I think it could benefit your headaches and memory.  Continue Lamictal  for headache prevention.  Limit Tylenol  use, ideally no more than 2 to 3 days a week to prevent rebound headache.  Continue Aricept  for memory.  Stay active, exercise.  Call for worsening symptoms.  Follow-up 1 year.  Thanks

## 2024-02-03 ENCOUNTER — Other Ambulatory Visit: Payer: Self-pay | Admitting: Neurology

## 2024-03-23 ENCOUNTER — Ambulatory Visit: Admitting: Family Medicine

## 2025-01-16 ENCOUNTER — Ambulatory Visit: Admitting: Neurology
